# Patient Record
Sex: Female | Born: 1960 | Hispanic: Refuse to answer | Marital: Married
Health system: Midwestern US, Community
[De-identification: ages and names within clinical notes are randomized; demographics above are authoritative.]

## PROBLEM LIST (undated history)

## (undated) DIAGNOSIS — K219 Gastro-esophageal reflux disease without esophagitis: Secondary | ICD-10-CM

## (undated) DIAGNOSIS — M722 Plantar fascial fibromatosis: Secondary | ICD-10-CM

## (undated) DIAGNOSIS — Z720 Tobacco use: Secondary | ICD-10-CM

## (undated) DIAGNOSIS — G629 Polyneuropathy, unspecified: Secondary | ICD-10-CM

## (undated) DIAGNOSIS — J40 Bronchitis, not specified as acute or chronic: Secondary | ICD-10-CM

## (undated) DIAGNOSIS — K5792 Diverticulitis of intestine, part unspecified, without perforation or abscess without bleeding: Secondary | ICD-10-CM

## (undated) DIAGNOSIS — F419 Anxiety disorder, unspecified: Secondary | ICD-10-CM

## (undated) DIAGNOSIS — E119 Type 2 diabetes mellitus without complications: Secondary | ICD-10-CM

## (undated) DIAGNOSIS — F1910 Other psychoactive substance abuse, uncomplicated: Secondary | ICD-10-CM

## (undated) DIAGNOSIS — R51 Headache: Secondary | ICD-10-CM

## (undated) DIAGNOSIS — R519 Headache, unspecified: Secondary | ICD-10-CM

## (undated) DIAGNOSIS — M797 Fibromyalgia: Secondary | ICD-10-CM

## (undated) DIAGNOSIS — G5603 Carpal tunnel syndrome, bilateral upper limbs: Secondary | ICD-10-CM

## (undated) DIAGNOSIS — E785 Hyperlipidemia, unspecified: Secondary | ICD-10-CM

## (undated) HISTORY — DX: Diverticulitis of intestine, part unspecified, without perforation or abscess without bleeding: K57.92

## (undated) HISTORY — DX: Other psychoactive substance abuse, uncomplicated: F19.10

## (undated) HISTORY — DX: Carpal tunnel syndrome, bilateral upper limbs: G56.03

## (undated) HISTORY — DX: Fibromyalgia: M79.7

## (undated) HISTORY — PX: OTHER SURGICAL HISTORY: SHX169

## (undated) HISTORY — PX: ANKLE SURGERY: SHX546

## (undated) HISTORY — DX: Anxiety disorder, unspecified: F41.9

## (undated) HISTORY — DX: Polyneuropathy, unspecified: G62.9

## (undated) HISTORY — DX: Hyperlipidemia, unspecified: E78.5

## (undated) HISTORY — DX: Plantar fascial fibromatosis: M72.2

## (undated) HISTORY — PX: EYE SURGERY: SHX253

## (undated) HISTORY — PX: BACK SURGERY: SHX140

## (undated) HISTORY — PX: CARPAL TUNNEL RELEASE: SHX101

## (undated) HISTORY — PX: ECTOPIC PREGNANCY SURGERY: SHX613

---
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
## 2000-11-03 ENCOUNTER — Encounter: Payer: Self-pay | Admitting: Emergency Medicine

## 2000-11-03 ENCOUNTER — Inpatient Hospital Stay (HOSPITAL_COMMUNITY): Admission: EM | Admit: 2000-11-03 | Discharge: 2000-11-06 | Payer: Self-pay | Admitting: *Deleted

## 2000-11-06 ENCOUNTER — Encounter: Payer: Self-pay | Admitting: Emergency Medicine

## 2000-11-06 ENCOUNTER — Inpatient Hospital Stay (HOSPITAL_COMMUNITY): Admission: EM | Admit: 2000-11-06 | Discharge: 2000-11-16 | Payer: Self-pay | Admitting: Emergency Medicine

## 2000-11-07 ENCOUNTER — Encounter: Payer: Self-pay | Admitting: Family Medicine

## 2000-11-08 ENCOUNTER — Encounter: Payer: Self-pay | Admitting: Internal Medicine

## 2000-11-08 ENCOUNTER — Encounter: Payer: Self-pay | Admitting: Family Medicine

## 2000-11-14 ENCOUNTER — Encounter: Payer: Self-pay | Admitting: Family Medicine

## 2000-11-14 ENCOUNTER — Encounter (HOSPITAL_BASED_OUTPATIENT_CLINIC_OR_DEPARTMENT_OTHER): Payer: Self-pay | Admitting: General Surgery

## 2001-09-16 ENCOUNTER — Emergency Department (HOSPITAL_COMMUNITY): Admission: EM | Admit: 2001-09-16 | Discharge: 2001-09-16 | Payer: Self-pay | Admitting: Emergency Medicine

## 2002-01-08 ENCOUNTER — Ambulatory Visit (HOSPITAL_COMMUNITY): Admission: RE | Admit: 2002-01-08 | Discharge: 2002-01-08 | Payer: Self-pay | Admitting: Obstetrics and Gynecology

## 2002-01-08 ENCOUNTER — Encounter: Payer: Self-pay | Admitting: Obstetrics and Gynecology

## 2002-09-10 ENCOUNTER — Encounter: Payer: Self-pay | Admitting: Obstetrics and Gynecology

## 2002-09-10 ENCOUNTER — Ambulatory Visit (HOSPITAL_COMMUNITY): Admission: RE | Admit: 2002-09-10 | Discharge: 2002-09-10 | Payer: Self-pay | Admitting: Obstetrics and Gynecology

## 2004-02-23 ENCOUNTER — Encounter (INDEPENDENT_AMBULATORY_CARE_PROVIDER_SITE_OTHER): Payer: Self-pay | Admitting: Specialist

## 2004-02-23 ENCOUNTER — Observation Stay (HOSPITAL_COMMUNITY): Admission: RE | Admit: 2004-02-23 | Discharge: 2004-02-24 | Payer: Self-pay | Admitting: Specialist

## 2004-09-28 ENCOUNTER — Ambulatory Visit: Payer: Self-pay | Admitting: Internal Medicine

## 2005-08-17 ENCOUNTER — Ambulatory Visit: Payer: Self-pay | Admitting: Internal Medicine

## 2005-08-22 ENCOUNTER — Ambulatory Visit: Payer: Self-pay | Admitting: Internal Medicine

## 2005-09-05 ENCOUNTER — Encounter: Admission: RE | Admit: 2005-09-05 | Discharge: 2005-09-05 | Payer: Self-pay | Admitting: Obstetrics and Gynecology

## 2005-11-03 ENCOUNTER — Ambulatory Visit: Payer: Self-pay | Admitting: Family Medicine

## 2006-09-09 ENCOUNTER — Ambulatory Visit (HOSPITAL_COMMUNITY): Admission: RE | Admit: 2006-09-09 | Discharge: 2006-09-09 | Payer: Self-pay | Admitting: Internal Medicine

## 2006-09-25 ENCOUNTER — Emergency Department (HOSPITAL_COMMUNITY): Admission: EM | Admit: 2006-09-25 | Discharge: 2006-09-25 | Payer: Self-pay | Admitting: Emergency Medicine

## 2006-10-04 ENCOUNTER — Emergency Department (HOSPITAL_COMMUNITY): Admission: EM | Admit: 2006-10-04 | Discharge: 2006-10-04 | Payer: Self-pay | Admitting: Emergency Medicine

## 2006-10-11 ENCOUNTER — Ambulatory Visit: Payer: Self-pay | Admitting: Internal Medicine

## 2006-10-11 LAB — CONVERTED CEMR LAB
ALT: 21 units/L (ref 0–40)
AST: 23 units/L (ref 0–37)
Albumin: 3.5 g/dL (ref 3.5–5.2)
Alkaline Phosphatase: 86 units/L (ref 39–117)
Amylase: 72 units/L (ref 27–131)
BUN: 14 mg/dL (ref 6–23)
Basophils Absolute: 0.1 10*3/uL (ref 0.0–0.1)
Basophils Relative: 0.8 % (ref 0.0–1.0)
Bilirubin Urine: NEGATIVE
Bilirubin, Direct: 0.1 mg/dL (ref 0.0–0.3)
CO2: 29 meq/L (ref 19–32)
Calcium: 9 mg/dL (ref 8.4–10.5)
Chloride: 109 meq/L (ref 96–112)
Cholesterol: 195 mg/dL (ref 0–200)
Creatinine, Ser: 0.8 mg/dL (ref 0.4–1.2)
Direct LDL: 106.4 mg/dL
Eosinophils Absolute: 0.2 10*3/uL (ref 0.0–0.6)
Eosinophils Relative: 3.2 % (ref 0.0–5.0)
GFR calc Af Amer: 99 mL/min
GFR calc non Af Amer: 82 mL/min
Glucose, Bld: 95 mg/dL (ref 70–99)
H Pylori IgG: NEGATIVE
HCT: 37.7 % (ref 36.0–46.0)
HDL: 54.9 mg/dL (ref >39.0)
Hemoglobin, Urine: NEGATIVE
Hemoglobin: 12.6 g/dL (ref 12.0–15.0)
Ketones, ur: NEGATIVE mg/dL
Leukocytes, UA: NEGATIVE
Lipase: 21 units/L (ref 11.0–59.0)
Lymphocytes Relative: 35.1 % (ref 12.0–46.0)
MCHC: 33.3 g/dL (ref 30.0–36.0)
MCV: 82.5 fL (ref 78.0–100.0)
Monocytes Absolute: 0.6 10*3/uL (ref 0.2–0.7)
Monocytes Relative: 9.7 % (ref 3.0–11.0)
Neutro Abs: 3.3 10*3/uL (ref 1.4–7.7)
Neutrophils Relative %: 51.2 % (ref 43.0–77.0)
Nitrite: NEGATIVE
Platelets: 259 10*3/uL (ref 150–400)
Potassium: 3.7 meq/L (ref 3.5–5.1)
RBC: 4.57 M/uL (ref 3.87–5.11)
RDW: 14.2 % (ref 11.5–14.6)
Sodium: 143 meq/L (ref 135–145)
Specific Gravity, Urine: >=1.03 (ref 1.000–1.03)
TSH: 0.96 microintl units/mL (ref 0.35–5.50)
Total Bilirubin: 0.6 mg/dL (ref 0.3–1.2)
Total CHOL/HDL Ratio: 3.6
Total Protein: 6.6 g/dL (ref 6.0–8.3)
Triglycerides: 202 mg/dL (ref 0–149)
Urine Glucose: NEGATIVE mg/dL
Urobilinogen, UA: 0.2 (ref 0.0–1.0)
VLDL: 40 mg/dL (ref 0–40)
WBC: 6.5 10*3/uL (ref 4.5–10.5)
pH: 6 (ref 5.0–8.0)

## 2006-12-26 DIAGNOSIS — K219 Gastro-esophageal reflux disease without esophagitis: Secondary | ICD-10-CM | POA: Insufficient documentation

## 2006-12-26 DIAGNOSIS — F329 Major depressive disorder, single episode, unspecified: Secondary | ICD-10-CM | POA: Insufficient documentation

## 2006-12-26 DIAGNOSIS — F3289 Other specified depressive episodes: Secondary | ICD-10-CM | POA: Insufficient documentation

## 2008-02-01 ENCOUNTER — Emergency Department (HOSPITAL_COMMUNITY): Admission: EM | Admit: 2008-02-01 | Discharge: 2008-02-01 | Payer: Self-pay | Admitting: Emergency Medicine

## 2008-02-03 ENCOUNTER — Encounter: Payer: Self-pay | Admitting: Internal Medicine

## 2008-02-09 ENCOUNTER — Ambulatory Visit (HOSPITAL_COMMUNITY): Admission: RE | Admit: 2008-02-09 | Discharge: 2008-02-10 | Payer: Self-pay | Admitting: Orthopaedic Surgery

## 2008-03-05 ENCOUNTER — Ambulatory Visit (HOSPITAL_BASED_OUTPATIENT_CLINIC_OR_DEPARTMENT_OTHER): Admission: RE | Admit: 2008-03-05 | Discharge: 2008-03-05 | Payer: Self-pay | Admitting: Orthopaedic Surgery

## 2008-08-19 ENCOUNTER — Ambulatory Visit: Payer: Self-pay | Admitting: Family Medicine

## 2008-08-19 LAB — CONVERTED CEMR LAB
ALT: 18 units/L (ref 0–35)
AST: 13 units/L (ref 0–37)
Albumin: 4.2 g/dL (ref 3.5–5.2)
Alkaline Phosphatase: 103 units/L (ref 39–117)
BUN: 11 mg/dL (ref 6–23)
Band Neutrophils: 0 % (ref 0–10)
Basophils Absolute: 0 10*3/uL (ref 0.0–0.1)
Basophils Relative: 1 % (ref 0–1)
CO2: 24 meq/L (ref 19–32)
Calcium: 9.2 mg/dL (ref 8.4–10.5)
Chloride: 105 meq/L (ref 96–112)
Cholesterol: 209 mg/dL — ABNORMAL HIGH (ref 0–200)
Creatinine, Ser: 0.78 mg/dL (ref 0.40–1.20)
Eosinophils Absolute: 0.1 10*3/uL (ref 0.0–0.7)
Eosinophils Relative: 2 % (ref 0–5)
Free T4: 1.05 ng/dL (ref 0.89–1.80)
Glucose, Bld: 98 mg/dL (ref 70–99)
HCT: 41.6 % (ref 36.0–46.0)
HDL: 61 mg/dL (ref >39)
Hemoglobin: 12.8 g/dL (ref 12.0–15.0)
LDL Cholesterol: 114 mg/dL — ABNORMAL HIGH (ref 0–99)
Lymphocytes Relative: 40 % (ref 12–46)
Lymphs Abs: 1.8 10*3/uL (ref 0.7–4.0)
MCHC: 30.8 g/dL (ref 30.0–36.0)
MCV: 84.4 fL (ref 78.0–100.0)
Monocytes Absolute: 0.5 10*3/uL (ref 0.1–1.0)
Monocytes Relative: 10 % (ref 3–12)
Neutro Abs: 2.2 10*3/uL (ref 1.7–7.7)
Neutrophils Relative %: 47 % (ref 43–77)
Platelets: 243 10*3/uL (ref 150–400)
Potassium: 4 meq/L (ref 3.5–5.3)
RBC: 4.93 M/uL (ref 3.87–5.11)
RDW: 16.1 % — ABNORMAL HIGH (ref 11.5–15.5)
Sodium: 140 meq/L (ref 135–145)
T3, Total: 130.2 ng/dL (ref 80.0–204.0)
TSH: 0.96 microintl units/mL (ref 0.350–4.500)
Total Bilirubin: 0.3 mg/dL (ref 0.3–1.2)
Total CHOL/HDL Ratio: 3.4
Total Protein: 7.1 g/dL (ref 6.0–8.3)
Triglycerides: 168 mg/dL — ABNORMAL HIGH (ref <150)
VLDL: 34 mg/dL (ref 0–40)
WBC: 4.6 10*3/uL (ref 4.0–10.5)

## 2008-08-31 ENCOUNTER — Ambulatory Visit: Payer: Self-pay | Admitting: Family Medicine

## 2008-09-07 ENCOUNTER — Ambulatory Visit (HOSPITAL_COMMUNITY): Admission: RE | Admit: 2008-09-07 | Discharge: 2008-09-07 | Payer: Self-pay | Admitting: Family Medicine

## 2008-12-16 ENCOUNTER — Ambulatory Visit: Payer: Self-pay | Admitting: Family Medicine

## 2008-12-22 ENCOUNTER — Ambulatory Visit (HOSPITAL_COMMUNITY): Admission: RE | Admit: 2008-12-22 | Discharge: 2008-12-22 | Payer: Self-pay | Admitting: Family Medicine

## 2008-12-28 ENCOUNTER — Ambulatory Visit: Payer: Self-pay | Admitting: *Deleted

## 2009-01-21 ENCOUNTER — Ambulatory Visit: Payer: Self-pay | Admitting: Family Medicine

## 2009-03-15 ENCOUNTER — Ambulatory Visit: Payer: Self-pay | Admitting: Family Medicine

## 2009-03-15 ENCOUNTER — Telehealth (INDEPENDENT_AMBULATORY_CARE_PROVIDER_SITE_OTHER): Payer: Self-pay | Admitting: *Deleted

## 2009-03-23 ENCOUNTER — Encounter: Payer: Self-pay | Admitting: Obstetrics and Gynecology

## 2009-03-23 ENCOUNTER — Ambulatory Visit: Payer: Self-pay | Admitting: Obstetrics and Gynecology

## 2009-03-23 LAB — CONVERTED CEMR LAB
FSH: 49.9 milliintl units/mL
LH: 45.5 milliintl units/mL

## 2009-03-29 ENCOUNTER — Ambulatory Visit (HOSPITAL_COMMUNITY): Admission: RE | Admit: 2009-03-29 | Discharge: 2009-03-29 | Payer: Self-pay | Admitting: Obstetrics & Gynecology

## 2009-03-29 ENCOUNTER — Ambulatory Visit: Payer: Self-pay | Admitting: Family Medicine

## 2009-03-29 ENCOUNTER — Telehealth (INDEPENDENT_AMBULATORY_CARE_PROVIDER_SITE_OTHER): Payer: Self-pay | Admitting: *Deleted

## 2009-04-08 ENCOUNTER — Ambulatory Visit: Payer: Self-pay | Admitting: Obstetrics & Gynecology

## 2009-04-21 ENCOUNTER — Ambulatory Visit (HOSPITAL_COMMUNITY): Admission: RE | Admit: 2009-04-21 | Discharge: 2009-04-21 | Payer: Self-pay | Admitting: Family Medicine

## 2009-04-21 ENCOUNTER — Ambulatory Visit: Admission: RE | Admit: 2009-04-21 | Discharge: 2009-04-21 | Payer: Self-pay | Admitting: Gynecologic Oncology

## 2009-04-21 ENCOUNTER — Encounter: Payer: Self-pay | Admitting: Gynecologic Oncology

## 2009-05-03 ENCOUNTER — Telehealth (INDEPENDENT_AMBULATORY_CARE_PROVIDER_SITE_OTHER): Payer: Self-pay | Admitting: *Deleted

## 2009-05-04 ENCOUNTER — Ambulatory Visit: Payer: Self-pay | Admitting: Internal Medicine

## 2009-08-09 ENCOUNTER — Ambulatory Visit: Payer: Self-pay | Admitting: Family Medicine

## 2009-09-06 ENCOUNTER — Ambulatory Visit: Payer: Self-pay | Admitting: Family Medicine

## 2009-09-07 ENCOUNTER — Encounter (INDEPENDENT_AMBULATORY_CARE_PROVIDER_SITE_OTHER): Payer: Self-pay | Admitting: Adult Health

## 2009-09-27 ENCOUNTER — Ambulatory Visit (HOSPITAL_COMMUNITY): Admission: RE | Admit: 2009-09-27 | Discharge: 2009-09-27 | Payer: Self-pay | Admitting: Gynecologic Oncology

## 2009-09-29 ENCOUNTER — Ambulatory Visit: Admission: RE | Admit: 2009-09-29 | Discharge: 2009-09-29 | Payer: Self-pay | Admitting: Gynecologic Oncology

## 2009-11-11 ENCOUNTER — Ambulatory Visit: Payer: Self-pay | Admitting: Family Medicine

## 2009-11-11 LAB — CONVERTED CEMR LAB
ALT: 20 units/L (ref 0–35)
AST: 19 units/L (ref 0–37)
Albumin: 4.3 g/dL (ref 3.5–5.2)
Alkaline Phosphatase: 107 units/L (ref 39–117)
BUN: 15 mg/dL (ref 6–23)
Basophils Absolute: 0 10*3/uL (ref 0.0–0.1)
Basophils Relative: 1 % (ref 0–1)
CO2: 22 meq/L (ref 19–32)
Calcium: 9.3 mg/dL (ref 8.4–10.5)
Chloride: 102 meq/L (ref 96–112)
Cholesterol: 211 mg/dL — ABNORMAL HIGH (ref 0–200)
Creatinine, Ser: 0.7 mg/dL (ref 0.40–1.20)
Eosinophils Absolute: 0.1 10*3/uL (ref 0.0–0.7)
Eosinophils Relative: 2 % (ref 0–5)
Glucose, Bld: 126 mg/dL — ABNORMAL HIGH (ref 70–99)
HCT: 41.2 % (ref 36.0–46.0)
HDL: 64 mg/dL (ref >39)
Helicobacter Pylori Antibody-IgG: 0.8
Hemoglobin: 13.1 g/dL (ref 12.0–15.0)
LDL Cholesterol: 117 mg/dL — ABNORMAL HIGH (ref 0–99)
Lymphocytes Relative: 41 % (ref 12–46)
Lymphs Abs: 2.3 10*3/uL (ref 0.7–4.0)
MCHC: 31.8 g/dL (ref 30.0–36.0)
MCV: 81.1 fL (ref 78.0–100.0)
Monocytes Absolute: 0.5 10*3/uL (ref 0.1–1.0)
Monocytes Relative: 8 % (ref 3–12)
Neutro Abs: 2.7 10*3/uL (ref 1.7–7.7)
Neutrophils Relative %: 48 % (ref 43–77)
Platelets: 271 10*3/uL (ref 150–400)
Potassium: 4.2 meq/L (ref 3.5–5.3)
RBC: 5.08 M/uL (ref 3.87–5.11)
RDW: 15.6 % — ABNORMAL HIGH (ref 11.5–15.5)
Sodium: 135 meq/L (ref 135–145)
Total Bilirubin: 0.3 mg/dL (ref 0.3–1.2)
Total CHOL/HDL Ratio: 3.3
Total Protein: 7.3 g/dL (ref 6.0–8.3)
Triglycerides: 151 mg/dL — ABNORMAL HIGH (ref <150)
VLDL: 30 mg/dL (ref 0–40)
Vit D, 25-Hydroxy: 33 ng/mL (ref 30–89)
WBC: 5.6 10*3/uL (ref 4.0–10.5)

## 2009-12-01 ENCOUNTER — Ambulatory Visit: Payer: Self-pay | Admitting: Obstetrics & Gynecology

## 2009-12-01 ENCOUNTER — Ambulatory Visit (HOSPITAL_COMMUNITY): Admission: RE | Admit: 2009-12-01 | Discharge: 2009-12-01 | Payer: Self-pay | Admitting: Family Medicine

## 2009-12-01 LAB — CONVERTED CEMR LAB
Trich, Wet Prep: NONE SEEN
WBC, Wet Prep HPF POC: NONE SEEN
Yeast Wet Prep HPF POC: NONE SEEN

## 2009-12-02 ENCOUNTER — Encounter: Payer: Self-pay | Admitting: Obstetrics & Gynecology

## 2009-12-02 LAB — CONVERTED CEMR LAB
Chlamydia, DNA Probe: NEGATIVE
GC Probe Amp, Genital: NEGATIVE

## 2009-12-26 ENCOUNTER — Ambulatory Visit: Payer: Self-pay | Admitting: Obstetrics & Gynecology

## 2009-12-26 ENCOUNTER — Ambulatory Visit (HOSPITAL_COMMUNITY): Admission: RE | Admit: 2009-12-26 | Discharge: 2009-12-26 | Payer: Self-pay | Admitting: Obstetrics & Gynecology

## 2010-01-23 ENCOUNTER — Encounter (INDEPENDENT_AMBULATORY_CARE_PROVIDER_SITE_OTHER): Payer: Self-pay | Admitting: *Deleted

## 2010-01-23 ENCOUNTER — Ambulatory Visit: Payer: Self-pay | Admitting: Obstetrics & Gynecology

## 2010-03-09 ENCOUNTER — Encounter (INDEPENDENT_AMBULATORY_CARE_PROVIDER_SITE_OTHER): Payer: Self-pay | Admitting: *Deleted

## 2010-03-09 ENCOUNTER — Ambulatory Visit: Payer: Self-pay | Admitting: Internal Medicine

## 2010-03-09 DIAGNOSIS — R141 Gas pain: Secondary | ICD-10-CM | POA: Insufficient documentation

## 2010-03-09 DIAGNOSIS — R143 Flatulence: Secondary | ICD-10-CM | POA: Insufficient documentation

## 2010-03-09 DIAGNOSIS — R142 Eructation: Secondary | ICD-10-CM

## 2010-03-09 DIAGNOSIS — R1319 Other dysphagia: Secondary | ICD-10-CM | POA: Insufficient documentation

## 2010-03-09 DIAGNOSIS — D259 Leiomyoma of uterus, unspecified: Secondary | ICD-10-CM | POA: Insufficient documentation

## 2010-03-09 DIAGNOSIS — R12 Heartburn: Secondary | ICD-10-CM | POA: Insufficient documentation

## 2010-03-09 HISTORY — DX: Leiomyoma of uterus, unspecified: D25.9

## 2010-03-23 ENCOUNTER — Ambulatory Visit: Payer: Self-pay | Admitting: Internal Medicine

## 2010-03-28 ENCOUNTER — Telehealth: Payer: Self-pay | Admitting: Internal Medicine

## 2010-03-30 ENCOUNTER — Telehealth: Payer: Self-pay | Admitting: Internal Medicine

## 2010-07-02 ENCOUNTER — Encounter: Payer: Self-pay | Admitting: Orthopedic Surgery

## 2010-07-11 NOTE — Progress Notes (Signed)
Summary: Paperwork  Phone Note Call from Patient Call back at [facility] Phone [phone]   Caller: Patient Call For: Dr. Leone Payor Reason for Call: Talk to Nurse Summary of Call: pt. wants to know if you received a fax about [employer] Initial call taken by: Karna Christmas,  March 28, 2010 9:50 AM  Follow-up for Phone Call        Per Elease Hashimoto in MR, she does have forms, but she needs HIPPA form signed by pt.  She will mail this form.  Pt is notified that Elease Hashimoto will mail her form to sign and return to [place].  Pt is agreeable. Follow-up by: Francee Piccolo CMA Duncan Dull),  March 28, 2010 11:22 AM

## 2010-07-11 NOTE — Progress Notes (Signed)
Summary: form ?'s   Phone Note Call from Patient Call back at Home Phone [phone]   Caller: Patient Call For: Dr. Leone Payor Reason for Call: Talk to Nurse Summary of Call: has questions regarding accident claim form and $25 fee Initial call taken by: Vallarie Mare,  March 30, 2010 1:51 PM  Follow-up for Phone Call        I advised pt that [place] has a blanket fee for all forms to be filled out and there really is nothing I can do about it.  Pt is agreeable. Follow-up by: Francee Piccolo CMA Duncan Dull),  March 30, 2010 3:47 PM

## 2010-07-11 NOTE — Procedures (Signed)
Summary: Colonoscopy  Patient: Alyssa Montgomery Note: All result statuses are Final unless otherwise noted.  Tests: (1) Colonoscopy (COL)   COL Colonoscopy           DONE     [facility]     [address]     [place], [place]  [address]           COLONOSCOPY PROCEDURE REPORT           PATIENT:  [name], [name]  MR#:  [id number]     BIRTHDATE:  12/22/1960, 49 yrs. old  GENDER:  female     ENDOSCOPIST:  Iva Boop, MD, [facility]           PROCEDURE DATE:  03/23/2010     PROCEDURE:  Colonoscopy 21308     ASA CLASS:  Class II     INDICATIONS:  Routine Risk Screening     MEDICATIONS:   Fentanyl 25 mcg IV, Versed 3 mg IV, There was     residual sedation effect present from prior procedure.           DESCRIPTION OF PROCEDURE:   After the risks benefits and     alternatives of the procedure were thoroughly explained, informed     consent was obtained.  No rectal exam performed. The LB 180AL     [id number] endoscope was introduced through the anus and advanced to     the cecum, which was identified by both the appendix and ileocecal     valve, without limitations.  The quality of the prep was     excellent, using MoviPrep.  The instrument was then slowly     withdrawn as the colon was fully examined. insertion: 2:33 minutes     and Withdrawal: 8:00 minutes     <<PROCEDUREIMAGES>>           FINDINGS:  Mild diverticulosis was found in the sigmoid colon.     This was otherwise a normal examination of the colon.   Retroflexed     views in the rectum revealed no abnormalities.    The scope was     then withdrawn from the patient and the procedure completed.           COMPLICATIONS:  None     ENDOSCOPIC IMPRESSION:     1) Mild diverticulosis in the sigmoid colon     2) Otherwise normal examination, excellent prep           REPEAT EXAM:  In 10 year(s) for routine screening colonoscopy.           Iva Boop, MD, [facility]           CC:  Dow Adolph, MD     The Patient          n.     eSIGNED:   Iva Boop at 03/23/2010 04:19 PM           Butch Penny, [id number]  Note: An exclamation mark (!) indicates a result that was not dispersed into the flowsheet. Document Creation Date: 03/23/2010 4:19 PM _______________________________________________________________________  (1) Order result status: Final Collection or observation date-time: 03/23/2010 15:58 Requested date-time:  Receipt date-time:  Reported date-time:  Referring Physician:   Ordering Physician: Stan Head [id number]) Specimen Source:  Source: Launa Grill Order Number: [id number] Lab site:   Appended Document: Colonoscopy    Clinical Lists Changes  Observations: Added new observation of COLONNXTDUE: 03/2020 (03/23/2010 16:25)

## 2010-07-11 NOTE — Procedures (Signed)
Summary: Upper Endoscopy  Patient: Alyssa Montgomery Note: All result statuses are Final unless otherwise noted.  Tests: (1) Upper Endoscopy (EGD)   EGD Upper Endoscopy       DONE     [facility]     [address]     [place], [place]  [address]           ENDOSCOPY PROCEDURE REPORT           PATIENT:  [name], [name]  MR#:  [id number]     BIRTHDATE:  [birth year], 49 yrs. old  GENDER:  female           ENDOSCOPIST:  Iva Boop, MD, [facility]           PROCEDURE DATE:  03/23/2010     PROCEDURE:  EGD, diagnostic, Maloney Dilation of Esophagus     ASA CLASS:  Class II     INDICATIONS:  dysphagia           MEDICATIONS:   Fentanyl 75 mcg IV, Versed 7 mg IV     TOPICAL ANESTHETIC:  Exactacain Spray           DESCRIPTION OF PROCEDURE:   After the risks benefits and     alternatives of the procedure were thoroughly explained, informed     consent was obtained.  The LB GIF-H180 [id number] endoscope was     introduced through the mouth and advanced to the second portion of     the duodenum, without limitations.  The instrument was slowly     withdrawn as the mucosa was fully examined.     <<PROCEDUREIMAGES>>           An erosion was found at the gastroesophageal junction. One small     erosion.  Otherwise the examination was normal.    Retroflexed     views revealed no abnormalities.    The scope was then withdrawn     from the patient, a 73 [place] Maloney dilator was passed easily     and without heme on the dilator.The procedure was completed.           COMPLICATIONS:  None           ENDOSCOPIC IMPRESSION:     1) Erosion at the gastroesophageal junction     2) Otherwise normal examination - 54 French maloney dilation of     the esophagus     RECOMMENDATIONS:     Clear liquids until 5 PM then soft foods today.     Normal foods tomorrow.     Be sure to take Prilosec (omeprazole) every day.     Follow-up with Dr. Leone Payor as needed           REPEAT EXAM:  as needed        Iva Boop, MD, [facility]           CC:  The Patient     Dow Adolph, MD           n.     Rosalie Doctor:   Iva Boop at 03/23/2010 04:13 PM           Butch Penny, [id number]  Note: An exclamation mark (!) indicates a result that was not dispersed into the flowsheet. Document Creation Date: 03/23/2010 4:14 PM _______________________________________________________________________  (1) Order result status: Final Collection or observation date-time: 03/23/2010 15:38 Requested date-time:  Receipt date-time:  Reported date-time:  Referring Physician:   Ordering Physician: Stan Head (  [id number]) Specimen Source:  Source: Launa Grill Order Number: [id number] Lab site:   Appended Document: Upper Endoscopy

## 2010-07-11 NOTE — Letter (Signed)
Summary: New Patient letter  [facility]  [address] [place], [place] [address]   Phone: [phone]  Fax: [phone]       01/23/2010 MRN: [id number]  Alyssa Montgomery [address] [place], [place]  [address]  Dear Alyssa Montgomery,  Welcome to the Gastroenterology Division at [facility].    You are scheduled to see Dr.  Leone Payor on 03-09-10 at 9:00a.m. on the 3rd floor at [facility], [address]  We ask that you try to arrive at our office 15 minutes prior to your appointment time to allow for check-in.  We would like you to complete the enclosed self-administered evaluation form prior to your visit and bring it with you on the day of your appointment.  We will review it with you.  Also, please bring a complete list of all your medications or, if you prefer, bring the medication bottles and we will list them.  Please bring your insurance card so that we may make a copy of it.  If your insurance requires a referral to see a specialist, please bring your referral form from your primary care physician.  Co-payments are due at the time of your visit and may be paid by cash, check or credit card.     Your office visit will consist of a consult with your physician (includes a physical exam), any laboratory testing he/she may order, scheduling of any necessary diagnostic testing (e.g. x-ray, ultrasound, CT-scan), and scheduling of a procedure (e.g. Endoscopy, Colonoscopy) if required.  Please allow enough time on your schedule to allow for any/all of these possibilities.    If you cannot keep your appointment, please call [phone] to cancel or reschedule prior to your appointment date.  This allows [place] the opportunity to schedule an appointment for another patient in need of care.  If you do not cancel or reschedule by 5 p.m. the business day prior to your appointment date, you will be charged a $50.00 late cancellation/no-show fee.    Thank you for choosing  [facility] for your medical needs.  We appreciate the opportunity to care for you.  Please visit [place] at our website  to learn more about our practice.                     Sincerely,                                                             The Gastroenterology Division

## 2010-07-11 NOTE — Assessment & Plan Note (Signed)
Summary: GERD--ch.    History of Present Illness Visit Type: Initial Consult Primary GI MD: Stan Head MD [facility] Primary Provider: Dow Adolph, MD Requesting Provider: Jaynie Collins, MD Chief Complaint: Belching History of Present Illness:   Patient complains of belching that she can not control. She has sever gas and bloating with constipation. She has changed her diet to try to help with the bloating but nothing has changed. She does have some generalized abdominal pain that she can not decide if it is her bloating or her fibriod.  "I feel sluggish and sick, not nause but more bloated. Like time for period but its not." She had surgery for uterine fibroid but only able to remove a third of it.    For several months has had solid food dysphagia, most days. Heartburn and belching also, took nexium years ago but stopped due to cost. Stool consistency "not right" but canot really describe. defecates daily, ? incomplete sensation.   GI Review of Systems    Reports abdominal pain, acid reflux, belching, bloating, and  dysphagia with solids.     Location of  Abdominal pain: lower abdomen.    Denies chest pain, dysphagia with liquids, heartburn, loss of appetite, nausea, vomiting, vomiting blood, weight loss, and  weight gain.      Reports change in bowel habits and  constipation.     Denies anal fissure, black tarry stools, diarrhea, diverticulosis, fecal incontinence, heme positive stool, hemorrhoids, irritable bowel syndrome, jaundice, light color stool, liver problems, rectal bleeding, and  rectal pain. Preventive Screening-Counseling & Management  Alcohol-Tobacco     Smoking Status: current      Drug Use:  no.      Current Medications (verified): 1)  Tylenol 325 Mg Tabs (Acetaminophen) .... Take One By Mouth Once Daily 2)  Flexeril 10 Mg Tabs (Cyclobenzaprine Hcl) .... Take One By Mouth As Needed  Allergies: 1)  ! Pcn 2)  ! 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 year ectopic pregnancy back surgery ankle surgery right  Family History: Family History of Heart Disease:  maternal grandparents Lung Cancer: father (smoker)  Social History: Occupation: CNA Patient currently smokes. one pack every 3 days  Alcohol Use - no sober since 2003 Daily Caffeine Use Illicit Drug Use - no clean 2003 Drug Use:  no  Review of Systems       The patient complains of anxiety-new, arthritis/joint pain, back pain, fatigue, night sweats, sleeping problems, and urine leakage.         All other ROS negative except as per HPI.   Vital Signs:  Patient profile:   50 year old female Height:      65 inches Weight:      219.2 pounds BMI:     36.61 Pulse rate:   80 / minute Pulse rhythm:   regular BP sitting:   118 / 80  (left arm) Cuff size:   regular  Vitals Entered By: Harlow Mares CMA Duncan Dull) (March 09, 2010 9:24 AM)  Physical Exam  General:  Well developed, well nourished, no acute distress.obese.   Eyes:  right eye prosthesis otherwise anicteric Mouth:  dentition fair no lesions oro and posterior pharynx Abdomen:  midline scar, BS+ soft, mildly  tender RLQ . LLQ no masses, hernia no HSM obese Rectal:  deferred until time of colonoscopy.   Extremities:  No clubbing, cyanosis, edema or deformities noted. Cervical Nodes:  No significant cervical or supraclavicular adenopathy. Psych:  Alert and cooperative. Normal mood and affect.   Impression & Recommendations:  Problem # 1:  DYSPHAGIA (ICD-787.29) Assessment New start PPI, EGD and possible esophageal dilation Risks, benefits,and indications of  endoscopic procedure(s) were reviewed with the patient and all questions answered.  Orders: Colon/Endo (Colon/Endo)  Problem # 2:  HEARTBURN (ICD-787.1) Assessment: New start PPI will need lifestyle (obesity) addressed but await work-up  Problem # 3:  ABDOMINAL BLOATING (ICD-787.3) Assessment: New Chronic and likely due to prior surgeries, adhesions await other work-up (endoscopic) and consider probiotic ? due to fibroid - doubt  Problem # 4:  SCREENING COLORECTAL-CANCER (ICD-V76.51) Assessment: New  Risks, benefits,and indications of endoscopic procedure(s) were reviewed with the patient and all questions answered.  Orders: Colon/Endo (Colon/Endo)  Problem # 5:  FIBROIDS, UTERUS (ICD-218.9) Assessment: Comment Only  Patient Instructions: 1)  Please pick up your medications at your pharmacy.  2)  We will see you at your procedure on 03/23/10. 3)  Please begin Prilosec OTC as written below. 4)  [facility] Patient Information Guide given to patient.  5)  Colonoscopy and Flexible Sigmoidoscopy brochure given.  6)  Upper Endoscopy with Dilatation brochure given.  7)  Copy sent to : Jaynie Collins, MD; Dow Adolph, MD 8)  The medication list was reviewed and reconciled.  All changed / newly prescribed medications were explained.  A complete medication list was provided to the patient / caregiver. Prescriptions: REGLAN 10 MG  TABS (METOCLOPRAMIDE HCL) As per prep instructions.  #2 x 0   Entered by:   Francee Piccolo CMA (AAMA)   Authorized by:   Iva Boop MD, [facility]   Signed by:   Francee Piccolo CMA (AAMA) on 03/09/2010   Method used:   Faxed to ...       [facility] (retail)       [address] [place], [place]  [address]       Ph: [phone]       Fax: [phone]   RxID:   [id number] DULCOLAX 5 MG  TBEC (BISACODYL) Day before procedure take 2 at 3pm and 2 at 8pm.  #4 x 0   Entered by:    Francee Piccolo CMA (AAMA)   Authorized by:   Iva Boop MD, [facility]   Signed by:   Francee Piccolo CMA (AAMA) on 03/09/2010   Method used:   Faxed to ...       [facility] (retail)       [address] [place], [place]  [address]       Ph: [phone]       Fax: [phone]   RxID:   [id number] MIRALAX   POWD (POLYETHYLENE GLYCOL 3350) As per prep  instructions.  #255gm x 0   Entered by:   Francee Piccolo CMA (AAMA)   Authorized by:   Iva Boop MD, [facility]   Signed by:   Francee Piccolo CMA (AAMA) on 03/09/2010   Method used:   Faxed to ...       [facility] (retail)       [address]  [place], [place]  [address]       Ph: [phone]       Fax: [phone]   RxID:   [id number] PRILOSEC OTC 20 MG TBEC (OMEPRAZOLE MAGNESIUM) 1 by mouth once daily  #30 x 11   Entered and Authorized by:   Iva Boop MD, [facility]   Signed by:   Iva Boop MD, FACG on 03/09/2010   Method used:   Faxed to ...       [facility] (retail)       [address] [place], [place]  [address]       Ph: [phone]       Fax: [phone]   RxID:   [id number]

## 2010-07-11 NOTE — Op Note (Signed)
Summary: Small Bowel Obstruction                         [facility]  Patient:    Alyssa Montgomery, Alyssa Montgomery                   MRN: [id number] Proc. Date: 11/08/00 Adm. Date:  [id number] Attending:  Judie Petit CC:         Lorelle Formosa, M.D.   Operative Report  PREOPERATIVE DIAGNOSIS:   Small-bowel obstruction.  POSTOPERATIVE DIAGNOSIS:  Small-bowel obstruction secondary to adhesions.  OPERATION:  Exploratory laparotomy with adhesiolysis.  SURGEON:  Mardene Celeste. Lurene Shadow, M.D.  ASSISTANT:  Marnee Spring. Wiliam Ke, M.D.  ANESTHESIA:  General.  INDICATIONS:  This patient is a 50 year old woman admitted with a four day history of abdomen distention, nausea and pain with left lower quadrant primarily.  She had no passage of flatus or bowel movements for the past four days.  Following admission, she was evaluated by a KUB which was consistent with a small bowel obstruction versus an ileus.  However, she showed no improvement over time, and a CT scan done of the abdomen showed high grade small bowel obstruction.  She is brought to the operating room now for exploratory laparotomy.  DESCRIPTION OF PROCEDURE:  Following the induction of anesthesia with the patient positioned supinely, the abdomen was prepped and draped routinely. Exploratory laparotomy was carried out through a midline incision, primarily lower abdomen, deepened through the skin and subcutaneous tissues and through the linea alba.  Upon entering the abdomen, the omentum from her previous was stuck to her anterior abdominal wall from her previous surgery and this was carefully lysed.  Large amounts of dilated small bowel were delivered into the operative field, and this was followed down to an area of normal caliber small bowel which was noticed to be blocked by multiple adhesions in the proximal ileum.  The adhesions were lysed thus freeing up the small bowel, and the small bowel was followed down to the  ileocecal valve and back up to the ligament of Trietz.  It was aspirated so as to have a tension free closure. All of the areas of dissection were checked for hemostasis and noted to be dry.  Sponge, instrument and sharp counts were verified.  The wounds were then closed in layers as follows.  The midline was closed with a running 6-0 Novofil and skin clips with staples.  A sterile dressing was applied, anesthetic reversed and the patient removed from the operating room to the recovery room in stable condition. DD:  11/08/00 TD:  11/08/00 Job: [id number]

## 2010-07-11 NOTE — Letter (Signed)
Summary: [facility] Instructions  [facility]  [address] [place], [place] [address]   Phone: [phone]  Fax: [phone]       Alyssa Montgomery    07-23-1960    MRN: [id number]      Procedure Day Dorna Bloom: Lenor Coffin, 03/23/10     Arrival Time: 2:00 PM     Procedure Time: 3:00 PM    Location of Procedure:                    _X_   Endoscopy Center (4th Floor)  PREPARATION FOR COLONOSCOPY WITH MIRALAX  Starting 5 days prior to your procedure 03/18/10 do not eat nuts, seeds, popcorn, corn, beans, peas,  salads, or any raw vegetables.  Do not take any fiber supplements (e.g. Metamucil, Citrucel, and Benefiber). ____________________________________________________________________________________________________ THE DAY BEFORE YOUR PROCEDURE         WEDNESDAY, 03/22/10  1   Drink clear liquids the entire day-NO SOLID FOOD  2   Do not drink anything colored red or purple.  Avoid juices with pulp.  No orange juice.  3   Drink at least 64 oz. (8 glasses) of fluid/clear liquids during the day to prevent dehydration and help the prep work efficiently.  CLEAR LIQUIDS INCLUDE: Water Jello Ice Popsicles Tea (sugar ok, no milk/cream) Powdered fruit flavored drinks Coffee (sugar ok, no milk/cream) Gatorade Juice: apple, white grape, white cranberry  Lemonade Clear bullion, consomm, broth Carbonated beverages (any kind) Strained chicken noodle soup Hard Candy  1   Mix the entire bottle of Miralax with 64 oz. of Gatorade/Powerade before bed and put in the refrigerator to chill.  THE DAY OF YOUR PROCEDURE     THURSDAY, 03/23/10  1   At 5:00 AM take 2 Dulcolax/Bisacodyl tablets.  2   At 6:30 AM take one Reglan/Metoclopramide tablet.  3  Starting at 7:00 AM drink one 8 oz glass of the Miralax mixture every 15-20 minutes until you have finished drinking the entire 64 oz.  You should finish drinking prep around 9:30 or 10:00 AM.  4   If you are nauseated, you may take the  2nd Reglan/Metoclopramide tablet at 8:30 AM.        5    At 10:00 AM take 2 more DULCOLAX/Bisacodyl tablets.  You may drink clear liquids until 1:00 PM  (2 HOURS BEFORE PROCEDURE).  MEDICATION INSTRUCTIONS  Unless otherwise instructed, you should take regular prescription medications with a small sip of water as early as possible the morning of your procedure.       OTHER INSTRUCTIONS  You will need a responsible adult at least 51 years of age to accompany you and drive you home.   This person must remain in the waiting room during your procedure.  Wear loose fitting clothing that is easily removed.  Leave jewelry and other valuables at home.  However, you may wish to bring a book to read or an iPod/MP3 player to listen to music as you wait for your procedure to start.  Remove all body piercing jewelry and leave at home.  Total time from sign-in until discharge is approximately 2-3 hours.  You should go home directly after your procedure and rest.  You can resume normal activities the day after your procedure.  The day of your procedure you should not:   Drive   Make legal decisions   Operate machinery   Drink alcohol   Return to work  You will receive specific  instructions about eating, activities and medications before you leave.   The above instructions have been reviewed and explained to me by   _______________________    I fully understand and can verbalize these instructions _____________________________ Date _______

## 2010-08-26 LAB — URINALYSIS, ROUTINE W REFLEX MICROSCOPIC
Bilirubin Urine: NEGATIVE
Glucose, UA: NEGATIVE mg/dL
Ketones, ur: NEGATIVE mg/dL
Leukocytes, UA: NEGATIVE
Nitrite: NEGATIVE
Protein, ur: NEGATIVE mg/dL
Specific Gravity, Urine: >1.03 — ABNORMAL HIGH (ref 1.005–1.030)
Urobilinogen, UA: 0.2 mg/dL (ref 0.0–1.0)
pH: 5.5 (ref 5.0–8.0)

## 2010-08-26 LAB — URINE MICROSCOPIC-ADD ON

## 2010-08-26 LAB — CBC
HCT: 38.7 % (ref 36.0–46.0)
Hemoglobin: 13 g/dL (ref 12.0–15.0)
MCH: 27.4 pg (ref 26.0–34.0)
MCHC: 33.6 g/dL (ref 30.0–36.0)
MCV: 81.5 fL (ref 78.0–100.0)
Platelets: 241 10*3/uL (ref 150–400)
RBC: 4.75 MIL/uL (ref 3.87–5.11)
RDW: 15.6 % — ABNORMAL HIGH (ref 11.5–15.5)
WBC: 5.1 10*3/uL (ref 4.0–10.5)

## 2010-08-26 LAB — TSH: TSH: 1.005 u[IU]/mL (ref 0.350–4.500)

## 2010-08-26 LAB — PROLACTIN: Prolactin: 5.3 ng/mL

## 2010-10-24 NOTE — Op Note (Signed)
[name], [name]                ACCOUNT NO.:  [id number]   MEDICAL RECORD NO.:  [id number]          PATIENT TYPE:  OIB   LOCATION:  5007                         FACILITY:  [facility]   PHYSICIAN:  Mark C. Ophelia Charter, M.D.    DATE OF BIRTH:  10/01/1960   DATE OF PROCEDURE:  02/09/2008  DATE OF DISCHARGE:                               OPERATIVE REPORT   PREOPERATIVE DIAGNOSIS:  Right fibular fracture with ankle instability.   POSTOPERATIVE DIAGNOSIS:  Right fibular fracture with ankle instability.   PROCEDURE:  1. Open reduction and internal fixation, right fibula.  2. Medial deltoid ligament repair.   SURGEON:  Mark C. Ophelia Charter, MD   ANESTHESIA:  General plus Marcaine skin local.   PROCEDURE:  After induction of general anesthesia, orotracheal  intubation, proximal thigh tourniquet application, and standard prep  with DuraPrep, preoperative vancomycin was given.  Regular stockinette  and extremity sheets and drapes were applied.  Surgical time-out  checklist was completed for the intraoperative portion.  Lateral skin  marker was used and incision was made laterally initially.  Subperiosteal dissection of the fibular fracture was performed.  It was  reduced anatomically, held with self-retaining clamps, and then  anatomically fixed with screws.  Distal screws were unicortical.  Proximal screws were bicortical.  A 12-mm and a 7-hole one-third tubular  plate was selected.  After fixation, the fibula was inspected.  It was  anatomic by x-ray and there was still widening of the medial clear  space.  Syndesmosis appeared slightly widened, but it did not appear  unstable and pulling on it with a towel clip as well as pushing on  pressure between the malleoli did not seem to change the syndesmosis  gap.  Incision was made medially over the deltoid ligament region.  Saphenous vein was preserved and deltoid ligament was inspected and the  deltoid ligament was folded back and sticking in the medial  clear space  between the dome of the talus and the medial malleolus.  Using a Glorious Peach,  this was flipped out.  The posterior tibial tendon was behind the medial  malleolus and was in normal position.  Along with a neurovascular bundle  and was only the deltoid ligament that was rolled up.  Once this was  pushed over, numerous views were taken, which showed that the medial  clear space was reduced.  The talus was over and sitting in good  position.  A 2-0 Vicryl suture was placed in the deltoid ligament to  make sure it would not flip back in between the joint.  Wound was  irrigated both medial and lateral and lateral incision was closed with 2-  0 Vicryl followed by skin staple closure.  Medial incision was  reapproximated with single Vicryl then closed with skin staple closure.  Instrument count and needle count was correct.  The patient tolerated  the procedure well and was transferred to the recovery room in stable  condition.      Mark C. Ophelia Charter, M.D.  Electronically Signed    MCY/MEDQ  D:  02/09/2008  T:  02/10/2008  Job:  [id number]

## 2010-10-24 NOTE — Op Note (Signed)
NAMEVENIDA, Alyssa Montgomery                ACCOUNT NO.:  [id number]   MEDICAL RECORD NO.:  [id number]          PATIENT TYPE:  AMB   LOCATION:  DSC                          FACILITY:  [facility]   PHYSICIAN:  Mark C. Ophelia Charter, M.D.    DATE OF BIRTH:  07/01/1960   DATE OF PROCEDURE:  03/05/2008  DATE OF DISCHARGE:                               OPERATIVE REPORT   PREOPERATIVE DIAGNOSIS:  Right ankle syndesmotic ligament rupture.   POSTOPERATIVE DIAGNOSIS:  Right ankle syndesmotic ligament rupture.   PROCEDURE:  Reduction and placement of syndesmotic screws x2.   SURGEON:  Mark C. Ophelia Charter, MD   ANESTHESIA:  General.   TOURNIQUET TIME:  Less than 30 minutes x350.   This 50 year old female had ankle injury 2 weeks ago and underwent ORIF  of the fibula.  She had the piece of the deltoid ligament flipped into  the joint, it was repaired, and stress x-rays were taken  intraoperatively checking the syndesmosis, which were normal.  The  patient returned to the office at 1 week postop and had a significant  wear in her splint, which she had changed and had widening of the  syndesmosis.  She was brought at this time for placement of a  syndesmotic screw for reduction of the ankle.  There was widening of the  medial clear space on x-ray, and importance of position of the ankle,  and the anatomy of the syndesmosis ligament was discussed  preoperatively.  Appropriate questions were elicited and answered.   After induction of anesthesia, the patient's leg had been marked.  Surgical checklist time-out was completed after prepping and draping.  The patient splint had been taken off.  She had re-rolled and rewrapped  it herself several times and complained of itching where she had been  scratching the staples.   The patient's ankle was scrubbed with Betadine scrub, cleaned off with  sterile towel, then DuraPrep up to the knee.  Stockinette, extremity  sheets, and drapes were applied.  Distal one half, the  staples were  removed from the fibula.  C-arm drape was applied.  Leg had been  elevated, prepping and draping, and tourniquet was inflated.  Second  from the bottom screw was at the appropriate level for syndesmosis.  The  fibular plate was lateral to slightly posterior and the fibula lined up  well for syndesmotic screw, and the second of the bottom screw was  removed, checked under fluoroscopy for confirmation, and a gold drill  bit Synthes was drilled across through the tibia, and syndesmosis was  closed with pressure between the medial and lateral malleolus, and screw  was tightened down, which was 50-mm cancellous screw, partially  threaded.  This compressed syndesmosis, and there was reduction of the  medial clear space.  The patient's history taking her splint on and off,  second screw was then placed in the next hole up.  After the second  screw was tightened down, the first screw was able to be tightened  slightly more, and spot fluoro pictures were taken showing the position  of 2 screws, both AP  and lateral and mortise view with the medial clear  space reduced.  Under fluoroscopy, the ankle was taken through  flexion/extension and valgus applied to the heel and medial clear space  stayed reduced.  Wound was irrigated and closed with 3-0 nylon sutures.  The rest of the proximal staples in the fibular incision and also the  medial malleolar incision where the deltoid ligament repair  had been  performed was then removed.  Tincture of benzoin and Steri-Strips were  applied.  Marcaine was infiltrated in the skin, and 4 x 4's, Webril, and  a short-leg splint was applied with 5 x 30s medial and lateral, lateral  to medial, and  then posterior.  More Webril was applied followed by Ace wrap x2.  The  patient tolerated the procedure well and was transferred to the recovery  room.  Outpatient Surgery is appropriate for treatment of this  condition.  Office follow up will be 1 week.  She  will be  nonweightbearing.      Mark C. Ophelia Charter, M.D.  Electronically Signed     MCY/MEDQ  D:  03/05/2008  T:  03/05/2008  Job:  [id number]

## 2010-10-27 NOTE — Op Note (Signed)
[facility]  Patient:    GENIEVE, RAMASWAMY                   MRN: [id number] Proc. Date: 11/08/00 Adm. Date:  [id number] Attending:  Judie Petit CC:         Lorelle Formosa, M.D.   Operative Report  PREOPERATIVE DIAGNOSIS:   Small-bowel obstruction.  POSTOPERATIVE DIAGNOSIS:  Small-bowel obstruction secondary to adhesions.  OPERATION:  Exploratory laparotomy with adhesiolysis.  SURGEON:  Mardene Celeste. Lurene Shadow, M.D.  ASSISTANT:  Marnee Spring. Wiliam Ke, M.D.  ANESTHESIA:  General.  INDICATIONS:  This patient is a 50 year old woman admitted with a four day history of abdomen distention, nausea and pain with left lower quadrant primarily.  She had no passage of flatus or bowel movements for the past four days.  Following admission, she was evaluated by a KUB which was consistent with a small bowel obstruction versus an ileus.  However, she showed no improvement over time, and a CT scan done of the abdomen showed high grade small bowel obstruction.  She is brought to the operating room now for exploratory laparotomy.  DESCRIPTION OF PROCEDURE:  Following the induction of anesthesia with the patient positioned supinely, the abdomen was prepped and draped routinely. Exploratory laparotomy was carried out through a midline incision, primarily lower abdomen, deepened through the skin and subcutaneous tissues and through the linea alba.  Upon entering the abdomen, the omentum from her previous was stuck to her anterior abdominal wall from her previous surgery and this was carefully lysed.  Large amounts of dilated small bowel were delivered into the operative field, and this was followed down to an area of normal caliber small bowel which was noticed to be blocked by multiple adhesions in the proximal ileum.  The adhesions were lysed thus freeing up the small bowel, and the small bowel was followed down to the ileocecal valve and back up to  the ligament of Trietz.  It was aspirated so as to have a tension free closure. All of the areas of dissection were checked for hemostasis and noted to be dry.  Sponge, instrument and sharp counts were verified.  The wounds were then closed in layers as follows.  The midline was closed with a running 6-0 Novofil and skin clips with staples.  A sterile dressing was applied, anesthetic reversed and the patient removed from the operating room to the recovery room in stable condition. DD:  11/08/00 TD:  11/08/00 Job: [id number]
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
## 2010-10-27 NOTE — Op Note (Signed)
NAME:  Alyssa Montgomery, Alyssa Montgomery                      ACCOUNT NO.:  [id number]   MEDICAL RECORD NO.:  [id number]                   PATIENT TYPE:  AMB   LOCATION:  DAY                                  FACILITY:  [facility]   PHYSICIAN:  Jene Every, M.D.                 DATE OF BIRTH:  Dec 31, 1960   DATE OF PROCEDURE:  02/23/2004  DATE OF DISCHARGE:                                 OPERATIVE REPORT   PREOPERATIVE DIAGNOSES:  Herniated nucleus pulposus, spinal stenosis L3-4  left.   POSTOPERATIVE DIAGNOSES:  Herniated nucleus pulposus, spinal stenosis L3-4  left.   PROCEDURE:  Lateral recess decompression, foraminotomy L3 and L4,  microdiskectomy L3-4.   ANESTHESIA:  General.   SURGEON:  Jene Every, M.D.   ASSISTANT:  Roma Schanz, P.A.   BRIEF HISTORY:  This is a 50 year old female with left lower extremity  radicular pain and L3 nerve root distribution secondary to HNP, foraminally  at L3-4.  The patient has been refractory to conservative treatment  including rest, physical therapy, antiinflammatory medication, activity  modification, had a selective nerve root block partially efficacious.  Operative intervention was indicated for decompression of L3 nerve root. The  risks and benefits were discussed including bleeding, infection, injury to  neurovascular structures, CSF leakage, epidural fibrosis, adjacent disease,  etc.   TECHNIQUE:  The patient in the supine position and after an adequate level  of general anesthesia and 1 g of Kefzol, she was placed prone on the  Andrew's frame, all bony prominences well padded. The lumbar region was  prepped and draped in the usual sterile fashion. Two 18 gauge spinal needles  were utilized to localize the 3-4 interspace confirmed with x-rays.  An  incision was made from the spinous process of 3 to 4, subcutaneous tissue  was dissected, electrocautery was utilized to achieve hemostasis. The  dorsolumbar fascia was identified and divided  in line with the skin  incision.  The paraspinous muscle was elevated from the lamina of 3 and 4.  A McCullough retractor was placed.  I put a Penfield 4 in the intralaminar  space, operating microscope was draped and brought in the surgical field.  A  hemilaminotomy in the caudad edge of 3 is performed with a 2 and a 3 mm  Kerrison and was enlarged with a high speed bur.  Ligamentum flavum then  detached from the cephalad edge of 4 and the caudad edge of 3.  Ligamentum  flavum removed from the inner space. Foraminotomy of L4 was performed.  The  thecal sac was identified as was the nerve root and was gently mobilized  medially. An epidural venous plexus was noted and bipolar electrocautery was  utilized to achieve hemostasis. Noted is a focal disk herniation at the  point of the medial border of the pedicle extending down laterally, this  consistent with that seen on the MRI.  I performed an annulotomy and copious  portions of the disk material was removed from the disk herniated site.  I  then further mobilized that with a nerve hook from the subannular space.  Multiple passes were made into the degenerated disk and was noted as well to  perform a  full diskectomy of all herniated materials.  Purchased initially  from the left side of the table and then changed to the right for access  laterally.  I mobilized it with an Epstein and a hockey stick and prior to  that placing a probe down in the foramen of 3 found this to be compressed  against the 3 nerve root.  Following the decompression, the 3 and 4 roots  were freely mobile without residual compression.  Bipolar electrocautery was  utilized to achieve hemostasis.  Inspection revealed no evidence of CSF  leakage or active bleeding. The hockey stick probe was placed and the  foramen of 3 and 4 found to be widely patent. I copiously irrigated the disk  space with antibiotic irrigation. Thrombin soaked Gelfoam was placed in the  laminotomy  defect. The McCullough retractor was removed, paraspinous muscles  inspected, no evidence of active bleeding. The dorsolumbar fascia  reapproximated with #1 Vicryl interrupted figure-of-eight suture,  subcutaneous tissue reapproximated with 2-0 Vicryl simple suture. The skin  was reapproximated with 4-0 subcuticular Prolene.  The wound was reinforced  with Steri-Strips, sterile dressing applied. She was placed supine on the  hospital bed, extubated without difficulty and transported to the recovery  room in satisfactory condition.   The patient tolerated the procedure well with no complications.  Blood loss  was minimal.                                               Jene Every, M.D.    JB/MEDQ  D:  02/23/2004  T:  02/23/2004  Job:  [id number]

## 2010-10-27 NOTE — Discharge Summary (Signed)
[facility]  Patient:    Alyssa Montgomery, Alyssa Montgomery                   MRN: [id number] Adm. Date:  [id number] Disc. Date: [id number] Attending:  Sonda Primes CC:         Lorelle Formosa, M.D.   Discharge Summary  ADMITTING DIAGNOSES:  1. Urinary tract infection with pyelonephritis.  2. History of drug abuse.  3. Rule out bowel obstruction.  DISCHARGE DIAGNOSES:  1. Pyelonephritis, resolved.  2. Small bowel obstruction.  3. History of drug abuse.  4. Depression.  PROCEDURE:  Exploratory laparotomy and adhesiolysis with small bowel obstruction.  COMPLICATIONS:  None.  CONDITION ON DISCHARGE:  Improved.  HISTORY OF PRESENT ILLNESS:  The patient is a 50 year old woman with a history of multiple drug abuse primarily crack and cocaine who presents to the hospital with flank and abdominal pain. On admission, her urinalysis showed white blood cells in the urine too numerous to count. She was admitted and started on Tequin. She continued to have severe abdominal pain and further evaluation showed that she had abdominal distention and KUB picture consistent with small bowel obstruction. She was seen in consultation. CT scan of the abdomen confirmed a high grade small bowel obstruction and she was removed and taken to the operating room on Nov 08, 2000 where she underwent exploratory laparotomy and adhesiolysis. Postoperative course has been benign from the physical standpoint with slow but consistent resumption of diet and bowel activity. At the time of discharge, she is tolerating a regular diet. Repeat urinalyses showed no evidence of continued urinary tract infection. She is being discharged now to be followed in the office in two weeks.  DISCHARGE MEDICATIONS:  Percocet 1-2 every 4h p.r.n. for pain.  DISCHARGE ACTIVITY: As tolerated.  DISCHARGE DIET:  Unrestricted. DD:  11/16/00 TD:  11/18/00 Job: [id number]

## 2010-10-27 NOTE — Discharge Summary (Signed)
[facility]  Patient:    Alyssa Montgomery, Alyssa Montgomery                   MRN: [id number] Adm. Date:  [id number] Disc. Date: [id number] Attending:  Sonda Primes Dictator:   Candi Leash. Orsini, N.P.                           Discharge Summary  IDENTIFYING INFORMATION:  This is a 50 year old African-American female, separated, voluntarily admitted for depression and suicidal thoughts.  The patient was admitted through the emergency room after having some left flank pain with negative workup.  The patient has been followed by [facility] for depression.  The patient stopped her Effexor and Remeron several months ago as they were making her too drowsy.  The patient has been using cocaine, binging on a periodic basis.  The patient cites stressors in her life as her boyfriend and ex-husband.  The patient reports decreased sleep with frequent awakenings, decreased appetite with a 20 pound weight loss.  The patient reporting passive suicidal thoughts.  PAST MEDICAL HISTORY:  The patient is followed at the [facility].  Current medical problems: History of unprotected sex; the patient is concerned about her risk for HIV.  DRUG ALLERGIES:  PENICILLIN.  PHYSICAL EXAMINATION:  GENERAL:  Performed at [facility].  The patient was positive for left flank pain.  LABORATORY DATA:  Urine drug screen was positive for cocaine.  Urine showed elevated urobilinogen.  CT scan of both the pelvis and abdomen were negative.  MENTAL STATUS EXAMINATION:  Casually dressed black female with appropriate affect.  Polite and cooperative, frustrated and generally depressed.  Speech is normal in pace and tone; it is spontaneous.  Mood is depressed and tearful; the patient is mildly irritable.  Thought process: Positive suicidal ideation with no plan or intent; hopelessness evident but the patient is motivated to change; no evidence  of psychosis, delusions, or paranoia; no auditory or visual hallucinations.  The patient seems clear and cognizant about her substance abuse and its effects on her life and seems motivated to change. Cognitive: Oriented x 3 and intact.  Intelligence is average.  Impulse control is fair.  Insight is good.  Judgment is fair.  ADMITTING DIAGNOSES: Axis I:    1. Major depression, recurrent, severe.            2. Alcohol abuse, rule out alcohol dependence.            3. Cocaine abuse. Axis II:   Deferred. Axis III:  1. Prosthetic right eye.            2. Anemia, not otherwise specified.            3. Status post left flank pain. Axis IV:   Moderate psychosocial stressors related to primary support group,            social environment, substance abuse. Axis V:    Current is 25, this past year is 60.  HOSPITAL COURSE:  The patient was admitted to stabilize her mood.  Repeat urinalysis, obtain further labs.  The patient was started on Effexor and the patient was placed back on her Remeron and utilized the case managers to work at discharge planning and follow-up appointments.  The patient continued to have episodes of abdominal cramping and emesis that were unrelieved by medications.  The patient was discharged to the  emergency room at [facility] on Nov 06, 2000, after receiving an injection of Phenergan.  The patient was admitted there.  The patient had an elevated WBC count with no change in her lipase.  Urine culture was positive for E. coli.  DIAGNOSES AT THE TIME OF TRANSFER: Axis I:    1. Major depression, recurrent, severe.            2. Alcohol abuse, rule out alcohol dependence.            3. Cocaine abuse. Axis II:   Deferred. Axis III:  1. Prosthetic right eye.            2. Anemia, not otherwise specified.            3. Status post left flank pain. Axis IV:   Moderate psychosocial stressors. Axis V:    Current is 50, this past year is 60. DD:  12/10/00 TD:  12/10/00 Job:  [id number]

## 2010-11-01 ENCOUNTER — Other Ambulatory Visit (HOSPITAL_COMMUNITY): Payer: Self-pay | Admitting: Family Medicine

## 2010-11-01 DIAGNOSIS — Z1231 Encounter for screening mammogram for malignant neoplasm of breast: Secondary | ICD-10-CM

## 2010-12-05 ENCOUNTER — Ambulatory Visit (HOSPITAL_COMMUNITY): Payer: Self-pay

## 2010-12-08 ENCOUNTER — Other Ambulatory Visit: Payer: Self-pay | Admitting: Family Medicine

## 2010-12-08 ENCOUNTER — Ambulatory Visit (HOSPITAL_COMMUNITY): Payer: Self-pay

## 2010-12-14 ENCOUNTER — Ambulatory Visit (HOSPITAL_COMMUNITY): Payer: Self-pay | Attending: Family Medicine

## 2011-02-16 ENCOUNTER — Other Ambulatory Visit (HOSPITAL_COMMUNITY): Payer: Self-pay | Admitting: Family Medicine

## 2011-02-16 DIAGNOSIS — Z13820 Encounter for screening for osteoporosis: Secondary | ICD-10-CM

## 2011-03-02 ENCOUNTER — Ambulatory Visit (HOSPITAL_COMMUNITY)
Admission: RE | Admit: 2011-03-02 | Discharge: 2011-03-02 | Disposition: A | Discharge status: Home / Self Care | Payer: Self-pay | Source: Ambulatory Visit | Attending: Family Medicine | Admitting: Family Medicine

## 2011-03-02 ENCOUNTER — Other Ambulatory Visit (HOSPITAL_COMMUNITY): Payer: Self-pay | Admitting: Family Medicine

## 2011-03-02 DIAGNOSIS — M25519 Pain in unspecified shoulder: Secondary | ICD-10-CM | POA: Insufficient documentation

## 2011-03-02 DIAGNOSIS — M79609 Pain in unspecified limb: Secondary | ICD-10-CM | POA: Insufficient documentation

## 2011-03-02 DIAGNOSIS — Z1231 Encounter for screening mammogram for malignant neoplasm of breast: Secondary | ICD-10-CM

## 2011-03-02 DIAGNOSIS — Z13820 Encounter for screening for osteoporosis: Secondary | ICD-10-CM

## 2011-03-12 LAB — POCT HEMOGLOBIN-HEMACUE: Hemoglobin: 11.8 — ABNORMAL LOW

## 2011-04-05 ENCOUNTER — Ambulatory Visit (INDEPENDENT_AMBULATORY_CARE_PROVIDER_SITE_OTHER): Payer: Self-pay | Admitting: Obstetrics & Gynecology

## 2011-04-05 ENCOUNTER — Encounter: Payer: Self-pay | Admitting: Obstetrics & Gynecology

## 2011-04-05 VITALS — BP 144/91 | HR 68 | Temp 97.5°F | Ht 64.0 in | Wt 207.3 lb

## 2011-04-05 DIAGNOSIS — G8929 Other chronic pain: Secondary | ICD-10-CM | POA: Insufficient documentation

## 2011-04-05 DIAGNOSIS — R87615 Unsatisfactory cytologic smear of cervix: Secondary | ICD-10-CM

## 2011-04-05 DIAGNOSIS — N949 Unspecified condition associated with female genital organs and menstrual cycle: Secondary | ICD-10-CM

## 2011-04-05 NOTE — Progress Notes (Signed)
History:  50 y.o [id number] here today with pelvic pain.  Patient underwent a D&C, hysteroscopic myomectomy with Myosure in 12/2009 but the fibroid was not able to be fully resected.  Since then, patient denies having any bleeding.  She has been evaluated by her PCP for abdominal pain and GI symptoms; colonoscopy revealed diverticulitis.  Patient says that this pelvic pain is different from the GI pain; it feels like she is "about to get period but it does not come out".  She feels she needs another "D&C and to get the rest of the fibroid out".  Denies any other GYN concerns or genitourinary concerns.  Patient is up to date with her mammogram; pap smear on 12/08/10 was negative but lacked an endocervical component. No other symptoms.  Of note, patient had prior abdominal surgery; ELAP and adhesiolysis in 2002 after a small bowel obstruction caused by adhesions.   The following portions of the patient's history were reviewed and updated as appropriate: allergies, current medications, past family history, past medical history, past social history, past surgical history and problem list.  Objective:  Physical Exam Blood pressure 144/91, pulse 68, temperature 97.5 F (36.4 C), temperature source Oral, height 5\' 4"  (1.626 m), weight 207 lb 4.8 oz (94.031 kg). Gen: NAD Abd: Soft, suprapubic/lower abdominal tenderness, no masses palpated. Well healed vertical incision. Pelvic: Normal appearing external genitalia; vaginal mucosa and cervix with some atrophy and loss of ruggae.  Her uterus and adnexa are not palpated fully secondary to habitus , there is tenderness on palpation of uterus and on her left adnexal area.  Pap was repeated but there was cervical stenosis noted; I was able to pass endobrush halfway into cervical canal.  Labs and Imaging 09/27/09 TRANSABDOMINAL AND TRANSVAGINAL ULTRASOUND OF PELVIS  Uterus measures 8.8 x 4.8 x 5.4 cm. On the previous study there is a posterior intramural mass consistent  with probable fibroid. This was not definitely identified on current study. There is an uterine  mass which is next to the endometrial stripe. It is felt to be most likely intramural subendometrial uterine leiomyoma. This mass measures 2.5 x 2.0 x 1.6 cm. On the previous study it measured  1.6 x 1.6 x 1.5 cm. Endometrium measures 1.2 cm. No endometrial mass or fluid collection is seen.  Right Ovary : History given of previous right oophorectomy. No right ovary was identified.  Left Ovary measures 3.2 x 1.9 x 1.7 cm. A cyst is seen measuring 1.4 x 1.3 x 1.2 cm. On the previous study a complex cystic lesion was seen in the left adnexal region with the ovary not well visualized. This cystic lesion on the previous study measured 4.2 x 1.7 x 1.7 cm. It is no longer evident. If the current cystic area is the same cystic area it is smaller. Other Findings: No free fluid is seen in the pelvis. No tubal abnormality is evident.  IMPRESSION: The mass next to the endometrium probably reflects uterine leiomyoma but it has become larger than on the previous study. The more posterior mass previously demonstrated within the myometrium is not demonstrated on the current study. The complex cystic mass seen in the left adnexal region is no longer evident. There is now a small cyst in that area. If which is the same lesion  it is smaller.    Assessment & Plan:  50 y.o [id number] here today with pelvic pain that she attributes to her uterus and fibroid.  No bleeding concerns but patient noted to have  cervical stenosis on exam.  Repeat pap pending. Differential diagnoses include: hematometra causing pain, degenerating submucosal fibroid, ovarian cyst, pain from adhesions, diverticulitis etc.   Will obtain a pelvic ultrasound,  follow up results and manage accordingly.  Patient is very interested in having a repeat D&C and hysteroscopic myomectomy, does not want hysterectomy.   Patient declines the flu vaccine because she is "not  feeling the best today".  Flu vaccine info given.

## 2011-04-05 NOTE — Patient Instructions (Signed)
Pelvic Pain Pelvic pain is pain below the belly button and located between your hips. Acute pain may last a few hours or days. Chronic pelvic pain may last weeks and months. The cause may be different for different types of pain. The pain may be dull or sharp, mild or severe and can interfere with your daily activities. Write down and tell your caregiver:   Exactly where the pain is located.   If it comes and goes or is there all the time.   When it happens (with sex, urination, bowel movement, etc.)   If the pain is related to your menstrual period or stress.  Your caregiver will take a full history and do a complete physical exam and Pap test. CAUSES   Painful menstrual periods (dysmenorrhea).   Normal ovulation (Mittelschmertz) that occurs in the middle of the menstrual cycle every month.   The pelvic organs get engorged with blood just before the menstrual period (pelvic congestive syndrome).   Scar tissue from an infection or past surgery (pelvic adhesions).   Cancer of the female pelvic organs. When there is pain with cancer, it has been there for a long time.   The lining of the uterus (endometrium) abnormally grows in places like the pelvis and on the pelvic organs (endometriosis).   A form of endometriosis with the lining of the uterus present inside of the muscle tissue of the uterus (adenomyosis).   Fibroid tumor (noncancerous) in the uterus.   Bladder problems such as infection, bladder spasms of the muscle tissue of the bladder.   Intestinal problems (irritable bowel syndrome, colitis, an ulcer or gastrointestinal infection).   Polyps of the cervix or uterus.   Pregnancy in the tube (ectopic pregnancy).   The opening of the cervix is too small for the menstrual blood to flow through it (cervical stenosis).   Physical or sexual abuse (past or present).   Musculo-skeletal problems from poor posture, problems with the vertebrae of the lower back or the uterine  pelvic muscles falling (prolapse).   Psychological problems such as depression or stress.   IUD (intrauterine device) in the uterus.  DIAGNOSIS  Tests to make a diagnosis depends on the type, location, severity and what causes the pain to occur. Tests that may be needed include:  Blood tests.   Urine tests   Ultrasound.   X-rays.   CT Scan.   MRI.   Laparoscopy.   Major surgery.  TREATMENT  Treatment will depend on the cause of the pain, which includes:  Prescription or over-the-counter pain medication.   Antibiotics.   Birth control pills.   Hormone treatment.   Nerve blocking injections.   Physical therapy.   Antidepressants.   Counseling with a psychiatrist or psychologist.   Minor or major surgery.  HOME CARE INSTRUCTIONS   Only take over-the-counter or prescription medicines for pain, discomfort or fever as directed by your caregiver.   Follow your caregiver's advice to treat your pain.   Rest.   Avoid sexual intercourse if it causes the pain.   Apply warm or cold compresses (which ever works best) to the pain area.   Do relaxation exercises such as yoga or meditation.   Try acupuncture.   Avoid stressful situations.   Try group therapy.   If the pain is because of a stomach/intestinal upset, drink clear liquids, eat a bland light food diet until the symptoms go away.  SEEK MEDICAL CARE IF:   You need stronger prescription pain medication.     You develop pain with sexual intercourse.   You have pain with urination.   You develop a temperature of 102 F (38.9 C) with the pain.   You are still in pain after 4 hours of taking prescription medication for the pain.   You need depression medication.   Your IUD is causing pain and you want it removed.  SEEK IMMEDIATE MEDICAL CARE IF:  You develop very severe pain or tenderness.   You faint, have chills, severe weakness or dehydration.   You develop heavy vaginal bleeding or passing  solid tissue.   You develop a temperature of 102 F (38.9 C) with the pain.   You have blood in the urine.   You are being physically or sexually abused.   You have uncontrolled vomiting and diarrhea.   You are depressed and afraid of harming yourself or someone else.  Document Released: 07/05/2004 Document Revised: 02/07/2011 Document Reviewed: 04/01/2008 Exit[employer] Patient Information 2012 [employer]. 

## 2011-04-10 ENCOUNTER — Ambulatory Visit (HOSPITAL_COMMUNITY): Admission: RE | Admit: 2011-04-10 | Payer: Self-pay | Source: Ambulatory Visit

## 2011-04-10 ENCOUNTER — Ambulatory Visit (HOSPITAL_COMMUNITY): Payer: Self-pay

## 2011-04-17 ENCOUNTER — Ambulatory Visit (HOSPITAL_COMMUNITY)
Admission: RE | Admit: 2011-04-17 | Discharge: 2011-04-17 | Disposition: A | Discharge status: Home / Self Care | Payer: Self-pay | Source: Ambulatory Visit | Attending: Obstetrics & Gynecology | Admitting: Obstetrics & Gynecology

## 2011-04-17 DIAGNOSIS — G8929 Other chronic pain: Secondary | ICD-10-CM

## 2011-04-17 DIAGNOSIS — D259 Leiomyoma of uterus, unspecified: Secondary | ICD-10-CM | POA: Insufficient documentation

## 2011-04-17 DIAGNOSIS — N949 Unspecified condition associated with female genital organs and menstrual cycle: Secondary | ICD-10-CM | POA: Insufficient documentation

## 2011-04-26 ENCOUNTER — Ambulatory Visit: Payer: Self-pay | Admitting: Obstetrics and Gynecology

## 2011-05-05 ENCOUNTER — Emergency Department (HOSPITAL_COMMUNITY): Payer: Self-pay

## 2011-05-05 ENCOUNTER — Encounter (HOSPITAL_COMMUNITY): Payer: Self-pay | Admitting: Emergency Medicine

## 2011-05-05 ENCOUNTER — Emergency Department (HOSPITAL_COMMUNITY)
Admission: EM | Admit: 2011-05-05 | Discharge: 2011-05-05 | Disposition: A | Discharge status: Home / Self Care | Payer: Self-pay | Attending: Emergency Medicine | Admitting: Emergency Medicine

## 2011-05-05 DIAGNOSIS — R109 Unspecified abdominal pain: Secondary | ICD-10-CM | POA: Insufficient documentation

## 2011-05-05 DIAGNOSIS — R110 Nausea: Secondary | ICD-10-CM | POA: Insufficient documentation

## 2011-05-05 DIAGNOSIS — R0602 Shortness of breath: Secondary | ICD-10-CM | POA: Insufficient documentation

## 2011-05-05 DIAGNOSIS — R05 Cough: Secondary | ICD-10-CM | POA: Insufficient documentation

## 2011-05-05 DIAGNOSIS — H5440 Blindness, one eye, unspecified eye: Secondary | ICD-10-CM | POA: Insufficient documentation

## 2011-05-05 DIAGNOSIS — R059 Cough, unspecified: Secondary | ICD-10-CM | POA: Insufficient documentation

## 2011-05-05 DIAGNOSIS — R197 Diarrhea, unspecified: Secondary | ICD-10-CM | POA: Insufficient documentation

## 2011-05-05 DIAGNOSIS — R630 Anorexia: Secondary | ICD-10-CM | POA: Insufficient documentation

## 2011-05-05 DIAGNOSIS — Z7982 Long term (current) use of aspirin: Secondary | ICD-10-CM | POA: Insufficient documentation

## 2011-05-05 LAB — CBC
HCT: 40.4 % (ref 36.0–46.0)
Hemoglobin: 13.3 g/dL (ref 12.0–15.0)
MCH: 26.8 pg (ref 26.0–34.0)
MCHC: 32.9 g/dL (ref 30.0–36.0)
MCV: 81.5 fL (ref 78.0–100.0)
Platelets: 239 10*3/uL (ref 150–400)
RBC: 4.96 MIL/uL (ref 3.87–5.11)
RDW: 14.7 % (ref 11.5–15.5)
WBC: 6.7 10*3/uL (ref 4.0–10.5)

## 2011-05-05 LAB — COMPREHENSIVE METABOLIC PANEL
ALT: 16 U/L (ref 0–35)
AST: 15 U/L (ref 0–37)
Albumin: 3.5 g/dL (ref 3.5–5.2)
Alkaline Phosphatase: 120 U/L — ABNORMAL HIGH (ref 39–117)
BUN: 16 mg/dL (ref 6–23)
CO2: 26 mEq/L (ref 19–32)
Calcium: 9.3 mg/dL (ref 8.4–10.5)
Chloride: 104 mEq/L (ref 96–112)
Creatinine, Ser: 0.93 mg/dL (ref 0.50–1.10)
GFR calc Af Amer: 81 mL/min — ABNORMAL LOW (ref >90)
GFR calc non Af Amer: 70 mL/min — ABNORMAL LOW (ref >90)
Glucose, Bld: 95 mg/dL (ref 70–99)
Potassium: 3.4 mEq/L — ABNORMAL LOW (ref 3.5–5.1)
Sodium: 139 mEq/L (ref 135–145)
Total Bilirubin: 0.2 mg/dL — ABNORMAL LOW (ref 0.3–1.2)
Total Protein: 7.5 g/dL (ref 6.0–8.3)

## 2011-05-05 LAB — DIFFERENTIAL
Basophils Absolute: 0 10*3/uL (ref 0.0–0.1)
Basophils Relative: 1 % (ref 0–1)
Eosinophils Absolute: 0.2 10*3/uL (ref 0.0–0.7)
Eosinophils Relative: 3 % (ref 0–5)
Lymphocytes Relative: 37 % (ref 12–46)
Lymphs Abs: 2.5 10*3/uL (ref 0.7–4.0)
Monocytes Absolute: 0.7 10*3/uL (ref 0.1–1.0)
Monocytes Relative: 11 % (ref 3–12)
Neutro Abs: 3.3 10*3/uL (ref 1.7–7.7)
Neutrophils Relative %: 49 % (ref 43–77)

## 2011-05-05 LAB — URINALYSIS, ROUTINE W REFLEX MICROSCOPIC
Bilirubin Urine: NEGATIVE
Glucose, UA: NEGATIVE mg/dL
Hgb urine dipstick: NEGATIVE
Ketones, ur: NEGATIVE mg/dL
Leukocytes, UA: NEGATIVE
Nitrite: NEGATIVE
Protein, ur: NEGATIVE mg/dL
Specific Gravity, Urine: 1.027 (ref 1.005–1.030)
Urobilinogen, UA: 0.2 mg/dL (ref 0.0–1.0)
pH: 5.5 (ref 5.0–8.0)

## 2011-05-05 LAB — LIPASE, BLOOD: Lipase: 20 U/L (ref 11–59)

## 2011-05-05 MED ORDER — OXYCODONE-ACETAMINOPHEN 5-325 MG PO TABS
ORAL_TABLET | ORAL | Status: AC
  Filled 2011-05-05: qty 1

## 2011-05-05 MED ORDER — OXYCODONE-ACETAMINOPHEN 5-325 MG PO TABS
1.0000 | ORAL_TABLET | Freq: Once | ORAL | Status: AC
  Administered 2011-05-05: 1 via ORAL

## 2011-05-05 MED ORDER — IPRATROPIUM BROMIDE HFA 17 MCG/ACT IN AERS
2.0000 | INHALATION_SPRAY | Freq: Once | RESPIRATORY_TRACT | Status: DC
  Filled 2011-05-05: qty 12.9

## 2011-05-05 MED ORDER — OXYCODONE-ACETAMINOPHEN 5-325 MG PO TABS: 1.0000 | ORAL_TABLET | Freq: Four times a day (QID) | ORAL | Status: AC | PRN

## 2011-05-05 MED ORDER — SODIUM CHLORIDE 0.9 % IV SOLN
Freq: Once | INTRAVENOUS | Status: AC
  Administered 2011-05-05: 125 mL/h via INTRAVENOUS

## 2011-05-05 MED ORDER — ALBUTEROL SULFATE HFA 108 (90 BASE) MCG/ACT IN AERS
INHALATION_SPRAY | RESPIRATORY_TRACT | Status: AC
  Administered 2011-05-05: 07:00:00
  Filled 2011-05-05: qty 6.7

## 2011-05-05 MED ORDER — ONDANSETRON 4 MG PO TBDP
4.0000 mg | ORAL_TABLET | Freq: Once | ORAL | Status: AC
  Administered 2011-05-05: 4 mg via ORAL
  Filled 2011-05-05: qty 1

## 2011-05-05 MED ORDER — PROMETHAZINE HCL 25 MG PO TABS: 12.5000 mg | ORAL_TABLET | Freq: Four times a day (QID) | ORAL | Status: DC | PRN

## 2011-05-05 MED ORDER — ALBUTEROL SULFATE (5 MG/ML) 0.5% IN NEBU
5.0000 mg | INHALATION_SOLUTION | Freq: Once | RESPIRATORY_TRACT | Status: AC
  Administered 2011-05-05: 5 mg via RESPIRATORY_TRACT
  Filled 2011-05-05: qty 1

## 2011-05-05 MED ORDER — IOHEXOL 300 MG/ML  SOLN
100.0000 mL | Freq: Once | INTRAMUSCULAR | Status: AC | PRN
  Administered 2011-05-05: 100 mL via INTRAVENOUS

## 2011-05-05 NOTE — ED Provider Notes (Signed)
  Physical Exam  BP 106/70  Pulse 70  Temp(Src) 97.9 F (36.6 C) (Oral)  Resp 70  SpO2 98%  Physical Exam  ED Course  Procedures  MDM Patient pain improved from an 8-9 to a 7. Labs and imaging within normal limits. Advised follow-up with PCP if pain continues. Return to ED for Worsening. Patient agreeable with plan and ready for d/c     Thomasene Lot, [place] 05/05/11 1006

## 2011-05-05 NOTE — ED Notes (Signed)
PT. REPORTS RIGHT LATERAL ABDOMINAL PAIN X 5 DAYS ,  VOMITTING AND DIARRHEA , DENIES FEVER OR CHILLS , DENIES DYSURIA OR HEMATURIA , NO VAGINAL DISCHARGE.
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
## 2011-05-05 NOTE — ED Notes (Signed)
Pt no longer with wheezes post breathing tx.  Continues to experience R Q pain 8/10 but does not want pain meds.    BS WNLl in all 4 quadrants.  No change in bowel and bladder habits, though pt states hx of abd surgery for blockages.

## 2011-05-05 NOTE — ED Notes (Signed)
Pt. Finished contrast. 

## 2011-05-09 ENCOUNTER — Encounter: Payer: Self-pay | Admitting: Obstetrics and Gynecology

## 2011-05-09 ENCOUNTER — Ambulatory Visit (INDEPENDENT_AMBULATORY_CARE_PROVIDER_SITE_OTHER): Payer: Self-pay | Admitting: Obstetrics and Gynecology

## 2011-05-09 DIAGNOSIS — G8929 Other chronic pain: Secondary | ICD-10-CM

## 2011-05-09 DIAGNOSIS — D259 Leiomyoma of uterus, unspecified: Secondary | ICD-10-CM

## 2011-05-09 DIAGNOSIS — N949 Unspecified condition associated with female genital organs and menstrual cycle: Secondary | ICD-10-CM

## 2011-05-09 MED ORDER — AZITHROMYCIN 250 MG PO TABS: ORAL_TABLET | ORAL | Status: AC

## 2011-05-09 NOTE — Progress Notes (Signed)
50 yo presented today as a follow-up. Patient reports no episode of vaginal bleeding since her ablation in 2010. Patient was concerned about lack of menses and requested a D&C to make sure everything was okay. Effects of endometrial ablation explained. Patient verbalized understanding and understands that the lack of menses is normal. Patient with generalized aches and pains and cough. B/l wheezing heard on exam. Patient rx z-pack and instructed to present to ED if no improvement in symptoms. Patient advised to continue albuterol as prescribed and to quit smoking. Patient also reports twisting her knee a few days ago and has noticed some right sided abdominal and back pain since. Pain reproducible on exam c/w musculoskeletal strain. Patient advised to use ibuprofen and heat to the area.  Patient to return in October 2013 for annual exam.

## 2011-05-09 NOTE — Patient Instructions (Signed)
Smoking Cessation This document explains the best ways for you to quit smoking and new treatments to help. It lists new medicines that can double or triple your chances of quitting and quitting for good. It also considers ways to avoid relapses and concerns you may have about quitting, including weight gain. NICOTINE: A POWERFUL ADDICTION If you have tried to quit smoking, you know how hard it can be. It is hard because nicotine is a very addictive drug. For some people, it can be as addictive as heroin or cocaine. Usually, people make 2 or 3 tries, or more, before finally being able to quit. Each time you try to quit, you can learn about what helps and what hurts. Quitting takes hard work and a lot of effort, but you can quit smoking. QUITTING SMOKING IS ONE OF THE MOST IMPORTANT THINGS YOU WILL EVER DO.  You will live longer, feel better, and live better.   The impact on your body of quitting smoking is felt almost immediately:   Within 20 minutes, blood pressure decreases. Pulse returns to its normal level.   After 8 hours, carbon monoxide levels in the blood return to normal. Oxygen level increases.   After 24 hours, chance of heart attack starts to decrease. Breath, hair, and body stop smelling like smoke.   After 48 hours, damaged nerve endings begin to recover. Sense of taste and smell improve.   After 72 hours, the body is virtually free of nicotine. Bronchial tubes relax and breathing becomes easier.   After 2 to 12 weeks, lungs can hold more air. Exercise becomes easier and circulation improves.   Quitting will reduce your risk of having a heart attack, stroke, cancer, or lung disease:   After 1 year, the risk of coronary heart disease is cut in half.   After 5 years, the risk of stroke falls to the same as a nonsmoker.   After 10 years, the risk of lung cancer is cut in half and the risk of other cancers decreases significantly.   After 15 years, the risk of coronary heart  disease drops, usually to the level of a nonsmoker.   If you are pregnant, quitting smoking will improve your chances of having a healthy baby.   The people you live with, especially your children, will be healthier.   You will have extra money to spend on things other than cigarettes.  FIVE KEYS TO QUITTING Studies have shown that these 5 steps will help you quit smoking and quit for good. You have the best chances of quitting if you use them together: 1. Get ready.  2. Get support and encouragement.  3. Learn new skills and behaviors.  4. Get medicine to reduce your nicotine addiction and use it correctly.  5. Be prepared for relapse or difficult situations. Be determined to continue trying to quit, even if you do not succeed at first.  1. GET READY  Set a quit date.   Change your environment.   Get rid of ALL cigarettes, ashtrays, matches, and lighters in your home, car, and place of work.   Do not let people smoke in your home.   Review your past attempts to quit. Think about what worked and what did not.   Once you quit, do not smoke. NOT EVEN A PUFF!  2. GET SUPPORT AND ENCOURAGEMENT Studies have shown that you have a better chance of being successful if you have help. You can get support in many ways.  Tell   your family, friends, and coworkers that you are going to quit and need their support. Ask them not to smoke around you.   Talk to your caregivers (doctor, dentist, nurse, pharmacist, psychologist, and/or smoking counselor).   Get individual, group, or telephone counseling and support. The more counseling you have, the better your chances are of quitting. Programs are available at local hospitals and health centers. Call your local health department for information about programs in your area.   Spiritual beliefs and practices may help some smokers quit.   Quit meters are small computer programs online or downloadable that keep track of quit statistics, such as amount  of "quit-time," cigarettes not smoked, and money saved.   Many smokers find one or more of the many self-help books available useful in helping them quit and stay off tobacco.  3. LEARN NEW SKILLS AND BEHAVIORS  Try to distract yourself from urges to smoke. Talk to someone, go for a walk, or occupy your time with a task.   When you first try to quit, change your routine. Take a different route to work. Drink tea instead of coffee. Eat breakfast in a different place.   Do something to reduce your stress. Take a hot bath, exercise, or read a book.   Plan something enjoyable to do every day. Reward yourself for not smoking.   Explore interactive web-based programs that specialize in helping you quit.  4. GET MEDICINE AND USE IT CORRECTLY Medicines can help you stop smoking and decrease the urge to smoke. Combining medicine with the above behavioral methods and support can quadruple your chances of successfully quitting smoking. The [place] Food and Drug Administration (FDA) has approved 7 medicines to help you quit smoking. These medicines fall into 3 categories.  Nicotine replacement therapy (delivers nicotine to your body without the negative effects and risks of smoking):   Nicotine gum: Available over-the-counter.   Nicotine lozenges: Available over-the-counter.   Nicotine inhaler: Available by prescription.   Nicotine nasal spray: Available by prescription.   Nicotine skin patches (transdermal): Available by prescription and over-the-counter.   Antidepressant medicine (helps people abstain from smoking, but how this works is unknown):   Bupropion sustained-release (SR) tablets: Available by prescription.   Nicotinic receptor partial agonist (simulates the effect of nicotine in your brain):   Varenicline tartrate tablets: Available by prescription.   Ask your caregiver for advice about which medicines to use and how to use them. Carefully read the information on the package.    Everyone who is trying to quit may benefit from using a medicine. If you are pregnant or trying to become pregnant, nursing an infant, you are under age 18, or you smoke fewer than 10 cigarettes per day, talk to your caregiver before taking any nicotine replacement medicines.   You should stop using a nicotine replacement product and call your caregiver if you experience nausea, dizziness, weakness, vomiting, fast or irregular heartbeat, mouth problems with the lozenge or gum, or redness or swelling of the skin around the patch that does not go away.   Do not use any other product containing nicotine while using a nicotine replacement product.   Talk to your caregiver before using these products if you have diabetes, heart disease, asthma, stomach ulcers, you had a recent heart attack, you have high blood pressure that is not controlled with medicine, a history of irregular heartbeat, or you have been prescribed medicine to help you quit smoking.  5. BE PREPARED FOR RELAPSE OR   DIFFICULT SITUATIONS  Most relapses occur within the first 3 months after quitting. Do not be discouraged if you start smoking again. Remember, most people try several times before they finally quit.   You may have symptoms of withdrawal because your body is used to nicotine. You may crave cigarettes, be irritable, feel very hungry, cough often, get headaches, or have difficulty concentrating.   The withdrawal symptoms are only temporary. They are strongest when you first quit, but they will go away within 10 to 14 days.  Here are some difficult situations to watch for:  Alcohol. Avoid drinking alcohol. Drinking lowers your chances of successfully quitting.   Caffeine. Try to reduce the amount of caffeine you consume. It also lowers your chances of successfully quitting.   Other smokers. Being around smoking can make you want to smoke. Avoid smokers.   Weight gain. Many smokers will gain weight when they quit, usually  less than 10 pounds. Eat a healthy diet and stay active. Do not let weight gain distract you from your main goal, quitting smoking. Some medicines that help you quit smoking may also help delay weight gain. You can always lose the weight gained after you quit.   Bad mood or depression. There are a lot of ways to improve your mood other than smoking.  If you are having problems with any of these situations, talk to your caregiver. SPECIAL SITUATIONS AND CONDITIONS Studies suggest that everyone can quit smoking. Your situation or condition can give you a special reason to quit.  Pregnant women/new mothers: By quitting, you protect your baby's health and your own.   Hospitalized patients: By quitting, you reduce health problems and help healing.   Heart attack patients: By quitting, you reduce your risk of a second heart attack.   Lung, head, and neck cancer patients: By quitting, you reduce your chance of a second cancer.   Parents of children and adolescents: By quitting, you protect your children from illnesses caused by secondhand smoke.  QUESTIONS TO THINK ABOUT Think about the following questions before you try to stop smoking. You may want to talk about your answers with your caregiver.  Why do you want to quit?   If you tried to quit in the past, what helped and what did not?   What will be the most difficult situations for you after you quit? How will you plan to handle them?   Who can help you through the tough times? Your family? Friends? Caregiver?   What pleasures do you get from smoking? What ways can you still get pleasure if you quit?  Here are some questions to ask your caregiver:  How can you help me to be successful at quitting?   What medicine do you think would be best for me and how should I take it?   What should I do if I need more help?   What is smoking withdrawal like? How can I get information on withdrawal?  Quitting takes hard work and a lot of effort,  but you can quit smoking. FOR MORE INFORMATION  Smokefree.gov (http://www.smokefree.gov) provides free, accurate, evidence-based information and professional assistance to help support the immediate and long-term needs of people trying to quit smoking. Document Released: 05/22/2001 Document Revised: 02/07/2011 Document Reviewed: 03/14/2009 [employer] Patient Information 2012 [employer]. 

## 2011-11-28 ENCOUNTER — Encounter (HOSPITAL_COMMUNITY): Payer: Self-pay | Admitting: *Deleted

## 2011-11-28 ENCOUNTER — Emergency Department (INDEPENDENT_AMBULATORY_CARE_PROVIDER_SITE_OTHER)
Admission: EM | Admit: 2011-11-28 | Discharge: 2011-11-28 | Disposition: A | Discharge status: Home / Self Care | Payer: Medicare Other | Source: Home / Self Care | Attending: Family Medicine | Admitting: Family Medicine

## 2011-11-28 DIAGNOSIS — J410 Simple chronic bronchitis: Secondary | ICD-10-CM | POA: Diagnosis not present

## 2011-11-28 DIAGNOSIS — S335XXA Sprain of ligaments of lumbar spine, initial encounter: Secondary | ICD-10-CM | POA: Diagnosis not present

## 2011-11-28 DIAGNOSIS — Z720 Tobacco use: Secondary | ICD-10-CM

## 2011-11-28 DIAGNOSIS — B373 Candidiasis of vulva and vagina: Secondary | ICD-10-CM

## 2011-11-28 DIAGNOSIS — J40 Bronchitis, not specified as acute or chronic: Secondary | ICD-10-CM

## 2011-11-28 DIAGNOSIS — S39012A Strain of muscle, fascia and tendon of lower back, initial encounter: Secondary | ICD-10-CM

## 2011-11-28 HISTORY — DX: Bronchitis, not specified as acute or chronic: J40

## 2011-11-28 LAB — POCT URINALYSIS DIP (DEVICE)
Bilirubin Urine: NEGATIVE
Glucose, UA: NEGATIVE mg/dL
Ketones, ur: NEGATIVE mg/dL
Leukocytes, UA: NEGATIVE
Nitrite: NEGATIVE
Protein, ur: NEGATIVE mg/dL
Specific Gravity, Urine: >=1.03 (ref 1.005–1.030)
Urobilinogen, UA: 0.2 mg/dL (ref 0.0–1.0)
pH: 5.5 (ref 5.0–8.0)

## 2011-11-28 MED ORDER — LEVOFLOXACIN 500 MG PO TABS: 500.0000 mg | ORAL_TABLET | Freq: Every day | ORAL | Status: AC

## 2011-11-28 MED ORDER — ALBUTEROL SULFATE HFA 108 (90 BASE) MCG/ACT IN AERS: 1.0000 | INHALATION_SPRAY | Freq: Four times a day (QID) | RESPIRATORY_TRACT | Status: DC | PRN

## 2011-11-28 MED ORDER — FLUCONAZOLE 150 MG PO TABS: 150.0000 mg | ORAL_TABLET | Freq: Once | ORAL | Status: AC

## 2011-11-28 MED ORDER — TERCONAZOLE 80 MG VA SUPP: 80.0000 mg | Freq: Every day | VAGINAL | Status: AC

## 2011-11-28 MED ORDER — GUAIFENESIN-CODEINE 100-10 MG/5ML PO SYRP: 5.0000 mL | ORAL_SOLUTION | Freq: Three times a day (TID) | ORAL | Status: AC | PRN

## 2011-11-28 NOTE — ED Provider Notes (Signed)
History     CSN: [id number]  Arrival date & time 11/28/11  1629   First MD Initiated Contact with Patient 11/28/11 1715      Chief Complaint  Patient presents with  . Cough  . Dysuria  . Urinary Frequency  . Vaginal Itching  . Vaginal Discharge    (Consider location/radiation/quality/duration/timing/severity/associated sxs/prior treatment) Patient is a 51 y.o. female presenting with cough, dysuria, frequency, vaginal itching, and vaginal discharge. The history is provided by the patient.  Cough This is a recurrent problem. The current episode started more than 1 week ago. The problem has been gradually worsening. The cough is productive of sputum. Associated symptoms include wheezing. Pertinent negatives include no chest pain and no sweats. She is a smoker. Her past medical history is significant for bronchitis.  Dysuria  Associated symptoms include frequency. Pertinent negatives include no sweats.  Urinary Frequency Pertinent negatives include no chest pain.  Vaginal Itching Pertinent negatives include no chest pain.  Vaginal Discharge Pertinent negatives include no chest pain.    Past Medical History  Diagnosis Date  . Diverticulitis   . Fibromyalgia   . Substance abuse     10 years ago ( cocaine)  . Bronchitis     Past Surgical History  Procedure Date  . Bowel obstruction   . Back surgery   . Ankle surgery   . Eye surgery     removed right eye;   Marland Kitchen Ectopic pregnancy surgery     Family History  Problem Relation Age of Onset  . Hypertension Mother   . Arthritis Mother   . Cancer Father     lung    History  Substance Use Topics  . Smoking status: Current Everyday Smoker -- 1.0 packs/day for 18 years    Types: Cigarettes  . Smokeless tobacco: Never Used   Comment: 1-800 number given   . Alcohol Use: No    OB History    Grav Para Term Preterm Abortions TAB SAB Ect Mult Living   5    4 1 2 1  2       Review of Systems  Constitutional: Negative  for fever.  Respiratory: Positive for cough and wheezing.   Cardiovascular: Negative for chest pain.  Gastrointestinal: Negative.   Genitourinary: Positive for dysuria, frequency and vaginal discharge.  Musculoskeletal: Positive for back pain.    Allergies  Penicillins and Sulfonamide derivatives  Home Medications   Current Outpatient Rx  Name Route Sig Dispense Refill  . ALBUTEROL SULFATE HFA 108 (90 BASE) MCG/ACT IN AERS Inhalation Inhale 2 puffs into the lungs every 6 (six) hours as needed.    Marland Kitchen GAS-X PO Oral Take by mouth.    . ALBUTEROL SULFATE HFA 108 (90 BASE) MCG/ACT IN AERS Inhalation Inhale 1-2 puffs into the lungs every 6 (six) hours as needed for wheezing. 1 Inhaler 0  . ASPIRIN 325 MG PO TABS Oral Take 325 mg by mouth daily.      Marland Kitchen CIPROFLOXACIN HCL 500 MG PO TABS Oral Take 500 mg by mouth 2 (two) times daily.      Marland Kitchen FLUCONAZOLE 150 MG PO TABS Oral Take 1 tablet (150 mg total) by mouth once. 1 tablet 0  . GUAIFENESIN-CODEINE 100-10 MG/5ML PO SYRP Oral Take 5 mLs by mouth 3 (three) times daily as needed for cough. 120 mL 0  . LEVOFLOXACIN 500 MG PO TABS Oral Take 1 tablet (500 mg total) by mouth daily. 7 tablet 0  . SIMETHICONE 80 MG  PO CHEW Oral Chew 80 mg by mouth every 6 (six) hours as needed. For stomach     . TERCONAZOLE 80 MG VA SUPP Vaginal Place 1 suppository (80 mg total) vaginally at bedtime. 3 suppository 0    BP 105/75  Pulse 78  Temp 98.6 F (37 C) (Oral)  Resp 18  SpO2 100%  Physical Exam  Nursing note and vitals reviewed. Constitutional: She is oriented to person, place, and time. She appears well-developed and well-nourished.  Eyes: Conjunctivae are normal. Pupils are equal, round, and reactive to light.  Neck: Normal range of motion. Neck supple.  Pulmonary/Chest: She has wheezes. She has rhonchi.  Abdominal: Soft. Bowel sounds are normal.  Neurological: She is alert and oriented to person, place, and time.  Skin: Skin is warm and dry.    ED  Course  Procedures (including critical care time)  Labs Reviewed  POCT URINALYSIS DIP (DEVICE) - Abnormal; Notable for the following:    Hgb urine dipstick TRACE (*)     All other components within normal limits   No results found.   1. Bronchitis due to tobacco use   2. Strain of lumbar paraspinal muscle   3. Candida vaginitis       MDM          Linna Hoff, MD 11/28/11 1904

## 2011-11-28 NOTE — ED Notes (Signed)
Pt with c/o cough and congestion x 5 days - urinary frequency/odor and burning x one week - vaginal discharge and itching yesterday - c/o left flank pain x one month - per pt taking otc mucinex /tylenol

## 2011-11-30 DIAGNOSIS — H5710 Ocular pain, unspecified eye: Secondary | ICD-10-CM | POA: Diagnosis not present

## 2012-01-03 ENCOUNTER — Other Ambulatory Visit (HOSPITAL_COMMUNITY)
Admission: RE | Admit: 2012-01-03 | Discharge: 2012-01-03 | Disposition: A | Discharge status: Home / Self Care | Payer: Medicare Other | Source: Ambulatory Visit | Attending: Obstetrics & Gynecology | Admitting: Obstetrics & Gynecology

## 2012-01-03 ENCOUNTER — Ambulatory Visit (INDEPENDENT_AMBULATORY_CARE_PROVIDER_SITE_OTHER): Payer: Medicare Other | Admitting: Obstetrics & Gynecology

## 2012-01-03 ENCOUNTER — Encounter: Payer: Self-pay | Admitting: Obstetrics & Gynecology

## 2012-01-03 VITALS — BP 112/80 | HR 71 | Temp 98.8°F | Ht 64.0 in | Wt 220.8 lb

## 2012-01-03 DIAGNOSIS — R5383 Other fatigue: Secondary | ICD-10-CM | POA: Diagnosis not present

## 2012-01-03 DIAGNOSIS — N950 Postmenopausal bleeding: Secondary | ICD-10-CM | POA: Insufficient documentation

## 2012-01-03 DIAGNOSIS — R5381 Other malaise: Secondary | ICD-10-CM

## 2012-01-03 DIAGNOSIS — Z1151 Encounter for screening for human papillomavirus (HPV): Secondary | ICD-10-CM | POA: Insufficient documentation

## 2012-01-03 DIAGNOSIS — Z01419 Encounter for gynecological examination (general) (routine) without abnormal findings: Secondary | ICD-10-CM

## 2012-01-03 DIAGNOSIS — Z124 Encounter for screening for malignant neoplasm of cervix: Secondary | ICD-10-CM | POA: Insufficient documentation

## 2012-01-03 LAB — POCT URINALYSIS DIP (DEVICE)
Bilirubin Urine: NEGATIVE
Glucose, UA: NEGATIVE mg/dL
Ketones, ur: NEGATIVE mg/dL
Leukocytes, UA: NEGATIVE
Nitrite: NEGATIVE
Protein, ur: NEGATIVE mg/dL
Specific Gravity, Urine: 1.025 (ref 1.005–1.030)
Urobilinogen, UA: 0.2 mg/dL (ref 0.0–1.0)
pH: 5.5 (ref 5.0–8.0)

## 2012-01-03 NOTE — Addendum Note (Signed)
Addended by: Kathee Delton on: 01/03/2012 04:18 PM   Modules accepted: Orders, Level of Service

## 2012-01-03 NOTE — Addendum Note (Signed)
Addended by: Kathee Delton on: 01/03/2012 04:30 PM   Modules accepted: Orders

## 2012-01-03 NOTE — Patient Instructions (Addendum)
Postmenopausal Bleeding Menopause is commonly referred to as the "change in life." It is a time when the fertile years, the time of ovulating and having menstrual periods, has come to an end. It is also determined by not having menstrual periods for 12 months.  Postmenopausal bleeding is any bleeding a woman has after she has entered into menopause. Any type of postmenopausal bleeding, even if it appears to be a typical menstrual period, is concerning. This should be evaluated by your caregiver.  CAUSES   Hormone therapy.   Cancer of the cervix or cancer of the lining of the uterus (endometrial cancer).   Thinning of the uterine lining (uterine atrophy).   Thyroid diseases.   Certain medicines.   Infection of the uterus or cervix.   Inflammation or irritation of the uterine lining (endometritis).   Estrogen-secreting tumors.   Growths (polyps) on the cervix, uterine lining, or uterus.   Uterine tumors (fibroids).   Being very overweight (obese).  DIAGNOSIS  Your caregiver will take a medical history and ask questions. A physical exam will also be performed. Further tests may include:   A transvaginal ultrasound. An ultrasound wand or probe is inserted into your vagina to view the pelvic organs.   A biopsy of the lining of the uterus (endometrium). A sample of the endometrium is removed and examined.   A hysteroscopy. Your caregiver may use an instrument with a light and a camera attached to it (hysteroscope). The hysteroscope is used to look inside the uterus for problems.   A dilation and curettage (D&C). Tissue is removed from the uterine lining to be examined for problems.  TREATMENT  Treatment depends on the cause of the bleeding. Some treatments include:   Surgery.   Medicines.   Hormones.   A hysteroscopy or D&C to remove polyps or fibroids.   Changing or stopping a current medicine you are taking.  Talk to your caregiver about your specific treatment. HOME CARE  INSTRUCTIONS   Maintain a healthy weight.   Keep regular pelvic exams and Pap tests.  SEEK MEDICAL CARE IF:   You have bleeding, even if it is light in comparison to your previous periods.   Your bleeding lasts more than 1 week.   You have abdominal pain.   You develop bleeding with sexual intercourse.  SEEK IMMEDIATE MEDICAL CARE IF:   You have a fever, chills, headache, dizziness, muscle aches, and bleeding.   You have severe pain with bleeding.   You are passing blood clots.   You have bleeding and need more than 1 pad an hour.   You feel faint.  MAKE SURE YOU:  Understand these instructions.   Will watch your condition.   Will get help right away if you are not doing well or get worse.  Document Released: 09/05/2005 Document Revised: 05/17/2011 Document Reviewed: 02/01/2011 [employer] Patient Information 2012 [employer]. 

## 2012-01-03 NOTE — Progress Notes (Signed)
Pt with a h/o of PMPB x1 after no menses for 5 years. P also c/o fatigue.  Pt with a h/o uterine fibroids.  C/o neck pain anteriorly.  P.E.  Pt in NAD Neck swelling and tenderness.  Mild.  The indications for endometrial biopsy were reviewed.   Risks of the biopsy including cramping, bleeding, infection, uterine perforation, inadequate specimen and need for additional procedures  were discussed. The patient states she understands and agrees to undergo procedure today. Consent was signed. Time out was performed. Urine HCG was negative. A sterile speculum was placed in the patient's vagina and the cervix was prepped with Betadine. A single-toothed tenaculum was placed on the anterior lip of the cervix to stabilize it. The 3 mm pipelle was introduced into the endometrial cavity without difficulty to a depth of 9cm, and a moderate amount of tissue was obtained and sent to pathology. The instruments were removed from the patient's vagina. Minimal bleeding from the cervix was noted. The patient tolerated the procedure well. Routine post-procedure instructions were given to the patient. The patient will follow up to review the results and for further management.  A PAP was also done to r/o cervical causes of bleeding.  F/u results Referral to primary care for eval of fatigue and neck swelling   Alyssa Montgomery, M.D., [facility]

## 2012-01-04 LAB — TSH: TSH: 0.812 u[IU]/mL (ref 0.350–4.500)

## 2012-01-08 ENCOUNTER — Encounter (HOSPITAL_COMMUNITY): Payer: Self-pay | Admitting: Emergency Medicine

## 2012-01-08 ENCOUNTER — Telehealth: Payer: Self-pay | Admitting: *Deleted

## 2012-01-08 ENCOUNTER — Emergency Department (HOSPITAL_COMMUNITY)
Admission: EM | Admit: 2012-01-08 | Discharge: 2012-01-08 | Disposition: A | Discharge status: Home / Self Care | Payer: Medicare Other | Source: Home / Self Care

## 2012-01-08 DIAGNOSIS — J340 Abscess, furuncle and carbuncle of nose: Secondary | ICD-10-CM

## 2012-01-08 DIAGNOSIS — M47812 Spondylosis without myelopathy or radiculopathy, cervical region: Secondary | ICD-10-CM | POA: Diagnosis not present

## 2012-01-08 MED ORDER — MUPIROCIN 2 % EX OINT: TOPICAL_OINTMENT | Freq: Three times a day (TID) | CUTANEOUS | Status: AC

## 2012-01-08 MED ORDER — DOXYCYCLINE HYCLATE 100 MG PO TABS: 100.0000 mg | ORAL_TABLET | Freq: Two times a day (BID) | ORAL | Status: AC

## 2012-01-08 NOTE — ED Notes (Signed)
When d/c instructions brought to room, patient on the phone. Patient gestured to sign.  Instructed patient i must explain / review instructions.

## 2012-01-08 NOTE — Telephone Encounter (Signed)
Message copied by Jill Side on Tue Jan 08, 2012  3:41 PM ------      Message from: Willodean Rosenthal      Created: Tue Jan 08, 2012  3:22 PM       Please pt of normal Endobx.  Rec f/u as scheduled.            clh-S

## 2012-01-08 NOTE — ED Notes (Signed)
Reports Saturday noticing raw area to left nostril, left ear, right ear is stuffy, headache.

## 2012-01-08 NOTE — Discharge Instructions (Signed)
Please return to ED if you develop fever, chills, nausea/vomiting, or worsening nose pain/redness/swelling. Start taking antibiotic x 7 days and apply ointment to both nose and ear three times per day until symptoms improve.  Abscess An abscess (boil or furuncle) is an infected area under your skin. This area is filled with yellowish white fluid (pus). HOME CARE   Only take medicine as told by your doctor.   Keep the skin clean around your abscess. Keep clothes that may touch the abscess clean.   Change any bandages (dressings) as told by your doctor.   Avoid direct skin contact with other people. The infection can spread by skin contact with others.   Practice good hygiene and do not share personal care items.   Do not share athletic equipment, towels, or whirlpools. Shower after every practice or work out session.   If a draining area cannot be covered:   Do not play sports.   Children should not go to daycare until the wound has healed or until fluid (drainage) stops coming out of the wound.   See your doctor for a follow-up visit as told.  GET HELP RIGHT AWAY IF:   There is more pain, puffiness (swelling), and redness in the wound site.   There is fluid or bleeding from the wound site.   You have muscle aches, chills, fever, or feel sick.   You or your child has a temperature by mouth above 102 F (38.9 C), not controlled by medicine.   Your baby is older than 3 months with a rectal temperature of 102 F (38.9 C) or higher.  MAKE SURE YOU:   Understand these instructions.   Will watch your condition.   Will get help right away if you are not doing well or get worse.  Document Released: 11/14/2007 Document Revised: 05/17/2011 Document Reviewed: 11/14/2007 [facility] Patient Information 2012 [place], [place].

## 2012-01-08 NOTE — Telephone Encounter (Signed)
Called pt and left message that I am calling with results information.  Please call back to the nurse voice mail and indicate whether or not this information can be left on your voice mail.

## 2012-01-08 NOTE — ED Provider Notes (Signed)
History     CSN: [id number]  Arrival date & time 01/08/12  1200   First MD Initiated Contact with Patient 01/08/12 1229      Chief Complaint  Patient presents with  . URI    HPI  [facility] with 3 day history of LT ear soreness, nasal congestion, rhinorrhea, facial pain.  Patient says she woke up on "Sunday morning with a red sore spot on LT nostril.  She denies any trauma or injury to nose.  She has been applying Vaseline ointment and cleaning it with peroxide.  She denies any sore throat, cough, chest pain, or SOB.  She denies any decreased appetite.  Patient felt so sick on Sunday and started taking her son's Cipro Rx.  She took 2 tablets of Cipro on Sunday and Monday.  She has also been taking Bayer Aspirin for headache and facial pain.  Patient denies any fevers, nausea or vomiting; does endorse night sweats due to menopause.   Past Medical History  Diagnosis Date  . Diverticulitis   . Fibromyalgia   . Substance abuse     10"  years ago ( cocaine)  . Bronchitis   . Anxiety     Past Surgical History  Procedure Date  . Bowel obstruction   . Back surgery   . Ankle surgery   . Eye surgery     removed right eye;   Marland Kitchen Ectopic pregnancy surgery     Family History  Problem Relation Age of Onset  . Hypertension Mother   . Arthritis Mother   . Cancer Father     lung    History  Substance Use Topics  . Smoking status: Current Everyday Smoker -- 1.0 packs/day for 18 years    Types: Cigarettes  . Smokeless tobacco: Never Used   Comment: 1-800 number given   . Alcohol Use: No    OB History    Grav Para Term Preterm Abortions TAB SAB Ect Mult Living   6 2 2  4 1 2 1  2       Review of Systems  Per HPI  Allergies  Penicillins and Sulfonamide derivatives  Home Medications   Current Outpatient Rx  Name Route Sig Dispense Refill  . CEPHALEXIN 500 MG PO CAPS Oral Take 500 mg by mouth 4 (four) times daily. Took 2 Sunday and 2  yesterday-not patient's    . OVER THE COUNTER MEDICATION  [employer]- over the counter medicine    . ALBUTEROL SULFATE HFA 108 (90 BASE) MCG/ACT IN AERS Inhalation Inhale 2 puffs into the lungs every 6 (six) hours as needed.    . ALBUTEROL SULFATE HFA 108 (90 BASE) MCG/ACT IN AERS Inhalation Inhale 1-2 puffs into the lungs every 6 (six) hours as needed for wheezing. 1 Inhaler 0  . ASPIRIN 325 MG PO TABS Oral Take 325 mg by mouth daily.      Marland Kitchen CIPROFLOXACIN HCL 500 MG PO TABS Oral Take 500 mg by mouth 2 (two) times daily.      Marland Kitchen LIDOCAINE 5 % EX PTCH Transdermal Place 1 patch onto the skin daily. Remove & Discard patch within 12 hours or as directed by MD    . GAS-X PO Oral Take by mouth.    . SIMETHICONE 80 MG PO CHEW Oral Chew 80 mg by mouth every 6 (six) hours as needed. For stomach       BP 133/78  Pulse 80  Temp 98.4 F (36.9 C) (  Oral)  Resp 18  SpO2 96%  Physical Exam  Constitutional: No distress.  HENT:  Head: Normocephalic and atraumatic.  Right Ear: External ear normal.  Left Ear: External ear normal.  Mouth/Throat: Oropharynx is clear and moist. No oropharyngeal exudate.       Nose: LT nostril superficial abrasion and inflamed nasal polyp; clear mucous discharge present LT ear: red, swollen abrasion of external ear canal  Neck: Normal range of motion. Neck supple.  Cardiovascular: Normal rate and normal heart sounds.   No murmur heard. Pulmonary/Chest: Effort normal and breath sounds normal. No respiratory distress. She has no wheezes. She has no rales.  Lymphadenopathy:    She has no cervical adenopathy.  Skin: No rash noted. No erythema.    ED Course  Procedures (including critical care time)  Labs Reviewed - No data to display No results found.   1. Abscess of external nose   2.  Abscess of LT external ear canal    MDM   Abscess of external nose and LT ear canal: Will treat with Doxycycline x 7 days (allergy to PCN and sulfa).  Also recommended Bactroban  ointment TID.  Instructions given for abscess care at home.  Red flags reviewed/signs of systemic infection that require prompt return to ED.  Patient agreed/understood plan.      Barnabas Lister, MD 01/08/12 [phone]

## 2012-01-09 NOTE — Telephone Encounter (Signed)
Pt left message yesterday that her test results information can be left on her voice mail. I called pt this morning and informed her of Endo bx and TSH results are normal.  I advised pt to follow up with her PCP for fatigue and neck swelling as recommended by Dr. Erin Fulling.  Pt does not have a PCP but a referral was made to [facility]. Pt stated that she mailed in her paperwork to [facility] last Friday.  I gave pt their contact number and advised her to call them later this week to insure that her info had been received. Pt voiced understanding of all information and instructions.

## 2012-01-10 NOTE — ED Provider Notes (Signed)
Medical screening examination/treatment/procedure(s) were performed by a resident physician and as supervising physician I was immediately available for consultation/collaboration.  Additionally, I saw the patient independently, verified the history, examined the patient and discussed the treatment plan with the resident.  She appears to have a small furuncles of the external ear canal and the nose simultaneously.  Leslee Home, M.D.    Reuben Likes, MD 01/10/12 2015

## 2012-01-11 DIAGNOSIS — M47812 Spondylosis without myelopathy or radiculopathy, cervical region: Secondary | ICD-10-CM | POA: Diagnosis not present

## 2012-01-18 ENCOUNTER — Ambulatory Visit (INDEPENDENT_AMBULATORY_CARE_PROVIDER_SITE_OTHER): Payer: Medicare Other | Admitting: Family Medicine

## 2012-01-18 ENCOUNTER — Encounter: Payer: Self-pay | Admitting: Family Medicine

## 2012-01-18 VITALS — BP 118/101 | HR 80 | Temp 98.1°F | Ht 64.0 in | Wt 222.0 lb

## 2012-01-18 DIAGNOSIS — F329 Major depressive disorder, single episode, unspecified: Secondary | ICD-10-CM

## 2012-01-18 DIAGNOSIS — Z7689 Persons encountering health services in other specified circumstances: Secondary | ICD-10-CM

## 2012-01-18 DIAGNOSIS — F17200 Nicotine dependence, unspecified, uncomplicated: Secondary | ICD-10-CM

## 2012-01-18 DIAGNOSIS — Z720 Tobacco use: Secondary | ICD-10-CM

## 2012-01-18 DIAGNOSIS — Z7189 Other specified counseling: Secondary | ICD-10-CM

## 2012-01-18 DIAGNOSIS — E785 Hyperlipidemia, unspecified: Secondary | ICD-10-CM | POA: Diagnosis not present

## 2012-01-18 HISTORY — DX: Tobacco use: Z72.0

## 2012-01-18 MED ORDER — QUETIAPINE FUMARATE 100 MG PO TABS: ORAL_TABLET | ORAL | Status: DC

## 2012-01-18 MED ORDER — ALBUTEROL SULFATE HFA 108 (90 BASE) MCG/ACT IN AERS: 2.0000 | INHALATION_SPRAY | Freq: Four times a day (QID) | RESPIRATORY_TRACT | Status: DC | PRN
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
## 2012-01-18 NOTE — Assessment & Plan Note (Signed)
Chronic Bronchitis - inhaler given x1 will investigate further on follow up visit for possible COPD dx - Smoker: Smoking cessation reviewed and patient is willing to see Dr. Raymondo Band and attempt to quit - Depression: H/O of depression, at one point on Effexor to control. MDQ screening today positive for Bipolar disorder    - CC'd Dr. Pascal Lux, prescribed Seroquel. - Patient is a recovering cocaine and crack addict, clean for 10 years. - Enlarged Thyroid - TSH - Fatigue: CBC, CMP, TSH - Screening: Lipid profile - Labs: CBC, CMP, TSH, and lipid panel future orders she will get next week. - F/U: in 2-3 weeks to discuss medications and lab results

## 2012-01-18 NOTE — Assessment & Plan Note (Signed)
-   Depression: H/O of depression, at one point on Effexor to control. MDQ screening today positive for Bipolar disorder    - CC'd Dr. Pascal Lux, prescribed Seroquel. - Patient is a recovering cocaine and crack addict, clean for 10 years. - Enlarged Thyroid - TSH - Fatigue: CBC, CMP, TSH - Labs: CBC, CMP, TSH, and lipid panel future orders she will get next week. - F/U: in 2-3 weeks to discuss medications and lab results

## 2012-01-18 NOTE — Assessment & Plan Note (Addendum)
New patient: - Chronic Bronchitis - inhaler given x1 will investigate further on follow up visit for possible COPD dx - Smoker: Smoking cessation reviewed and patient is willing to see Dr. Raymondo Band and attempt to quit - Depression: H/O of depression, at one point on Effexor to control. MDQ screening today positive for Bipolar disorder    - CC'd Dr. Pascal Lux, prescribed Seroquel. - Patient is a recovering cocaine and crack addict, clean for 10 years. - Enlarged Thyroid - TSH - Fatigue: CBC, CMP, TSH - Screening: Lipid profile - Labs: CBC, CMP, TSH, and lipid panel future orders she will get next week. - F/U: in 2-3 weeks to discuss medications and lab results

## 2012-01-18 NOTE — Patient Instructions (Addendum)
Depression  Depression is a strong emotion of feeling unhappy that can last for weeks, months, or even longer. Depression causes problems with the ability to function in life. It upsets your:   Relationships.   Sleep.   Eating habits.   Work habits.  HOME CARE  Take all medicine as told by your doctor.   Talk with a therapist, counselor, or friend.   Eat a healthy diet.   Exercise regularly.   Do not drink alcohol or use drugs.  GET HELP RIGHT AWAY IF: You start to have thoughts about hurting yourself or others. MAKE SURE YOU:  Understand these instructions.   Will watch your condition.   Will get help right away if you are not doing well or get worse.  Document Released: 06/30/2010 Document Revised: 05/17/2011 Document Reviewed: 06/30/2010 [employer] Patient Information 2012 [employer]. 

## 2012-01-22 ENCOUNTER — Other Ambulatory Visit: Payer: Medicare Other

## 2012-01-22 DIAGNOSIS — F329 Major depressive disorder, single episode, unspecified: Secondary | ICD-10-CM

## 2012-01-22 DIAGNOSIS — F3289 Other specified depressive episodes: Secondary | ICD-10-CM | POA: Diagnosis not present

## 2012-01-22 DIAGNOSIS — Z7689 Persons encountering health services in other specified circumstances: Secondary | ICD-10-CM

## 2012-01-22 DIAGNOSIS — Z7189 Other specified counseling: Secondary | ICD-10-CM | POA: Diagnosis not present

## 2012-01-22 DIAGNOSIS — E785 Hyperlipidemia, unspecified: Secondary | ICD-10-CM | POA: Diagnosis not present

## 2012-01-22 DIAGNOSIS — M47812 Spondylosis without myelopathy or radiculopathy, cervical region: Secondary | ICD-10-CM | POA: Diagnosis not present

## 2012-01-22 DIAGNOSIS — M25569 Pain in unspecified knee: Secondary | ICD-10-CM | POA: Diagnosis not present

## 2012-01-22 LAB — CBC WITH DIFFERENTIAL/PLATELET
Basophils Absolute: 0 10*3/uL (ref 0.0–0.1)
Basophils Relative: 1 % (ref 0–1)
Eosinophils Absolute: 0.1 10*3/uL (ref 0.0–0.7)
Eosinophils Relative: 2 % (ref 0–5)
HCT: 38.6 % (ref 36.0–46.0)
Hemoglobin: 12.1 g/dL (ref 12.0–15.0)
Lymphocytes Relative: 40 % (ref 12–46)
Lymphs Abs: 1.8 10*3/uL (ref 0.7–4.0)
MCH: 25.8 pg — ABNORMAL LOW (ref 26.0–34.0)
MCHC: 31.3 g/dL (ref 30.0–36.0)
MCV: 82.3 fL (ref 78.0–100.0)
Monocytes Absolute: 0.4 10*3/uL (ref 0.1–1.0)
Monocytes Relative: 9 % (ref 3–12)
Neutro Abs: 2.2 10*3/uL (ref 1.7–7.7)
Neutrophils Relative %: 48 % (ref 43–77)
Platelets: 241 10*3/uL (ref 150–400)
RBC: 4.69 MIL/uL (ref 3.87–5.11)
RDW: 15.4 % (ref 11.5–15.5)
WBC: 4.5 10*3/uL (ref 4.0–10.5)

## 2012-01-22 LAB — LIPID PANEL
Cholesterol: 178 mg/dL (ref 0–200)
HDL: 54 mg/dL (ref >39)
LDL Cholesterol: 102 mg/dL — ABNORMAL HIGH (ref 0–99)
Total CHOL/HDL Ratio: 3.3 Ratio
Triglycerides: 111 mg/dL (ref <150)
VLDL: 22 mg/dL (ref 0–40)

## 2012-01-22 LAB — COMPREHENSIVE METABOLIC PANEL
ALT: 21 U/L (ref 0–35)
AST: 17 U/L (ref 0–37)
Albumin: 3.8 g/dL (ref 3.5–5.2)
Alkaline Phosphatase: 105 U/L (ref 39–117)
BUN: 17 mg/dL (ref 6–23)
CO2: 26 mEq/L (ref 19–32)
Calcium: 8.9 mg/dL (ref 8.4–10.5)
Chloride: 105 mEq/L (ref 96–112)
Creat: 0.77 mg/dL (ref 0.50–1.10)
Glucose, Bld: 93 mg/dL (ref 70–99)
Potassium: 3.9 mEq/L (ref 3.5–5.3)
Sodium: 140 mEq/L (ref 135–145)
Total Bilirubin: 0.2 mg/dL — ABNORMAL LOW (ref 0.3–1.2)
Total Protein: 6.4 g/dL (ref 6.0–8.3)

## 2012-01-22 LAB — TSH: TSH: 1.01 u[IU]/mL (ref 0.350–4.500)

## 2012-01-22 NOTE — Progress Notes (Signed)
CMP,CBC WITH DIFF,FLP AND TSH DONE TODAY Alyssa Montgomery 

## 2012-01-23 ENCOUNTER — Encounter: Payer: Self-pay | Admitting: Family Medicine

## 2012-01-25 DIAGNOSIS — M25569 Pain in unspecified knee: Secondary | ICD-10-CM | POA: Diagnosis not present

## 2012-02-01 DIAGNOSIS — M25569 Pain in unspecified knee: Secondary | ICD-10-CM | POA: Diagnosis not present

## 2012-02-05 DIAGNOSIS — M47812 Spondylosis without myelopathy or radiculopathy, cervical region: Secondary | ICD-10-CM | POA: Diagnosis not present

## 2012-02-07 DIAGNOSIS — M25569 Pain in unspecified knee: Secondary | ICD-10-CM | POA: Diagnosis not present

## 2012-02-08 ENCOUNTER — Telehealth: Payer: Self-pay | Admitting: Family Medicine

## 2012-02-08 ENCOUNTER — Encounter: Payer: Self-pay | Admitting: Family Medicine

## 2012-02-08 ENCOUNTER — Ambulatory Visit (INDEPENDENT_AMBULATORY_CARE_PROVIDER_SITE_OTHER): Payer: Medicare Other | Admitting: Family Medicine

## 2012-02-08 VITALS — BP 120/65 | HR 85 | Temp 98.1°F | Ht 64.0 in | Wt 219.6 lb

## 2012-02-08 DIAGNOSIS — F329 Major depressive disorder, single episode, unspecified: Secondary | ICD-10-CM | POA: Diagnosis not present

## 2012-02-08 DIAGNOSIS — F17200 Nicotine dependence, unspecified, uncomplicated: Secondary | ICD-10-CM | POA: Diagnosis not present

## 2012-02-08 DIAGNOSIS — Z720 Tobacco use: Secondary | ICD-10-CM

## 2012-02-08 MED ORDER — QUETIAPINE FUMARATE 100 MG PO TABS: 300.0000 mg | ORAL_TABLET | Freq: Every day | ORAL | Status: DC

## 2012-02-08 NOTE — Patient Instructions (Addendum)
Calorie Counting Diet A calorie counting diet requires you to eat the number of calories that are right for you in a day. Calories are the measurement of how much energy you get from the food you eat. Eating the right amount of calories is important for staying at a healthy weight. If you eat too many calories, your body will store them as fat and you may gain weight. If you eat too few calories, you may lose weight. Counting the number of calories you eat during a day will help you know if you are eating the right amount. A Registered Dietitian can determine how many calories you need in a day. The amount of calories needed varies from person to person. If your goal is to lose weight, you will need to eat fewer calories. Losing weight can benefit you if you are overweight or have health problems such as heart disease, high blood pressure, or diabetes. If your goal is to gain weight, you will need to eat more calories. Gaining weight may be necessary if you have a certain health problem that causes your body to need more energy. TIPS Whether you are increasing or decreasing the number of calories you eat during a day, it may be hard to get used to changes in what you eat and drink. The following are tips to help you keep track of the number of calories you eat.  Measure foods at home with measuring cups. This helps you know the amount of food and number of calories you are eating.   Restaurants often serve food in amounts that are larger than 1 serving. While eating out, estimate how many servings of a food you are given. For example, a serving of cooked rice is  cup or about the size of half of a fist. Knowing serving sizes will help you be aware of how much food you are eating at restaurants.   Ask for smaller portion sizes or child-size portions at restaurants.   Plan to eat half of a meal at a restaurant. Take the rest home or share the other half with a friend.   Read the Nutrition Facts panel on  food labels for calorie content and serving size. You can find out how many servings are in a package, the size of a serving, and the number of calories each serving has.   For example, a package might contain 3 cookies. The Nutrition Facts panel on that package says that 1 serving is 1 cookie. Below that, it will say there are 3 servings in the container. The calories section of the Nutrition Facts label says there are 90 calories. This means there are 90 calories in 1 cookie (1 serving). If you eat 1 cookie you have eaten 90 calories. If you eat all 3 cookies, you have eaten 270 calories (3 servings x 90 calories = 270 calories).  The list below tells you how big or small some common portion sizes are.  1 oz.........4 stacked dice.   3 oz.........Deck of cards.   1 tsp........Tip of little finger.   1 tbs........Thumb.   2 tbs........Golf ball.    cup.......Half of a fist.   1 cup........A fist.  KEEP A FOOD LOG Write down every food item you eat, the amount you eat, and the number of calories in each food you eat during the day. At the end of the day, you can add up the total number of calories you have eaten. It may help to keep a   list like the one below. Find out the calorie information by reading the Nutrition Facts panel on food labels. Breakfast  Bran cereal (1 cup, 110 calories).   Fat-free milk ( cup, 45 calories).  Snack  Apple (1 medium, 80 calories).  Lunch  Spinach (1 cup, 20 calories).   Tomato ( medium, 20 calories).   Chicken breast strips (3 oz, 165 calories).   Shredded cheddar cheese ( cup, 110 calories).   Light [place] dressing (2 tbs, 60 calories).   Whole-wheat bread (1 slice, 80 calories).   Tub margarine (1 tsp, 35 calories).   Vegetable soup (1 cup, 160 calories).  Dinner  Pork chop (3 oz, 190 calories).   Brown rice (1 cup, 215 calories).   Steamed broccoli ( cup, 20 calories).   Strawberries (1  cup, 65 calories).   Whipped  cream (1 tbs, 50 calories).  Daily Calorie Total: 1425 Document Released: 05/28/2005 Document Revised: 05/17/2011 Document Reviewed: 11/22/2006 [facility] Patient Information 2012 [place], [place].  Exercise to Lose Weight Exercise and a healthy diet may help you lose weight. Your doctor may suggest specific exercises. EXERCISE IDEAS AND TIPS  Choose low-cost things you enjoy doing, such as walking, bicycling, or exercising to workout videos.   Take stairs instead of the elevator.   Walk during your lunch break.   Park your car further away from work or school.   Go to a gym or an exercise class.   Start with 5 to 10 minutes of exercise each day. Build up to 30 minutes of exercise 4 to 6 days a week.   Wear shoes with good support and comfortable clothes.   Stretch before and after working out.   Work out until you breathe harder and your heart beats faster.   Drink extra water when you exercise.   Do not do so much that you hurt yourself, feel dizzy, or get very short of breath.  Exercises that burn about 150 calories:  Running 1  miles in 15 minutes.   Playing volleyball for 45 to 60 minutes.   Washing and waxing a car for 45 to 60 minutes.   Playing touch football for 45 minutes.   Walking 1  miles in 35 minutes.   Pushing a stroller 1  miles in 30 minutes.   Playing basketball for 30 minutes.   Raking leaves for 30 minutes.   Bicycling 5 miles in 30 minutes.   Walking 2 miles in 30 minutes.   Dancing for 30 minutes.   Shoveling snow for 15 minutes.   Swimming laps for 20 minutes.   Walking up stairs for 15 minutes.   Bicycling 4 miles in 15 minutes.   Gardening for 30 to 45 minutes.   Jumping rope for 15 minutes.   Washing windows or floors for 45 to 60 minutes.  Document Released: 06/30/2010 Document Revised: 05/17/2011 Document Reviewed: 06/30/2010 [facility] Patient Information 2012 [place], [place].

## 2012-02-08 NOTE — Telephone Encounter (Signed)
Mistake to open

## 2012-02-08 NOTE — Assessment & Plan Note (Signed)
-   Patient has had great improvement on Seroquel. She is happy with her medication and how she is feeling.  - She would like to talk with Dr. Pascal Lux.  - Dr. Carola Rhine card given to her today - F/U: 6 months

## 2012-02-08 NOTE — Progress Notes (Signed)
Subjective:     Patient ID: Alyssa Montgomery, female   DOB: 12-18-1960, 51 y.o.   MRN: [id number]  HPI Depression: "Feeling good." She reports she is doing much better since her medication has started. She has energy, she able to sleep better, she is more active and wanting to partake in activities. She does not feel down or depressed at all. She is having no more problems with concentration. She does feel some guilt now that she is self reflecting on her life and how she did not raise her children well with her addiction history. She feels she could have been a great mom and she missed out and now her kids are paying for it. She is willing and wanting to speak with Alyssa Montgomery.  Review of Systems See above HPI    Objective:   Physical Exam  Constitutional: She is oriented to person, place, and time. She appears well-developed and well-nourished. No distress.       [name] looks great today. She has on make-up, fixed her hair and is dressed very nicely. She is happy and in great spirits.   Neck: Normal range of motion. Neck supple. No tracheal deviation present. No thyromegaly present.  Cardiovascular: Normal rate and regular rhythm.   Murmur heard. Pulmonary/Chest: Effort normal. No stridor. She has no wheezes. She has rales.  Lymphadenopathy:    She has no cervical adenopathy.  Neurological: She is alert and oriented to person, place, and time.  Psychiatric: She has a normal mood and affect. Her behavior is normal. Judgment and thought content normal.   Mammogram slip given today

## 2012-02-08 NOTE — Assessment & Plan Note (Signed)
-   Just about ready, thinks she will make appointment with [facility]

## 2012-02-18 DIAGNOSIS — M25819 Other specified joint disorders, unspecified shoulder: Secondary | ICD-10-CM | POA: Diagnosis not present

## 2012-03-14 ENCOUNTER — Other Ambulatory Visit: Payer: Self-pay | Admitting: Family Medicine

## 2012-03-14 DIAGNOSIS — Z1231 Encounter for screening mammogram for malignant neoplasm of breast: Secondary | ICD-10-CM

## 2012-03-27 ENCOUNTER — Ambulatory Visit (HOSPITAL_COMMUNITY): Payer: Medicare Other

## 2012-04-01 DIAGNOSIS — M545 Low back pain, unspecified: Secondary | ICD-10-CM | POA: Diagnosis not present

## 2012-04-10 ENCOUNTER — Ambulatory Visit (HOSPITAL_COMMUNITY)
Admission: RE | Admit: 2012-04-10 | Discharge: 2012-04-10 | Disposition: A | Discharge status: Home / Self Care | Payer: Medicare Other | Source: Ambulatory Visit | Attending: Family Medicine | Admitting: Family Medicine

## 2012-04-10 DIAGNOSIS — M545 Low back pain, unspecified: Secondary | ICD-10-CM | POA: Diagnosis not present

## 2012-04-10 DIAGNOSIS — Z1231 Encounter for screening mammogram for malignant neoplasm of breast: Secondary | ICD-10-CM | POA: Insufficient documentation

## 2012-04-10 DIAGNOSIS — M25819 Other specified joint disorders, unspecified shoulder: Secondary | ICD-10-CM | POA: Diagnosis not present

## 2012-04-28 ENCOUNTER — Ambulatory Visit (INDEPENDENT_AMBULATORY_CARE_PROVIDER_SITE_OTHER): Payer: Medicare Other | Admitting: Family Medicine

## 2012-04-28 ENCOUNTER — Encounter: Payer: Self-pay | Admitting: Family Medicine

## 2012-04-28 VITALS — BP 108/76 | HR 74 | Temp 98.2°F | Ht 64.0 in | Wt 221.0 lb

## 2012-04-28 DIAGNOSIS — A088 Other specified intestinal infections: Secondary | ICD-10-CM | POA: Diagnosis not present

## 2012-04-28 DIAGNOSIS — A084 Viral intestinal infection, unspecified: Secondary | ICD-10-CM | POA: Insufficient documentation

## 2012-04-28 NOTE — Patient Instructions (Addendum)
It was nice to meet you today.  I think that you have a virus that is causing all of your symptoms.  I want you to try to AVOID juice and sodas because of the sugar.  You should also avoid fatty foods.  Try to eat anything that sounds appealing to you.  You can mix gatorade and water to help keep yourself hydrated.  If you think you can, you can try some yogurt to help get the good bacteria back in your stomach.  Please come back to clinic before the end of the week unless you are 110% better.  Please go to the ER if you start having vomiting, especially if it is green or yellow, stop having bowel movements but feel like your belly is getting more swollen, start having fevers, have worsening pain, or start having blood in your diarrhea.   Viral Gastroenteritis Viral gastroenteritis is also known as stomach flu. This condition affects the stomach and intestinal tract. It can cause sudden diarrhea and vomiting. The illness typically lasts 3 to 8 days. Most people develop an immune response that eventually gets rid of the virus. While this natural response develops, the virus can make you quite ill. CAUSES  Many different viruses can cause gastroenteritis, such as rotavirus or noroviruses. You can catch one of these viruses by consuming contaminated food or water. You may also catch a virus by sharing utensils or other personal items with an infected person or by touching a contaminated surface. SYMPTOMS  The most common symptoms are diarrhea and vomiting. These problems can cause a severe loss of body fluids (dehydration) and a body salt (electrolyte) imbalance. Other symptoms may include:  Fever.  Headache.  Fatigue.  Abdominal pain. DIAGNOSIS  Your caregiver can usually diagnose viral gastroenteritis based on your symptoms and a physical exam. A stool sample may also be taken to test for the presence of viruses or other infections. TREATMENT  This illness typically goes away on its own.  Treatments are aimed at rehydration. The most serious cases of viral gastroenteritis involve vomiting so severely that you are not able to keep fluids down. In these cases, fluids must be given through an intravenous line (IV). HOME CARE INSTRUCTIONS   Drink enough fluids to keep your urine clear or pale yellow. Drink small amounts of fluids frequently and increase the amounts as tolerated.  Ask your caregiver for specific rehydration instructions.  Avoid:  Foods high in sugar.  Alcohol.  Carbonated drinks.  Tobacco.  Juice.  Caffeine drinks.  Extremely hot or cold fluids.  Fatty, greasy foods.  Too much intake of anything at one time.  Dairy products until 24 to 48 hours after diarrhea stops.  You may consume probiotics. Probiotics are active cultures of beneficial bacteria. They may lessen the amount and number of diarrheal stools in adults. Probiotics can be found in yogurt with active cultures and in supplements.  Wash your hands well to avoid spreading the virus.  Only take over-the-counter or prescription medicines for pain, discomfort, or fever as directed by your caregiver. Do not give aspirin to children. Antidiarrheal medicines are not recommended.  Ask your caregiver if you should continue to take your regular prescribed and over-the-counter medicines.  Keep all follow-up appointments as directed by your caregiver. SEEK IMMEDIATE MEDICAL CARE IF:   You are unable to keep fluids down.  You do not urinate at least once every 6 to 8 hours.  You develop shortness of breath.  You notice  blood in your stool or vomit. This may look like coffee grounds.  You have abdominal pain that increases or is concentrated in one small area (localized).  You have persistent vomiting or diarrhea.  You have a fever.  The patient is a child younger than 3 months, and he or she has a fever.  The patient is a child older than 3 months, and he or she has a fever and  persistent symptoms.  The patient is a child older than 3 months, and he or she has a fever and symptoms suddenly get worse.  The patient is a baby, and he or she has no tears when crying. MAKE SURE YOU:   Understand these instructions.  Will watch your condition.  Will get help right away if you are not doing well or get worse. Document Released: 05/28/2005 Document Revised: 08/20/2011 Document Reviewed: 03/14/2011 [facility] Patient Information 2013 [place], [place].

## 2012-04-28 NOTE — Progress Notes (Signed)
S: Pt comes in today for SDA for diarrhea and heartburn.  Pt has PMHx singificant for GERD and diverticulitis; has had surgery for "bowels twisting up" x2 in 1980's.  Started having diarrhea 4 days ago.  Started having bad heartburn yesterday when drinking Pepto Bismol.  Nauseated yesterday with 1 episode of vomiting mucus. Diarrhea is really watery with minimal stool in it, no blood.  No recent travel or new exposures.  Is a CMA.  Dog may also be a little sick.  Husband has not had any GI problems, but he had a bad cold last week with diarrhea.  Also having some abdominal pain-- feels like she can feel her bowels moving.  Last episode of diarrhea was right before she got here.  Is also very gassy-- having lots of belching x2 days.  No fevers/chills.  +cough and phlegm. + rhinorrhea.   Took some Alka seltzer, which just settles her stomach but makes heartburn worse.  Tried apple cider vinegar last night which helped the heartburn a little.  Took a bunch of gas-x last week because she was having gas; has not taken any in the past few days.     ROS: Per HPI  History  Smoking status  . Current Every Day Smoker -- 1.0 packs/day for 18 years  . Types: Cigarettes  Smokeless tobacco  . Never Used    Comment: 1-800 number given     O:  Filed Vitals:   04/28/12 1348  BP: 108/76  Pulse: 74  Temp: 98.2 F (36.8 C)    Gen: NAD, obese HEETN: MMM CV: RRR, no murmur Pulm: CTA bilat, no wheezes or crackles Abd: soft, mild diffuse TTP, nondistended, normoactive BS; well healed midline scar   A/P: 51 y.o. female p/w presumed viral gastro -See problem list -f/u in 3 days

## 2012-04-28 NOTE — Assessment & Plan Note (Signed)
No red flags on history or exam today but some increased concern with patient's history of what sounds like 2 bowel obstructions.  Given dietary suggestions.  Red flags for ER discussed. F/u later this week for recheck to ensure no abd distension.

## 2012-06-19 ENCOUNTER — Other Ambulatory Visit: Payer: Self-pay | Admitting: Family Medicine

## 2012-06-19 ENCOUNTER — Ambulatory Visit (INDEPENDENT_AMBULATORY_CARE_PROVIDER_SITE_OTHER): Payer: Medicare Other | Admitting: Family Medicine

## 2012-06-19 ENCOUNTER — Encounter: Payer: Self-pay | Admitting: Family Medicine

## 2012-06-19 VITALS — BP 107/70 | HR 80 | Temp 98.0°F | Ht 64.0 in | Wt 219.0 lb

## 2012-06-19 DIAGNOSIS — Z23 Encounter for immunization: Secondary | ICD-10-CM

## 2012-06-19 DIAGNOSIS — N390 Urinary tract infection, site not specified: Secondary | ICD-10-CM

## 2012-06-19 DIAGNOSIS — R319 Hematuria, unspecified: Secondary | ICD-10-CM

## 2012-06-19 DIAGNOSIS — M549 Dorsalgia, unspecified: Secondary | ICD-10-CM | POA: Insufficient documentation

## 2012-06-19 HISTORY — DX: Urinary tract infection, site not specified: N39.0

## 2012-06-19 LAB — POCT URINALYSIS DIPSTICK
Bilirubin, UA: NEGATIVE
Glucose, UA: NEGATIVE
Nitrite, UA: NEGATIVE
Protein, UA: 100
Spec Grav, UA: >=1.03
Urobilinogen, UA: 0.2
pH, UA: 5.5

## 2012-06-19 LAB — POCT UA - MICROSCOPIC ONLY

## 2012-06-19 MED ORDER — CEPHALEXIN 500 MG PO CAPS: 500.0000 mg | ORAL_CAPSULE | Freq: Two times a day (BID) | ORAL | Status: DC

## 2012-06-19 MED ORDER — IBUPROFEN 600 MG PO TABS: 600.0000 mg | ORAL_TABLET | Freq: Three times a day (TID) | ORAL | Status: DC

## 2012-06-19 MED ORDER — DICLOFENAC SODIUM 1 % TD GEL: 2.0000 g | Freq: Two times a day (BID) | TRANSDERMAL | Status: DC | PRN

## 2012-06-19 NOTE — Assessment & Plan Note (Signed)
A: Likely acute muscle strain and/or possible exacerbation of existing degenerative disease, especially given history of previous back surgery. No definite objective findings of radiculopathy and no definite red flags in history. P: Advised scheduled ibuprofen 600 mg TID for 1-2 weeks, as well as hot and/or cold compresses. Reviewed red flags and when to return to clinic or report to ED. Pt to follow up in 1-2 weeks with PCP; may need to consider imaging and/or formal referral (?re-referral) to physical therapy or orthopedics; pt filled out release of information to request records from [facility].

## 2012-06-19 NOTE — Patient Instructions (Signed)
Thank you for coming in, today. It was nice to meet you. I think you have a UTI. I am going to treat you with Keflex (cephalexin) for 7 days. Take 1 500 mg tablet twice a day until you finish all the pills For your back pain, I want you to take ibuprofen 600 mg three times a day for 1 week. You can also use warm compresses and/or ice (15 minutes on, 15 minutes off). I would like you to come back in one to two weeks to see how your back pain is doing. If you continue to have back pain or if it gets worse, we might refer you to the orthopedic doctor and/or sports medicine. If you have any of the following, call the clinic or go to the emergency room:    Loss of bowel or bladder control.    Numbness in a "saddle" area (in your groin and the insides of your legs or buttocks)    Fever over 100.3 that doesn't go down or that comes back even with taking Tylenol.    Persistent nausea and vomiting.

## 2012-06-19 NOTE — Assessment & Plan Note (Signed)
Gross per pt description and microscopic on UA. Likely related to presumed UTI, though kidney stone also on differential, as well as intrinsic renal disease (very broad differential). Should be monitored/followed up with back pain and resolution of UTI.

## 2012-06-19 NOTE — Telephone Encounter (Signed)
Pt is asking for ibuprofen 600 also called to [facility] [address]

## 2012-06-19 NOTE — Assessment & Plan Note (Addendum)
A: Evidenced by findings on UA and pain on urination, with CVA tenderness. However, no fever or other systemic symptoms. Culture pending. DDx also includes more of a cystitis picture (?subacute, given more "internal" pain and no real burning on urination, with history of pelvic pain), or kidney stone given back pain. P: Will treat empirically with Keflex for 10 days. Pt to follow up closely for back pain; should evaluate for any further dysuria/hematuria at that time. If culture results resistant to Keflex, will switch to appropriate antibiotic. Pt (and family members) likely needs reinforcement of the ideas of finishing prescriptions and not taking medications prescribed for other people.

## 2012-06-19 NOTE — Telephone Encounter (Signed)
Rx sent in to pt's pharmacy. Thanks! --CMS

## 2012-06-19 NOTE — Progress Notes (Signed)
  Subjective:    Patient ID: Alyssa Montgomery, female    DOB: 28-Mar-1961, 52 y.o.   MRN: [id number]  HPI: Pt complains of back pain for 5 days. Also reporting bleeding in her urine, yesterday. No clots, but urine and on the paper when she wiped was a bright red/pink blood and a "large amount" in the toilet. She also states it felt like "someone was pulling my insides" when I urinated," but it was not a burning pain. She has never had pain like this, though she does state she has had UTI's before and believes she has one, currently; she states she took a Cipro that her son had left over from a previous prescription. No vaginal bleeding and no gross bleeding today when she urinated. No vaginal discharge, states she is in a monogamous relationship. No hx of kidney stones.  Back pain: Almost entirely on there right, in her low back, in buttock, and down her leg into her calf. Pt states she picked up a heavy load of laundry yesterday and felt a "twinge," and felt a pop in her back when bending over at work, today (works as a Psychologist, clinical at [employer]). Pt described as a constant sore ache. She states yesterday she took a hydrocodone that her husband was previously prescribed. She also describes some left leg numbness but is slightly vague in the description of her pain; she states she saw Dr. Shon Baton at [facility] a few months ago and was told her leg symptoms were "more in her back." She has used Voltaren gel in the past with good relief. She had an L4-L5 back surgery about 9 years ago by Dr. Ophelia Charter.  Review of Systems: As above. No fever/chills, no vomiting but some nausea and abdominal pain. Some congestion nasal and in her chest recently, which seems to be clearing on its own.     Objective:   Physical Exam BP 107/70  Pulse 80  Temp 98 F (36.7 C)  Ht 5\' 4"  (1.626 m)  Wt 219 lb (99.338 kg)  BMI 37.59 kg/m2 Gen: adult female in NAD, some pressured speech and uncomfortable with movement but ambulates  without assistance Pulm: CTAB, no wheezes Cardio: RRR, no murmur appreciated Abd: mild lower abdominal/suprapubic tenderness, otherwise soft, BS+ MSK: moderate paraspinal tenderness to palpation in the low back when sitting/standing, worse on right  Mild CVA tenderness on right, moderate on left  Some antalgic gait/movements, but able to sit/stand without assistance  Unable to lie flat more than 10-20 seconds due to pain, tending to lean up off her left side/onto her right hip  Equivocal straight leg lift test on left   some muscle tightness in her back when hip flexed with knee held at 90 degrees, then straightened   Pain in left leg down to calf, but present prior to leg lift and no definite new pain/tingling in leg after leg lift  Negative for pain or radicular pain on equivalent maneuver on right Neuro: Equal strength bilaterally in upper and lower limbs Ext: moves all extremities equally, warm, well-perfused     Assessment & Plan:

## 2012-06-21 LAB — URINE CULTURE
Colony Count: NO GROWTH
Organism ID, Bacteria: NO GROWTH

## 2012-11-10 ENCOUNTER — Other Ambulatory Visit (HOSPITAL_COMMUNITY)
Admission: RE | Admit: 2012-11-10 | Discharge: 2012-11-10 | Disposition: A | Discharge status: Home / Self Care | Payer: Medicare Other | Source: Ambulatory Visit | Attending: Family Medicine | Admitting: Family Medicine

## 2012-11-10 ENCOUNTER — Encounter: Payer: Self-pay | Admitting: Family Medicine

## 2012-11-10 ENCOUNTER — Ambulatory Visit (INDEPENDENT_AMBULATORY_CARE_PROVIDER_SITE_OTHER): Payer: Medicare Other | Admitting: Family Medicine

## 2012-11-10 VITALS — BP 143/82 | HR 75 | Temp 98.5°F | Ht 64.0 in | Wt 219.0 lb

## 2012-11-10 DIAGNOSIS — N949 Unspecified condition associated with female genital organs and menstrual cycle: Secondary | ICD-10-CM

## 2012-11-10 DIAGNOSIS — Z113 Encounter for screening for infections with a predominantly sexual mode of transmission: Secondary | ICD-10-CM | POA: Insufficient documentation

## 2012-11-10 DIAGNOSIS — G8929 Other chronic pain: Secondary | ICD-10-CM

## 2012-11-10 DIAGNOSIS — R3989 Other symptoms and signs involving the genitourinary system: Secondary | ICD-10-CM

## 2012-11-10 DIAGNOSIS — Z0000 Encounter for general adult medical examination without abnormal findings: Secondary | ICD-10-CM

## 2012-11-10 DIAGNOSIS — R399 Unspecified symptoms and signs involving the genitourinary system: Secondary | ICD-10-CM

## 2012-11-10 DIAGNOSIS — Z01419 Encounter for gynecological examination (general) (routine) without abnormal findings: Secondary | ICD-10-CM | POA: Insufficient documentation

## 2012-11-10 LAB — CBC WITH DIFFERENTIAL/PLATELET
Basophils Absolute: 0 10*3/uL (ref 0.0–0.1)
Basophils Relative: 1 % (ref 0–1)
Eosinophils Absolute: 0.1 10*3/uL (ref 0.0–0.7)
Eosinophils Relative: 2 % (ref 0–5)
HCT: 37.5 % (ref 36.0–46.0)
Hemoglobin: 12.1 g/dL (ref 12.0–15.0)
Lymphocytes Relative: 43 % (ref 12–46)
Lymphs Abs: 1.9 10*3/uL (ref 0.7–4.0)
MCH: 25.7 pg — ABNORMAL LOW (ref 26.0–34.0)
MCHC: 32.3 g/dL (ref 30.0–36.0)
MCV: 79.8 fL (ref 78.0–100.0)
Monocytes Absolute: 0.4 10*3/uL (ref 0.1–1.0)
Monocytes Relative: 8 % (ref 3–12)
Neutro Abs: 2.1 10*3/uL (ref 1.7–7.7)
Neutrophils Relative %: 46 % (ref 43–77)
Platelets: 248 10*3/uL (ref 150–400)
RBC: 4.7 MIL/uL (ref 3.87–5.11)
RDW: 15.1 % (ref 11.5–15.5)
WBC: 4.5 10*3/uL (ref 4.0–10.5)

## 2012-11-10 LAB — POCT WET PREP (WET MOUNT): Clue Cells Wet Prep Whiff POC: NEGATIVE

## 2012-11-10 LAB — POCT UA - MICROSCOPIC ONLY

## 2012-11-10 LAB — POCT URINALYSIS DIPSTICK
Bilirubin, UA: NEGATIVE
Glucose, UA: NEGATIVE
Ketones, UA: NEGATIVE
Leukocytes, UA: NEGATIVE
Nitrite, UA: NEGATIVE
Protein, UA: NEGATIVE
Spec Grav, UA: >=1.03
Urobilinogen, UA: 0.2
pH, UA: 5.5

## 2012-11-10 LAB — RPR

## 2012-11-10 LAB — HIV ANTIBODY (ROUTINE TESTING W REFLEX): HIV: NONREACTIVE

## 2012-11-10 LAB — TSH: TSH: 1.26 u[IU]/mL (ref 0.350–4.500)

## 2012-11-10 NOTE — Progress Notes (Signed)
Subjective:     Patient ID: Alyssa Montgomery, female   DOB: [birth year], 51 y.o.   MRN: [id number]  HPI Well women exam: Patient present for her well women exam. She has complaints of left lower abdominal pain. Her last LMP was 2010, however she experienced light pink fluid for 1 day last month that was on her toilet paper, but she feels was vaginal secretions. She has experienced dyspareunia over the last month that has been worse than prior and she attributes to her fibroid. She complains of increased fatigue, decrease in mental clarity and concern for STD. She reports she is married, but "men will be men." She has had a right oophorectomy and an extensive evaluation for chronic abdominal pain caused by cyst and fibroids in the past, last being 2012. Oophorectomy 2/2 ectopic pregnancy. She has questions about hysterectomy and bladder sling procedures and is considering have both completed.   Review of Systems Per above HPI    Objective:   Physical Exam BP 143/82  Pulse 75  Temp(Src) 98.5 F (36.9 C) (Oral)  Ht 5\' 4"  (1.626 m)  Wt 219 lb (99.338 kg)  BMI 37.57 kg/m2 Gen: well developed, NAD ABD: soft, tender LLQ. BS present.  GYN:  External genitalia within normal limits.  Vaginal mucosa pink, moist, normal rugae.  Non-friable stenotic cervix without lesions, no discharge or bleeding noted on speculum exam.  Bimanual exam revealed normal, nongravid uterus.  Positive cervical motion tenderness. No adnexal masses bilaterally. Cervical and vault scarring?, ? Mass, TTP. No suprapubic tenderness

## 2012-11-11 ENCOUNTER — Telehealth: Payer: Self-pay | Admitting: Family Medicine

## 2012-11-11 DIAGNOSIS — N949 Unspecified condition associated with female genital organs and menstrual cycle: Secondary | ICD-10-CM

## 2012-11-11 NOTE — Patient Instructions (Signed)
Pelvic Pain, Female °Female pelvic pain can be caused by many different things and start from a variety of places. Pelvic pain refers to pain that is located in the lower half of the abdomen and between your hips. The pain may occur over a short period of time (acute) or may be reoccurring (chronic). The cause of pelvic pain may be related to disorders affecting the female reproductive organs (gynecologic), but it may also be related to the bladder, kidney stones, an intestinal complication, or muscle or skeletal problems. Getting help right away for pelvic pain is important, especially if there has been severe, sharp, or a sudden onset of unusual pain. It is also important to get help right away because some types of pelvic pain can be life threatening.  °CAUSES  °Below are only some of the causes of pelvic pain. The causes of pelvic pain can be in one of several categories.  °· Gynecologic. °· Pelvic inflammatory disease. °· Sexually transmitted infection. °· Ovarian cyst or a twisted ovarian ligament (ovarian torsion). °· Uterine lining that grows outside the uterus (endometriosis). °· Fibroids, cysts, or tumors. °· Ovulation. °· Pregnancy. °· Pregnancy that occurs outside the uterus (ectopic pregnancy). °· Miscarriage. °· Labor. °· Abruption of the placenta or ruptured uterus. °· Infection. °· Uterine infection (endometritis). °· Bladder infection. °· Diverticulitis. °· Miscarriage related to a uterine infection (septic abortion). °· Bladder. °· Inflammation of the bladder (cystitis). °· Kidney stone(s). °· Gastrointenstinal. °· Constipation. °· Diverticulitis. °· Neurologic. °· Trauma. °· Feeling pelvic pain because of mental or emotional causes (psychosomatic). °· Cancers of the bowel or pelvis. °EVALUATION  °Your caregiver will want to take a careful history of your concerns. This includes recent changes in your health, a careful gynecologic history of your periods (menses), and a sexual history. Obtaining  your family history and medical history is also important. Your caregiver may suggest a pelvic exam. A pelvic exam will help identify the location and severity of the pain. It also helps in the evaluation of which organ system may be involved. In order to identify the cause of the pelvic pain and be properly treated, your caregiver may order tests. These tests may include:  °· A pregnancy test. °· Pelvic ultrasonography. °· An X-ray exam of the abdomen. °· A urinalysis or evaluation of vaginal discharge. °· Blood tests. °HOME CARE INSTRUCTIONS  °· Only take over-the-counter or prescription medicines for pain, discomfort, or fever as directed by your caregiver.   °· Rest as directed by your caregiver.   °· Eat a balanced diet.   °· Drink enough fluids to make your urine clear or pale yellow, or as directed.   °· Avoid sexual intercourse if it causes pain.   °· Apply warm or cold compresses to the lower abdomen depending on which one helps the pain.   °· Avoid stressful situations.   °· Keep a journal of your pelvic pain. Write down when it started, where the pain is located, and if there are things that seem to be associated with the pain, such as food or your menstrual cycle. °· Follow up with your caregiver as directed.   °SEEK MEDICAL CARE IF: °· Your medicine does not help your pain. °· You have abnormal vaginal discharge. °SEEK IMMEDIATE MEDICAL CARE IF:  °· You have heavy bleeding from the vagina.   °· Your pelvic pain increases.   °· You feel lightheaded or faint.   °· You have chills.   °· You have pain with urination or blood in your urine.   °· You have uncontrolled   diarrhea or vomiting.   °· You have a fever or persistent symptoms for more than 3 days. °· You have a fever and your symptoms suddenly get worse.   °· You are being physically or sexually abused.   °MAKE SURE YOU: °· Understand these instructions. °· Will watch your condition. °· Will get help if you are not doing well or get worse. °Document  Released: 04/24/2004 Document Revised: 11/27/2011 Document Reviewed: 09/17/2011 °[employer]® Patient Information ©2014 [employer]. ° °

## 2012-11-11 NOTE — Assessment & Plan Note (Addendum)
-   Patient with well women exam today. History of Right oophorectomy, stenotic cervix, fibroids, cyst and chronic pelvic pain - Unlikely a satisfactory PAP sample d/t stenosis of cervix. Will await results. - LLQ pain and vaginal vault tenderness, with abnormal bimanual exam. --> last [place] 2012, will obtain transvaginal US. Her exam was concerning for either scarring or mass.  - HIV, RPR are negative today; UA obtained - Awaiting G/C cultures and PAP results

## 2012-11-11 NOTE — Telephone Encounter (Signed)
Please call patient and inform her, her urine was negative for infection. Her HIV, RPR, TSH and CBC (anemia) are are all normal. I am still awaiting her PAP results and G/C culture. I would like her to have an [place] (transvaginal) completed to investigate her pain, considering her extensive history. I have placed the order for it to be completed at [facility], it will need set up. Thanks.

## 2012-11-11 NOTE — Telephone Encounter (Signed)
Left message on voicemail for a rtn call. Alyssa Montgomery, Alyssa Montgomery

## 2012-11-11 NOTE — Addendum Note (Signed)
Addended by: Damita Lack on: 11/11/2012 03:55 PM   Modules accepted: Orders

## 2012-11-12 ENCOUNTER — Encounter: Payer: Self-pay | Admitting: Family Medicine

## 2012-11-17 ENCOUNTER — Ambulatory Visit (HOSPITAL_COMMUNITY)
Admission: RE | Admit: 2012-11-17 | Discharge: 2012-11-17 | Disposition: A | Discharge status: Home / Self Care | Payer: Medicare Other | Source: Ambulatory Visit | Attending: Family Medicine | Admitting: Family Medicine

## 2012-11-17 DIAGNOSIS — IMO0002 Reserved for concepts with insufficient information to code with codable children: Secondary | ICD-10-CM | POA: Insufficient documentation

## 2012-11-17 DIAGNOSIS — R102 Pelvic and perineal pain: Secondary | ICD-10-CM

## 2012-11-17 DIAGNOSIS — Z780 Asymptomatic menopausal state: Secondary | ICD-10-CM | POA: Insufficient documentation

## 2012-11-17 DIAGNOSIS — D259 Leiomyoma of uterus, unspecified: Secondary | ICD-10-CM | POA: Insufficient documentation

## 2012-11-17 DIAGNOSIS — N949 Unspecified condition associated with female genital organs and menstrual cycle: Secondary | ICD-10-CM | POA: Insufficient documentation

## 2012-11-17 DIAGNOSIS — G8929 Other chronic pain: Secondary | ICD-10-CM

## 2012-11-18 ENCOUNTER — Telehealth: Payer: Self-pay | Admitting: Family Medicine

## 2012-11-18 DIAGNOSIS — R9389 Abnormal findings on diagnostic imaging of other specified body structures: Secondary | ICD-10-CM | POA: Insufficient documentation

## 2012-11-18 DIAGNOSIS — N8500 Endometrial hyperplasia, unspecified: Secondary | ICD-10-CM

## 2012-11-18 NOTE — Assessment & Plan Note (Signed)
-   Per  [place]orea report, endometrial thickness measures 16 mm. Endometrial thickness is considered abnormal for an asymptomatic (non-bleeding) post- menopausal female. Endometrial sampling should be considered to exclude carcinoma. - I am referring her to [facility] for this procedure. I do not believe with her stenotic cervix I would be able to obtain a good biopsy sample. - I will place referral today.

## 2012-11-18 NOTE — Telephone Encounter (Signed)
Please inform patient her [place] resulted and it showed endometrial thickness, abnormal for post-menopausal female. I am placing a referral to [facility] hospital for her to be evaluated for endometrial hyperplasia and possibly get an Endometrial biopsy procedure.  I do not believe with her stenotic cervix I would be able to obtain a good biopsy sample. They may wish to repeat her PAP as well, d/t to her cervical stenosis I was not able to get a great sample.  - I will place referral today and she should be hearing from them soon for an appointment.

## 2012-11-19 ENCOUNTER — Encounter: Payer: Self-pay | Admitting: Obstetrics & Gynecology

## 2012-11-19 NOTE — Telephone Encounter (Signed)
Called patient and left message to call our office back.  Clovis Mankins Ann, RN  

## 2012-11-24 ENCOUNTER — Ambulatory Visit: Payer: Medicare Other | Admitting: Family Medicine

## 2012-12-08 ENCOUNTER — Ambulatory Visit (INDEPENDENT_AMBULATORY_CARE_PROVIDER_SITE_OTHER): Payer: Medicare Other | Admitting: Family Medicine

## 2012-12-08 VITALS — BP 132/74 | HR 70 | Temp 98.8°F | Ht 64.0 in | Wt 219.0 lb

## 2012-12-08 DIAGNOSIS — Z720 Tobacco use: Secondary | ICD-10-CM

## 2012-12-08 DIAGNOSIS — R141 Gas pain: Secondary | ICD-10-CM

## 2012-12-08 DIAGNOSIS — R9389 Abnormal findings on diagnostic imaging of other specified body structures: Secondary | ICD-10-CM

## 2012-12-08 DIAGNOSIS — M549 Dorsalgia, unspecified: Secondary | ICD-10-CM

## 2012-12-08 DIAGNOSIS — R062 Wheezing: Secondary | ICD-10-CM | POA: Insufficient documentation

## 2012-12-08 DIAGNOSIS — F17200 Nicotine dependence, unspecified, uncomplicated: Secondary | ICD-10-CM

## 2012-12-08 DIAGNOSIS — J069 Acute upper respiratory infection, unspecified: Secondary | ICD-10-CM | POA: Insufficient documentation

## 2012-12-08 DIAGNOSIS — R143 Flatulence: Secondary | ICD-10-CM

## 2012-12-08 MED ORDER — ALBUTEROL SULFATE HFA 108 (90 BASE) MCG/ACT IN AERS: 2.0000 | INHALATION_SPRAY | Freq: Four times a day (QID) | RESPIRATORY_TRACT | Status: DC | PRN

## 2012-12-08 MED ORDER — AZITHROMYCIN 250 MG PO TABS: ORAL_TABLET | ORAL | Status: DC

## 2012-12-08 MED ORDER — OXYCODONE-ACETAMINOPHEN 5-325 MG PO TABS: 1.0000 | ORAL_TABLET | Freq: Two times a day (BID) | ORAL | Status: DC

## 2012-12-08 NOTE — Assessment & Plan Note (Signed)
-   refilled albuterol inhaler today.  - Z-pack for URI

## 2012-12-08 NOTE — Assessment & Plan Note (Addendum)
-   Treated URI with z-pack taper - Instructed patient to use albuterol inhaler 4x daily (2-4 puffs) until URI symptoms resolved or 1 week.  - albuterol refills today - AVS on URI - F/U as needed

## 2012-12-08 NOTE — Assessment & Plan Note (Addendum)
-   Patient complains of chronic conditions of neck, shoulder and back pain that has been followed up at [facility] ortho. - Prescribed 1 month supply of Percocet today, with conditions that if she continues to need medication for pain she will need to return for further evaluations and possible injections with ortho. Patient is in full understanding.  - Suggested bowel regimen, while on perc - F/u: PRN

## 2012-12-08 NOTE — Progress Notes (Signed)
Subjective:     Patient ID: Alyssa Montgomery, female   DOB: February 10, 1961, 52 y.o.   MRN: [id number]  HPI Congestion: Patient complains of nasal congestion, headache and chills since last week. Her appetite has declined. She denies nausea, vomit or diarrhea. She reports she feels short of breath and using her albuterol inhaler 4 puffs x4 daily. She can hear herself wheeze. She feels like she sometimes can not catch her breath. She denies sore throat, itchy eyes or  Ear discomfort, however she does admit to feeling like she is "under water" because her ears seem muffled. She is not aware of any sick contacts.   Abdominal Pain/bloating: She believes her pain is still consistent with her last exam. We discussed her [place] and lab results extensively and the importance of her referral to GYN on the 14th of July.  She is fully aware of the complication and her endometrial thickness and the variable things it could imply. She endorses she will attend her appointment at [facility] hospital.    Back/neck pain: She complains her lower back pain again has started bothering her. She also complains of neck pian. She reports this pain is like it has been prior that she was seen at [facility] orthopedics and was receiving injections. Patient has an abuse history with cocaine. She has had an extensive work up prior with orthopedics. She reports tramadol does not help her and Vicodin makes her nauseated. She reports she is not sleeping well at night and manly feels she needs something to help relieve the pain at night, so she may get rest.  Review of Systems See above HPI    Objective:   Physical Exam BP 132/74  Pulse 70  Temp(Src) 98.8 F (37.1 C) (Oral)  Ht 5\' 4"  (1.626 m)  Wt 219 lb (99.338 kg)  BMI 37.57 kg/m2  SpO2 96% Gen: Looks tired and congested.  CV: RRR Lungs: Bilateral bibasilar wheezing noted. No crackles or rhonchi.  Abd: Soft. Mildly tender LLQ. Mildly distended, Ext; No edema or erythema

## 2012-12-08 NOTE — Patient Instructions (Signed)
I will call you in an antibiotic for your URI. Please take this to completion.  I will also call you in a one time prescription for percocet. This is for acute pain only and will not be chronic medication without further workup by ortho.  Please make sure to follow up with [facility] on July 14th.   Upper Respiratory Infection, Adult An upper respiratory infection (URI) is also known as the common cold. It is often caused by a type of germ (virus). Colds are easily spread (contagious). You can pass it to others by kissing, coughing, sneezing, or drinking out of the same glass. Usually, you get better in 1 or 2 weeks.  HOME CARE   Only take medicine as told by your doctor.  Use a warm mist humidifier or breathe in steam from a hot shower.  Drink enough water and fluids to keep your pee (urine) clear or pale yellow.  Get plenty of rest.  Return to work when your temperature is back to normal or as told by your doctor. You may use a face mask and wash your hands to stop your cold from spreading. GET HELP RIGHT AWAY IF:   After the first few days, you feel you are getting worse.  You have questions about your medicine.  You have chills, shortness of breath, or brown or red spit (mucus).  You have yellow or brown snot (nasal discharge) or pain in the face, especially when you bend forward.  You have a fever, puffy (swollen) neck, pain when you swallow, or white spots in the back of your throat.  You have a bad headache, ear pain, sinus pain, or chest pain.  You have a high-pitched whistling sound when you breathe in and out (wheezing).  You have a lasting cough or cough up blood.  You have sore muscles or a stiff neck. MAKE SURE YOU:   Understand these instructions.  Will watch your condition.  Will get help right away if you are not doing well or get worse. Document Released: 11/14/2007 Document Revised: 08/20/2011 Document Reviewed: 10/02/2010 [facility] Patient  Information 2014 [place], [place].

## 2012-12-08 NOTE — Assessment & Plan Note (Addendum)
-   reviewed labs and images with patient. Discussed endometrial thickness and abnormality. Stressed importance of gyn appointment that is scheduled on the 14th of July at [facility]. Patient is aware and reports she will attend appointment.  - Asked patient to please f/u after [facility]

## 2012-12-22 ENCOUNTER — Other Ambulatory Visit (HOSPITAL_COMMUNITY)
Admission: RE | Admit: 2012-12-22 | Discharge: 2012-12-22 | Disposition: A | Discharge status: Home / Self Care | Payer: Medicare Other | Source: Ambulatory Visit | Attending: Obstetrics & Gynecology | Admitting: Obstetrics & Gynecology

## 2012-12-22 ENCOUNTER — Ambulatory Visit (INDEPENDENT_AMBULATORY_CARE_PROVIDER_SITE_OTHER): Payer: Medicare Other | Admitting: Obstetrics & Gynecology

## 2012-12-22 ENCOUNTER — Encounter: Payer: Self-pay | Admitting: Obstetrics & Gynecology

## 2012-12-22 VITALS — BP 119/76 | HR 75 | Temp 98.0°F | Ht 64.0 in | Wt 220.3 lb

## 2012-12-22 DIAGNOSIS — N949 Unspecified condition associated with female genital organs and menstrual cycle: Secondary | ICD-10-CM

## 2012-12-22 DIAGNOSIS — R9389 Abnormal findings on diagnostic imaging of other specified body structures: Secondary | ICD-10-CM | POA: Insufficient documentation

## 2012-12-22 DIAGNOSIS — R102 Pelvic and perineal pain: Secondary | ICD-10-CM

## 2012-12-22 DIAGNOSIS — N950 Postmenopausal bleeding: Secondary | ICD-10-CM | POA: Insufficient documentation

## 2012-12-22 DIAGNOSIS — N882 Stricture and stenosis of cervix uteri: Secondary | ICD-10-CM | POA: Insufficient documentation

## 2012-12-22 DIAGNOSIS — G8929 Other chronic pain: Secondary | ICD-10-CM

## 2012-12-22 LAB — POCT PREGNANCY, URINE: Preg Test, Ur: NEGATIVE

## 2012-12-22 NOTE — Progress Notes (Signed)
GYNECOLOGY CLINIC PROGRESS NOTE  History:  52 y.o. [id number] here today for endometrial biopsy in the setting of postmenopausal bleeding and thickened endometrium on ultrasound.  Patient is tired of this, desires a hysterectomy.  Last endometrial biopsy in 12/2011 was benign,   Pap in 11/2012 was negative.  Patient has cervical stenosis which makes it hard to do pap smears or endometrial biopsies.  She reports ongoing chronic pelvic pain. Bleeding consists of isolated episodes of spotting, no heavy bleeding.  The following portions of the patient's history were reviewed and updated as appropriate: allergies, current medications, past family history, past medical history, past social history, past surgical history and problem list.  Review of Systems:  Pertinent items are noted in HPI.  Objective:  Physical Exam BP 119/76  Pulse 75  Temp(Src) 98 F (36.7 C)  Ht 5\' 4"  (1.626 m)  Wt 220 lb 4.8 oz (99.927 kg)  BMI 37.8 kg/m2 Gen: NAD Abd: Soft, mild lower abdominal tenderness on exam   ENDOMETRIAL BIOPSY     The indications for endometrial biopsy were reviewed.   Risks of the biopsy including cramping, bleeding, infection, uterine perforation, inadequate specimen and need for additional procedures  were discussed. The patient states she understands and agrees to undergo procedure today. Consent was signed. Time out was performed. Urine HCG was negative. A sterile speculum was placed in the patient's vagina and the cervix was prepped with Betadine. A single-toothed tenaculum was placed on the anterior lip of the cervix to stabilize it. Cervical stenosis encountered, plastic dilators used to breach this stenosis.  The 3 mm pipelle was introduced into the endometrial cavity to a depth of 9 cm, and a small amount of tissue was obtained even after three attempts.  The tissue was sent to pathology. The instruments were removed from the patient's vagina. Minimal bleeding from the cervix was noted. The  patient tolerated the procedure well. Routine post-procedure instructions were given to the patient.   Assessment & Plan:   The patient will be called with the results and given recommendations for further management.   She was told that a hysterectomy will take care of the bleeding problem but may not solve her chronic pelvic pain; may be a good candidate for RATH Will follow up results and manage accordingly Bleeding and pain precautions reviewed

## 2012-12-22 NOTE — Patient Instructions (Signed)
Return to clinic for any scheduled appointments or for any gynecologic concerns as needed.   

## 2012-12-24 ENCOUNTER — Telehealth: Payer: Self-pay

## 2012-12-24 NOTE — Telephone Encounter (Signed)
Called pt and informed her that her endo bx is benign no signs of cancer and to please and schedule an appt with Dr. Erin Fulling to discuss surgery.  Pt stated that she would call and did not have any other questions.

## 2012-12-24 NOTE — Telephone Encounter (Signed)
Message copied by Faythe Casa on Wed Dec 24, 2012  4:44 PM ------      Message from: Jaynie Collins A      Created: Wed Dec 24, 2012  3:21 PM       Benign endometrial biopsy. Please call to inform patient of results and schedule follow up appointment with Dr. Erin Fulling to discuss robot assisted hysterectomy ------

## 2013-01-06 ENCOUNTER — Telehealth: Payer: Self-pay | Admitting: General Practice

## 2013-01-06 NOTE — Telephone Encounter (Signed)
Called patient, no answer- left message that we are trying to get in touch with her with some information and an appt and to call us back at the clinics

## 2013-01-06 NOTE — Telephone Encounter (Signed)
Message copied by Kathee Delton on Tue Jan 06, 2013  1:54 PM ------      Message from: Odelia Gage A      Created: Tue Jan 06, 2013  7:27 AM         09/04 @01 :15            ----- Message -----         From: Tereso Newcomer, MD         Sent: 12/24/2012   3:21 PM           To: [facility], [facility] Admin [facility]            Benign endometrial biopsy. Please call to inform patient of results and schedule follow up appointment with Dr. Erin Fulling to discuss robot assisted hysterectomy       ------

## 2013-01-08 NOTE — Telephone Encounter (Signed)
Called pt and informed her of Dr. Mont Dutton message : benign endometrial bx and need for appt w/Dr. Erin Fulling to discuss possible robot assisted hysterectomy.  Pt voiced understanding. I stated that someone from our office will call her with follow up appt. Pt agreed.

## 2013-01-09 ENCOUNTER — Encounter: Payer: Self-pay | Admitting: *Deleted

## 2013-02-12 ENCOUNTER — Encounter: Payer: Medicare Other | Admitting: Obstetrics & Gynecology

## 2013-02-27 ENCOUNTER — Encounter: Payer: Self-pay | Admitting: Family Medicine

## 2013-02-27 ENCOUNTER — Ambulatory Visit (INDEPENDENT_AMBULATORY_CARE_PROVIDER_SITE_OTHER): Payer: Medicare Other | Admitting: Family Medicine

## 2013-02-27 VITALS — BP 135/92 | HR 71 | Temp 97.7°F | Ht 64.0 in | Wt 220.0 lb

## 2013-02-27 DIAGNOSIS — N898 Other specified noninflammatory disorders of vagina: Secondary | ICD-10-CM

## 2013-02-27 DIAGNOSIS — J069 Acute upper respiratory infection, unspecified: Secondary | ICD-10-CM

## 2013-02-27 DIAGNOSIS — E049 Nontoxic goiter, unspecified: Secondary | ICD-10-CM

## 2013-02-27 DIAGNOSIS — R109 Unspecified abdominal pain: Secondary | ICD-10-CM

## 2013-02-27 DIAGNOSIS — N949 Unspecified condition associated with female genital organs and menstrual cycle: Secondary | ICD-10-CM

## 2013-02-27 DIAGNOSIS — M545 Low back pain, unspecified: Secondary | ICD-10-CM

## 2013-02-27 LAB — COMPLETE METABOLIC PANEL WITH GFR
ALT: 20 U/L (ref 0–35)
AST: 17 U/L (ref 0–37)
Albumin: 4.1 g/dL (ref 3.5–5.2)
Alkaline Phosphatase: 101 U/L (ref 39–117)
BUN: 14 mg/dL (ref 6–23)
CO2: 27 mEq/L (ref 19–32)
Calcium: 9.5 mg/dL (ref 8.4–10.5)
Chloride: 104 mEq/L (ref 96–112)
Creat: 0.76 mg/dL (ref 0.50–1.10)
GFR, Est African American: >89 mL/min
GFR, Est Non African American: >89 mL/min
Glucose, Bld: 95 mg/dL (ref 70–99)
Potassium: 4.3 mEq/L (ref 3.5–5.3)
Sodium: 138 mEq/L (ref 135–145)
Total Bilirubin: 0.3 mg/dL (ref 0.3–1.2)
Total Protein: 7.1 g/dL (ref 6.0–8.3)

## 2013-02-27 LAB — POCT URINALYSIS DIPSTICK
Bilirubin, UA: NEGATIVE
Glucose, UA: NEGATIVE
Ketones, UA: NEGATIVE
Leukocytes, UA: NEGATIVE
Nitrite, UA: NEGATIVE
Protein, UA: NEGATIVE
Spec Grav, UA: 1.025
Urobilinogen, UA: 0.2
pH, UA: 5.5

## 2013-02-27 LAB — POCT WET PREP (WET MOUNT): Clue Cells Wet Prep Whiff POC: NEGATIVE

## 2013-02-27 LAB — POCT UA - MICROSCOPIC ONLY

## 2013-02-27 LAB — CBC
HCT: 40.1 % (ref 36.0–46.0)
Hemoglobin: 13.1 g/dL (ref 12.0–15.0)
MCH: 26 pg (ref 26.0–34.0)
MCHC: 32.7 g/dL (ref 30.0–36.0)
MCV: 79.7 fL (ref 78.0–100.0)
Platelets: 275 10*3/uL (ref 150–400)
RBC: 5.03 MIL/uL (ref 3.87–5.11)
RDW: 15.6 % — ABNORMAL HIGH (ref 11.5–15.5)
WBC: 5.2 10*3/uL (ref 4.0–10.5)

## 2013-02-27 LAB — LIPASE: Lipase: 12 U/L (ref 0–75)

## 2013-02-27 MED ORDER — CEPHALEXIN 500 MG PO CAPS: 500.0000 mg | ORAL_CAPSULE | Freq: Three times a day (TID) | ORAL | Status: DC

## 2013-02-27 NOTE — Patient Instructions (Addendum)
It was nice seeing you today.  In regards to your abdominal pain, I have prescribed an antibiotic for ? UTI.  Please take as prescribed. Be sure to follow up with Ob-Gyn regarding your pelvic pain.  I am also getting some labs today.   In regards to your sore throat, I feel that you have a viral illness that will get better on its own.  However, I did feel an enlarged thyroid on exam.  I am sending you for Ultrasound (Imaging center on [address]).  In regards to your vaginal odor, there was nothing on your Wet prep (swab).  Please follow up with ob-gyn  Please follow up with Dr. Claiborne Billings in 1 week.

## 2013-02-28 DIAGNOSIS — J069 Acute upper respiratory infection, unspecified: Secondary | ICD-10-CM

## 2013-02-28 DIAGNOSIS — E049 Nontoxic goiter, unspecified: Secondary | ICD-10-CM

## 2013-02-28 DIAGNOSIS — R109 Unspecified abdominal pain: Secondary | ICD-10-CM | POA: Insufficient documentation

## 2013-02-28 DIAGNOSIS — N898 Other specified noninflammatory disorders of vagina: Secondary | ICD-10-CM | POA: Insufficient documentation

## 2013-02-28 HISTORY — DX: Acute upper respiratory infection, unspecified: J06.9

## 2013-02-28 HISTORY — DX: Nontoxic goiter, unspecified: E04.9

## 2013-02-28 NOTE — Assessment & Plan Note (Signed)
Unclear etiology.  Given suprapubic discomfort and urinary frequency will treat empirically for UTI with Keflex. Additionally, I will obtain CBC, CMP, and Lipase to assess for underlying pathology.

## 2013-02-28 NOTE — Assessment & Plan Note (Signed)
Patient reporting symptoms consistent with URI. Physical exam in this regard was unremarkable.   Patient reassured.  Advised supportive care.

## 2013-02-28 NOTE — Progress Notes (Signed)
Subjective:     Patient ID: Alyssa Montgomery, female   DOB: 1961/01/12, 52 y.o.   MRN: [id number]  HPI 52 year old female with history of postmenopausal bleeding and chronic pelvic pain presents to the clinic today with several complaints.  1) Abdominal pain - Patient reports a several day history of lower abdominal pain with associated nausea, diarrhea, and back discomfort. - Pain moderate in severity. - She also reports urinary frequency.  She denies fevers, chills.   - No relieving factors noted. - She reports that this is different that her chronic abdominal/pelvic pain.  2) Cough, sensation of mucous in the throat. - This has been going on for 2 days. - She reports that she feels a "knot" in her throat - No associated fever - No exacerbating or relieving factors.  3) Vaginal odor - Patient reports vaginal odor for the past few days. - She is unable to describe the odor but states that she feels that she may have a Yeast infection - Odor has persisted despite frequent bathing.  History   Social History  . Marital Status: Married    Spouse Name: N/A    Number of Children: N/A  . Years of Education: N/A   Social History Main Topics  . Smoking status: Current Every Day Smoker -- 1.00 packs/day for 18 years    Types: Cigarettes  . Smokeless tobacco: Never Used     Comment: 1-800 number given   . Alcohol Use: No  . 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 Sexual Activity: Yes    Partners: Male    Birth Control/ Protection: Post-menopausal   Other Topics Concern  . None   Social History Narrative  . None    Review of Systems Per HPI    Objective:   Physical Exam Exam: General: well appearing, NAD. HEENT: NCAT. No pharyngeal erythema or tonsillar exudate noted.  Neck: Thyroid enlarged.  Symmetrical, no nodules palpated.  Cardiovascular: RRR. No murmurs, rubs, or gallops. Respiratory: CTAB. No rales, rhonchi, or wheeze. Abdomen: soft, tender to  palpation in the suprapubic region.  No rebound or guarding.   Pelvic Exam:        External: normal female genitalia without lesions or masses        Vagina: normal without lesions or masses, minimal discharge noted        Cervix: normal appearing, no masses or lesions noted        Samples for Wet prep obtained    Assessment:     See Problem List    Plan:

## 2013-02-28 NOTE — Assessment & Plan Note (Signed)
Normal TSH in June.  Given enlarged thyroid, will send patient for [place].

## 2013-02-28 NOTE — Assessment & Plan Note (Addendum)
Wet prep unremarkable today.   Unclear etiology.

## 2013-03-02 ENCOUNTER — Encounter: Payer: Self-pay | Admitting: Family Medicine

## 2013-03-03 ENCOUNTER — Ambulatory Visit (HOSPITAL_COMMUNITY)
Admission: RE | Admit: 2013-03-03 | Discharge: 2013-03-03 | Disposition: A | Discharge status: Home / Self Care | Payer: Medicare Other | Source: Ambulatory Visit | Attending: Family Medicine | Admitting: Family Medicine

## 2013-03-03 ENCOUNTER — Encounter: Payer: Self-pay | Admitting: Family Medicine

## 2013-03-03 DIAGNOSIS — E041 Nontoxic single thyroid nodule: Secondary | ICD-10-CM | POA: Insufficient documentation

## 2013-03-03 DIAGNOSIS — E049 Nontoxic goiter, unspecified: Secondary | ICD-10-CM

## 2013-04-17 ENCOUNTER — Ambulatory Visit (INDEPENDENT_AMBULATORY_CARE_PROVIDER_SITE_OTHER): Payer: Medicare Other | Admitting: *Deleted

## 2013-04-17 DIAGNOSIS — Z111 Encounter for screening for respiratory tuberculosis: Secondary | ICD-10-CM

## 2013-04-17 DIAGNOSIS — Z23 Encounter for immunization: Secondary | ICD-10-CM

## 2013-04-17 NOTE — Progress Notes (Signed)
Patient here today to receive PPD and flu vaccine.  Needs PPD for work.  PPD given and patient will return on Monday within 48-72 hours to have site read.  Gaylene Brooks, RN

## 2013-04-20 ENCOUNTER — Ambulatory Visit (INDEPENDENT_AMBULATORY_CARE_PROVIDER_SITE_OTHER): Payer: Medicare Other | Admitting: *Deleted

## 2013-04-20 ENCOUNTER — Encounter: Payer: Self-pay | Admitting: *Deleted

## 2013-04-20 ENCOUNTER — Encounter: Payer: Self-pay | Admitting: Family Medicine

## 2013-04-20 DIAGNOSIS — Z111 Encounter for screening for respiratory tuberculosis: Secondary | ICD-10-CM

## 2013-04-20 LAB — TB SKIN TEST
Induration: 0 mm
TB Skin Test: NEGATIVE

## 2013-04-30 ENCOUNTER — Ambulatory Visit: Payer: Medicare Other | Admitting: Family Medicine

## 2013-05-01 ENCOUNTER — Ambulatory Visit (INDEPENDENT_AMBULATORY_CARE_PROVIDER_SITE_OTHER): Payer: Medicare Other | Admitting: Family Medicine

## 2013-05-01 ENCOUNTER — Encounter: Payer: Self-pay | Admitting: Family Medicine

## 2013-05-01 VITALS — BP 120/81 | HR 81 | Temp 97.9°F | Ht 64.0 in | Wt 222.1 lb

## 2013-05-01 DIAGNOSIS — M542 Cervicalgia: Secondary | ICD-10-CM

## 2013-05-01 DIAGNOSIS — R319 Hematuria, unspecified: Secondary | ICD-10-CM

## 2013-05-01 LAB — POCT URINALYSIS DIPSTICK
Bilirubin, UA: NEGATIVE
Glucose, UA: NEGATIVE
Ketones, UA: NEGATIVE
Leukocytes, UA: NEGATIVE
Nitrite, UA: NEGATIVE
Protein, UA: NEGATIVE
Spec Grav, UA: >=1.03
Urobilinogen, UA: 0.2
pH, UA: 5.5

## 2013-05-01 LAB — POCT UA - MICROSCOPIC ONLY

## 2013-05-01 MED ORDER — DICLOFENAC SODIUM 1 % TD GEL: 2.0000 g | Freq: Two times a day (BID) | TRANSDERMAL | Status: DC | PRN

## 2013-05-01 MED ORDER — CYCLOBENZAPRINE HCL 5 MG PO TABS: 5.0000 mg | ORAL_TABLET | Freq: Three times a day (TID) | ORAL | Status: DC | PRN

## 2013-05-01 NOTE — Assessment & Plan Note (Signed)
Pain clinically corresponding with trigger point. Symptom relief after deep massage in office today.  Recommended deep massage at home, muscle relaxants and topical anti-inflammatory. Discussed the nature of condition and expectations regarding options for treatment. Discussed also signs of worsening condition that should prompt re-evaluation. Xray of neck ordered since pt has limited ROM due to pain to r/o other causes.

## 2013-05-01 NOTE — Patient Instructions (Signed)
I have placed a referral for you to get an x-ray of your neck the ultrasound of your kidneys.  For your x-ray I will call you if it come back abnormal otherwise you will receive a letter You need to make a followup appointment with your primary doctor to review ultrasound results and complete workup of your hematuria.

## 2013-05-01 NOTE — Assessment & Plan Note (Addendum)
Still present since 11/2012 and after abx treatment for UTI. No UTI symptoms. Kidney stone is in differential and U/S has been ordered. Urology evaluation is needed to r/u bladder causes.

## 2013-05-01 NOTE — Progress Notes (Signed)
Family Medicine Office Visit Note   Subjective:   Patient ID: Alyssa Montgomery, female  DOB: Nov 19, 1960, 52 y.o.. MRN: [id number]   Pt that comes today for same day appointment complaining of neck and shoulder pain. She reports having this is a chronic issue but worsened in the past couple weeks. Pain is located in the base of her neck and does not radiate. There is no numbness tingling sensation or weakness of her extremities.   Her other issue today is her hematuria: She is concerned about having bladder cancer, she denies UTI symptoms of dysuria, frequency or flank pain. She has chronic back pain but it is not different that she has been for years.   Review of Systems:  Per HPI  Objective:   Physical Exam: Gen:  NAD HEENT: Moist mucous membranes  CV: Regular rate and rhythm, no murmurs rubs or gallops PULM: Clear to auscultation bilaterally. No wheezes/rales/rhonchi ABD: Soft, non tender, non distended, normal bowel sounds. CVA no tenderness.  MSK;  Back - Normal skin, Spine with normal alignment and no deformity.  No tenderness to vertebral process palpation.  Paraspinous muscles are not tender and without spasm.  Range of motion is limited at neck due to pain. There is tenderness on the base of her neck on her left side consistent with trigger point. No signs of nerve impingement. Good pulses. Neuro: Alert and oriented x3. No focalization. Reflexes are intact as well as sensation. Strength is 5/5 in all 4 extremities.  Assessment & Plan:

## 2013-05-22 ENCOUNTER — Ambulatory Visit (HOSPITAL_COMMUNITY)
Admission: RE | Admit: 2013-05-22 | Discharge: 2013-05-22 | Disposition: A | Discharge status: Home / Self Care | Payer: Medicare Other | Source: Ambulatory Visit | Attending: Family Medicine | Admitting: Family Medicine

## 2013-05-22 DIAGNOSIS — R319 Hematuria, unspecified: Secondary | ICD-10-CM | POA: Insufficient documentation

## 2013-05-25 ENCOUNTER — Encounter: Payer: Self-pay | Admitting: Family Medicine

## 2013-05-25 ENCOUNTER — Other Ambulatory Visit: Payer: Self-pay | Admitting: Family Medicine

## 2013-05-25 DIAGNOSIS — R319 Hematuria, unspecified: Secondary | ICD-10-CM

## 2013-06-01 ENCOUNTER — Telehealth: Payer: Self-pay | Admitting: Family Medicine

## 2013-06-01 NOTE — Telephone Encounter (Signed)
Please advise. Alyssa Montgomery S  

## 2013-06-01 NOTE — Telephone Encounter (Signed)
Pt would like someone to call her with her ultra sound results. jw

## 2013-06-05 NOTE — Telephone Encounter (Signed)
Please call Alyssa Montgomery and inform her the kidney US was Normal. I did not order or evaluate her for this office visit and have no further information for her. Any further questions should be routed to Dr. Aviva Signs on this matter, as she was the ordering physician. Thanks.

## 2013-06-08 NOTE — Telephone Encounter (Signed)
Spoke with patient and informed her of results. She stated that she did receive a letter about this. She is having questions about her still bleeding during urination.

## 2013-06-15 ENCOUNTER — Other Ambulatory Visit: Payer: Self-pay | Admitting: Family Medicine

## 2013-06-15 DIAGNOSIS — M542 Cervicalgia: Secondary | ICD-10-CM

## 2013-06-15 MED ORDER — DICLOFENAC SODIUM 1 % TD GEL: 2.0000 g | Freq: Two times a day (BID) | TRANSDERMAL | Status: DC | PRN

## 2013-06-15 NOTE — Telephone Encounter (Signed)
Called pt and discussed results if ultrasound and the fact that letter sent was stating that she will need an UA to re-check for hematuria and if positive will refer to urology.  Pt also wanted to quit smoking and report that her shoulder still a problem since she never pick up Voltaren gel prescribed.  Prescription re-sent to pharmacy and instructed pt to schedule appt in order to address all this issues. Pt agreeable and appreciative of our call.

## 2013-06-19 ENCOUNTER — Ambulatory Visit (INDEPENDENT_AMBULATORY_CARE_PROVIDER_SITE_OTHER): Payer: Medicare HMO | Admitting: Family Medicine

## 2013-06-19 ENCOUNTER — Encounter: Payer: Self-pay | Admitting: Family Medicine

## 2013-06-19 VITALS — BP 117/71 | HR 72 | Temp 97.9°F | Wt 221.8 lb

## 2013-06-19 DIAGNOSIS — R319 Hematuria, unspecified: Secondary | ICD-10-CM

## 2013-06-19 LAB — POCT URINALYSIS DIPSTICK
Bilirubin, UA: NEGATIVE
Glucose, UA: NEGATIVE
Ketones, UA: NEGATIVE
Leukocytes, UA: NEGATIVE
Nitrite, UA: NEGATIVE
Protein, UA: NEGATIVE
Spec Grav, UA: 1.025
Urobilinogen, UA: 0.2
pH, UA: 5.5

## 2013-06-19 LAB — POCT UA - MICROSCOPIC ONLY

## 2013-06-19 LAB — POCT HEMOGLOBIN: Hemoglobin: 13.5 g/dL (ref 12.2–16.2)

## 2013-06-19 NOTE — Progress Notes (Signed)
Family Medicine Office Visit Note   Subjective:   Patient ID: Alyssa Montgomery, female  DOB: [birth year], 53 y.o.. MRN: [id number]   Pt that comes today to f/u hematuria. Her renal  U/S was reported as normal. She reports chronic mild discomfort in her lower abdomen/pelvis midline and has had GYN work up for this with no apparent cause of her symptoms. She denies burning with urination or frequency at this time but reports seen her urine sometimes pink. Pt denies fever, nausea vomiting or flank pain. She is a smoker and continues to smoke at this time. Denies  vaginal discharge.  Review of Systems:  Pt denies SOB, chest pain, palpitations, headaches, dizziness, numbness or weakness. No changes BM habits. No unintentional weigh loss/gain.  Objective:   Physical Exam: Gen:  NAD HEENT: Moist mucous membranes  CV: Regular rate and rhythm, no murmurs rubs or gallops PULM: Clear to auscultation bilaterally. No wheezes/rales/rhonchi. ABD: Soft, mildly tender in lower anterior abdomen, no guarding or rebound tenderness, non distended, normal bowel sounds. No CVA tenderness. GYN exam  Differed.  EXT: No edema Neuro: Alert and oriented x3. No focalization  Assessment & Plan:

## 2013-06-20 NOTE — Assessment & Plan Note (Signed)
UA still positive for microhematuria in a smoker pt with normal renal ultrasound. Urology referral placed. F/u with primary doctor.

## 2013-06-20 NOTE — Patient Instructions (Signed)
I have placed you referral for Urology. You will be contacted with date and time of appointmnet it is important that you don't miss it.  We strongly recommend to quit smoking. When you are ready we can help you. Follow up with you primary doctor as schedule or sooner if you develop concerning symptoms.

## 2013-07-16 ENCOUNTER — Encounter: Payer: Self-pay | Admitting: Family Medicine

## 2013-07-16 ENCOUNTER — Ambulatory Visit (INDEPENDENT_AMBULATORY_CARE_PROVIDER_SITE_OTHER): Payer: Medicare HMO | Admitting: Family Medicine

## 2013-07-16 VITALS — BP 104/68 | HR 95 | Temp 97.8°F | Wt 220.5 lb

## 2013-07-16 DIAGNOSIS — K529 Noninfective gastroenteritis and colitis, unspecified: Secondary | ICD-10-CM

## 2013-07-16 DIAGNOSIS — K5289 Other specified noninfective gastroenteritis and colitis: Secondary | ICD-10-CM

## 2013-07-16 MED ORDER — PROMETHAZINE HCL 25 MG PO TABS: 25.0000 mg | ORAL_TABLET | Freq: Three times a day (TID) | ORAL | Status: DC | PRN

## 2013-07-16 NOTE — Progress Notes (Signed)
Alyssa Montgomery is a 53 y.o. female who presents today for vomiting and diarrhea.  Pt c/o one day history of non bloody, non bilious vomiting that started this AM along with clear diarrhea ( non blood/non mucous).  She denies fever, chills, sweats, and states she has had flatulence.  Denies any recent raw meat, raw eggs, or eating at restaurant.  She has had two episodes of spontaneous SBO in the past secondary to previous abdominal surgeries.  Past Medical History  Diagnosis Date  . Diverticulitis   . Fibromyalgia   . Substance abuse     10 years ago ( cocaine)  . Bronchitis   . Anxiety     History  Smoking status  . Current Every Day Smoker -- 1.00 packs/day for 18 years  . Types: Cigarettes  Smokeless tobacco  . Never Used    Comment: 1-800 number given     Family History  Problem Relation Age of Onset  . Hypertension Mother   . Arthritis Mother   . Cancer Father     lung    Current Outpatient Prescriptions on File Prior to Visit  Medication Sig Dispense Refill  . albuterol (PROVENTIL HFA;VENTOLIN HFA) 108 (90 BASE) MCG/ACT inhaler Inhale 2 puffs into the lungs every 6 (six) hours as needed for wheezing.  1 Inhaler  0  . aspirin 325 MG tablet Take 325 mg by mouth daily.        Marland Kitchen azithromycin (ZITHROMAX Z-PAK) 250 MG tablet Take taper as directed  6 each  0  . cephALEXin (KEFLEX) 500 MG capsule Take 1 capsule (500 mg total) by mouth 2 (two) times daily.  14 capsule  0  . cephALEXin (KEFLEX) 500 MG capsule Take 1 capsule (500 mg total) by mouth 3 (three) times daily.  21 capsule  0  . cyclobenzaprine (FLEXERIL) 5 MG tablet Take 1 tablet (5 mg total) by mouth 3 (three) times daily as needed for muscle spasms.  30 tablet  1  . diclofenac sodium (VOLTAREN) 1 % GEL Apply 2 g topically 2 (two) times daily as needed.  100 g  1  . ibuprofen (ADVIL,MOTRIN) 600 MG tablet Take 1 tablet (600 mg total) by mouth every 8 (eight) hours. Three times a day scheduled for 1-2 weeks. Then take as  needed.  90 tablet  1  . lidocaine (LIDODERM) 5 % Place 1 patch onto the skin daily. Remove & Discard patch within 12 hours or as directed by MD      . [place] [employer] [facility]- over the counter medicine      . oxyCODONE-acetaminophen (ROXICET) 5-325 MG per tablet Take 1 tablet by mouth 2 (two) times daily.  30 tablet  0  . QUEtiapine (SEROQUEL) 100 MG tablet Take 3 tablets (300 mg total) by mouth at bedtime. Start: 50mg  PO qhs x1, then 100 mg PO qhsx1, then 200mg  PO qhs, then 300 mg PO qhs for remainder of prescription  90 tablet  1  . Simethicone (GAS-X PO) Take by mouth.       No current facility-administered medications on file prior to visit.    ROS: Per HPI.  All other systems reviewed and are negative.   Physical Exam Filed Vitals:   07/16/13 1639  BP: 104/68  Pulse: 95  Temp: 97.8 F (36.6 C)    Physical Examination: General appearance - alert, minimal distress  Mouth - mucous membranes moist, pharynx normal without lesions Heart - normal rate and regular  rhythm Abdomen - Soft, TTP suprapubic region but no TTP in RLQ/LLQ, NABS, negative rovsing/Mcburney/psoas/obturator sign.  No HSM

## 2013-07-16 NOTE — Patient Instructions (Signed)
Viral Gastroenteritis Viral gastroenteritis is also known as stomach flu. This condition affects the stomach and intestinal tract. It can cause sudden diarrhea and vomiting. The illness typically lasts 3 to 8 days. Most people develop an immune response that eventually gets rid of the virus. While this natural response develops, the virus can make you quite ill.  SEEK IMMEDIATE MEDICAL CARE IF:   You are unable to keep fluids down.  You do not urinate at least once every 6 to 8 hours.  You develop shortness of breath.  You notice blood in your stool or vomit. This may look like coffee grounds.  You have abdominal pain that increases or is concentrated in one small area (localized).  You have persistent vomiting or diarrhea.  You have a fever.  The patient is a child younger than 3 months, and he or she has a fever.  The patient is a child older than 3 months, and he or she has a fever and persistent symptoms.  The patient is a child older than 3 months, and he or she has a fever and symptoms suddenly get worse.  The patient is a baby, and he or she has no tears when crying. MAKE SURE YOU:   Understand these instructions.  Will watch your condition.  Will get help right away if you are not doing well or get worse. Document Released: 05/28/2005 Document Revised: 08/20/2011 Document Reviewed: 03/14/2011 [facility] Patient Information 2014 [place].

## 2013-07-16 NOTE — Assessment & Plan Note (Signed)
Pt c/o one day history of non bloody, non bilious vomiting that started this AM along with clear diarrhea ( non blood/non mucous).  She denies fever, chills, sweats, and states she has had flatulence.  Denies any recent raw meat, raw eggs, or eating at restaurant.  She has had two episodes of spontaneous SBO in the past secondary to previous abdominal surgeries.  On exam, she has generalized suprapubic pain, no RLQ no Mcburney's, negative rovsing, negative Psoas/negative obturator sign and denies any anorexia.  At this point w/o blood in vomitus or stool and no evidence of pain out of proportion on exam concerning for mesenteric ischemia or SBO since pt is passing flatus and stool, most likely viral gastroenteritis.  Will Rx Phenergan as pt does not want zofran and recommend imodium if pt diarrhea is intractable.  F/U PRN and red flags discussed including fever w/ anorexia, blood in stool or vomitus, or lethargy to go to the ED.  Pt voices understanding.

## 2013-08-12 ENCOUNTER — Other Ambulatory Visit: Payer: Self-pay | Admitting: Family Medicine

## 2013-08-12 DIAGNOSIS — Z1231 Encounter for screening mammogram for malignant neoplasm of breast: Secondary | ICD-10-CM

## 2013-08-18 ENCOUNTER — Ambulatory Visit (HOSPITAL_COMMUNITY): Payer: Medicare HMO

## 2013-08-19 ENCOUNTER — Encounter: Payer: Self-pay | Admitting: Family Medicine

## 2013-09-08 ENCOUNTER — Ambulatory Visit (HOSPITAL_COMMUNITY)
Admission: RE | Admit: 2013-09-08 | Discharge: 2013-09-08 | Disposition: A | Discharge status: Home / Self Care | Payer: Medicare HMO | Source: Ambulatory Visit | Attending: Family Medicine | Admitting: Family Medicine

## 2013-09-08 DIAGNOSIS — Z1231 Encounter for screening mammogram for malignant neoplasm of breast: Secondary | ICD-10-CM | POA: Insufficient documentation

## 2013-10-15 ENCOUNTER — Encounter: Payer: Self-pay | Admitting: Family Medicine

## 2013-10-15 ENCOUNTER — Ambulatory Visit (INDEPENDENT_AMBULATORY_CARE_PROVIDER_SITE_OTHER): Payer: Commercial Managed Care - HMO | Admitting: Family Medicine

## 2013-10-15 VITALS — BP 137/89 | HR 79 | Temp 98.2°F | Ht 64.0 in | Wt 222.2 lb

## 2013-10-15 DIAGNOSIS — M542 Cervicalgia: Secondary | ICD-10-CM

## 2013-10-15 DIAGNOSIS — M79605 Pain in left leg: Secondary | ICD-10-CM

## 2013-10-15 DIAGNOSIS — M79609 Pain in unspecified limb: Secondary | ICD-10-CM

## 2013-10-15 DIAGNOSIS — M5430 Sciatica, unspecified side: Secondary | ICD-10-CM

## 2013-10-15 MED ORDER — TRAMADOL HCL 50 MG PO TABS: 50.0000 mg | ORAL_TABLET | Freq: Three times a day (TID) | ORAL | Status: DC | PRN

## 2013-10-15 MED ORDER — CYCLOBENZAPRINE HCL 5 MG PO TABS: 5.0000 mg | ORAL_TABLET | Freq: Three times a day (TID) | ORAL | Status: DC | PRN

## 2013-10-15 MED ORDER — PROMETHAZINE HCL 25 MG PO TABS: 12.5000 mg | ORAL_TABLET | Freq: Four times a day (QID) | ORAL | Status: DC | PRN

## 2013-10-15 MED ORDER — NAPROXEN 500 MG PO TABS: 500.0000 mg | ORAL_TABLET | Freq: Two times a day (BID) | ORAL | Status: DC

## 2013-10-15 NOTE — Progress Notes (Signed)
Patient says that she filled out a form a long time ago to have her PCP changed and that she still has not heard anything concerning this.  Please call her.  Thanks.

## 2013-10-15 NOTE — Progress Notes (Signed)
Patient completed PCP Change request form on 05/01/13.  Attempted to call patient back and left message to call our office back.  Did not hear back from patient.  Will call patient today.  Patient's form stated her reason for requesting change--"No real complaint.  Just feel Dr. Lucita Lora method of holistic med. treatment is not for me."  "Thinks Dr. Raoul Pitch is a great doctor."  States she was seen for pain during previous visit and was told "do not ask me for refills because I will not write another prescription for narcotics.  You will be referred back to orthopedics."  Patient states that she felt "if I really got in a bind with some pain and she referred me to orthopedics, I would have to wait to be seen and she would not help me."  Was initially requesting Dr. Thomes Dinning, but changed her request to Dr. Lacinda Axon since [place] will be graduating soon.  Will route note to Drs. Gwenlyn Saran and [facility] for review and call patient back.

## 2013-10-15 NOTE — Assessment & Plan Note (Signed)
Pain regimen with NSAIDs, Tylenol and Tramadol for break through. Xrays of lumbar/sacral spine. No neurologic deficits that would warrant more aggressive imaging at this time but if that were the case MRI will be indicated. Close f/u.

## 2013-10-15 NOTE — Progress Notes (Signed)
Family Medicine Office Visit Note   Subjective:   Patient ID: Alyssa Montgomery, female  DOB: 1960/07/15, 53 y.o.. MRN: [id number]   Pt that comes today complaining of left leg pain for about 8-9 months. It was originally located posterior to her thigh but now has moved down to knee and all the way to her heel. Pain is constant, nagging and wakes her up at night. She has been using Advil and Tylenol for pain with no improvement of her symptoms. She also resports moderated back pain, denies history of trauma or sudden event that could correlate with initiation of symptoms. No fever, chills, vomiting, headaches or other systemic symptoms. Denies leg giving away, numbness or tingling.   Review of Systems:  Per HPI  Objective:   Physical Exam: Gen:  Obese, NAD HEENT: Moist mucous membranes  CV: Regular rate and rhythm, no murmurs rubs or gallops PULM: Clear to auscultation bilaterally. No wheezes/rales/rhonchi ABD: Soft, non tender, non distended, normal bowel sounds EXT: No edema, erythema or deformity. Back - Normal skin, Spine with normal alignment and no deformity.  No tenderness to vertebral process palpation.  Paraspinous muscles are not tender and without spasm.  Range of motion is full at lumbar-sacral regions. Straight leg raise is positive on the left. Tenderness to palpation on sacroiliac joint. No leg length discrepancy. Pulses are intact as well as sensation and strength.  Neuro: Alert and oriented x3. No focalization  Assessment & Plan:

## 2013-10-15 NOTE — Patient Instructions (Addendum)
This is you pain control regimen. Naproxen every 12 h Tylenol every 8 h Tramadol for break through pain.  we ordered a Xray of your back and we will discuss results with you.  F/u in 2-3 weeks or sooner if needed.

## 2013-10-19 NOTE — Progress Notes (Signed)
Discussed with Dr. Raoul Pitch on 10/16/2013.  She agreed to review the chart to help determine whether this might be something they can work through (e.g. A misunderstanding or miscommunication) or whether it is best just to switch providers.  Dr. Raoul Pitch will follow up with [facility] and me.

## 2014-01-19 ENCOUNTER — Encounter: Payer: Self-pay | Admitting: *Deleted

## 2014-01-19 NOTE — Progress Notes (Signed)
Patient completed PCP change request form--states "No real complaint.  Just feel her method of holistic medicine treatment is not for me.  I want a doctor who wont put median (?) ane me to a point of question because of what you believe."  Patient requesting a female PCP.  DIscussed PCP change process with patient.  Will change PCP to Dr. Junie Panning.  Appt scheduled for 01/21/14 to establish care with new PCP.  Burna Forts, BSN, RN-BC

## 2014-01-21 ENCOUNTER — Ambulatory Visit (INDEPENDENT_AMBULATORY_CARE_PROVIDER_SITE_OTHER): Payer: Commercial Managed Care - HMO | Admitting: Family Medicine

## 2014-01-21 ENCOUNTER — Encounter: Payer: Self-pay | Admitting: Family Medicine

## 2014-01-21 VITALS — BP 115/72 | HR 80 | Temp 98.0°F | Wt 226.0 lb

## 2014-01-21 DIAGNOSIS — M542 Cervicalgia: Secondary | ICD-10-CM

## 2014-01-21 DIAGNOSIS — Z0000 Encounter for general adult medical examination without abnormal findings: Secondary | ICD-10-CM

## 2014-01-21 DIAGNOSIS — Z791 Long term (current) use of non-steroidal anti-inflammatories (NSAID): Secondary | ICD-10-CM

## 2014-01-21 DIAGNOSIS — M5430 Sciatica, unspecified side: Secondary | ICD-10-CM

## 2014-01-21 MED ORDER — NAPROXEN 500 MG PO TABS: 500.0000 mg | ORAL_TABLET | Freq: Two times a day (BID) | ORAL | Status: DC

## 2014-01-21 MED ORDER — CYCLOBENZAPRINE HCL 5 MG PO TABS: 5.0000 mg | ORAL_TABLET | Freq: Three times a day (TID) | ORAL | Status: DC | PRN

## 2014-01-21 MED ORDER — PROMETHAZINE HCL 25 MG PO TABS: 12.5000 mg | ORAL_TABLET | Freq: Four times a day (QID) | ORAL | Status: DC | PRN

## 2014-01-21 MED ORDER — DICLOFENAC SODIUM 1 % TD GEL: 2.0000 g | Freq: Two times a day (BID) | TRANSDERMAL | Status: DC | PRN

## 2014-01-21 NOTE — Assessment & Plan Note (Signed)
-  Refilled prescription of Flexeril, Voltaren, and Naproxen for neck/shoulder pain.  If no relief with medication, consider referral to Orthopedics. -Consider OMM or trigger point inject in future. -Stressed to her that I thought the pain deserved my fullest attention and that I did not have time to fully evaluate it today during her preventative visit.  Suggested scheduling another appointment to further evaluate her pain so it can get the attention it deserves.

## 2014-01-21 NOTE — Addendum Note (Signed)
Addended by: Lorna Few on: 01/21/2014 06:17 PM   Modules accepted: Orders

## 2014-01-21 NOTE — Progress Notes (Signed)
Subjective:     Patient ID: Alyssa Montgomery, female   DOB: Jul 05, 1960, 53 y.o.   MRN: [id number]  HPI Alyssa Montgomery is a 53yo female presenting for a wellness visit.  She has had a mammogram in 2015, colonoscopy in 2011 showing diverticulosis and recommending follow-up in 10years, and a pap smear in 2014.  She is out of all of her medications, including Flexeril 5mg , Voltaren Gel, Naproxen 500mg , and Phenergan25mg . She complains of chronic bilateral shoulder pain worse in left shoulder and pain extending from hip down to back of knee.  She has been to [facility] in the past and has had multiple MRIs done.  She has chronic nausea and takes the Phenergen every few days for it.  She has tried other anti-nausea medications in the past but they made her sick.  She would like to be screened for diabetes today.  She stated that she tries to eat healthy, but continues to put on weight.  She eats two large meals a day and does not snack.  She has bought a Building services engineer card, but has not used it very much.  She states that once she gets to the gym she doesn't mind working out, but she has a hard time finding the motivation to get there.  She would like to meet with a nutritionist so she can learn how to manage her meals better and lose weight.  She smokes 1 pack of cigarettes over two days.  She expresses interest in quitting smoking and would like to try the Nicotine Patch.  She has history of Depression, but states that she is no longer taking her Seroquel.  She does not feel that she needs medications, however she admits that exercise and quitting smoking would help her feel better. No further complaints today.  Review of Systems  All other systems reviewed and are negative.     Objective:   Physical Exam  Constitutional: She is oriented to person, place, and time. She appears well-developed and well-nourished. No distress.  HENT:  Head: Normocephalic and atraumatic.  Cardiovascular: Normal rate and  regular rhythm.  Exam reveals no gallop and no friction rub.   No murmur heard. Pulmonary/Chest: Effort normal and breath sounds normal. No respiratory distress. She has no wheezes. She has no rales.  Abdominal: Soft. Bowel sounds are normal. She exhibits no distension and no mass. There is no tenderness.  Musculoskeletal:  Several focal points of tenderness along shoulders bilaterally; no decrease in ROM appreciated in shoulders; tenderness over left SI joint and at left piriformis  Neurological: She is alert and oriented to person, place, and time.   Filed Vitals:   01/21/14 1422  BP: 115/72  Pulse: 80  Temp: 98 F (36.7 C)      Assessment:     Please see Problem List for Assessment.      Plan:     Please see Problem List for Plan.

## 2014-01-21 NOTE — Assessment & Plan Note (Addendum)
-  CBC, BMET, and Lipid Panel not indicated at this time. -Instructed to return for Fasting Glucose this week or next week after fasting for at least 8 hours. -Up to date on Mammogram (2015), Colonoscopy (2011), and Pap Smear (2014). -Given contact information for [facility] for Nutrition Consult  -Discussed eating three small meals and two snacks to spread calories throughout day.  -Stressed importance of exercise.  Pointed out that the biggest barrier for her seems to be initiating exercise and that once she has gotten to the gym and began her workout she has no problem exercising for an adequate time period.  Motivated her to try initiating her workout and if after 5-55minutes she doesn't feel like working out any more she could stop.  Also discussed using things she enjoys as exercise, such as dancing.  Stressed that exercise would help her pain, mood, and weight. -Counseled on tobacco cessation.  Expressed interest in quitting.  Plans on trying over the counter Nicotine patches. -Refilled Flexeril, Voltaren, Naproxen, and Phenergan.  Discussed dangers of using Phenergan every day.  Plan to further evaluate nausea and wean off of Phenergan at next visit. -Consider PHQ-9 at future visit to assess status of Depression.

## 2014-01-21 NOTE — Assessment & Plan Note (Addendum)
-  Refilled prescription of Flexeril, Voltaren, and Naproxen for sciatic pain.  If no relief with medication, consider referral to Orthopedics. -Consider OMM or trigger point inject in future. -Stressed to her that I thought the pain deserved my fullest attention and that I did not have time to fully evaluate it today during her preventative visit.  Suggested scheduling another appointment to further evaluate her pain so it can get the attention it deserves.

## 2014-01-21 NOTE — Patient Instructions (Signed)
Thank you so much for coming to visit me today! I will refill your medications as needed for today.  I am hoping that once you start back on your Flexeril, Voltaren Gel, and Naproxen, your shoulder and leg pain will improve.  If not, please come back for a visit so I can further evaluate the pain!    I have a arranged for you to come in for a Fasting Glucose test to check for diabetes.  Please return within the next week or two fasting for at least 8hours so we can check your blood sugar.  You are up to date on your mammogram (2015), colonoscopy (2011), and pap smear (2014).    I am so glad you are considering cutting back on the cigarettes!  Nicotine patches are over the counter and you may use these to help with your cravings for Nicotine.  If you are interested in Nutrition counseling, you may call and schedule an appointment with Dr. [facility] at [phone]. I would encourage you to begin eating three meals throughout the day along with two snacks instead of eating two big meals.  I would also encourage you to try exercising more... I know it is very hard to get started, but I know you can do it!  It is also important to remember that it is okay to start small and try to do things you enjoy as exercise.Marland KitchenMarland KitchenI know you mentioned dancing and I think that would be great exercise for you!  Thanks again for coming to see me, Dr. Gerlean Ren

## 2014-04-12 ENCOUNTER — Encounter: Payer: Self-pay | Admitting: Family Medicine

## 2014-05-17 ENCOUNTER — Telehealth: Payer: Self-pay | Admitting: Family Medicine

## 2014-05-17 DIAGNOSIS — S0502XD Injury of conjunctiva and corneal abrasion without foreign body, left eye, subsequent encounter: Secondary | ICD-10-CM

## 2014-05-17 DIAGNOSIS — S0502XA Injury of conjunctiva and corneal abrasion without foreign body, left eye, initial encounter: Secondary | ICD-10-CM | POA: Insufficient documentation

## 2014-05-17 NOTE — Telephone Encounter (Signed)
Pt was seen over the weekend for corneal abrasion of the left eye. Pt was seen for a f/u today, pt has [facility] and they need a referral. ICD 10 code is s05.02xd Call with questions.

## 2014-05-17 NOTE — Telephone Encounter (Signed)
Diagnosis placed in problem list and ambulatory referral made to Ophthalmology.

## 2014-06-17 ENCOUNTER — Ambulatory Visit (INDEPENDENT_AMBULATORY_CARE_PROVIDER_SITE_OTHER): Payer: PPO | Admitting: Family Medicine

## 2014-06-17 ENCOUNTER — Encounter: Payer: Self-pay | Admitting: Family Medicine

## 2014-06-17 VITALS — BP 112/78 | HR 83 | Temp 98.2°F | Ht 64.0 in | Wt 229.2 lb

## 2014-06-17 DIAGNOSIS — M797 Fibromyalgia: Secondary | ICD-10-CM

## 2014-06-17 DIAGNOSIS — M542 Cervicalgia: Secondary | ICD-10-CM

## 2014-06-17 DIAGNOSIS — M25519 Pain in unspecified shoulder: Secondary | ICD-10-CM

## 2014-06-17 MED ORDER — CYCLOBENZAPRINE HCL 10 MG PO TABS: 5.0000 mg | ORAL_TABLET | Freq: Three times a day (TID) | ORAL | Status: DC | PRN

## 2014-06-17 MED ORDER — DICLOFENAC SODIUM 1 % TD GEL: 2.0000 g | Freq: Two times a day (BID) | TRANSDERMAL | Status: DC | PRN

## 2014-06-17 MED ORDER — KETOROLAC TROMETHAMINE 30 MG/ML IJ SOLN
30.0000 mg | Freq: Once | INTRAMUSCULAR | Status: AC
  Administered 2014-06-17: 30 mg via INTRAMUSCULAR

## 2014-06-17 NOTE — Patient Instructions (Addendum)
Thank you so much for coming to visit me today! We will increase your dose of Flexeril to 5mg  TID. I am also refilling your Voltaren gel. You were given a Toradol shot for pain prior to leaving the office.  Please continue to exercise as I believe this will also help your pain.   Please follow up in one month.  Thanks again! Dr. Gerlean Ren    Fibromyalgia Fibromyalgia is a disorder that is often misunderstood. It is associated with muscular pains and tenderness that comes and goes. It is often associated with fatigue and sleep disturbances. Though it tends to be long-lasting, fibromyalgia is not life-threatening. CAUSES  The exact cause of fibromyalgia is unknown. People with certain gene types are predisposed to developing fibromyalgia and other conditions. Certain factors can play a role as triggers, such as:  Spine disorders.  Arthritis.  Severe injury (trauma) and other physical stressors.  Emotional stressors. SYMPTOMS   The main symptom is pain and stiffness in the muscles and joints, which can vary over time.  Sleep and fatigue problems. Other related symptoms may include:  Bowel and bladder problems.  Headaches.  Visual problems.  Problems with odors and noises.  Depression or mood changes.  Painful periods (dysmenorrhea).  Dryness of the skin or eyes. DIAGNOSIS  There are no specific tests for diagnosing fibromyalgia. Patients can be diagnosed accurately from the specific symptoms they have. The diagnosis is made by determining that nothing else is causing the problems. TREATMENT  There is no cure. Management includes medicines and an active, healthy lifestyle. The goal is to enhance physical fitness, decrease pain, and improve sleep. HOME CARE INSTRUCTIONS   Only take over-the-counter or prescription medicines as directed by your caregiver. Sleeping pills, tranquilizers, and pain medicines may make your problems worse.  Low-impact aerobic exercise is very  important and advised for treatment. At first, it may seem to make pain worse. Gradually increasing your tolerance will overcome this feeling.  Learning relaxation techniques and how to control stress will help you. Biofeedback, visual imagery, hypnosis, muscle relaxation, yoga, and meditation are all options.  Anti-inflammatory medicines and physical therapy may provide short-term help.  Acupuncture or massage treatments may help.  Take muscle relaxant medicines as suggested by your caregiver.  Avoid stressful situations.  Plan a healthy lifestyle. This includes your diet, sleep, rest, exercise, and friends.  Find and practice a hobby you enjoy.  Join a fibromyalgia support group for interaction, ideas, and sharing advice. This may be helpful. SEEK MEDICAL CARE IF:  You are not having good results or improvement from your treatment. FOR MORE INFORMATION  National Fibromyalgia Association: www.fmaware.[place]: www.arthritis.org Document Released: 05/28/2005 Document Revised: 08/20/2011 Document Reviewed: 09/07/2009 [facility] Patient Information 2015 [place], [place]. This information is not intended to replace advice given to you by your health care provider. Make sure you discuss any questions you have with your health care provider.

## 2014-06-18 DIAGNOSIS — M797 Fibromyalgia: Secondary | ICD-10-CM | POA: Insufficient documentation

## 2014-06-18 HISTORY — DX: Fibromyalgia: M79.7

## 2014-06-18 MED ORDER — CYCLOBENZAPRINE HCL 10 MG PO TABS: 5.0000 mg | ORAL_TABLET | Freq: Three times a day (TID) | ORAL | Status: DC | PRN

## 2014-06-18 NOTE — Progress Notes (Signed)
Subjective:     Patient ID: Alyssa Montgomery, female   DOB: 24-Sep-1960, 54 y.o.   MRN: [id number]  HPI Alyssa Montgomery is a 54yo female presenting for worsening neck, shoulder, and back pain. - States pain is constant 24/7 with occasional peaks in pain - Pain 10/10 - Worse on R>L - States pain affects her sleep. Sleeps with multiple pillows to aid in pain - Has seen an orthopod in past for pain. Has received epidurals for pain which helped. Stopped seeing orthopod because she could no longer afford appointments. - Exercises regularly - Requests prescription for oxycodone. States she tried a pill from one of her friends and it really relieved the pain - Reports history of fibromyalgia diagnosis 4years ago at this clinic. States she stopped taking the medications they originally prescribed her because she didn't believe the diagnosis - Needs letter stating she can take medication at work - Needs refill of cyclobenzaprine and voltaren gel, which she states really helps the pain - Chart Review shows Fibromyalgia mentioned in past medical history, however it does not describe workup.  Review of Systems  Musculoskeletal: Positive for myalgias, back pain, arthralgias and neck pain.       Objective:   Physical Exam  Constitutional: She is oriented to person, place, and time. No distress.  Cardiovascular: Normal rate and regular rhythm.  Exam reveals no gallop and no friction rub.   No murmur heard. Pulmonary/Chest: Breath sounds normal. No respiratory distress. She has no wheezes. She has no rales.  Musculoskeletal: She exhibits no edema.  Multiple tenderpoints noted along shoulders and neck bilaterally. Trapezius tight bilaterally. Tenderpoints in lower back and along piriformis L>R  Neurological: She is alert and oriented to person, place, and time.  Skin: She is not diaphoretic.       Assessment:     Please refer to Problem List for Assessment.    Plan:     Please refer to Problem List for  Plan.

## 2014-06-18 NOTE — Assessment & Plan Note (Addendum)
-   Diagnosed in past, but seems to have disappeared from problem list. Chart review shows no work up or treatment in past. - Past medical history includes depression, currently treated with Seroquel - Symptoms appear consistent with fibromyalgia, with multiple tenderpoints along neck and shoulders as well as lower back and buttocks. - OMM attempted to relieve tenderpoints. Pain would transfer to another point before returning to previously treated point a few minutes later. - Toradol injection given in clinic to help with acute flare of pain - Recommend one week of Flexeril 5mg  TID. Needs work note to take Flexeril at work. Will leave up front for pick up on Monday 1/11  - Will then contact patient concerning effectiveness. Anticipate transition to Flexeril 10mg  at night. If not tolerated will decrease dose to 5mg  at night. - Refill of Voltaren gel - Encouraged exercise and stretching of tight musculature - Encouraged sleep. Suspect acute pain flare preventing effective sleep. Hope that treatment of acute flare will aid in sleep and therefore help manage more effectively - Consider addition of Amitriptyline or Nortriptyline at next visit if indicated. - Opioids contraindicated with fibromyalgia - Return in 1 month

## 2014-06-21 ENCOUNTER — Encounter: Payer: Self-pay | Admitting: Family Medicine

## 2014-06-22 ENCOUNTER — Ambulatory Visit (INDEPENDENT_AMBULATORY_CARE_PROVIDER_SITE_OTHER): Payer: PPO | Admitting: *Deleted

## 2014-06-22 DIAGNOSIS — Z111 Encounter for screening for respiratory tuberculosis: Secondary | ICD-10-CM

## 2014-06-22 NOTE — Progress Notes (Signed)
   PPD placed Left Forearm.  Pt to return 06/24/2014 for reading.  Pt tolerated intradermal injection. Derl Barrow, RN

## 2014-06-24 ENCOUNTER — Encounter: Payer: Self-pay | Admitting: *Deleted

## 2014-06-24 ENCOUNTER — Telehealth: Payer: Self-pay | Admitting: Family Medicine

## 2014-06-24 ENCOUNTER — Ambulatory Visit (INDEPENDENT_AMBULATORY_CARE_PROVIDER_SITE_OTHER): Payer: PPO | Admitting: *Deleted

## 2014-06-24 DIAGNOSIS — Z7689 Persons encountering health services in other specified circumstances: Secondary | ICD-10-CM

## 2014-06-24 DIAGNOSIS — Z111 Encounter for screening for respiratory tuberculosis: Secondary | ICD-10-CM

## 2014-06-24 LAB — TB SKIN TEST
Induration: 0 mm
TB Skin Test: NEGATIVE

## 2014-06-24 NOTE — Telephone Encounter (Signed)
Called Alyssa Montgomery to follow up on pain.  States shot she received in clinic helped pain for 1-2 days. She has been unable to fill her prescription yet because she has a new insurance, but she is hoping to fill it within the next day or two. Discussed plan to take Flexeril TID before transitioning to one pill before she goes to bed. Agreed with plan. States she is sleeping much better than at last office visit. Picked up letter left at front desk for work.

## 2014-06-24 NOTE — Progress Notes (Signed)
   PPD Reading Note PPD read and results entered in [facility]. Result: 0 mm induration. Interpretation: Negative Allergic reaction: no Derl Barrow, RN

## 2014-08-05 ENCOUNTER — Ambulatory Visit (INDEPENDENT_AMBULATORY_CARE_PROVIDER_SITE_OTHER): Payer: PPO | Admitting: Family Medicine

## 2014-08-05 VITALS — BP 118/74 | HR 86 | Temp 97.9°F | Ht 64.0 in | Wt 233.0 lb

## 2014-08-05 DIAGNOSIS — R1030 Lower abdominal pain, unspecified: Secondary | ICD-10-CM | POA: Insufficient documentation

## 2014-08-05 DIAGNOSIS — R300 Dysuria: Secondary | ICD-10-CM

## 2014-08-05 LAB — POCT URINALYSIS DIPSTICK
Bilirubin, UA: NEGATIVE
Blood, UA: NEGATIVE
Glucose, UA: NEGATIVE
Ketones, UA: NEGATIVE
Leukocytes, UA: NEGATIVE
Nitrite, UA: NEGATIVE
Protein, UA: NEGATIVE
Spec Grav, UA: 1.02
Urobilinogen, UA: 1
pH, UA: 7

## 2014-08-05 NOTE — Progress Notes (Signed)
   Subjective:    Patient ID: Renette Butters, female    DOB: Aug 25, 1960, 54 y.o.   MRN: [id number]  HPI 54 year old female presents for same day appointment with concerns of UTI.  1) Concern of UTI  Patient reports a three-week history of cloudy urine.  She reports that she initially had dysuria, urinary urgency, and urinary frequency.  This subsequently resolved.  She is currently experiencing burning lower abdominal pain. Upon review the chart, patient has chronic abdominal pain as well as chronic pelvic pain of unknown etiology.  No exacerbating or relieving factors.  No interventions tried.  She is concerned that she's has urinary tract infection.  No current fever, chills, nausea, vomiting, dysuria/urinary urgency/frequency.   Review of Systems Per HPI    Objective:   Physical Exam Filed Vitals:   08/05/14 1423  BP: 118/74  Pulse: 86  Temp: 97.9 F (36.6 C)   Exam: General: well appearing, NAD. Abdominal: Soft, tender to palpation in the suprapubic region. Nondistended. No palpable organomegaly.     Assessment & Plan:  See Problem List

## 2014-08-05 NOTE — Assessment & Plan Note (Signed)
Patient with chronic pelvic/abdominal pain. Urinalysis negative today. Advised patient to follow closely with PCP. No evidence of UTI at this time.

## 2014-08-23 ENCOUNTER — Ambulatory Visit (INDEPENDENT_AMBULATORY_CARE_PROVIDER_SITE_OTHER): Payer: PPO | Admitting: Family Medicine

## 2014-08-23 VITALS — BP 125/81 | HR 90 | Temp 98.3°F | Ht 64.0 in | Wt 233.0 lb

## 2014-08-23 DIAGNOSIS — R194 Change in bowel habit: Secondary | ICD-10-CM

## 2014-08-23 DIAGNOSIS — R220 Localized swelling, mass and lump, head: Secondary | ICD-10-CM

## 2014-08-23 DIAGNOSIS — M542 Cervicalgia: Secondary | ICD-10-CM

## 2014-08-23 DIAGNOSIS — R69 Illness, unspecified: Secondary | ICD-10-CM

## 2014-08-23 DIAGNOSIS — M797 Fibromyalgia: Secondary | ICD-10-CM

## 2014-08-23 DIAGNOSIS — R221 Localized swelling, mass and lump, neck: Secondary | ICD-10-CM

## 2014-08-23 MED ORDER — CYCLOBENZAPRINE HCL 5 MG PO TABS: 5.0000 mg | ORAL_TABLET | Freq: Every day | ORAL | Status: DC

## 2014-08-23 MED ORDER — DICLOFENAC SODIUM 1 % TD GEL: 2.0000 g | Freq: Two times a day (BID) | TRANSDERMAL | Status: DC | PRN

## 2014-08-23 MED ORDER — KETOROLAC TROMETHAMINE 30 MG/ML IJ SOLN
30.0000 mg | Freq: Once | INTRAMUSCULAR | Status: AC
  Administered 2014-08-23: 30 mg via INTRAVENOUS

## 2014-08-23 MED ORDER — NICOTINE 7 MG/24HR TD PT24: 7.0000 mg | MEDICATED_PATCH | Freq: Every day | TRANSDERMAL | Status: DC

## 2014-08-23 NOTE — Patient Instructions (Signed)
Thank you so much for coming to visit me today! We will give you a shot of pain medication today! We will also refill your Voltarin and Flexeril. Please let us know if you need a refill! We will get some lab work today and we will let you know if anything is abnormal!  Thanks again! Dr. Gerlean Ren

## 2014-08-23 NOTE — Progress Notes (Signed)
Subjective:     Patient ID: Alyssa Montgomery, female   DOB: 1961/04/11, 54 y.o.   MRN: [id number]  HPI Alyssa Montgomery is a 54yo female presenting for general exam. - Scheduled appointment for pap smear. Last pap smear in 2014 normal - Would like lab work done today - Last CMP in 2014 normal - Complains of anterior neck swelling and pain. States skin has been breaking out more, but notes no other changes. Last TSH normal in 2014 - Complain of pain all over body and previously diagnosed with Fibromyalgia. Pain today located in left lower leg along fibula, posterior left knee, left hip, shoulders bilaterally along trapezius Right>Left, neck, left jaw, and left SI joint. Has never picked up her prescription for Flexeril from last visit. Would like shot of toradol today since that helped her significantly at last visit. - Complains of change in bowel movents. States she had constipation all of her life, but in the last few months her stool has been soft. Would like to be referred to colonoscopy today. - Last mammogram in March 2015. Would like to go for mammogram - Smokes 1pack every 3 days. States she is interested in quitting, but could not afford nicotine patches. Would like prescription today to help with cost. Has been smoking since she was 54yo.  Review of Systems  Respiratory: Negative for shortness of breath.   Cardiovascular: Negative for chest pain and leg swelling.  Musculoskeletal: Positive for myalgias, back pain, arthralgias and neck pain.       Objective:   Physical Exam  Constitutional: She is oriented to person, place, and time. She appears well-developed and well-nourished. No distress.  HENT:  Head: Normocephalic and atraumatic.  Neck: Normal range of motion. Neck supple. No thyromegaly present.  Cardiovascular: Normal rate and regular rhythm.  Exam reveals no gallop and no friction rub.   No murmur heard. Pulmonary/Chest: Effort normal. No respiratory distress. She has no wheezes.  She has no rales.  Abdominal: Soft. She exhibits no distension. There is no tenderness.  Musculoskeletal: Normal range of motion. She exhibits no edema.  Tender points noted along lateral aspect of lower leg along fibula, posterior lateral knee, numerous points along trapezius bilaterally, right neck, and left jaw. Crepitus noted in knees bilaterally, left>right.  Neurological: She is alert and oriented to person, place, and time.  Skin: Skin is warm. No rash noted.  Psychiatric: She has a normal mood and affect. Her behavior is normal.       Assessment:     Please refer to Problem List for Assessment.    Plan:     Please refer to Problem List for Plan.  Handout given for Mammogram and Colonoscopy. Not due for Pap Smear.

## 2014-08-24 LAB — COMPLETE METABOLIC PANEL WITH GFR
ALT: 18 U/L (ref 0–35)
AST: 15 U/L (ref 0–37)
Albumin: 4.1 g/dL (ref 3.5–5.2)
Alkaline Phosphatase: 96 U/L (ref 39–117)
BUN: 18 mg/dL (ref 6–23)
CO2: 25 mEq/L (ref 19–32)
Calcium: 8.9 mg/dL (ref 8.4–10.5)
Chloride: 106 mEq/L (ref 96–112)
Creat: 0.85 mg/dL (ref 0.50–1.10)
GFR, Est African American: >89 mL/min
GFR, Est Non African American: 78 mL/min
Glucose, Bld: 107 mg/dL — ABNORMAL HIGH (ref 70–99)
Potassium: 4.1 mEq/L (ref 3.5–5.3)
Sodium: 141 mEq/L (ref 135–145)
Total Bilirubin: 0.2 mg/dL (ref 0.2–1.2)
Total Protein: 6.6 g/dL (ref 6.0–8.3)

## 2014-08-24 LAB — TSH: TSH: 1.236 u[IU]/mL (ref 0.350–4.500)

## 2014-08-24 NOTE — Assessment & Plan Note (Signed)
-   Toradol shot today - Refilled prescription of Flexeril - Discussed influence of sleep in fibromyalgia pain. Discussed improved sleeping habits. - OMM: myofascial release of posterior left knee, right neck, and shoulders bilaterally with resolution of pain. Continues to note pain in left SI joint

## 2014-08-27 ENCOUNTER — Other Ambulatory Visit: Payer: Self-pay | Admitting: Family Medicine

## 2014-08-27 DIAGNOSIS — Z1231 Encounter for screening mammogram for malignant neoplasm of breast: Secondary | ICD-10-CM

## 2014-09-06 ENCOUNTER — Telehealth: Payer: Self-pay | Admitting: Family Medicine

## 2014-09-06 NOTE — Telephone Encounter (Signed)
Pt called and would like to know her lab results. jw

## 2014-09-07 NOTE — Telephone Encounter (Signed)
-----   Message from [facility], [place] sent at 09/06/2014  3:18 PM EDT ----- Please let Mrs. Bahri know that her lab results came back normal.  [facility]

## 2014-09-07 NOTE — Telephone Encounter (Signed)
LVM for pt to call back to inform her of below. Katharina Caper, April D

## 2014-09-08 ENCOUNTER — Encounter: Payer: Self-pay | Admitting: *Deleted

## 2014-09-14 ENCOUNTER — Ambulatory Visit (HOSPITAL_COMMUNITY)
Admission: RE | Admit: 2014-09-14 | Discharge: 2014-09-14 | Disposition: A | Discharge status: Home / Self Care | Payer: PPO | Source: Ambulatory Visit | Attending: Internal Medicine | Admitting: Internal Medicine

## 2014-09-14 DIAGNOSIS — Z1231 Encounter for screening mammogram for malignant neoplasm of breast: Secondary | ICD-10-CM | POA: Insufficient documentation

## 2014-09-15 ENCOUNTER — Encounter: Payer: Self-pay | Admitting: Family Medicine

## 2014-09-20 ENCOUNTER — Telehealth: Payer: Self-pay | Admitting: Family Medicine

## 2014-09-20 DIAGNOSIS — M542 Cervicalgia: Secondary | ICD-10-CM

## 2014-09-20 MED ORDER — DICLOFENAC SODIUM 1 % TD GEL: 2.0000 g | Freq: Two times a day (BID) | TRANSDERMAL | Status: DC | PRN

## 2014-09-20 NOTE — Telephone Encounter (Signed)
Pt called and needs a refill on her Voltaren called in. jw

## 2014-09-20 NOTE — Telephone Encounter (Signed)
Refill sent in to pharmacy 

## 2014-09-27 NOTE — Addendum Note (Signed)
Addended by: Lorna Few on: 09/27/2014 12:07 PM   Modules accepted: Miquel Dunn

## 2014-09-28 ENCOUNTER — Telehealth: Payer: Self-pay | Admitting: Family Medicine

## 2014-09-28 DIAGNOSIS — M542 Cervicalgia: Secondary | ICD-10-CM

## 2014-09-28 DIAGNOSIS — Z720 Tobacco use: Secondary | ICD-10-CM

## 2014-09-28 NOTE — Telephone Encounter (Signed)
Pt asking to speak with Dr. Gerlean Ren, says she needs a letter stating what meds pt is using in order to function, this is for her to continue her disability.

## 2014-09-28 NOTE — Telephone Encounter (Signed)
Will forward to MD. Hayzlee Mcsorley,CMA  

## 2014-10-05 NOTE — Telephone Encounter (Signed)
Pt is calling back regarding letter needed for work/disability.  Pt stated the letter need to stated what medication she is taking and what is used for.  She is requesting Dr. Gerlean Ren to give her a call if she need more clarification.  Pt also requesting refill on Voltaren gel. Derl Barrow, RN

## 2014-10-06 MED ORDER — NAPROXEN 500 MG PO TABS: 500.0000 mg | ORAL_TABLET | Freq: Two times a day (BID) | ORAL | Status: DC

## 2014-10-06 MED ORDER — DICLOFENAC SODIUM 1 % TD GEL: 2.0000 g | Freq: Two times a day (BID) | TRANSDERMAL | Status: DC | PRN

## 2014-10-06 MED ORDER — ALBUTEROL SULFATE HFA 108 (90 BASE) MCG/ACT IN AERS: 2.0000 | INHALATION_SPRAY | Freq: Four times a day (QID) | RESPIRATORY_TRACT | Status: DC | PRN

## 2014-10-06 NOTE — Telephone Encounter (Signed)
Contacted concerning letter, which Alyssa Montgomery requests for employer. States she needs letter stating which medications she is prescribed. States she has been taking a lot of Ibuprofen, Naproxen, and Voltaren for acute shoulder pain. Neck pain noted at last visit has resolved. Counseled on dangers of taking that many NSAIDs and the complications it could have. Requests refill of Naproxen, Voltaren, and Albuterol inhaler. Will refill Voltaren and Naproxen for one month and recommend no other NSAIDs be used outside of that prescribed. Recommend close follow up in clinic due to acute shoulder pain. Agrees to call on 4/28 and schedule appointment to be seen in clinic.  Dr. Gerlean Ren

## 2014-10-08 ENCOUNTER — Telehealth: Payer: Self-pay | Admitting: Family Medicine

## 2014-10-08 NOTE — Telephone Encounter (Signed)
Pt called about the letter that Dr. Gerlean Ren was writing for her about her medications. She needs this to say that she is on these medications and that she also needs these medication to be able to function at work and in daily life. jw

## 2014-10-12 ENCOUNTER — Encounter: Payer: Self-pay | Admitting: Internal Medicine

## 2014-10-14 ENCOUNTER — Ambulatory Visit: Payer: PPO | Admitting: Family Medicine

## 2014-10-14 NOTE — Telephone Encounter (Signed)
Have not written letter yet due to scheduled appointment today 5/5 and possible changes in medications for acute shoulder pain. Will write pending visit and leave out front for Mrs. Kiner to pick up next week.

## 2014-10-19 ENCOUNTER — Ambulatory Visit: Payer: PPO | Admitting: Family Medicine

## 2014-10-21 NOTE — Telephone Encounter (Signed)
Note that Alyssa Montgomery keeps changing date of visit. Will not give note until after office visit due to anticipated medication changes. Now scheduled for 5/24.

## 2014-10-25 ENCOUNTER — Emergency Department (HOSPITAL_COMMUNITY)
Admission: EM | Admit: 2014-10-25 | Discharge: 2014-10-26 | Disposition: A | Discharge status: Home / Self Care | Payer: PPO | Attending: Emergency Medicine | Admitting: Emergency Medicine

## 2014-10-25 ENCOUNTER — Emergency Department (HOSPITAL_COMMUNITY): Payer: PPO

## 2014-10-25 ENCOUNTER — Encounter (HOSPITAL_COMMUNITY): Payer: Self-pay

## 2014-10-25 DIAGNOSIS — W1839XA Other fall on same level, initial encounter: Secondary | ICD-10-CM | POA: Diagnosis not present

## 2014-10-25 DIAGNOSIS — K5900 Constipation, unspecified: Secondary | ICD-10-CM | POA: Insufficient documentation

## 2014-10-25 DIAGNOSIS — Z8719 Personal history of other diseases of the digestive system: Secondary | ICD-10-CM | POA: Diagnosis not present

## 2014-10-25 DIAGNOSIS — S4991XA Unspecified injury of right shoulder and upper arm, initial encounter: Secondary | ICD-10-CM | POA: Diagnosis present

## 2014-10-25 DIAGNOSIS — M545 Low back pain, unspecified: Secondary | ICD-10-CM

## 2014-10-25 DIAGNOSIS — Y998 Other external cause status: Secondary | ICD-10-CM | POA: Insufficient documentation

## 2014-10-25 DIAGNOSIS — M25511 Pain in right shoulder: Secondary | ICD-10-CM

## 2014-10-25 DIAGNOSIS — F419 Anxiety disorder, unspecified: Secondary | ICD-10-CM | POA: Insufficient documentation

## 2014-10-25 DIAGNOSIS — Z880 Allergy status to penicillin: Secondary | ICD-10-CM | POA: Diagnosis not present

## 2014-10-25 DIAGNOSIS — Y9289 Other specified places as the place of occurrence of the external cause: Secondary | ICD-10-CM | POA: Diagnosis not present

## 2014-10-25 DIAGNOSIS — Z720 Tobacco use: Secondary | ICD-10-CM | POA: Diagnosis not present

## 2014-10-25 DIAGNOSIS — Z79899 Other long term (current) drug therapy: Secondary | ICD-10-CM | POA: Insufficient documentation

## 2014-10-25 DIAGNOSIS — Z8709 Personal history of other diseases of the respiratory system: Secondary | ICD-10-CM | POA: Diagnosis not present

## 2014-10-25 DIAGNOSIS — Z791 Long term (current) use of non-steroidal anti-inflammatories (NSAID): Secondary | ICD-10-CM | POA: Insufficient documentation

## 2014-10-25 DIAGNOSIS — Y9389 Activity, other specified: Secondary | ICD-10-CM | POA: Insufficient documentation

## 2014-10-25 MED ORDER — OXYCODONE-ACETAMINOPHEN 5-325 MG PO TABS
1.0000 | ORAL_TABLET | Freq: Once | ORAL | Status: AC
  Administered 2014-10-25: 1 via ORAL
  Filled 2014-10-25: qty 1

## 2014-10-25 NOTE — ED Provider Notes (Signed)
CSN: [id number]     Arrival date & time 10/25/14  2200 History  This chart was scribed for non-physician practitioner, Charlann Lange, PA-C working with Noemi Chapel, MD  by Erling Conte, ED Scribe. This patient was seen in room [place] and the patient's care was started at 11:01 PM.    Chief Complaint  Patient presents with  . Shoulder Pain  . Back Pain    The history is provided by the patient. No language interpreter was used.    HPI Comments: Alyssa Montgomery is a 54 y.o. female who presents to the Emergency Department complaining of constant, moderate, gradually worsening, bilateral shoulder pain that occurred 1 month ago. She states she fell on her porch and has been having pain ever since. She also notes some associated lower back and flank pain that radiates to her abdomen. She reports this began 8 days ago. She denies any h/o kidney problems. She states she has not had a bowel movement lately. She claims laxatives do not help with her constipation. SHe denies any urinary symptoms or fevers  Past Medical History  Diagnosis Date  . Diverticulitis   . Fibromyalgia   . Substance abuse     10 years ago ( cocaine)  . Bronchitis   . Anxiety    Past Surgical History  Procedure Laterality Date  . Bowel obstruction    . Back surgery    . Ankle surgery    . Eye surgery      removed right eye;   Marland Kitchen Ectopic pregnancy surgery     Family History  Problem Relation Age of Onset  . Hypertension Mother   . Arthritis Mother   . Cancer Father     lung   History  Substance Use Topics  . Smoking status: Current Every Day Smoker -- 0.50 packs/day for 18 years    Types: Cigarettes  . Smokeless tobacco: Never Used     Comment: 1-800 number given   . Alcohol Use: No   OB History    Gravida Para Term Preterm AB TAB SAB Ectopic Multiple Living   6 2 2  4 1 2 1  2      Review of Systems  Constitutional: Negative for fever.  Gastrointestinal: Positive for abdominal pain and  constipation.  Genitourinary: Positive for flank pain. Negative for dysuria and difficulty urinating.  Musculoskeletal: Positive for back pain.      Allergies  Penicillins and Sulfonamide derivatives  Home Medications   Prior to Admission medications   Medication Sig Start Date End Date Taking? Authorizing Provider  albuterol (PROVENTIL HFA;VENTOLIN HFA) 108 (90 BASE) MCG/ACT inhaler Inhale 2 puffs into the lungs every 6 (six) hours as needed for wheezing. 10/06/14 10/06/15  Machias N Rumley, DO  cyclobenzaprine (FLEXERIL) 5 MG tablet Take 1 tablet (5 mg total) by mouth at bedtime. 08/23/14   [place] N Rumley, DO  diclofenac sodium (VOLTAREN) 1 % GEL Apply 2 g topically 2 (two) times daily as needed. 10/06/14   [place] N Rumley, DO  naproxen (NAPROSYN) 500 MG tablet Take 1 tablet (500 mg total) by mouth 2 (two) times daily with a meal. 10/06/14   [place] N Rumley, DO  nicotine (NICODERM CQ - DOSED IN MG/24 HR) 7 mg/24hr patch Place 1 patch (7 mg total) onto the skin daily. 08/23/14   [place] N Rumley, DO  OVER THE COUNTER MEDICATION [employer]- over the counter medicine    Historical Provider, MD  promethazine (PHENERGAN) 25 MG tablet  Take 0.5 tablets (12.5 mg total) by mouth every 6 (six) hours as needed for nausea. 01/21/14 01/28/14  [place] N Rumley, DO  QUEtiapine (SEROQUEL) 100 MG tablet Take 3 tablets (300 mg total) by mouth at bedtime. Start: 50mg  PO qhs x1, then 100 mg PO qhsx1, then 200mg  PO qhs, then 300 mg PO qhs for remainder of prescription 02/08/12   Renee A Kuneff, DO  Simethicone (GAS-X PO) Take by mouth.    Historical Provider, MD  traMADol (ULTRAM) 50 MG tablet Take 1 tablet (50 mg total) by mouth every 8 (eight) hours as needed. 10/15/13   Dayarmys [place] de Gwendalyn Ege, MD   Triage Vitals: BP 143/94 mmHg  Pulse 90  Temp(Src) 98.1 F (36.7 C) (Oral)  Resp 18  Ht 5\' 5"  (1.651 m)  SpO2 100%  Physical Exam  Constitutional: She is oriented to person, place, and time. She appears  well-developed and well-nourished. No distress.  HENT:  Head: Normocephalic and atraumatic.  Eyes: Conjunctivae and EOM are normal.  Neck: Neck supple. No tracheal deviation present.  Cardiovascular: Normal rate.   Pulmonary/Chest: Effort normal. No respiratory distress. She exhibits no tenderness.  Musculoskeletal: Normal range of motion.  Bilateral lower back pain w/o swelling. Tenderness extends to bilateral abdominal walls. Ambulatory w/o imbalance or difficulty Right shoulder: limited ROM secondary to pain, greatest on abduction. No point tenderness to shoulder. Distal pulses intact. No bony deformity.  Neurological: She is alert and oriented to person, place, and time.  Skin: Skin is warm and dry.  Psychiatric: She has a normal mood and affect. Her behavior is normal.  Nursing note and vitals reviewed.   ED Course  Procedures (including critical care time)  DIAGNOSTIC STUDIES: Oxygen Saturation is 100% on RA, normal by my interpretation.    COORDINATION OF CARE: 11:38 PM- Will order Percocet, x-ray of right shoulder, UA, and I-stat chem 8. Pt advised of plan for treatment and pt agrees.  Dg Shoulder Right  10/26/2014   CLINICAL DATA:  Persistent bilateral shoulder pain and back pain, after fall. Initial encounter.  EXAM: RIGHT SHOULDER - 2+ VIEW  COMPARISON:  None.  FINDINGS: There is no evidence of fracture or dislocation. The right humeral head is seated within the glenoid fossa. Anterior osteophyte formation is noted at the humeral head. Mild degenerative change is noted at the right acromioclavicular joint. No significant soft tissue abnormalities are seen. The visualized portions of the right lung are clear.  IMPRESSION: No evidence of fracture or dislocation.   Electronically Signed   By: Garald Balding M.D.   On: 10/26/2014 00:48     Labs Review Labs Reviewed - No data to display  Imaging Review No results found.   EKG Interpretation None      MDM   Final  diagnoses:  None    1. Bilateral low back pain 2. Right shoulder strain  Pain of complaint felt to be muscular in nature - no evidence to support neurologic or bony abnormalities. Will treat with pain medications and encourage PCP follow up if pain persists.   I personally performed the services described in this documentation, which was scribed in my presence. The recorded information has been reviewed and is accurate.      Charlann Lange, PA-C 10/29/14 [id number]  Noemi Chapel, MD 10/29/14 [phone]

## 2014-10-25 NOTE — ED Notes (Addendum)
Pt presents with c/o bilateral shoulder pain and back pain. Pt reports she fell approx one month ago and her shoulders have been hurting since then. Pt also reports back pain that started approx 8 days ago, no injury. 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

## 2014-10-26 LAB — I-STAT CHEM 8, ED
BUN: 20 mg/dL (ref 6–20)
Calcium, Ion: 1.08 mmol/L — ABNORMAL LOW (ref 1.12–1.23)
Chloride: 104 mmol/L (ref 101–111)
Creatinine, Ser: 0.9 mg/dL (ref 0.44–1.00)
Glucose, Bld: 140 mg/dL — ABNORMAL HIGH (ref 65–99)
HCT: 43 % (ref 36.0–46.0)
Hemoglobin: 14.6 g/dL (ref 12.0–15.0)
Potassium: 3.9 mmol/L (ref 3.5–5.1)
Sodium: 139 mmol/L (ref 135–145)
TCO2: 22 mmol/L (ref 0–100)

## 2014-10-26 LAB — URINALYSIS, ROUTINE W REFLEX MICROSCOPIC
Bilirubin Urine: NEGATIVE
Glucose, UA: NEGATIVE mg/dL
Hgb urine dipstick: NEGATIVE
Ketones, ur: NEGATIVE mg/dL
Leukocytes, UA: NEGATIVE
Nitrite: NEGATIVE
Protein, ur: NEGATIVE mg/dL
Specific Gravity, Urine: 1.034 — ABNORMAL HIGH (ref 1.005–1.030)
Urobilinogen, UA: 0.2 mg/dL (ref 0.0–1.0)
pH: 6 (ref 5.0–8.0)

## 2014-10-26 MED ORDER — LIDOCAINE 5 % EX PTCH: 1.0000 | MEDICATED_PATCH | CUTANEOUS | Status: DC

## 2014-10-26 MED ORDER — OXYCODONE-ACETAMINOPHEN 5-325 MG PO TABS
1.0000 | ORAL_TABLET | Freq: Once | ORAL | Status: AC
  Administered 2014-10-26: 1 via ORAL
  Filled 2014-10-26: qty 1

## 2014-10-26 MED ORDER — OXYCODONE-ACETAMINOPHEN 5-325 MG PO TABS: 1.0000 | ORAL_TABLET | ORAL | Status: DC | PRN

## 2014-10-26 MED ORDER — CYCLOBENZAPRINE HCL 10 MG PO TABS: 10.0000 mg | ORAL_TABLET | Freq: Two times a day (BID) | ORAL | Status: DC | PRN

## 2014-10-26 MED ORDER — LIDOCAINE 5 % EX PTCH
1.0000 | MEDICATED_PATCH | CUTANEOUS | Status: DC
  Administered 2014-10-26: 1 via TRANSDERMAL
  Filled 2014-10-26: qty 1

## 2014-10-26 NOTE — ED Notes (Signed)
Pt states her back was not xrayed.  Nehemiah Settle EDPA made aware and states that it didn't need to be xrayed.  Pt notified.

## 2014-10-26 NOTE — Discharge Instructions (Signed)
Back Pain, Adult °Low back pain is very common. About 1 in 5 people have back pain. The cause of low back pain is rarely dangerous. The pain often gets better over time. About half of people with a sudden onset of back pain feel better in just 2 weeks. About 8 in 10 people feel better by 6 weeks.  °CAUSES °Some common causes of back pain include: °· Strain of the muscles or ligaments supporting the spine. °· Wear and tear (degeneration) of the spinal discs. °· Arthritis. °· Direct injury to the back. °DIAGNOSIS °Most of the time, the direct cause of low back pain is not known. However, back pain can be treated effectively even when the exact cause of the pain is unknown. Answering your caregiver's questions about your overall health and symptoms is one of the most accurate ways to make sure the cause of your pain is not dangerous. If your caregiver needs more information, he or she may order lab work or imaging tests (X-rays or MRIs). However, even if imaging tests show changes in your back, this usually does not require surgery. °HOME CARE INSTRUCTIONS °For many people, back pain returns. Since low back pain is rarely dangerous, it is often a condition that people can learn to manage on their own.  °· Remain active. It is stressful on the back to sit or stand in one place. Do not sit, drive, or stand in one place for more than 30 minutes at a time. Take short walks on level surfaces as soon as pain allows. Try to increase the length of time you walk each day. °· Do not stay in bed. Resting more than 1 or 2 days can delay your recovery. °· Do not avoid exercise or work. Your body is made to move. It is not dangerous to be active, even though your back may hurt. Your back will likely heal faster if you return to being active before your pain is gone. °· Pay attention to your body when you  bend and lift. Many people have less discomfort when lifting if they bend their knees, keep the load close to their bodies, and  avoid twisting. Often, the most comfortable positions are those that put less stress on your recovering back. °· Find a comfortable position to sleep. Use a firm mattress and lie on your side with your knees slightly bent. If you lie on your back, put a pillow under your knees. °· Only take over-the-counter or prescription medicines as directed by your caregiver. Over-the-counter medicines to reduce pain and inflammation are often the most helpful. Your caregiver may prescribe muscle relaxant drugs. These medicines help dull your pain so you can more quickly return to your normal activities and healthy exercise. °· Put ice on the injured area. °¨ Put ice in a plastic bag. °¨ Place a towel between your skin and the bag. °¨ Leave the ice on for 15-20 minutes, 03-04 times a day for the first 2 to 3 days. After that, ice and heat may be alternated to reduce pain and spasms. °· Ask your caregiver about trying back exercises and gentle massage. This may be of some benefit. °· Avoid feeling anxious or stressed. Stress increases muscle tension and can worsen back pain. It is important to recognize when you are anxious or stressed and learn ways to manage it. Exercise is a great option. °SEEK MEDICAL CARE IF: °· You have pain that is not relieved with rest or medicine. °· You have pain that does not improve in 1 week. °· You have new symptoms. °· You are generally not feeling well. °SEEK   IMMEDIATE MEDICAL CARE IF:   You have pain that radiates from your back into your legs.  You develop new bowel or bladder control problems.  You have unusual weakness or numbness in your arms or legs.  You develop nausea or vomiting.  You develop abdominal pain.  You feel faint. Document Released: 05/28/2005 Document Revised: 11/27/2011 Document Reviewed: 09/29/2013 [facility] Patient Information 2015 [place], [place]. This information is not intended to replace advice given to you by your health care provider. Make sure you  discuss any questions you have with your health care provider. Cryotherapy Cryotherapy means treatment with cold. Ice or gel packs can be used to reduce both pain and swelling. Ice is the most helpful within the first 24 to 48 hours after an injury or flare-up from overusing a muscle or joint. Sprains, strains, spasms, burning pain, shooting pain, and aches can all be eased with ice. Ice can also be used when recovering from surgery. Ice is effective, has very few side effects, and is safe for most people to use. PRECAUTIONS  Ice is not a safe treatment option for people with:  Raynaud phenomenon. This is a condition affecting small blood vessels in the extremities. Exposure to cold may cause your problems to return.  Cold hypersensitivity. There are many forms of cold hypersensitivity, including:  Cold urticaria. Red, itchy hives appear on the skin when the tissues begin to warm after being iced.  Cold erythema. This is a red, itchy rash caused by exposure to cold.  Cold hemoglobinuria. Red blood cells break down when the tissues begin to warm after being iced. The hemoglobin that carry oxygen are passed into the urine because they cannot combine with blood proteins fast enough.  Numbness or altered sensitivity in the area being iced. If you have any of the following conditions, do not use ice until you have discussed cryotherapy with your caregiver:  Heart conditions, such as arrhythmia, angina, or chronic heart disease.  High blood pressure.  Healing wounds or open skin in the area being iced.  Current infections.  Rheumatoid arthritis.  Poor circulation.  Diabetes. Ice slows the blood flow in the region it is applied. This is beneficial when trying to stop inflamed tissues from spreading irritating chemicals to surrounding tissues. However, if you expose your skin to cold temperatures for too long or without the proper protection, you can damage your skin or nerves. Watch for  signs of skin damage due to cold. HOME CARE INSTRUCTIONS Follow these tips to use ice and cold packs safely.  Place a dry or damp towel between the ice and skin. A damp towel will cool the skin more quickly, so you may need to shorten the time that the ice is used.  For a more rapid response, add gentle compression to the ice.  Ice for no more than 10 to 20 minutes at a time. The bonier the area you are icing, the less time it will take to get the benefits of ice.  Check your skin after 5 minutes to make sure there are no signs of a poor response to cold or skin damage.  Rest 20 minutes or more between uses.  Once your skin is numb, you can end your treatment. You can test numbness by very lightly touching your skin. The touch should be so light that you do not see the skin dimple from the pressure of your fingertip. When using ice, most people will feel these normal sensations in this order: cold,  burning, aching, and numbness.  Do not use ice on someone who cannot communicate their responses to pain, such as small children or people with dementia. HOW TO MAKE AN ICE PACK Ice packs are the most common way to use ice therapy. Other methods include ice massage, ice baths, and cryosprays. Muscle creams that cause a cold, tingly feeling do not offer the same benefits that ice offers and should not be used as a substitute unless recommended by your caregiver. To make an ice pack, do one of the following:  Place crushed ice or a bag of frozen vegetables in a sealable plastic bag. Squeeze out the excess air. Place this bag inside another plastic bag. Slide the bag into a pillowcase or place a damp towel between your skin and the bag.  Mix 3 parts water with 1 part rubbing alcohol. Freeze the mixture in a sealable plastic bag. When you remove the mixture from the freezer, it will be slushy. Squeeze out the excess air. Place this bag inside another plastic bag. Slide the bag into a pillowcase or place  a damp towel between your skin and the bag. SEEK MEDICAL CARE IF:  You develop white spots on your skin. This may give the skin a blotchy (mottled) appearance.  Your skin turns blue or pale.  Your skin becomes waxy or hard.  Your swelling gets worse. MAKE SURE YOU:   Understand these instructions.  Will watch your condition.  Will get help right away if you are not doing well or get worse. Document Released: 01/22/2011 Document Revised: 10/12/2013 Document Reviewed: 01/22/2011 [facility] Patient Information 2015 [place], [place]. This information is not intended to replace advice given to you by your health care provider. Make sure you discuss any questions you have with your health care provider. Shoulder Pain The shoulder is the joint that connects your arms to your body. The bones that form the shoulder joint include the upper arm bone (humerus), the shoulder blade (scapula), and the collarbone (clavicle). The top of the humerus is shaped like a ball and fits into a rather flat socket on the scapula (glenoid cavity). A combination of muscles and strong, fibrous tissues that connect muscles to bones (tendons) support your shoulder joint and hold the ball in the socket. Small, fluid-filled sacs (bursae) are located in different areas of the joint. They act as cushions between the bones and the overlying soft tissues and help reduce friction between the gliding tendons and the bone as you move your arm. Your shoulder joint allows a wide range of motion in your arm. This range of motion allows you to do things like scratch your back or throw a ball. However, this range of motion also makes your shoulder more prone to pain from overuse and injury. Causes of shoulder pain can originate from both injury and overuse and usually can be grouped in the following four categories:  Redness, swelling, and pain (inflammation) of the tendon (tendinitis) or the bursae (bursitis).  Instability, such as a  dislocation of the joint.  Inflammation of the joint (arthritis).  Broken bone (fracture). HOME CARE INSTRUCTIONS   Apply ice to the sore area.  Put ice in a plastic bag.  Place a towel between your skin and the bag.  Leave the ice on for 15-20 minutes, 3-4 times per day for the first 2 days, or as directed by your health care provider.  Stop using cold packs if they do not help with the pain.  If you  have a shoulder sling or immobilizer, wear it as long as your caregiver instructs. Only remove it to shower or bathe. Move your arm as little as possible, but keep your hand moving to prevent swelling.  Squeeze a soft ball or foam pad as much as possible to help prevent swelling.  Only take over-the-counter or prescription medicines for pain, discomfort, or fever as directed by your caregiver. SEEK MEDICAL CARE IF:   Your shoulder pain increases, or new pain develops in your arm, hand, or fingers.  Your hand or fingers become cold and numb.  Your pain is not relieved with medicines. SEEK IMMEDIATE MEDICAL CARE IF:   Your arm, hand, or fingers are numb or tingling.  Your arm, hand, or fingers are significantly swollen or turn white or blue. MAKE SURE YOU:   Understand these instructions.  Will watch your condition.  Will get help right away if you are not doing well or get worse. Document Released: 03/07/2005 Document Revised: 10/12/2013 Document Reviewed: 05/12/2011 [facility] Patient Information 2015 [place], [place]. This information is not intended to replace advice given to you by your health care provider. Make sure you discuss any questions you have with your health care provider.

## 2014-11-02 ENCOUNTER — Ambulatory Visit (INDEPENDENT_AMBULATORY_CARE_PROVIDER_SITE_OTHER): Payer: PPO | Admitting: Family Medicine

## 2014-11-02 ENCOUNTER — Encounter: Payer: Self-pay | Admitting: Family Medicine

## 2014-11-02 VITALS — BP 123/83 | HR 89 | Temp 98.3°F | Wt 236.0 lb

## 2014-11-02 DIAGNOSIS — M25511 Pain in right shoulder: Secondary | ICD-10-CM

## 2014-11-02 MED ORDER — LIDOCAINE 5 % EX PTCH: 1.0000 | MEDICATED_PATCH | CUTANEOUS | Status: DC

## 2014-11-02 NOTE — Patient Instructions (Addendum)
Thank you so much for coming to visit me! I have refilled your Lidocaine patches. Please limit the amount of NSAIDs you are using as these can result in GI bleeds--Volaren also includes NSAIDs! Also please remember that Flexeril is not a long-term medication, however you may use it due to your acute back and shoulder pain. I have placed a referral to the [facility] so they can further evaluate your shoulder pain! I will work on a letter and leave it out front for you to pick up over the next several days!  Hope you feel better soon! Dr. Gerlean Ren  Shoulder Pain The shoulder is the joint that connects your arms to your body. The bones that form the shoulder joint include the upper arm bone (humerus), the shoulder blade (scapula), and the collarbone (clavicle). The top of the humerus is shaped like a ball and fits into a rather flat socket on the scapula (glenoid cavity). A combination of muscles and strong, fibrous tissues that connect muscles to bones (tendons) support your shoulder joint and hold the ball in the socket. Small, fluid-filled sacs (bursae) are located in different areas of the joint. They act as cushions between the bones and the overlying soft tissues and help reduce friction between the gliding tendons and the bone as you move your arm. Your shoulder joint allows a wide range of motion in your arm. This range of motion allows you to do things like scratch your back or throw a ball. However, this range of motion also makes your shoulder more prone to pain from overuse and injury. Causes of shoulder pain can originate from both injury and overuse and usually can be grouped in the following four categories:  Redness, swelling, and pain (inflammation) of the tendon (tendinitis) or the bursae (bursitis).  Instability, such as a dislocation of the joint.  Inflammation of the joint (arthritis).  Broken bone (fracture). HOME CARE INSTRUCTIONS   Apply ice to the sore area.  Put  ice in a plastic bag.  Place a towel between your skin and the bag.  Leave the ice on for 15-20 minutes, 3-4 times per day for the first 2 days, or as directed by your health care provider.  Stop using cold packs if they do not help with the pain.  If you have a shoulder sling or immobilizer, wear it as long as your caregiver instructs. Only remove it to shower or bathe. Move your arm as little as possible, but keep your hand moving to prevent swelling.  Squeeze a soft ball or foam pad as much as possible to help prevent swelling.  Only take over-the-counter or prescription medicines for pain, discomfort, or fever as directed by your caregiver. SEEK MEDICAL CARE IF:   Your shoulder pain increases, or new pain develops in your arm, hand, or fingers.  Your hand or fingers become cold and numb.  Your pain is not relieved with medicines. SEEK IMMEDIATE MEDICAL CARE IF:   Your arm, hand, or fingers are numb or tingling.  Your arm, hand, or fingers are significantly swollen or turn white or blue. MAKE SURE YOU:   Understand these instructions.  Will watch your condition.  Will get help right away if you are not doing well or get worse. Document Released: 03/07/2005 Document Revised: 10/12/2013 Document Reviewed: 05/12/2011 [facility] Patient Information 2015 [place], [place]. This information is not intended to replace advice given to you by your health care provider. Make sure you discuss any questions you have  with your health care provider.  

## 2014-11-02 NOTE — Progress Notes (Signed)
Subjective:     Patient ID: Renette Butters, female   DOB: 1961-01-09, 54 y.o.   MRN: [id number]  HPI Mrs. Mckinstry is a 54yo female presenting today for Right Arm Pain and Lower Back Pain. - Pain located in right shoulder and upper arm and in bilateral lower back - Reports history of falling on it 3 months ago. Has noted the moderate pain x1-2 months. 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 - Has trouble brushing her hair. Has to use left arm to left right arm up. - She has tried Ibuprofen 200mg  and a pain reliever that consists of a Tylenol and NSAID combination. Knows a lot of NSAIDs are not good for her. Denies GI bleed. Martin Majestic to ED on 5/16. Chart review shows she complained of bilateral shoulder pain since falling on porch. Xray with no evidence of fracture or dislocation. Diagnosis of right shoulder strain with bilateral low back strain made. States she was given narcotics but she hasn't been taking these. - Reports Lidoderm patch helps but doesn't resolve pain. Only had one patch left, but this helped her sleep through the night. Was given prescription for this but pharmacy told her it had to come from PCP. Would like refill. - Not relieved by Voltaren Gel  Review of Systems  Musculoskeletal: Positive for myalgias, back pain and arthralgias.      Objective:   Physical Exam  Constitutional: She appears well-developed and well-nourished. No distress.  Neck:  Negative Spurlings  Cardiovascular: Normal rate and regular rhythm.  Exam reveals no gallop and no friction rub.   No murmur heard. Pulmonary/Chest: Effort normal. No respiratory distress. She has no wheezes. She has no rales.  Musculoskeletal:  Limited active ROM of right shoulder in flexion, abduction, and internal rotation. Full passive flexion without tenderness. Passive abduction limited due to pain. Strength 5/5 in upper extremity without  decreased sensation. Pain to palpation of origin of biceps. Hawkins limited by pain. Empty Can limited by pain. Pain over PSIS bilaterally. No midline tenderness along spinous processes. No saddle anesthesia noted.      Assessment:     Please refer to Problem List for Assessment.     Plan:     Please refer to Problem List for Plan.

## 2014-11-03 DIAGNOSIS — M75121 Complete rotator cuff tear or rupture of right shoulder, not specified as traumatic: Secondary | ICD-10-CM | POA: Insufficient documentation

## 2014-11-03 DIAGNOSIS — M25511 Pain in right shoulder: Secondary | ICD-10-CM | POA: Insufficient documentation

## 2014-11-03 NOTE — Assessment & Plan Note (Signed)
-   Multiple etiologies possible given exam. Tenderness over biceps origin with possible biceps tendonitis. Significantly limited passive and active ROM with possible adhesive capsulitis component. Acute trauma with limited ROM with possible rotator cuff injury.  - Discussed limiting use of NSAIDs to use as prescribed. Discussed adverse effects of NSAIDs.  - Discussed that Flexeril is not a long-term medication, but agree with current use given acute musculoskeletal pain. Do not plan on refilling this. - Refill of Lidoderm patches since these seemed to help pain - Referral to Sports Medicine given acute injury after two injuries--fall 3 months ago and twisting of arm by patient 1 week ago

## 2014-11-16 ENCOUNTER — Ambulatory Visit (INDEPENDENT_AMBULATORY_CARE_PROVIDER_SITE_OTHER): Payer: PPO | Admitting: Sports Medicine

## 2014-11-16 ENCOUNTER — Encounter: Payer: Self-pay | Admitting: Sports Medicine

## 2014-11-16 VITALS — BP 139/68 | Ht 65.0 in | Wt 202.0 lb

## 2014-11-16 DIAGNOSIS — M542 Cervicalgia: Secondary | ICD-10-CM

## 2014-11-16 DIAGNOSIS — M7581 Other shoulder lesions, right shoulder: Secondary | ICD-10-CM | POA: Diagnosis not present

## 2014-11-16 HISTORY — DX: Cervicalgia: M54.2

## 2014-11-16 MED ORDER — METHYLPREDNISOLONE ACETATE 40 MG/ML IJ SUSP: 40.0000 mg | Freq: Once | INTRAMUSCULAR | Status: DC

## 2014-11-16 MED ORDER — METHYLPREDNISOLONE ACETATE 80 MG/ML IJ SUSP
80.0000 mg | Freq: Once | INTRAMUSCULAR | Status: AC
  Administered 2014-11-16: 80 mg via INTRA_ARTICULAR

## 2014-11-16 NOTE — Assessment & Plan Note (Signed)
Related to injury at work 3 weeks ago, not part of [place] claim currently -Rx PT -Continue current medications -Plan follow-up 4 weeks or sooner prn

## 2014-11-16 NOTE — Assessment & Plan Note (Addendum)
Injury at work 3 weeks ago, not currently WC claim -CST injection subacromially today -Referred to PT for shoulder and neck exercises -Patient considering WC filing. -Patient wishes to continue regular duty -Plan follow-up in 4 weeks. If persists, consider [place] +/- MRI

## 2014-11-16 NOTE — Progress Notes (Signed)
Subjective:    Patient ID: Alyssa Montgomery, female    DOB: 08-11-1960, 54 y.o.   MRN: [id number]  HPI Alyssa Montgomery is a 54 year old right-hand-dominant female who presents for evaluation of right shoulder pain. She had an initial injury 3 months ago when she fell on the right shoulder. 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 This is not currently part of a Workmen's Compensation claim, but she is thinking of filing it. She noticed immediate pain after the twisting incident located in the superior lateral right shoulder. Symptoms are aggravated with reaching behind her or above her head. Location of pain is presently a the superior lateral shoulder she feels some radiation of pain into the right neck and in the right upper arm. She denies any pain past the elbow. Symptoms are a sharp intermittent stabbing quality. She has tried anti-inflammatory's and topical medication with little relief. She has no prior history of shoulder injury before this. She denies any associated numbness or tingling. Her activity is somewhat limited her job, but she has been continuing to work.  Past medical history, social history, medications, and allergies were reviewed and are up to date in the chart.  Review of Systems 7 point review of systems was performed and was otherwise negative unless noted in the history of present illness.     Objective:   Physical Exam BP 139/68 mmHg  Ht 5\' 5"  (1.651 m)  Wt 202 lb (91.627 kg)  BMI 33.61 kg/m2 GEN: The patient is well-developed well-nourished female and in no acute distress.  She is awake alert and oriented x3. SKIN: warm and well-perfused, no rash  Neuro: Strength 5/5 globally. Sensation intact throughout. DTRs 2/4 bilaterally. No focal deficits. Vasc: +2 bilateral distal pulses. No edema.  MSK: Atrophy: none   Cervical ROM Full, though mild right sided neck pain and spasm is palpable with rotation.  Negative Spurling's.  Shoulder ROM: Right <---> Left   Forward flexion 100<--->180 ER at side 60<--->60 Abd ER 90 with pain at the endpoint<--->90 Abd IR 60<--->60 IR up back L1<--->T6  TTP:  AC joint: Mild  Supraspinatus insertion: +  Subsca/biceps:  -  Periscapular:  -  Trap:  -  Cuff: Impingement/cuff: Hawkins +   Jobes: +   FF strength:5/5   ER strength:5/5   Abdominal compression test:- Lag signs:None  Laxity/instabilty:  -  Yergason:  -           Speeds:  -    Crank:  -                     Active Compression: -  Neurovascular: Normal sensation to light touch in median ulnar and radial nerve distribution with good strength in hand intrinsics grip and EPL, 2+ radial pulse.  X-rays Right Shoulder 10/25/14: Views were obtained at the emergency department of the right shoulder. No fracture or dislocation seen. Mild inferior glenoid fossa degenerative change. Mild acromioclavicular joint arthritis.  Right Subacromial Injection Procedure: The procedure was explained to the patient in detail, all questions were answered.  After obtaining informed verbal consent, the right shoulder was sterilely prepped with alcohol. Using strict sterile technique, a 25 gauge, 1  inch needle was inserted inferior to the anterolateral edge of the right acromion.  The needle is directed medially and slightly anteriorly.  A mixture of 3 mL 1% Xylocaine, 3 mL 1/2% Marcaine, and 80mg  Depo  Medrol was injected without resistance. The needle was removed, and a sterile bandage was applied. The patient tolerated the procedure well and was observed for 5-10 minutes.  Post injection instructions, including signs and symptoms of complications were discussed.     Assessment & Plan:  Please see problem based assessment and plan in the problem list.

## 2014-12-10 ENCOUNTER — Telehealth: Payer: Self-pay | Admitting: Family Medicine

## 2014-12-10 ENCOUNTER — Telehealth: Payer: Self-pay | Admitting: *Deleted

## 2014-12-10 MED ORDER — TRAMADOL HCL 50 MG PO TABS: 50.0000 mg | ORAL_TABLET | Freq: Two times a day (BID) | ORAL | Status: DC | PRN

## 2014-12-10 MED ORDER — DICLOFENAC SODIUM 1 % TD GEL: 2.0000 g | Freq: Two times a day (BID) | TRANSDERMAL | Status: DC | PRN

## 2014-12-10 NOTE — Telephone Encounter (Signed)
Pt need a printout of all meds she's taking and a copy of the last office visit notes.  Want to pick up next Tuesday.

## 2014-12-10 NOTE — Telephone Encounter (Signed)
Pt called about shoulder pain, will refill tramadol and voltaren gel. Has appt next Wed with Dr. Micheline Chapman

## 2014-12-14 NOTE — Telephone Encounter (Signed)
Contacted pt and informed her that the below information would be up front for her to pick up at her convenience. Alyssa Montgomery, Alyssa Montgomery, [place]

## 2014-12-15 ENCOUNTER — Ambulatory Visit (INDEPENDENT_AMBULATORY_CARE_PROVIDER_SITE_OTHER): Payer: PPO | Admitting: Sports Medicine

## 2014-12-15 ENCOUNTER — Encounter: Payer: Self-pay | Admitting: Sports Medicine

## 2014-12-15 VITALS — BP 127/86 | HR 75 | Ht 65.0 in | Wt 202.0 lb

## 2014-12-15 DIAGNOSIS — M25511 Pain in right shoulder: Secondary | ICD-10-CM | POA: Diagnosis not present

## 2014-12-15 MED ORDER — TRAMADOL HCL 50 MG PO TABS: ORAL_TABLET | ORAL | Status: DC

## 2014-12-15 MED ORDER — DICLOFENAC SODIUM 1 % TD GEL: 2.0000 g | Freq: Two times a day (BID) | TRANSDERMAL | Status: DC | PRN

## 2014-12-16 NOTE — Progress Notes (Signed)
   Subjective:    Patient ID: Alyssa Montgomery, female    DOB: 12-29-1960, 54 y.o.   MRN: [id number]  HPI  Patient comes in today complaining of persistent right shoulder pain. She last saw Dr.Pick-Jacobs on June 7. Shoulder pain began after a fall several weeks ago. X-rays done in May showed no obvious fracture or dislocation. Dr. Linnell Fulling administered a subacromial cortisone injection back in June which did provide about 2 weeks worth of symptom relief but her pain has now returned. She localizes the pain to the anterior lateral aspect of the shoulder. Worse with shoulder related activities such as reaching away from her body or reaching overhead. 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 No numbness or tingling. She's beginning to experience some mild pain in the left shoulder as well which is likely compensatory. She does get some pain relief with 100 mg of tramadol and Voltaren gel. She needs a refill on both of these. She is right-hand dominant.    Review of Systems     Objective:   Physical Exam Well-developed, well-nourished. No acute distress  Right shoulder: Patient has limited forward flexion and abduction actively to 90. Passively I'm able to carry her to near full overhead motion. Positive drop arm. No tenderness to palpation along the clavicle or ac joint. Patient has 4/5 strength with resisted supraspinatus and external rotation. Strength with resisted internal rotation. Pain with O'Briens. Neurovascular intact distally.  Left shoulder: Full active range of motion with a slightly positive painful arc. Good rotator cuff strength. No tenderness to palpation. Neurovascularly intact distally.       Assessment & Plan:  Persistent right shoulder pain worrisome for rotator cuff tear  Patient's injury is now 49 months old. She continues to have pain despite recent subacromial cortisone injection, anti-inflammatory, and tramadol. I'm going to proceed with an MRI  specifically to rule out a full-thickness rotator cuff tear which may need operative intervention. Patient is requesting a cortisone injection but I've explained to her that if she has a full-thickness tear a repeat injection will not benefit her. I have given her a sling to wear for comfort but I've encouraged her to use it sparingly. I do not want her to develop a frozen shoulder. I have also refilled her Voltaren gel and tramadol and I will call her with the results of the MRI once available at which point we will delineate further treatment.

## 2014-12-29 ENCOUNTER — Ambulatory Visit
Admission: RE | Admit: 2014-12-29 | Discharge: 2014-12-29 | Disposition: A | Discharge status: Home / Self Care | Payer: PPO | Source: Ambulatory Visit | Attending: Sports Medicine | Admitting: Sports Medicine

## 2014-12-29 DIAGNOSIS — M25511 Pain in right shoulder: Secondary | ICD-10-CM

## 2014-12-30 ENCOUNTER — Telehealth: Payer: Self-pay | Admitting: Sports Medicine

## 2014-12-30 NOTE — Telephone Encounter (Signed)
Dr Domingo Sep [facility] Monday July 25th at 245pm [address] [place]. Alyssa Montgomery

## 2014-12-30 NOTE — Telephone Encounter (Signed)
I spoke with the patient on the phone today after reviewing the MRI of her right shoulder. Patient does have a 1 cm full-thickness partial width tear of the anterior and far lateral supraspinatus tendon. There is retraction of about 1 cm. Although this is a small tear it is definitely full-thickness. Therefore, I would like to refer the patient to Dr. Mardelle Matte for his input. I would defer further treatment of this shoulder injury to Dr. Luanna Cole discretion. Patient will follow-up with me as needed.

## 2015-03-02 ENCOUNTER — Telehealth: Payer: Self-pay | Admitting: Family Medicine

## 2015-03-02 NOTE — Telephone Encounter (Signed)
Pt called and would like a refill on her Flexeril and also to speak to the doctor. Her brother in law passed and is having a difficult time. jw

## 2015-03-09 NOTE — Telephone Encounter (Signed)
Contacted Alyssa Montgomery. Requested refill of Flexeril for muscle spasm. Discussed that this is not normally a medication to be prescribed or refilled on the phone without further evaluation. Last evaluation in clinic 10/2014. Discussed that she should be seen and evaluated by a physician before medication will be considered. Discussed that Flexeril is not a long-term medication and was only used for acute muscle spasm in May. States she is in [place] and unable to come to clinic. Recommended evaluation at Urgent Care. No further concerns at this time.

## 2015-03-30 ENCOUNTER — Telehealth: Payer: Self-pay | Admitting: Internal Medicine

## 2015-03-30 NOTE — Telephone Encounter (Signed)
Pt's Mother, Ms. Hill, established care with you today and Mrs. Holcomb is wanting to see if you will take her on as a new patient.  Per your request, I am sending you a message to ask you to review her chart.

## 2015-03-31 NOTE — Telephone Encounter (Signed)
Ok with me, but please let pt know that I do not treat chronic pain with narcotics such as vicodin or percocet, thanks

## 2015-04-01 NOTE — Telephone Encounter (Signed)
Got scheduled  °

## 2015-04-13 ENCOUNTER — Ambulatory Visit: Payer: PPO | Admitting: Internal Medicine

## 2015-04-15 ENCOUNTER — Ambulatory Visit (INDEPENDENT_AMBULATORY_CARE_PROVIDER_SITE_OTHER): Payer: Self-pay | Admitting: Family Medicine

## 2015-04-15 VITALS — BP 150/102 | HR 77 | Temp 97.9°F | Wt 239.1 lb

## 2015-04-15 DIAGNOSIS — Z0000 Encounter for general adult medical examination without abnormal findings: Secondary | ICD-10-CM

## 2015-04-15 DIAGNOSIS — R739 Hyperglycemia, unspecified: Secondary | ICD-10-CM

## 2015-04-15 DIAGNOSIS — E119 Type 2 diabetes mellitus without complications: Secondary | ICD-10-CM

## 2015-04-15 DIAGNOSIS — L0291 Cutaneous abscess, unspecified: Secondary | ICD-10-CM

## 2015-04-15 DIAGNOSIS — F17200 Nicotine dependence, unspecified, uncomplicated: Secondary | ICD-10-CM

## 2015-04-15 DIAGNOSIS — Z23 Encounter for immunization: Secondary | ICD-10-CM

## 2015-04-15 DIAGNOSIS — Z720 Tobacco use: Secondary | ICD-10-CM

## 2015-04-15 DIAGNOSIS — I10 Essential (primary) hypertension: Secondary | ICD-10-CM

## 2015-04-15 DIAGNOSIS — R400 Somnolence: Secondary | ICD-10-CM

## 2015-04-15 DIAGNOSIS — Z1159 Encounter for screening for other viral diseases: Secondary | ICD-10-CM

## 2015-04-15 LAB — POCT GLYCOSYLATED HEMOGLOBIN (HGB A1C): Hemoglobin A1C: 6.8

## 2015-04-15 MED ORDER — DOXYCYCLINE HYCLATE 100 MG PO TABS
100.0000 mg | ORAL_TABLET | Freq: Two times a day (BID) | ORAL | Status: DC
Start: 2015-04-15 — End: 2015-12-01

## 2015-04-15 MED ORDER — CYCLOBENZAPRINE HCL 10 MG PO TABS: 10.0000 mg | ORAL_TABLET | Freq: Two times a day (BID) | ORAL | Status: DC | PRN

## 2015-04-15 MED ORDER — ALBUTEROL SULFATE HFA 108 (90 BASE) MCG/ACT IN AERS: 2.0000 | INHALATION_SPRAY | Freq: Four times a day (QID) | RESPIRATORY_TRACT | Status: DC | PRN

## 2015-04-15 NOTE — Patient Instructions (Signed)
Thank you so much for coming to visit today! We will check you for hepatitis C and diabetes today to see if they can explain your symptoms. I will also refer you for a sleep study. Please buy drawing salve over the counter to see if that will help with your abscess. I will also give you an antibiotic to help. Please continue to work on smoking cessation.  Please follow up with Orthopedics for your joint pain and surgery.  Thanks again! Dr. Gerlean Ren  Smoking Cessation, Tips for Success If you are ready to quit smoking, congratulations! You have chosen to help yourself be healthier. Cigarettes bring nicotine, tar, carbon monoxide, and other irritants into your body. Your lungs, heart, and blood vessels will be able to work better without these poisons. There are many different ways to quit smoking. Nicotine gum, nicotine patches, a nicotine inhaler, or nicotine nasal spray can help with physical craving. Hypnosis, support groups, and medicines help break the habit of smoking. WHAT THINGS CAN I DO TO MAKE QUITTING EASIER?  Here are some tips to help you quit for good:  Pick a date when you will quit smoking completely. Tell all of your friends and family about your plan to quit on that date.  Do not try to slowly cut down on the number of cigarettes you are smoking. Pick a quit date and quit smoking completely starting on that day.  Throw away all cigarettes.   Clean and remove all ashtrays from your home, work, and car.  On a card, write down your reasons for quitting. Carry the card with you and read it when you get the urge to smoke.  Cleanse your body of nicotine. Drink enough water and fluids to keep your urine clear or pale yellow. Do this after quitting to flush the nicotine from your body.  Learn to predict your moods. Do not let a bad situation be your excuse to have a cigarette. Some situations in your life might tempt you into wanting a cigarette.  Never have "just one" cigarette. It  leads to wanting another and another. Remind yourself of your decision to quit.  Change habits associated with smoking. If you smoked while driving or when feeling stressed, try other activities to replace smoking. Stand up when drinking your coffee. Brush your teeth after eating. Sit in a different chair when you read the paper. Avoid alcohol while trying to quit, and try to drink fewer caffeinated beverages. Alcohol and caffeine may urge you to smoke.  Avoid foods and drinks that can trigger a desire to smoke, such as sugary or spicy foods and alcohol.  Ask people who smoke not to smoke around you.  Have something planned to do right after eating or having a cup of coffee. For example, plan to take a walk or exercise.  Try a relaxation exercise to calm you down and decrease your stress. Remember, you may be tense and nervous for the first 2 weeks after you quit, but this will pass.  Find new activities to keep your hands busy. Play with a pen, coin, or rubber band. Doodle or draw things on paper.  Brush your teeth right after eating. This will help cut down on the craving for the taste of tobacco after meals. You can also try mouthwash.   Use oral substitutes in place of cigarettes. Try using lemon drops, carrots, cinnamon sticks, or chewing gum. Keep them handy so they are available when you have the urge to smoke.  When you have the urge to smoke, try deep breathing.  Designate your home as a nonsmoking area.  If you are a heavy smoker, ask your health care provider about a prescription for nicotine chewing gum. It can ease your withdrawal from nicotine.  Reward yourself. Set aside the cigarette money you save and buy yourself something nice.  Look for support from others. Join a support group or smoking cessation program. Ask someone at home or at work to help you with your plan to quit smoking.  Always ask yourself, "Do I need this cigarette or is this just a reflex?" Tell  yourself, "Today, I choose not to smoke," or "I do not want to smoke." You are reminding yourself of your decision to quit.  Do not replace cigarette smoking with electronic cigarettes (commonly called e-cigarettes). The safety of e-cigarettes is unknown, and some may contain harmful chemicals.  If you relapse, do not give up! Plan ahead and think about what you will do the next time you get the urge to smoke. HOW WILL I FEEL WHEN I QUIT SMOKING? You may have symptoms of withdrawal because your body is used to nicotine (the addictive substance in cigarettes). You may crave cigarettes, be irritable, feel very hungry, cough often, get headaches, or have difficulty concentrating. The withdrawal symptoms are only temporary. They are strongest when you first quit but will go away within 10-14 days. When withdrawal symptoms occur, stay in control. Think about your reasons for quitting. Remind yourself that these are signs that your body is healing and getting used to being without cigarettes. Remember that withdrawal symptoms are easier to treat than the major diseases that smoking can cause.  Even after the withdrawal is over, expect periodic urges to smoke. However, these cravings are generally short lived and will go away whether you smoke or not. Do not smoke! WHAT RESOURCES ARE AVAILABLE TO HELP ME QUIT SMOKING? Your health care provider can direct you to community resources or hospitals for support, which may include:  Group support.  Education.  Hypnosis.  Therapy.   This information is not intended to replace advice given to you by your health care provider. Make sure you discuss any questions you have with your health care provider.   Document Released: 02/24/2004 Document Revised: 06/18/2014 Document Reviewed: 11/13/2012 Elsevier Interactive Patient Education [employer].

## 2015-04-16 LAB — HEPATITIS C ANTIBODY: HCV Ab: NEGATIVE

## 2015-04-17 DIAGNOSIS — R7303 Prediabetes: Secondary | ICD-10-CM | POA: Insufficient documentation

## 2015-04-17 DIAGNOSIS — E119 Type 2 diabetes mellitus without complications: Secondary | ICD-10-CM | POA: Insufficient documentation

## 2015-04-17 DIAGNOSIS — F17200 Nicotine dependence, unspecified, uncomplicated: Secondary | ICD-10-CM | POA: Insufficient documentation

## 2015-04-17 DIAGNOSIS — Z0000 Encounter for general adult medical examination without abnormal findings: Secondary | ICD-10-CM

## 2015-04-17 DIAGNOSIS — L0291 Cutaneous abscess, unspecified: Secondary | ICD-10-CM | POA: Insufficient documentation

## 2015-04-17 DIAGNOSIS — Z87891 Personal history of nicotine dependence: Secondary | ICD-10-CM | POA: Insufficient documentation

## 2015-04-17 HISTORY — DX: Encounter for general adult medical examination without abnormal findings: Z00.00

## 2015-04-17 HISTORY — DX: Nicotine dependence, unspecified, uncomplicated: F17.200

## 2015-04-17 NOTE — Assessment & Plan Note (Signed)
-   A1C today of 6.8 - Will contact Mrs. Darden and give option of initiating treatment with Metformin or continuing to work on diet exercise - Follow up in 3 months for recheck A1C

## 2015-04-17 NOTE — Assessment & Plan Note (Signed)
-   Will check Hepatitis C today - Referral for sleep study given symptoms and FTP grade 3 - Up to date on Colonoscopy and Pap Smear. Discussed need for Mammogram - Discussed smoking cessation

## 2015-04-17 NOTE — Progress Notes (Signed)
Subjective:     Patient ID: Alyssa Montgomery, female   DOB: 09/05/1960, 54 y.o.   MRN: [id number]  HPI Mrs. Barth is a 54yo female presenting today to discuss cancer and hepatitis C screening and for boil on inner thigh.  # Screening: - Last colonoscopy in 2011 with next due in 2021 - Last pap smear in 2014 - Last mammogram in 2015 - Last BMP with glucose of 150 in 10/2014 - Currently smokes one pack every two days - Wishes to be tested for Hepatitis C - Notes increased sleepiness and fatigue during the day. Family states they hear her gasping for breath every once in a while at night and she sometimes wakes herself up.  # Abscess: - Very small and located in right medial thigh - Has been present x4 days - Denies fevers, warmth, erythema  Review of Systems Per HPI    Objective:   Physical Exam  Constitutional: She appears well-developed and well-nourished. No distress.  HENT:  FTP Grade 3  Cardiovascular: Normal rate and regular rhythm.  Exam reveals no gallop and no friction rub.   No murmur heard. Pulmonary/Chest: Effort normal. No respiratory distress. She has no wheezes. She has no rales.  Skin:  3cm abscess slightly palpable abscess in right medial thigh without warmth or erythema       Assessment and Plan:     Diabetes ([place]) - A1C today of 6.8 - Will contact Mrs. Moffet and give option of initiating treatment with Metformin or continuing to work on diet exercise - Follow up in 3 months for recheck A1C  Healthcare maintenance - Will check Hepatitis C today - Referral for sleep study given symptoms and FTP grade 3 - Up to date on Colonoscopy and Pap Smear. Discussed need for Mammogram - Discussed smoking cessation  Abscess - Located on right medial thigh - Very small, making I&D difficult - Recommend drawing salve and course of Bactrim  - Continue to monitor. If fails to resolve or increases in size despite treatment, attempt I&D

## 2015-04-17 NOTE — Assessment & Plan Note (Signed)
-   Located on right medial thigh - Very small, making I&D difficult - Recommend drawing salve and course of Bactrim  - Continue to monitor. If fails to resolve or increases in size despite treatment, attempt I&D

## 2015-04-26 ENCOUNTER — Telehealth: Payer: Self-pay | Admitting: Family Medicine

## 2015-04-26 NOTE — Telephone Encounter (Signed)
Would like test results. °

## 2015-04-27 NOTE — Telephone Encounter (Signed)
Test results were normal.

## 2015-04-27 NOTE — Telephone Encounter (Signed)
Pt informed of below. Zimmerman Rumple, Alyssa Montgomery D, CMA  

## 2015-07-18 ENCOUNTER — Telehealth: Payer: Self-pay | Admitting: Family Medicine

## 2015-07-18 DIAGNOSIS — M25511 Pain in right shoulder: Secondary | ICD-10-CM

## 2015-07-18 NOTE — Telephone Encounter (Signed)
Pt called and would like to speak to Dr. Gerlean Ren about filling out some papers for her. Please call jw

## 2015-07-20 NOTE — Telephone Encounter (Signed)
Will forward to MD. Jazmin Hartsell,CMA  

## 2015-07-21 MED ORDER — DICLOFENAC SODIUM 1 % TD GEL: 2.0000 g | Freq: Two times a day (BID) | TRANSDERMAL | Status: DC | PRN

## 2015-07-21 NOTE — Telephone Encounter (Signed)
Requests refill of Voltaren. Refill sent to pharmacy. States she is going to [place] on 2/28 and they will be sending me the results. Wants me to look out for them and contact her to discuss. Will await results and discuss with patients when received.

## 2015-08-10 ENCOUNTER — Emergency Department (INDEPENDENT_AMBULATORY_CARE_PROVIDER_SITE_OTHER)
Admission: EM | Admit: 2015-08-10 | Discharge: 2015-08-10 | Disposition: A | Discharge status: Home / Self Care | Payer: Self-pay | Source: Home / Self Care | Attending: Family Medicine | Admitting: Family Medicine

## 2015-08-10 ENCOUNTER — Encounter (HOSPITAL_COMMUNITY): Payer: Self-pay | Admitting: *Deleted

## 2015-08-10 DIAGNOSIS — N3001 Acute cystitis with hematuria: Secondary | ICD-10-CM

## 2015-08-10 LAB — POCT URINALYSIS DIP (DEVICE)
Bilirubin Urine: NEGATIVE
Glucose, UA: NEGATIVE mg/dL
Ketones, ur: NEGATIVE mg/dL
Nitrite: POSITIVE — AB
Protein, ur: >=300 mg/dL — AB
Specific Gravity, Urine: 1.025 (ref 1.005–1.030)
Urobilinogen, UA: 0.2 mg/dL (ref 0.0–1.0)
pH: 5.5 (ref 5.0–8.0)

## 2015-08-10 MED ORDER — CIPROFLOXACIN HCL 500 MG PO TABS: 500.0000 mg | ORAL_TABLET | Freq: Two times a day (BID) | ORAL | Status: DC

## 2015-08-10 NOTE — ED Notes (Signed)
PT  REPORTS  BACK  PAIN  AS   WELL  AS  CLOUDY  URINE  AND       WHITISH  CLEAR    DISCHARGE            NOTICED  SOME  BLOOD  IN  URINE  TODAY            PT  AMBULATED   WITH A  STEADY  FLUID    GAIT

## 2015-08-10 NOTE — Discharge Instructions (Signed)
Take all of medicine as directed, drink lots of fluids, see your doctor if further problems. °

## 2015-08-10 NOTE — ED Provider Notes (Signed)
CSN: [id number]     Arrival date & time 08/10/15  1611 History   First MD Initiated Contact with Patient 08/10/15 1712     Chief Complaint  Patient presents with  . Urinary Urgency   (Consider location/radiation/quality/duration/timing/severity/associated sxs/prior Treatment) Patient is a 55 y.o. female presenting with abdominal pain. The history is provided by the patient.  Abdominal Pain Pain location:  Suprapubic Pain quality: burning   Pain radiates to:  Does not radiate Pain severity:  Moderate Progression:  Unchanged Chronicity:  New Context comment:  Cloudy urine for 2d and blood seen today. Relieved by:  None tried Worsened by:  Nothing tried Ineffective treatments:  None tried Associated symptoms: dysuria and hematuria   Associated symptoms: no diarrhea, no nausea, no vaginal discharge and no vomiting     Past Medical History  Diagnosis Date  . Diverticulitis   . Fibromyalgia   . Substance abuse     10 years ago ( cocaine)  . Bronchitis   . Anxiety    Past Surgical History  Procedure Laterality Date  . Bowel obstruction    . Back surgery    . Ankle surgery    . Eye surgery      removed right eye;   Marland Kitchen Ectopic pregnancy surgery     Family History  Problem Relation Age of Onset  . Hypertension Mother   . Arthritis Mother   . Cancer Father     lung   Social History  Substance Use Topics  . Smoking status: Current Every Day Smoker -- 0.50 packs/day for 18 years    Types: Cigarettes  . Smokeless tobacco: Never Used     Comment: 1-800 number given   . Alcohol Use: No   OB History    Gravida Para Term Preterm AB TAB SAB Ectopic Multiple Living   6 2 2  4 1 2 1  2      Review of Systems  Constitutional: Negative.   Gastrointestinal: Positive for abdominal pain. Negative for nausea, vomiting and diarrhea.  Genitourinary: Positive for dysuria and hematuria. Negative for vaginal discharge, menstrual problem and pelvic pain.  Musculoskeletal: Negative.     All other systems reviewed and are negative.   Allergies  Penicillins and Sulfonamide derivatives  Home Medications   Prior to Admission medications   Medication Sig Start Date End Date Taking? Authorizing Provider  albuterol (PROVENTIL HFA;VENTOLIN HFA) 108 (90 BASE) MCG/ACT inhaler Inhale 2 puffs into the lungs every 6 (six) hours as needed for wheezing. 04/15/15 04/14/16  [place] N Rumley, DO  ciprofloxacin (CIPRO) 500 MG tablet Take 1 tablet (500 mg total) by mouth 2 (two) times daily. 08/10/15   Billy Fischer, MD  cyclobenzaprine (FLEXERIL) 10 MG tablet Take 1 tablet (10 mg total) by mouth 2 (two) times daily as needed for muscle spasms. 04/15/15   [place] N Rumley, DO  diclofenac sodium (VOLTAREN) 1 % GEL Apply 2 g topically 2 (two) times daily as needed. 07/21/15   [place] N Rumley, DO  doxycycline (VIBRA-TABS) 100 MG tablet Take 1 tablet (100 mg total) by mouth 2 (two) times daily. 04/15/15   [place] N Rum[place], DO  lidocaine (LIDODERM) 5 % Place 1 patch onto the skin daily. Remove & Discard patch within 12 hours or as directed by MD 11/02/14   Lorna Few, DO  naproxen (NAPROSYN) 500 MG tablet Take 1 tablet (500 mg total) by mouth 2 (two) times daily with a meal. 10/06/14   Lorna Few,  DO  nicotine (NICODERM CQ - DOSED IN MG/24 HR) 7 mg/24hr patch Place 1 patch (7 mg total) onto the skin daily. 08/23/14   [place] N Rumley, DO  OVER THE COUNTER MEDICATION [employer]- over the counter medicine    Historical Provider, MD  oxyCODONE-acetaminophen (PERCOCET/ROXICET) 5-325 MG per tablet Take 1-2 tablets by mouth every 4 (four) hours as needed for severe pain. 10/26/14   Charlann Lange, PA-C  promethazine (PHENERGAN) 25 MG tablet Take 0.5 tablets (12.5 mg total) by mouth every 6 (six) hours as needed for nausea. 01/21/14 01/28/14  [place] N Rumley, DO  QUEtiapine (SEROQUEL) 100 MG tablet Take 3 tablets (300 mg total) by mouth at bedtime. Start: 50mg  PO qhs x1, then 100 mg PO qhsx1, then 200mg  PO  qhs, then 300 mg PO qhs for remainder of prescription 02/08/12   Renee A Kuneff, DO  Simethicone (GAS-X PO) Take by mouth.    Historical Provider, MD  traMADol (ULTRAM) 50 MG tablet Take one to two tablets every 8 hours as needed for pain 12/15/14   Thurman Coyer, DO   Meds Ordered and Administered this Visit  Medications - No data to display  BP 152/91 mmHg  Pulse 93  Temp(Src) 98.3 F (36.8 C) (Oral)  Resp 18  SpO2 98% No data found.   Physical Exam  Constitutional: She is oriented to person, place, and time. She appears well-developed and well-nourished. No distress.  Abdominal: Soft. Bowel sounds are normal. She exhibits no distension and no mass. There is tenderness in the suprapubic area. There is no rigidity, no rebound, no guarding and no CVA tenderness.    Neurological: She is alert and oriented to person, place, and time.  Skin: Skin is warm and dry.  Nursing note and vitals reviewed.   ED Course  Procedures (including critical care time)  Labs Review Labs Reviewed  POCT URINALYSIS DIP (DEVICE) - Abnormal; Notable for the following:    Hgb urine dipstick LARGE (*)    Protein, ur >=300 (*)    Nitrite POSITIVE (*)    Leukocytes, UA LARGE (*)    All other components within normal limits   U/a as noted  Imaging Review No results found.   Visual Acuity Review  Right Eye Distance:   Left Eye Distance:   Bilateral Distance:    Right Eye Near:   Left Eye Near:    Bilateral Near:         MDM   1. Acute cystitis with hematuria     Meds ordered this encounter  Medications  . ciprofloxacin (CIPRO) 500 MG tablet    Sig: Take 1 tablet (500 mg total) by mouth 2 (two) times daily.    Dispense:  14 tablet    Refill:  0      Billy Fischer, MD 08/10/15 1758

## 2015-08-24 ENCOUNTER — Ambulatory Visit: Payer: Medicare Other | Admitting: Family Medicine

## 2015-09-07 ENCOUNTER — Ambulatory Visit (INDEPENDENT_AMBULATORY_CARE_PROVIDER_SITE_OTHER): Payer: Self-pay | Admitting: Family Medicine

## 2015-09-07 ENCOUNTER — Encounter: Payer: Self-pay | Admitting: Family Medicine

## 2015-09-07 ENCOUNTER — Other Ambulatory Visit (HOSPITAL_COMMUNITY)
Admission: RE | Admit: 2015-09-07 | Discharge: 2015-09-07 | Disposition: A | Discharge status: Home / Self Care | Payer: Medicare Other | Source: Ambulatory Visit | Attending: Family Medicine | Admitting: Family Medicine

## 2015-09-07 VITALS — BP 126/78 | HR 78 | Temp 97.6°F | Ht 65.0 in | Wt 224.0 lb

## 2015-09-07 DIAGNOSIS — R300 Dysuria: Secondary | ICD-10-CM

## 2015-09-07 DIAGNOSIS — Z113 Encounter for screening for infections with a predominantly sexual mode of transmission: Secondary | ICD-10-CM | POA: Diagnosis present

## 2015-09-07 DIAGNOSIS — R1030 Lower abdominal pain, unspecified: Secondary | ICD-10-CM

## 2015-09-07 DIAGNOSIS — N898 Other specified noninflammatory disorders of vagina: Secondary | ICD-10-CM

## 2015-09-07 LAB — POCT URINALYSIS DIPSTICK
Bilirubin, UA: NEGATIVE
Glucose, UA: NEGATIVE
Leukocytes, UA: NEGATIVE
Nitrite, UA: NEGATIVE
Protein, UA: NEGATIVE
Spec Grav, UA: 1.02
Urobilinogen, UA: 0.2
pH, UA: 5.5

## 2015-09-07 LAB — POCT UA - MICROSCOPIC ONLY

## 2015-09-07 LAB — POCT WET PREP (WET MOUNT): Clue Cells Wet Prep Whiff POC: NEGATIVE

## 2015-09-07 NOTE — Patient Instructions (Signed)
Thank you for coming to see me today. It was a pleasure. Today we talked about:   Abdominal pain: I am getting some tests. I will also send you for an ultrasound. Please follow-up with your primary care physicain  If you have any questions or concerns, please do not hesitate to call the office at [phone].  Sincerely,  Cordelia Poche, MD

## 2015-09-07 NOTE — Progress Notes (Signed)
    Subjective    Alyssa Montgomery is a 55 y.o. female that presents for a follow-up visit for:   1. Lower abdominal pain: Symptoms started 5 or so years ago. She has been seen for this in the past by OB/Gyn and states she was told she had fibroids which improved. She reports that she has had two intraabdominal surgeries for possible bowel obstruction. Pain is intermittent and sometimes increases in intensity. She reports normal, soft bowel movements, no hematochezia. She reports a recent history of melena about one month ago but has been eating a lot of spinach. No fevers. She states she has chronic nausea.   Social History  Substance Use Topics  . Smoking status: Current Every Day Smoker -- 0.50 packs/day for 18 years    Types: Cigarettes  . Smokeless tobacco: Never Used     Comment: 1-800 number given   . Alcohol Use: No    Allergies  Allergen Reactions  . Penicillins Other (See Comments)    unknown  . Sulfonamide Derivatives Itching    No orders of the defined types were placed in this encounter.    ROS  Per HPI   Objective   BP 126/78 mmHg  Pulse 78  Temp(Src) 97.6 F (36.4 C) (Oral)  Ht 5\' 5"  (1.651 m)  Wt 224 lb (101.606 kg)  BMI 37.28 kg/m2  Vital signs reviewed  General: Well appearing, no distress Gastrointestinal: Soft, tenderness in lower abdominal from left to right. No rebound or guarding. Normal bowel sounds.  Genitourinary: Vagina appears normal. Cervical stenosis noted on exam. White discharge present with no bleeding. Could not perform pap smear. Wet prep and GC/Chlamydia swabs obtained. No CMT  Assessment and Plan    Vaginal discharge - POCT Wet Prep [facility]) - Cervicovaginal ancillary only  3. Lower abdominal pain This is a chronic issue. Nothing overt on exam. Possibly related to GU system. Patient without fevers or other associated symptoms. Will obtain an ultrasound in addition to STD check - US Pelvis Complete; Future - US Transvaginal  Non-OB; Future

## 2015-09-09 ENCOUNTER — Encounter: Payer: Self-pay | Admitting: Family Medicine

## 2015-09-09 LAB — CERVICOVAGINAL ANCILLARY ONLY
Chlamydia: NEGATIVE
Neisseria Gonorrhea: NEGATIVE

## 2015-09-21 ENCOUNTER — Ambulatory Visit (HOSPITAL_COMMUNITY)
Admission: RE | Admit: 2015-09-21 | Discharge: 2015-09-21 | Disposition: A | Discharge status: Home / Self Care | Payer: Medicare Other | Source: Ambulatory Visit | Attending: Family Medicine | Admitting: Family Medicine

## 2015-09-21 DIAGNOSIS — D259 Leiomyoma of uterus, unspecified: Secondary | ICD-10-CM | POA: Insufficient documentation

## 2015-09-21 DIAGNOSIS — S0502XA Injury of conjunctiva and corneal abrasion without foreign body, left eye, initial encounter: Secondary | ICD-10-CM | POA: Diagnosis not present

## 2015-09-21 DIAGNOSIS — R1030 Lower abdominal pain, unspecified: Secondary | ICD-10-CM

## 2015-09-26 ENCOUNTER — Telehealth: Payer: Self-pay | Admitting: Family Medicine

## 2015-09-26 DIAGNOSIS — R935 Abnormal findings on diagnostic imaging of other abdominal regions, including retroperitoneum: Secondary | ICD-10-CM

## 2015-09-26 NOTE — Telephone Encounter (Signed)
Physician Telephone Note  Reason for call: Discuss ultrasound results  Discussion with patient: Left voice message for patient to call back. Results from ultrasound show thickened endometrium for patient's post-menopausal state. Also noticed on the exam was a polyp vs fibroid. Sonohysterogram was recommended. Will place order. Patient will likely need follow-up with Gyn clinic for endometrial biopsy after her sonohysterogram.   Cordelia Poche, MD PGY-3, [facility] 09/26/2015, 3:50 PM

## 2015-09-27 ENCOUNTER — Telehealth: Payer: Self-pay | Admitting: Family Medicine

## 2015-09-27 NOTE — Telephone Encounter (Signed)
Patient is in [place] for sona histogram.  They don't perform that procedure there.

## 2015-10-03 NOTE — Telephone Encounter (Signed)
LVM for pt to call. Sameeha Rockefeller T Azul Brumett, CMA  

## 2015-10-04 NOTE — Telephone Encounter (Signed)
Patient given message from provider, states she can only do appointments on wednesdays in the afternoons.

## 2015-10-07 ENCOUNTER — Telehealth: Payer: Self-pay | Admitting: Family Medicine

## 2015-10-07 NOTE — Telephone Encounter (Signed)
Pt was told she needed additional testing to determine if what was seen on ultrasound was a polyp or fibroid.  She is not sure who is suppose to schedule this followup appt.   Due to pt work schedule, she can only have an appt on wed Please advise.  Pt cannot answer the phone at work so please leave a message

## 2015-10-07 NOTE — Telephone Encounter (Signed)
LVM telling pt that [facility] not do Sonohyster[facility]'s on Wed. We made her an appt for Thurs 10/20/2015 at 10 arriving at 945 am.. If she would like to change the appointment she can call [phone]. Ottis Stain, CMA

## 2015-10-11 NOTE — Telephone Encounter (Signed)
Closing this due to being handled in another note. Alyssa Montgomery, Alyssa Montgomery, [place]

## 2015-10-20 ENCOUNTER — Ambulatory Visit (HOSPITAL_COMMUNITY)
Admission: RE | Admit: 2015-10-20 | Discharge: 2015-10-20 | Disposition: A | Discharge status: Home / Self Care | Payer: Medicare Other | Source: Ambulatory Visit | Attending: Family Medicine | Admitting: Family Medicine

## 2015-10-20 DIAGNOSIS — D250 Submucous leiomyoma of uterus: Secondary | ICD-10-CM | POA: Insufficient documentation

## 2015-10-20 DIAGNOSIS — R935 Abnormal findings on diagnostic imaging of other abdominal regions, including retroperitoneum: Secondary | ICD-10-CM

## 2015-10-20 DIAGNOSIS — N840 Polyp of corpus uteri: Secondary | ICD-10-CM | POA: Insufficient documentation

## 2015-10-21 ENCOUNTER — Telehealth: Payer: Self-pay | Admitting: Family Medicine

## 2015-10-21 DIAGNOSIS — N840 Polyp of corpus uteri: Secondary | ICD-10-CM | POA: Insufficient documentation

## 2015-10-21 NOTE — Telephone Encounter (Signed)
Called to discuss patient's ultrasound results. Appears there was a polyp found. Recommendation is biopsy to identify the exact pathology of the polyp. I have placed a referral to Gynecology for management of this issue. Patient can follow-up there.

## 2015-10-26 NOTE — Telephone Encounter (Signed)
Spoke to pt. Informed her of below.  Pt voiced concern about how quick she will get in to the GYN office. Would like apt on a Wed. I told her we don't have control over that but I would pass along her preference. Ottis Stain, CMA

## 2015-11-14 ENCOUNTER — Telehealth: Payer: Self-pay | Admitting: *Deleted

## 2015-11-23 ENCOUNTER — Ambulatory Visit (INDEPENDENT_AMBULATORY_CARE_PROVIDER_SITE_OTHER): Payer: Medicaid Other | Admitting: Gynecology

## 2015-11-23 ENCOUNTER — Encounter: Payer: Self-pay | Admitting: Gynecology

## 2015-11-23 VITALS — BP 132/84 | Ht 63.75 in | Wt 223.0 lb

## 2015-11-23 DIAGNOSIS — N840 Polyp of corpus uteri: Secondary | ICD-10-CM

## 2015-11-23 DIAGNOSIS — R350 Frequency of micturition: Secondary | ICD-10-CM | POA: Diagnosis not present

## 2015-11-23 DIAGNOSIS — Z124 Encounter for screening for malignant neoplasm of cervix: Secondary | ICD-10-CM | POA: Diagnosis not present

## 2015-11-23 DIAGNOSIS — N859 Noninflammatory disorder of uterus, unspecified: Secondary | ICD-10-CM | POA: Diagnosis not present

## 2015-11-23 LAB — URINALYSIS W MICROSCOPIC + REFLEX CULTURE
Bilirubin Urine: NEGATIVE
Crystals: NONE SEEN [HPF]
Glucose, UA: NEGATIVE
Ketones, ur: NEGATIVE
Leukocytes, UA: NEGATIVE
Nitrite: NEGATIVE
Specific Gravity, Urine: 1.025 (ref 1.001–1.035)
pH: 5.5 (ref 5.0–8.0)

## 2015-11-23 NOTE — Patient Instructions (Signed)
Hysteroscopy °Hysteroscopy is a procedure used for looking inside the womb (uterus). It may be done for various reasons, including: °· To evaluate abnormal bleeding, fibroid (benign, noncancerous) tumors, polyps, scar tissue (adhesions), and possibly cancer of the uterus. °· To look for lumps (tumors) and other uterine growths. °· To look for causes of why a woman cannot get pregnant (infertility), causes of recurrent loss of pregnancy (miscarriages), or a lost intrauterine device (IUD). °· To perform a sterilization by blocking the fallopian tubes from inside the uterus. °In this procedure, a thin, flexible tube with a tiny light and camera on the end of it (hysteroscope) is used to look inside the uterus. A hysteroscopy should be done right after a menstrual period to be sure you are not pregnant. °LET YOUR HEALTH CARE PROVIDER KNOW ABOUT:  °· Any allergies you have. °· All medicines you are taking, including vitamins, herbs, eye drops, creams, and over-the-counter medicines. °· Previous problems you or members of your family have had with the use of anesthetics. °· Any blood disorders you have. °· Previous surgeries you have had. °· Medical conditions you have. °RISKS AND COMPLICATIONS  °Generally, this is a safe procedure. However, as with any procedure, complications can occur. Possible complications include: °· Putting a hole in the uterus. °· Excessive bleeding. °· Infection. °· Damage to the cervix. °· Injury to other organs. °· Allergic reaction to medicines. °· Too much fluid used in the uterus for the procedure. °BEFORE THE PROCEDURE  °· Ask your health care provider about changing or stopping any regular medicines. °· Do not take aspirin or blood thinners for 1 week before the procedure, or as directed by your health care provider. These can cause bleeding. °· If you smoke, do not smoke for 2 weeks before the procedure. °· In some cases, a medicine is placed in the cervix the day before the procedure.  This medicine makes the cervix have a larger opening (dilate). This makes it easier for the instrument to be inserted into the uterus during the procedure. °· Do not eat or drink anything for at least 8 hours before the surgery. °· Arrange for someone to take you home after the procedure. °PROCEDURE  °· You may be given a medicine to relax you (sedative). You may also be given one of the following: °¨ A medicine that numbs the area around the cervix (local anesthetic). °¨ A medicine that makes you sleep through the procedure (general anesthetic). °· The hysteroscope is inserted through the vagina into the uterus. The camera on the hysteroscope sends a picture to a TV screen. This gives the surgeon a good view inside the uterus. °· During the procedure, air or a liquid is put into the uterus, which allows the surgeon to see better. °· Sometimes, tissue is gently scraped from inside the uterus. These tissue samples are sent to a lab for testing. °AFTER THE PROCEDURE  °· If you had a general anesthetic, you may be groggy for a couple hours after the procedure. °· If you had a local anesthetic, you will be able to go home as soon as you are stable and feel ready. °· You may have some cramping. This normally lasts for a couple days. °· You may have bleeding, which varies from light spotting for a few days to menstrual-like bleeding for 3-7 days. This is normal. °· If your test results are not back during the visit, make an appointment with your health care provider to find out the   results.   This information is not intended to replace advice given to you by your health care provider. Make sure you discuss any questions you have with your health care provider.   Document Released: 09/03/2000 Document Revised: 03/18/2013 Document Reviewed: 12/25/2012 Elsevier Interactive Patient Education [employer].

## 2015-11-24 ENCOUNTER — Telehealth: Payer: Self-pay

## 2015-11-24 ENCOUNTER — Ambulatory Visit: Payer: Medicare Other | Admitting: Family Medicine

## 2015-11-24 LAB — PAP IG W/ RFLX HPV ASCU

## 2015-11-24 NOTE — Progress Notes (Addendum)
Alyssa Montgomery is an 55 y.o. female as a referral from her primary care physician secondary to complain some mild pelvic pressure for the past 2 years she had previously been followed several years ago at the GYN clinic at [facility] hospital and had had an endometrial biopsy and a D&C and biopsy was benign. Her provider had ordered a sonohysterogram and the following was documented by the radiologist:  Transvaginal and abdominal ultrasound April 12 demonstrated the following: ADDENDUM: After review of the examination with the with the sonographer the structure measuring 1.8 x 1.5 x 1.5 cm more closely reflects and endometrial abnormality such as a polyp. The previously noted posterior fibroid is no longer visualized. Therefore, a focal endometrial lesion is suspected. Consider sonohysterogram for further evaluation, prior to hysteroscopy or endometrial biopsy.   COMPARISON: 09/21/2015/sono hysterogram:  FINDINGS: 2.1 x 1.6 x 1.2 cm focal polypoid endometrial lesion along the uterine body.  Additional 9 x 5 x 7 mm focal polypoid endometrial lesion in the uterine fundus.  No significant vascularity.  Both lesions are mildly echogenic and favor endometrial polyps over submucosal fibroids.  IMPRESSION: Two focal polypoid endometrial lesions measuring up to 2.1 cm, as described above, favored to reflect endometrial polyps over submucosal fibroids.  Patient reports no vaginal bleeding she stated that she recently menopause at the age of 56 has not been any hormonal replacement therapy. Patient states that she had a colonoscopy back in 2011. Interestingly she has had 2 abdominal explorations for what sounds to me like small bowel obstruction when she was in her 81s. She is to have a substance abuse problems for cocaine and has not been using any drugs for several years according to her. She does not recall ever having any abnormal Pap smear although she has had history of chlamydia  and gonorrhea in the past.  Patient is a gravida 6 para 2 (2 normal spontaneous vaginal deliveries, one elective abortion, 2 spontaneous AB and one abortion at 6 months elective?)   Pertinent Gynecological History: Menses: post-menopausal Bleeding: None Contraception: post menopausal status DES exposure: unknown Blood transfusions: ? Sexually transmitted diseases: Chlamydia gonorrhea Previous GYN Procedures:  see above  Last mammogram: normal Date: 2016 Last pap: normal Date: 2014 OB History: G 6, P 2 A 4  Menstrual History: Menarche age: 61 No LMP recorded. Patient is postmenopausal.    Past Medical History  Diagnosis Date  . Diverticulitis   . Fibromyalgia   . Substance abuse     10 years ago ( cocaine)  . Bronchitis   . Anxiety     Past Surgical History  Procedure Laterality Date  . Bowel obstruction    . Back surgery    . Ankle surgery    . Eye surgery      removed right eye;   Marland Kitchen Ectopic pregnancy surgery      Family History  Problem Relation Age of Onset  . Hypertension Mother   . Arthritis Mother   . Cancer Father     lung    Social History:  reports that she has been smoking Cigarettes.  She has a 9 pack-year smoking history. She has never used smokeless tobacco. She reports that she does not drink alcohol or use illicit drugs.  Allergies:  Allergies  Allergen Reactions  . Penicillins Other (See Comments)    unknown  . Sulfonamide Derivatives Itching     (Not in a hospital admission)  REVIEW OF SYSTEMS: A ROS was performed and pertinent  positives and negatives are included in the history.  GENERAL: No fevers or chills. HEENT: No change in vision, no earache, sore throat or sinus congestion. NECK: No pain or stiffness. CARDIOVASCULAR: No chest pain or pressure. No palpitations. PULMONARY: No shortness of breath, cough or wheeze. GASTROINTESTINAL: No abdominal pain, nausea, vomiting or diarrhea, melena or bright red blood per rectum. GENITOURINARY:  No urinary frequency, urgency, hesitancy or dysuria. MUSCULOSKELETAL: No joint or muscle pain, no back pain, no recent trauma. DERMATOLOGIC: No rash, no itching, no lesions. ENDOCRINE: No polyuria, polydipsia, no heat or cold intolerance. No recent change in weight. HEMATOLOGICAL: No anemia or easy bruising or bleeding. NEUROLOGIC: No headache, seizures, numbness, tingling or weakness. PSYCHIATRIC: No depression, no loss of interest in normal activity or change in sleep pattern.     Blood pressure 132/84, height 5' 3.75" (1.619 m), weight 223 lb (101.152 kg).  Physical Exam:  HEENT:unremarkable Neck:Supple, midline, no thyroid megaly, no carotid bruits Lungs:  Clear to auscultation no rhonchi's or wheezes Heart:Regular rate and rhythm, no murmurs or gallops Breast Exam: Not examined Abdomen: Midline scar Pelvic:BUS within normal limits Vagina: No lesions or discharge Cervix: No lesions or discharge Uterus: Anteverted? Adnexa: No palpable masses or tenderness Extremities: No cords, no edema Rectal: Unremarkable  Because of the finding on sonohysterogram recently patient was counseled for an endometrial biopsy. First a Pap smear was obtained. The cervix with this cleansed with Betadine solution a single-tooth tenaculum was placed on the anterior cervical lip and a sterile Pipelle was introduced into the uterine cavity and moderate amount of tissue obtained submitted for histological evaluation. The CO2 tenaculum was removed. Patient tolerated procedure well.  Urinalysis today few bacteria 0-5 WBC, 3-10 RBC culture pending   Assessment/Plan: 55 year old postmenopausal patient with endometrial polyps will be scheduled to undergo resectoscopic polypectomy as an outpatient setting. Endometrial biopsy was done today along with Pap smear result pending at time of this dictation. Risk benefits and pros and cons of the operation discussed with the patient as follows:                         Patient was counseled as to the risk of surgery to include the following:  1. Infection (prohylactic antibiotics will be administered)  2. DVT/Pulmonary Embolism (prophylactic pneumo compression stockings will be used)  3.Trauma to internal organs requiring additional surgical procedure to repair any injury to     Internal organs requiring perhaps additional hospitalization days.  4.Hemmorhage requiring transfusion and blood products which carry risks such as   anaphylactic reaction, hepatitis and AIDS  Patient had received literature information on the procedure scheduled and all her questions were answered and fully accepts all risk.   Hoke Baer HMD9:01 AMTD     Uvaldo Rising H 11/24/2015, 8:52 AM

## 2015-11-24 NOTE — Telephone Encounter (Signed)
I contacted patient to schedule surgery. I scheduled her for 12/27/15.  Dr. Moshe Salisbury indicated no pre-op appt will be needed in office.  Patient asked me about completing FMLA form for her. I explained this procedure usually only requires a day or two out of work. 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  I clarified with her that she just have Medicare ins (I thought she was on disability).   I advised patient $25 fee for initial form and she will need to sign a medical records release form.  Dr. Merrily Brittle to give her week out of work for surgery on her FMLA form ?

## 2015-11-25 LAB — URINE CULTURE: Colony Count: 70000

## 2015-11-25 NOTE — Telephone Encounter (Signed)
Yes

## 2015-11-29 LAB — HUMAN PAPILLOMAVIRUS, HIGH RISK: HPV DNA High Risk: DETECTED — AB

## 2015-12-01 ENCOUNTER — Encounter: Payer: Self-pay | Admitting: Gynecology

## 2015-12-01 ENCOUNTER — Ambulatory Visit (INDEPENDENT_AMBULATORY_CARE_PROVIDER_SITE_OTHER): Payer: Medicaid Other | Admitting: Gynecology

## 2015-12-01 VITALS — BP 128/80 | Ht 63.0 in | Wt 223.0 lb

## 2015-12-01 DIAGNOSIS — Z23 Encounter for immunization: Secondary | ICD-10-CM

## 2015-12-01 DIAGNOSIS — N870 Mild cervical dysplasia: Secondary | ICD-10-CM | POA: Diagnosis not present

## 2015-12-01 DIAGNOSIS — R896 Abnormal cytological findings in specimens from other organs, systems and tissues: Secondary | ICD-10-CM

## 2015-12-01 DIAGNOSIS — IMO0002 Reserved for concepts with insufficient information to code with codable children: Secondary | ICD-10-CM | POA: Insufficient documentation

## 2015-12-01 NOTE — Patient Instructions (Addendum)
Colposcopy Care After Colposcopy is a procedure in which a special tool is used to magnify the surface of the cervix. A tissue sample (biopsy) may also be taken. This sample will be looked at for cervical cancer or other problems. After the test:  You may have some cramping.  Lie down for a few minutes if you feel lightheaded.   You may have some bleeding which should stop in a few days. HOME CARE  Do not have sex or use tampons for 2 to 3 days or as told.  Only take medicine as told by your doctor.  Continue to take your birth control pills as usual. Finding out the results of your test Ask when your test results will be ready. Make sure you get your test results. GET HELP RIGHT AWAY IF:  You are bleeding a lot or are passing blood clots.  You develop a fever of 102 F (38.9 C) or higher.  You have abnormal vaginal discharge.  You have cramps that do not go away with medicine.  You feel lightheaded, dizzy, or pass out (faint). MAKE SURE YOU:   Understand these instructions.  Will watch your condition.  Will get help right away if you are not doing well or get worse.   This information is not intended to replace advice given to you by your health care provider. Make sure you discuss any questions you have with your health care provider.   Document Released: 11/14/2007 Document Revised: 08/20/2011 Document Reviewed: 12/25/2012 Elsevier Interactive Patient Education 2016 [employer]. Tdap Vaccine (Tetanus, Diphtheria and Pertussis): What You Need to Know 1. Why get vaccinated? Tetanus, diphtheria and pertussis are very serious diseases. Tdap vaccine can protect [place] from these diseases. And, Tdap vaccine given to pregnant women can protect newborn babies against pertussis. TETANUS (Lockjaw) is rare in the [place] today. It causes painful muscle tightening and stiffness, usually all over the body.  It can lead to tightening of muscles in the head and neck so you  can't open your mouth, swallow, or sometimes even breathe. Tetanus kills about 1 out of 10 people who are infected even after receiving the best medical care. DIPHTHERIA is also rare in the [place] today. It can cause a thick coating to form in the back of the throat.  It can lead to breathing problems, heart failure, paralysis, and death. PERTUSSIS (Whooping Cough) causes severe coughing spells, which can cause difficulty breathing, vomiting and disturbed sleep.  It can also lead to weight loss, incontinence, and rib fractures. Up to 2 in 100 adolescents and 5 in 100 adults with pertussis are hospitalized or have complications, which could include pneumonia or death. These diseases are caused by bacteria. Diphtheria and pertussis are spread from person to person through secretions from coughing or sneezing. Tetanus enters the body through cuts, scratches, or wounds. Before vaccines, as many as 200,000 cases of diphtheria, 200,000 cases of pertussis, and hundreds of cases of tetanus, were reported in the [place] each year. Since vaccination began, reports of cases for tetanus and diphtheria have dropped by about 99% and for pertussis by about 80%. 2. Tdap vaccine Tdap vaccine can protect adolescents and adults from tetanus, diphtheria, and pertussis. One dose of Tdap is routinely given at age 25 or 25. People who did not get Tdap at that age should get it as soon as possible. Tdap is especially important for healthcare professionals and anyone having close contact with a baby younger than 12 months.  Pregnant women should get a dose of Tdap during every pregnancy, to protect the newborn from pertussis. Infants are most at risk for severe, life-threatening complications from pertussis. Another vaccine, called Td, protects against tetanus and diphtheria, but not pertussis. A Td booster should be given every 10 years. Tdap may be given as one of these boosters if you have never gotten Tdap  before. Tdap may also be given after a severe cut or burn to prevent tetanus infection. Your doctor or the person giving you the vaccine can give you more information. Tdap may safely be given at the same time as other vaccines. 3. Some people should not get this vaccine  A person who has ever had a life-threatening allergic reaction after a previous dose of any diphtheria, tetanus or pertussis containing vaccine, OR has a severe allergy to any part of this vaccine, should not get Tdap vaccine. Tell the person giving the vaccine about any severe allergies.  Anyone who had coma or long repeated seizures within 7 days after a childhood dose of DTP or DTaP, or a previous dose of Tdap, should not get Tdap, unless a cause other than the vaccine was found. They can still get Td.  Talk to your doctor if you:  have seizures or another nervous system problem,  had severe pain or swelling after any vaccine containing diphtheria, tetanus or pertussis,  ever had a condition called Guillain-Barr Syndrome (GBS),  aren't feeling well on the day the shot is scheduled. 4. Risks With any medicine, including vaccines, there is a chance of side effects. These are usually mild and go away on their own. Serious reactions are also possible but are rare. Most people who get Tdap vaccine do not have any problems with it. Mild problems following Tdap (Did not interfere with activities)  Pain where the shot was given (about 3 in 4 adolescents or 2 in 3 adults)  Redness or swelling where the shot was given (about 1 person in 5)  Mild fever of at least 100.65F (up to about 1 in 25 adolescents or 1 in 100 adults)  Headache (about 3 or 4 people in 10)  Tiredness (about 1 person in 3 or 4)  Nausea, vomiting, diarrhea, stomach ache (up to 1 in 4 adolescents or 1 in 10 adults)  Chills, sore joints (about 1 person in 10)  Body aches (about 1 person in 3 or 4)  Rash, swollen glands (uncommon) Moderate problems  following Tdap (Interfered with activities, but did not require medical attention)  Pain where the shot was given (up to 1 in 5 or 6)  Redness or swelling where the shot was given (up to about 1 in 16 adolescents or 1 in 12 adults)  Fever over 102F (about 1 in 100 adolescents or 1 in 250 adults)  Headache (about 1 in 7 adolescents or 1 in 10 adults)  Nausea, vomiting, diarrhea, stomach ache (up to 1 or 3 people in 100)  Swelling of the entire arm where the shot was given (up to about 1 in 500). Severe problems following Tdap (Unable to perform usual activities; required medical attention)  Swelling, severe pain, bleeding and redness in the arm where the shot was given (rare). Problems that could happen after any vaccine:  People sometimes faint after a medical procedure, including vaccination. Sitting or lying down for about 15 minutes can help prevent fainting, and injuries caused by a fall. Tell your doctor if you feel dizzy, or have vision changes  or ringing in the ears.  Some people get severe pain in the shoulder and have difficulty moving the arm where a shot was given. This happens very rarely.  Any medication can cause a severe allergic reaction. Such reactions from a vaccine are very rare, estimated at fewer than 1 in a million doses, and would happen within a few minutes to a few hours after the vaccination. As with any medicine, there is a very remote chance of a vaccine causing a serious injury or death. The safety of vaccines is always being monitored. For more information, visit: http://www.aguilar.org/ 5. What if there is a serious problem? What should I look for?  Look for anything that concerns you, such as signs of a severe allergic reaction, very high fever, or unusual behavior.  Signs of a severe allergic reaction can include hives, swelling of the face and throat, difficulty breathing, a fast heartbeat, dizziness, and weakness. These would usually start a few  minutes to a few hours after the vaccination. What should I do?  If you think it is a severe allergic reaction or other emergency that can't wait, call 9-1-1 or get the person to the nearest hospital. Otherwise, call your doctor.  Afterward, the reaction should be reported to the Vaccine Adverse Event Reporting System (VAERS). Your doctor might file this report, or you can do it yourself through the VAERS web site at www.vaers.SamedayNews.es, or by calling 709-880-0441. VAERS does not give medical advice.  6. The National Vaccine Injury Compensation Program The [employer] Vaccine Injury Compensation Program (VICP) is a federal program that was created to compensate people who may have been injured by certain vaccines. Persons who believe they may have been injured by a vaccine can learn about the program and about filing a claim by calling 5614760438 or visiting the [employer] website at GoldCloset.com.ee. There is a time limit to file a claim for compensation. 7. How can I learn more?  Ask your doctor. He or she can give you the vaccine package insert or suggest other sources of information.  Call your local or state health department.  Contact the Centers for Disease Control and Prevention (CDC):  Call (620) 809-1201 (1-800-CDC-INFO) or  Visit CDC's website at http://hunter.com/ CDC Tdap Vaccine VIS (08/04/13)   This information is not intended to replace advice given to you by your health care provider. Make sure you discuss any questions you have with your health care provider.   Document Released: 11/27/2011 Document Revised: 06/18/2014 Document Reviewed: 09/09/2013 Elsevier Interactive Patient Education [employer].

## 2015-12-01 NOTE — Progress Notes (Signed)
   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  Patient underwent a detail colposcopic evaluation external genitalia, perineum, perirectal region with no lesions seen. The speculum was introduced into the vagina followed by applying acetic acid. The entire vagina, fornix, and cervix were inspected and no lesions were seen. The transformation sewn was visualized no lesions were seen and a vigorous ECC was obtained was submitted for histological evaluation.   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 Negative colposcopic exam. ECC obtained. If ECC is negative will follow-up with Pap smear and HPV screen in 12 months.

## 2015-12-02 DIAGNOSIS — Z23 Encounter for immunization: Secondary | ICD-10-CM | POA: Diagnosis not present

## 2015-12-02 NOTE — Addendum Note (Signed)
Addended by: Burnett Kanaris on: 12/02/2015 08:16 AM   Modules accepted: Orders

## 2015-12-08 ENCOUNTER — Encounter (HOSPITAL_COMMUNITY): Payer: Self-pay | Admitting: *Deleted

## 2015-12-23 ENCOUNTER — Ambulatory Visit: Payer: Medicare Other | Admitting: Gynecology

## 2015-12-26 ENCOUNTER — Encounter: Payer: Self-pay | Admitting: Gynecology

## 2015-12-26 ENCOUNTER — Encounter (HOSPITAL_COMMUNITY): Payer: Self-pay | Admitting: Anesthesiology

## 2015-12-26 ENCOUNTER — Ambulatory Visit (INDEPENDENT_AMBULATORY_CARE_PROVIDER_SITE_OTHER): Payer: Medicaid Other | Admitting: Gynecology

## 2015-12-26 VITALS — BP 130/82 | Ht 64.0 in | Wt 221.0 lb

## 2015-12-26 DIAGNOSIS — N840 Polyp of corpus uteri: Secondary | ICD-10-CM

## 2015-12-26 DIAGNOSIS — R87610 Atypical squamous cells of undetermined significance on cytologic smear of cervix (ASC-US): Secondary | ICD-10-CM

## 2015-12-26 DIAGNOSIS — Z01818 Encounter for other preprocedural examination: Secondary | ICD-10-CM

## 2015-12-26 DIAGNOSIS — R87810 Cervical high risk human papillomavirus (HPV) DNA test positive: Secondary | ICD-10-CM

## 2015-12-26 DIAGNOSIS — D251 Intramural leiomyoma of uterus: Secondary | ICD-10-CM

## 2015-12-26 MED ORDER — TRAMADOL HCL 50 MG PO TABS: 50.0000 mg | ORAL_TABLET | Freq: Three times a day (TID) | ORAL | Status: DC | PRN

## 2015-12-26 MED ORDER — DICLOFENAC SODIUM 1 % TD GEL: 2.0000 g | Freq: Every day | TRANSDERMAL | Status: DC | PRN

## 2015-12-26 MED ORDER — GENTAMICIN SULFATE 40 MG/ML IJ SOLN
INTRAVENOUS | Status: AC
  Administered 2015-12-27: 115 mL via INTRAVENOUS
  Filled 2015-12-26: qty 9

## 2015-12-26 NOTE — Patient Instructions (Signed)
Conization of the Cervix  Cervical conization is the cutting (excision) of a cone-shaped portion of the cervix. The procedure is performed through the vagina in either your health care provider's office or an operating room. This procedure is usually done when there is abnormal bleeding from the cervix. It can also be done to evaluate an abnormal Pap test or if an abnormality is seen on the cervix during an exam. The tissue is then examined to see if there are precancerous cells or cancer present.   Conization of the cervix is not done during a menstrual period or pregnancy.   LET YOUR HEALTH CARE PROVIDER KNOW ABOUT:  · Any allergies you have.    · All medicines you are taking, including vitamins, herbs, eye drops, creams, and over-the-counter medicines.    · Previous problems you or members of your family have had with the use of anesthetics.    · Any blood disorders you have.    · Previous surgeries you have had.    · Medical conditions you have.    · Your smoking habits.    · The possibility of being pregnant.    RISKS AND COMPLICATIONS   Generally, conization of the cervix is a safe procedure. However, as with any procedure, complications can occur. Possible complications include:  · Heavy bleeding several days or weeks after the procedure. Light bleeding or spotting after the procedure is normal.  · Infection (rare).  · Damage to the cervix or surrounding organs (uncommon).    · Problems with the anesthesia.    · Increased risk of preterm labor in future pregnancies.  BEFORE THE PROCEDURE  · Do not eat or drink anything for 6-8 hours before the procedure.    · Do not take aspirin or blood thinners for at least a week before the procedure or as directed by your health care provider.    · Arrange for someone to take you home after the procedure.    PROCEDURE  There are three different methods to perform conization of the cervix. These include:   · The cold knife method. In this method a small cone-shaped sample  of tissue is cut out with a knife (scalpel) from the cervical canal and the transformation zone (where the normal cells end and the abnormal cells begin).    · The LEEP method. In this method a small cone-shaped sample of tissue is cut out with a thin wire that can burn (cauterize) the cervical tissue with an electrical current.    · Laser treatment. In this method a small cone-shaped sample of tissue is cut out and then cauterized with a laser beam to prevent bleeding.    The procedure will be performed as follows:   · Depending on the method, you will either be given a medicine to make you sleep (general anesthetic) or a numbing medicine (local anesthetic). A medicine that numbs the cervix (cervical block) may be given.    · A lubricated device called a speculum will be inserted into the vagina to spread open the walls of the vagina. This will help your health care provider see the inside of the vagina and cervix better.    · The tissue from the cervix will be removed and examined.    · The results of the procedure will help your health care provider decide if further treatment is necessary. They will also help your health care provider decide on the best treatment if your results are abnormal.  AFTER THE PROCEDURE  · If you had a general anesthetic, you may be groggy for 2-3 hours after   to 3 weeks.   You may have some cramping for about 1 week.   You may have bloody discharge or light bleeding for 1-2 weeks.   You may have black discharge coming from the vagina. This is from the paste used on the cervix to prevent bleeding. This is normal discharge.    This information is not intended to replace advice given to you by your health care provider. Make sure you discuss any questions you have with your health care provider.   Document  Released: 03/07/2005 Document Revised: 06/02/2013 Document Reviewed: 11/21/2012 Elsevier Interactive Patient Education [employer]. Hysteroscopy Hysteroscopy is a procedure used for looking inside the womb (uterus). It may be done for various reasons, including:  To evaluate abnormal bleeding, fibroid (benign, noncancerous) tumors, polyps, scar tissue (adhesions), and possibly cancer of the uterus.  To look for lumps (tumors) and other uterine growths.  To look for causes of why a woman cannot get pregnant (infertility), causes of recurrent loss of pregnancy (miscarriages), or a lost intrauterine device (IUD).  To perform a sterilization by blocking the fallopian tubes from inside the uterus. In this procedure, a thin, flexible tube with a tiny light and camera on the end of it (hysteroscope) is used to look inside the uterus. A hysteroscopy should be done right after a menstrual period to be sure you are not pregnant. LET [facility] CARE PROVIDER KNOW ABOUT:   Any allergies you have.  All medicines you are taking, including vitamins, herbs, eye drops, creams, and over-the-counter medicines.  Previous problems you or members of your family have had with the use of anesthetics.  Any blood disorders you have.  Previous surgeries you have had.  Medical conditions you have. RISKS AND COMPLICATIONS  Generally, this is a safe procedure. However, as with any procedure, complications can occur. Possible complications include:  Putting a hole in the uterus.  Excessive bleeding.  Infection.  Damage to the cervix.  Injury to other organs.  Allergic reaction to medicines.  Too much fluid used in the uterus for the procedure. BEFORE THE PROCEDURE   Ask your health care provider about changing or stopping any regular medicines.  Do not take aspirin or blood thinners for 1 week before the procedure, or as directed by your health care provider. These can cause bleeding.  If  you smoke, do not smoke for 2 weeks before the procedure.  In some cases, a medicine is placed in the cervix the day before the procedure. This medicine makes the cervix have a larger opening (dilate). This makes it easier for the instrument to be inserted into the uterus during the procedure.  Do not eat or drink anything for at least 8 hours before the surgery.  Arrange for someone to take you home after the procedure. PROCEDURE   You may be given a medicine to relax you (sedative). You may also be given one of the following:  A medicine that numbs the area around the cervix (local anesthetic).  A medicine that makes you sleep through the procedure (general anesthetic).  The hysteroscope is inserted through the vagina into the uterus. The camera on the hysteroscope sends a picture to a TV screen. This gives the surgeon a good view inside the uterus.  During the procedure, air or a liquid is put into the uterus, which allows the surgeon to see better.  Sometimes, tissue is gently scraped from inside the uterus. These tissue samples are sent to a lab for  testing. AFTER THE PROCEDURE   If you had a general anesthetic, you may be groggy for a couple hours after the procedure.  If you had a local anesthetic, you will be able to go home as soon as you are stable and feel ready.  You may have some cramping. This normally lasts for a couple days.  You may have bleeding, which varies from light spotting for a few days to menstrual-like bleeding for 3-7 days. This is normal.  If your test results are not back during the visit, make an appointment with your health care provider to find out the results.   This information is not intended to replace advice given to you by your health care provider. Make sure you discuss any questions you have with your health care provider.   Document Released: 09/03/2000 Document Revised: 03/18/2013 Document Reviewed: 12/25/2012 Elsevier Interactive Patient  Education [employer].

## 2015-12-26 NOTE — Progress Notes (Signed)
Patient presented to the office for her preoperative examination and consultation. She was seen back in June and we had discussed her endometrial polyps in her planned resectoscopic polypectomy for next week but at time of that exam we had done a Pap smear that had been overdue. 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 -NO INVASIVE NEOPLASM IDENTIFIED. -SEE COMMENT. Microscopic Comment The diagnosis is supported with p16 immunohistochemistry. (MEG:gt, 12/07/15)  For this reason in addition to the resectoscopic polypectomy she is going to have a cold knife cervical conization.  Otherwise her history is detailed from the office visit back in June 14 is as follows: Alyssa Montgomery is an 55 y.o. female as a referral from her primary care physician secondary to complain some mild pelvic pressure for the past 2 years she had previously been followed several years ago at the GYN clinic at [facility] and had had an endometrial biopsy and a D&C and biopsy was benign. Her provider had ordered a sonohysterogram and the following was documented by the radiologist:  Transvaginal and abdominal ultrasound April 12 demonstrated the following: ADDENDUM: After review of the examination with the with the sonographer the structure measuring 1.8 x 1.5 x 1.5 cm more closely reflects and endometrial abnormality such as a polyp. The previously noted posterior fibroid is no longer visualized. Therefore, a focal endometrial lesion is suspected. Consider sonohysterogram for further evaluation, prior to hysteroscopy or endometrial biopsy.   COMPARISON: 09/21/2015/sono hysterogram:  FINDINGS: 2.1 x 1.6 x  1.2 cm focal polypoid endometrial lesion along the uterine body.  Additional 9 x 5 x 7 mm focal polypoid endometrial lesion in the uterine fundus.  No significant vascularity.  Both lesions are mildly echogenic and favor endometrial polyps over submucosal fibroids.  IMPRESSION: Two focal polypoid endometrial lesions measuring up to 2.1 cm, as described above, favored to reflect endometrial polyps over submucosal fibroids.  Patient reports no vaginal bleeding she stated that she recently menopause at the age of 89 has not been any hormonal replacement therapy. Patient states that she had a colonoscopy back in 2011. Interestingly she has had 2 abdominal explorations for what sounds to me like small bowel obstruction when she was in her 26s. She is to have a substance abuse problems for cocaine and has not been using any drugs for several years according to her. She does not recall ever having any abnormal Pap smear although she has had history of chlamydia and gonorrhea in the past.  Patient is a gravida 6 para 2 (2 normal spontaneous vaginal deliveries, one elective abortion, 2 spontaneous AB and one abortion at 6 months elective?)   Pertinent Gynecological History: Menses: post-menopausal Bleeding: None Contraception: post menopausal status DES exposure: unknown Blood transfusions: ? Sexually transmitted diseases: Chlamydia gonorrhea Previous GYN Procedures:  see above  Last mammogram: normal Date: 2016 Last pap: normal Date: 2014 OB History: G 6, P 2A 4  Menstrual History: Menarche age: 41 No LMP recorded. Patient is postmenopausal.    Past Medical History  Diagnosis Date  . Diverticulitis   . Fibromyalgia   . Substance abuse     10 years ago ( cocaine)  . Bronchitis   . Anxiety     Past Surgical History  Procedure Laterality Date  . Bowel obstruction    . Back surgery    . Ankle surgery    . Eye surgery       removed right eye;   Marland Kitchen Ectopic pregnancy surgery      Family History  Problem Relation Age of Onset  . Hypertension Mother   . Arthritis Mother   . Cancer Father     lung    Social History:  reports that she has been smoking Cigarettes. She has a 9 pack-year smoking history. She has never used smokeless tobacco. She reports that she does not drink alcohol or use illicit drugs.  Allergies:  Allergies  Allergen Reactions  . Penicillins Other (See Comments)    unknown  . Sulfonamide Derivatives Itching     (Not in a hospital admission)  REVIEW OF SYSTEMS: A ROS was performed and pertinent positives and negatives are included in the history. GENERAL: No fevers or chills. HEENT: No change in vision, no earache, sore throat or sinus congestion. NECK: No pain or stiffness. CARDIOVASCULAR: No chest pain or pressure. No palpitations. PULMONARY: No shortness of breath, cough or wheeze. GASTROINTESTINAL: No abdominal pain, nausea, vomiting or diarrhea, melena or bright red blood per rectum. GENITOURINARY: No urinary frequency, urgency, hesitancy or dysuria. MUSCULOSKELETAL: No joint or muscle pain, no back pain, no recent trauma. DERMATOLOGIC: No rash, no itching, no lesions. ENDOCRINE: No polyuria, polydipsia, no heat or cold intolerance. No recent change in weight. HEMATOLOGICAL: No anemia or easy bruising or bleeding. NEUROLOGIC: No headache, seizures, numbness, tingling or weakness. PSYCHIATRIC: No depression, no loss of interest in normal activity or change in sleep pattern.     Blood pressure 132/84, height 5' 3.75" (1.619 m), weight 223 lb (101.152 kg).  Physical Exam:  HEENT:unremarkable Neck:Supple, midline, no thyroid megaly, no carotid bruits Lungs: Clear to auscultation no rhonchi's or wheezes Heart:Regular rate and rhythm, no murmurs or gallops Breast Exam: Not examined Abdomen: Midline scar Pelvic:BUS within normal limits Vagina:  No lesions or discharge Cervix: No lesions or discharge Uterus: Anteverted? Adnexa: No palpable masses or tenderness Extremities: No cords, no edema Rectal: Unremarkable  Because of the finding on sonohysterogram recently patient was counseled for an endometrial biopsy. First a Pap smear was obtained. The cervix with this cleansed with Betadine solution a single-tooth tenaculum was placed on the anterior cervical lip and a sterile Pipelle was introduced into the uterine cavity and moderate amount of tissue obtained submitted for histological evaluation. The CO2 tenaculum was removed. Patient tolerated procedure well.  Urinalysis today few bacteria 0-5 WBC, 3-10 RBC culture pending   Assessment/Plan: 55 year old postmenopausal patient with endometrial polyps as well as positive ECC on recent colposcopic exam of her cervix and will be scheduled to undergo resectoscopic polypectomy as well as a cold knife cervical conization in an outpatient setting. Endometrial biopsy was done today along with Pap smear result pending at time of this dictation. Risk benefits and pros and cons of the operation discussed with the patient as follows:   Patient was counseled as to the risk of surgery to include the following:  1. Infection (prohylactic antibiotics will be administered)  2. DVT/Pulmonary Embolism (prophylactic pneumo compression stockings will be used)  3.Trauma to internal organs requiring additional surgical procedure to repair any injury to   Internal organs requiring perhaps additional hospitalization days.  4.Hemmorhage requiring transfusion and blood products which carry risks such as anaphylactic reaction, hepatitis and AIDS  Patient had received literature information on the  procedure scheduled and all her questions were answered and fully accepts all risk.

## 2015-12-26 NOTE — Anesthesia Preprocedure Evaluation (Addendum)
Anesthesia Evaluation  Patient identified by MRN, date of birth, ID band Patient awake    Reviewed: Allergy & Precautions, H&P , NPO status , Patient's Chart, lab work & pertinent test results  History of Anesthesia Complications Negative for: history of anesthetic complications  Airway Mallampati: II  TM Distance: >3 FB Neck ROM: full    Dental no notable dental hx.    Pulmonary Current Smoker,    Pulmonary exam normal breath sounds clear to auscultation       Cardiovascular negative cardio ROS Normal cardiovascular exam Rhythm:regular Rate:Normal     Neuro/Psych  Headaches, Anxiety Depression    GI/Hepatic Neg liver ROS, GERD  ,  Endo/Other  diabetes  Renal/GU negative Renal ROS     Musculoskeletal  (+) Arthritis , Fibromyalgia -  Abdominal   Peds  Hematology negative hematology ROS (+)   Anesthesia Other Findings   Reproductive/Obstetrics negative OB ROS                            Anesthesia Physical Anesthesia Plan  ASA: II  Anesthesia Plan: General   Post-op Pain Management:    Induction: Intravenous  Airway Management Planned: LMA  Additional Equipment:   Intra-op Plan:   Post-operative Plan: Extubation in OR  Informed Consent: I have reviewed the patients History and Physical, chart, labs and discussed the procedure including the risks, benefits and alternatives for the proposed anesthesia with the patient or authorized representative who has indicated his/her understanding and acceptance.   Dental Advisory Given  Plan Discussed with: Anesthesiologist, CRNA and Surgeon  Anesthesia Plan Comments:         Anesthesia Quick Evaluation

## 2015-12-27 ENCOUNTER — Ambulatory Visit (HOSPITAL_COMMUNITY): Payer: Self-pay

## 2015-12-27 ENCOUNTER — Encounter (HOSPITAL_COMMUNITY)
Admission: RE | Disposition: A | Discharge status: Home / Self Care | Payer: Self-pay | Source: Ambulatory Visit | Attending: Gynecology

## 2015-12-27 ENCOUNTER — Ambulatory Visit (HOSPITAL_COMMUNITY): Payer: Self-pay | Admitting: Anesthesiology

## 2015-12-27 ENCOUNTER — Ambulatory Visit (HOSPITAL_COMMUNITY)
Admission: RE | Admit: 2015-12-27 | Discharge: 2015-12-28 | Disposition: A | Discharge status: Home / Self Care | Payer: Self-pay | Source: Ambulatory Visit | Attending: Gynecology | Admitting: Gynecology

## 2015-12-27 ENCOUNTER — Encounter (HOSPITAL_COMMUNITY): Payer: Self-pay

## 2015-12-27 DIAGNOSIS — R0600 Dyspnea, unspecified: Secondary | ICD-10-CM

## 2015-12-27 DIAGNOSIS — N840 Polyp of corpus uteri: Secondary | ICD-10-CM | POA: Insufficient documentation

## 2015-12-27 DIAGNOSIS — M797 Fibromyalgia: Secondary | ICD-10-CM | POA: Insufficient documentation

## 2015-12-27 DIAGNOSIS — N870 Mild cervical dysplasia: Secondary | ICD-10-CM | POA: Diagnosis not present

## 2015-12-27 DIAGNOSIS — D251 Intramural leiomyoma of uterus: Secondary | ICD-10-CM | POA: Diagnosis not present

## 2015-12-27 DIAGNOSIS — F17210 Nicotine dependence, cigarettes, uncomplicated: Secondary | ICD-10-CM | POA: Insufficient documentation

## 2015-12-27 DIAGNOSIS — R0602 Shortness of breath: Secondary | ICD-10-CM | POA: Insufficient documentation

## 2015-12-27 DIAGNOSIS — Z9889 Other specified postprocedural states: Secondary | ICD-10-CM

## 2015-12-27 DIAGNOSIS — D250 Submucous leiomyoma of uterus: Secondary | ICD-10-CM | POA: Insufficient documentation

## 2015-12-27 DIAGNOSIS — R140 Abdominal distension (gaseous): Secondary | ICD-10-CM

## 2015-12-27 DIAGNOSIS — Z880 Allergy status to penicillin: Secondary | ICD-10-CM | POA: Insufficient documentation

## 2015-12-27 DIAGNOSIS — N938 Other specified abnormal uterine and vaginal bleeding: Secondary | ICD-10-CM | POA: Insufficient documentation

## 2015-12-27 DIAGNOSIS — K219 Gastro-esophageal reflux disease without esophagitis: Secondary | ICD-10-CM | POA: Insufficient documentation

## 2015-12-27 HISTORY — PX: CERVICAL CONIZATION W/BX: SHX1330

## 2015-12-27 HISTORY — DX: Headache, unspecified: R51.9

## 2015-12-27 HISTORY — DX: Gastro-esophageal reflux disease without esophagitis: K21.9

## 2015-12-27 HISTORY — DX: Headache: R51

## 2015-12-27 HISTORY — PX: DILATATION & CURETTAGE/HYSTEROSCOPY WITH MYOSURE: SHX6511

## 2015-12-27 LAB — BASIC METABOLIC PANEL
Anion gap: 6 (ref 5–15)
BUN: 15 mg/dL (ref 6–20)
CO2: 26 mmol/L (ref 22–32)
Calcium: 8.2 mg/dL — ABNORMAL LOW (ref 8.9–10.3)
Chloride: 106 mmol/L (ref 101–111)
Creatinine, Ser: 0.74 mg/dL (ref 0.44–1.00)
GFR calc Af Amer: >60 mL/min (ref >60)
GFR calc non Af Amer: >60 mL/min (ref >60)
Glucose, Bld: 133 mg/dL — ABNORMAL HIGH (ref 65–99)
Potassium: 4.1 mmol/L (ref 3.5–5.1)
Sodium: 138 mmol/L (ref 135–145)

## 2015-12-27 LAB — CBC
HCT: 38.8 % (ref 36.0–46.0)
HCT: 37.6 % (ref 36.0–46.0)
Hemoglobin: 12.7 g/dL (ref 12.0–15.0)
Hemoglobin: 12 g/dL (ref 12.0–15.0)
MCH: 26.4 pg (ref 26.0–34.0)
MCH: 25.9 pg — ABNORMAL LOW (ref 26.0–34.0)
MCHC: 32.7 g/dL (ref 30.0–36.0)
MCHC: 31.9 g/dL (ref 30.0–36.0)
MCV: 80.7 fL (ref 78.0–100.0)
MCV: 81.2 fL (ref 78.0–100.0)
Platelets: 240 10*3/uL (ref 150–400)
Platelets: 200 10*3/uL (ref 150–400)
RBC: 4.81 MIL/uL (ref 3.87–5.11)
RBC: 4.63 MIL/uL (ref 3.87–5.11)
RDW: 15.6 % — ABNORMAL HIGH (ref 11.5–15.5)
RDW: 15.6 % — ABNORMAL HIGH (ref 11.5–15.5)
WBC: 6.6 10*3/uL (ref 4.0–10.5)
WBC: 5.9 10*3/uL (ref 4.0–10.5)

## 2015-12-27 SURGERY — DILATATION & CURETTAGE/HYSTEROSCOPY WITH MYOSURE
Anesthesia: General

## 2015-12-27 MED ORDER — FENTANYL CITRATE (PF) 250 MCG/5ML IJ SOLN
INTRAMUSCULAR | Status: DC | PRN
  Administered 2015-12-27: 50 ug via INTRAVENOUS
  Administered 2015-12-27 (×2): 25 ug via INTRAVENOUS
  Administered 2015-12-27: 50 ug via INTRAVENOUS

## 2015-12-27 MED ORDER — SIMETHICONE 80 MG PO CHEW: 80.0000 mg | CHEWABLE_TABLET | Freq: Four times a day (QID) | ORAL | Status: DC | PRN

## 2015-12-27 MED ORDER — GLYCOPYRROLATE 0.2 MG/ML IJ SOLN
INTRAMUSCULAR | Status: DC | PRN
  Administered 2015-12-27: 0.1 mg via INTRAVENOUS

## 2015-12-27 MED ORDER — ONDANSETRON HCL 4 MG/2ML IJ SOLN
INTRAMUSCULAR | Status: AC
  Filled 2015-12-27: qty 2

## 2015-12-27 MED ORDER — SODIUM CHLORIDE 0.9 % IJ SOLN
INTRAMUSCULAR | Status: DC | PRN
  Administered 2015-12-27: 30 mL via INTRAVENOUS

## 2015-12-27 MED ORDER — DEXAMETHASONE SODIUM PHOSPHATE 10 MG/ML IJ SOLN
INTRAMUSCULAR | Status: AC
  Filled 2015-12-27: qty 1

## 2015-12-27 MED ORDER — LACTATED RINGERS IV SOLN
INTRAVENOUS | Status: DC
  Administered 2015-12-27: 125 mL/h via INTRAVENOUS
  Administered 2015-12-27: 12:00:00 via INTRAVENOUS

## 2015-12-27 MED ORDER — LIDOCAINE HCL 2 % EX GEL
CUTANEOUS | Status: AC
  Filled 2015-12-27: qty 5

## 2015-12-27 MED ORDER — PROMETHAZINE HCL 25 MG PO TABS
25.0000 mg | ORAL_TABLET | Freq: Four times a day (QID) | ORAL | Status: DC | PRN
  Administered 2015-12-27 (×2): 25 mg via ORAL
  Filled 2015-12-27 (×2): qty 1

## 2015-12-27 MED ORDER — ALBUTEROL SULFATE (2.5 MG/3ML) 0.083% IN NEBU: 2.5000 mg | INHALATION_SOLUTION | Freq: Four times a day (QID) | RESPIRATORY_TRACT | Status: DC | PRN

## 2015-12-27 MED ORDER — LIDOCAINE-EPINEPHRINE (PF) 1 %-1:200000 IJ SOLN
INTRAMUSCULAR | Status: AC
  Filled 2015-12-27: qty 30

## 2015-12-27 MED ORDER — LIDOCAINE HCL 1 % IJ SOLN
INTRAMUSCULAR | Status: AC
  Filled 2015-12-27: qty 20

## 2015-12-27 MED ORDER — IODINE STRONG (LUGOLS) 5 % PO SOLN
ORAL | Status: AC
  Filled 2015-12-27: qty 1

## 2015-12-27 MED ORDER — LIDOCAINE HCL (CARDIAC) 20 MG/ML IV SOLN
INTRAVENOUS | Status: DC | PRN
  Administered 2015-12-27: 100 mg via INTRAVENOUS

## 2015-12-27 MED ORDER — LIDOCAINE-EPINEPHRINE (PF) 1 %-1:200000 IJ SOLN
INTRAMUSCULAR | Status: DC | PRN
  Administered 2015-12-27: 15 mL

## 2015-12-27 MED ORDER — SOOTHE HYDRATION 1.25 % OP SOLN: Freq: Two times a day (BID) | OPHTHALMIC | Status: DC

## 2015-12-27 MED ORDER — TRAMADOL HCL 50 MG PO TABS
50.0000 mg | ORAL_TABLET | Freq: Three times a day (TID) | ORAL | Status: DC | PRN
  Administered 2015-12-27 – 2015-12-28 (×2): 50 mg via ORAL
  Filled 2015-12-27 (×2): qty 1

## 2015-12-27 MED ORDER — KETOROLAC TROMETHAMINE 30 MG/ML IJ SOLN
INTRAMUSCULAR | Status: DC | PRN
  Administered 2015-12-27: 30 mg via INTRAVENOUS

## 2015-12-27 MED ORDER — MIDAZOLAM HCL 2 MG/2ML IJ SOLN
INTRAMUSCULAR | Status: DC | PRN
  Administered 2015-12-27: 1 mg via INTRAVENOUS

## 2015-12-27 MED ORDER — LACTATED RINGERS IV SOLN
INTRAVENOUS | Status: DC
  Administered 2015-12-27: 16:00:00 via INTRAVENOUS

## 2015-12-27 MED ORDER — MIDAZOLAM HCL 2 MG/2ML IJ SOLN
INTRAMUSCULAR | Status: AC
  Filled 2015-12-27: qty 2

## 2015-12-27 MED ORDER — POLYVINYL ALCOHOL 1.4 % OP SOLN
1.0000 [drp] | Freq: Two times a day (BID) | OPHTHALMIC | Status: DC
  Administered 2015-12-27 – 2015-12-28 (×3): 1 [drp] via OPHTHALMIC
  Filled 2015-12-27: qty 15

## 2015-12-27 MED ORDER — FENTANYL CITRATE (PF) 250 MCG/5ML IJ SOLN
INTRAMUSCULAR | Status: AC
  Filled 2015-12-27: qty 5

## 2015-12-27 MED ORDER — GLYCOPYRROLATE 0.2 MG/ML IJ SOLN
INTRAMUSCULAR | Status: AC
  Filled 2015-12-27: qty 1

## 2015-12-27 MED ORDER — PROPOFOL 10 MG/ML IV BOLUS
INTRAVENOUS | Status: DC | PRN
  Administered 2015-12-27: 150 mg via INTRAVENOUS
  Administered 2015-12-27: 30 mg via INTRAVENOUS

## 2015-12-27 MED ORDER — SODIUM CHLORIDE 0.9 % IR SOLN
Status: DC | PRN
  Administered 2015-12-27: 6000 mL
  Administered 2015-12-27: 3000 mL

## 2015-12-27 MED ORDER — EPHEDRINE SULFATE 50 MG/ML IJ SOLN
INTRAMUSCULAR | Status: DC | PRN
  Administered 2015-12-27: 10 mg via INTRAVENOUS

## 2015-12-27 MED ORDER — SODIUM CHLORIDE 0.9 % IJ SOLN
INTRAMUSCULAR | Status: AC
  Filled 2015-12-27: qty 50

## 2015-12-27 MED ORDER — ALBUTEROL SULFATE HFA 108 (90 BASE) MCG/ACT IN AERS: 2.0000 | INHALATION_SPRAY | Freq: Four times a day (QID) | RESPIRATORY_TRACT | Status: DC | PRN

## 2015-12-27 MED ORDER — PROMETHAZINE HCL 25 MG/ML IJ SOLN
6.2500 mg | INTRAMUSCULAR | Status: DC | PRN
  Administered 2015-12-27: 6.25 mg via INTRAVENOUS

## 2015-12-27 MED ORDER — VASOPRESSIN 20 UNIT/ML IV SOLN
INTRAVENOUS | Status: DC | PRN
  Administered 2015-12-27: 5 [IU]

## 2015-12-27 MED ORDER — ACETAMINOPHEN 325 MG PO TABS
650.0000 mg | ORAL_TABLET | Freq: Two times a day (BID) | ORAL | Status: DC | PRN
  Administered 2015-12-28: 975 mg via ORAL
  Filled 2015-12-27: qty 3

## 2015-12-27 MED ORDER — ONDANSETRON HCL 4 MG/2ML IJ SOLN
INTRAMUSCULAR | Status: DC | PRN
  Administered 2015-12-27: 4 mg via INTRAVENOUS

## 2015-12-27 MED ORDER — PROMETHAZINE HCL 25 MG/ML IJ SOLN
INTRAMUSCULAR | Status: AC
  Administered 2015-12-27: 6.25 mg via INTRAVENOUS
  Filled 2015-12-27: qty 1

## 2015-12-27 MED ORDER — SCOPOLAMINE 1 MG/3DAYS TD PT72
1.0000 | MEDICATED_PATCH | Freq: Once | TRANSDERMAL | Status: DC
  Administered 2015-12-27: 1.5 mg via TRANSDERMAL

## 2015-12-27 MED ORDER — SCOPOLAMINE 1 MG/3DAYS TD PT72
MEDICATED_PATCH | TRANSDERMAL | Status: AC
  Administered 2015-12-27: 1.5 mg via TRANSDERMAL
  Filled 2015-12-27: qty 1

## 2015-12-27 MED ORDER — DEXAMETHASONE SODIUM PHOSPHATE 4 MG/ML IJ SOLN
INTRAMUSCULAR | Status: DC | PRN
  Administered 2015-12-27: 10 mg via INTRAVENOUS

## 2015-12-27 MED ORDER — VASOPRESSIN 20 UNIT/ML IV SOLN
INTRAVENOUS | Status: AC
  Filled 2015-12-27: qty 1

## 2015-12-27 MED ORDER — FERRIC SUBSULFATE 259 MG/GM EX SOLN
CUTANEOUS | Status: AC
  Filled 2015-12-27: qty 8

## 2015-12-27 MED ORDER — LIDOCAINE HCL (CARDIAC) 20 MG/ML IV SOLN
INTRAVENOUS | Status: AC
  Filled 2015-12-27: qty 5

## 2015-12-27 MED ORDER — FENTANYL CITRATE (PF) 100 MCG/2ML IJ SOLN
INTRAMUSCULAR | Status: AC
  Filled 2015-12-27: qty 2

## 2015-12-27 MED ORDER — PHENYLEPHRINE HCL 10 MG/ML IJ SOLN
INTRAMUSCULAR | Status: DC | PRN
  Administered 2015-12-27 (×2): 40 ug via INTRAVENOUS

## 2015-12-27 MED ORDER — FENTANYL CITRATE (PF) 100 MCG/2ML IJ SOLN
25.0000 ug | INTRAMUSCULAR | Status: DC | PRN
  Administered 2015-12-27: 50 ug via INTRAVENOUS

## 2015-12-27 MED ORDER — PROPOFOL 10 MG/ML IV BOLUS
INTRAVENOUS | Status: AC
  Filled 2015-12-27: qty 20

## 2015-12-27 SURGICAL SUPPLY — 49 items
APPLICATOR COTTON TIP 6IN STRL (MISCELLANEOUS) ×2 IMPLANT
BLADE SURG 11 STRL SS (BLADE) ×2 IMPLANT
CANISTERS HI-FLOW 3000CC (CANNISTER) ×2 IMPLANT
CATH FOLEY 2WAY SLVR 30CC 16FR (CATHETERS) ×2 IMPLANT
CATH ROBINSON RED A/P 16FR (CATHETERS) ×2 IMPLANT
CATH ROBINSON RED A/P 8FR (CATHETERS) ×2 IMPLANT
CLOTH BEACON ORANGE TIMEOUT ST (SAFETY) ×2 IMPLANT
CONTAINER PREFILL 10% NBF 60ML (FORM) ×4 IMPLANT
COUNTER NEEDLE 1200 MAGNETIC (NEEDLE) ×2 IMPLANT
DEVICE MYOSURE LITE (MISCELLANEOUS) IMPLANT
DEVICE MYOSURE REACH (MISCELLANEOUS) ×2 IMPLANT
ELECT COAG BIPOL BALL 21FR (ELECTRODE) ×2 IMPLANT
ELECT LOOP LEEP RND 10X10 YLW (CUTTING LOOP) ×2
ELECT LOOP LEEP RND 15X12 GRN (CUTTING LOOP)
ELECT LOOP LEEP RND 20X12 WHT (CUTTING LOOP) ×2
ELECT REM PT RETURN 9FT ADLT (ELECTROSURGICAL) ×2
ELECTRODE LOOP LP RND 10X10YLW (CUTTING LOOP) ×1 IMPLANT
ELECTRODE LOOP LP RND 15X12GRN (CUTTING LOOP) IMPLANT
ELECTRODE LOOP LP RND 20X12WHT (CUTTING LOOP) ×1 IMPLANT
ELECTRODE REM PT RTRN 9FT ADLT (ELECTROSURGICAL) ×1 IMPLANT
FILTER ARTHROSCOPY CONVERTOR (FILTER) ×2 IMPLANT
GLOVE BIOGEL PI IND STRL 7.0 (GLOVE) ×1 IMPLANT
GLOVE BIOGEL PI IND STRL 8 (GLOVE) ×1 IMPLANT
GLOVE BIOGEL PI INDICATOR 7.0 (GLOVE) ×1
GLOVE BIOGEL PI INDICATOR 8 (GLOVE) ×1
GLOVE ECLIPSE 7.5 STRL STRAW (GLOVE) ×4 IMPLANT
GOWN STRL REUS W/TWL LRG LVL3 (GOWN DISPOSABLE) ×4 IMPLANT
HOSE NS SMOKE EVAC 7/8 X6 (MISCELLANEOUS) ×2 IMPLANT
NS IRRIG 1000ML POUR BTL (IV SOLUTION) ×2 IMPLANT
PACK VAGINAL MINOR WOMEN LF (CUSTOM PROCEDURE TRAY) ×2 IMPLANT
PAD OB MATERNITY 4.3X12.25 (PERSONAL CARE ITEMS) ×2 IMPLANT
PAD PREP 24X48 CUFFED NSTRL (MISCELLANEOUS) ×2 IMPLANT
PENCIL BUTTON HOLSTER BLD 10FT (ELECTRODE) ×2 IMPLANT
PLUG CATH AND CAP STER (CATHETERS) ×2 IMPLANT
SCOPETTES 8  STERILE (MISCELLANEOUS) ×1
SCOPETTES 8 STERILE (MISCELLANEOUS) ×1 IMPLANT
SEAL ROD LENS SCOPE MYOSURE (ABLATOR) ×2 IMPLANT
SET BERKELEY SUCTION TUBING (SUCTIONS) ×2 IMPLANT
SPONGE SURGIFOAM ABS GEL 12-7 (HEMOSTASIS) IMPLANT
SUT VIC AB 0 CT1 27 (SUTURE) ×2
SUT VIC AB 0 CT1 27XBRD ANBCTR (SUTURE) ×2 IMPLANT
SYR 30ML LL (SYRINGE) ×2 IMPLANT
SYR TB 1ML 25GX5/8 (SYRINGE) IMPLANT
TOWEL OR 17X24 6PK STRL BLUE (TOWEL DISPOSABLE) ×4 IMPLANT
TUBING AQUILEX INFLOW (TUBING) ×2 IMPLANT
TUBING AQUILEX OUTFLOW (TUBING) ×2 IMPLANT
TUBING SMOKE EVAC HOSE ADAPTER (MISCELLANEOUS) ×2 IMPLANT
WATER STERILE IRR 1000ML POUR (IV SOLUTION) ×2 IMPLANT
YANKAUER SUCT BULB TIP NO VENT (SUCTIONS) ×2 IMPLANT

## 2015-12-27 NOTE — H&P (View-Only) (Signed)
Patient presented to the office for her preoperative examination and consultation. She was seen back in June and we had discussed her endometrial polyps in her planned resectoscopic polypectomy for next week but at time of that exam we had done a Pap smear that had been overdue. 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 -NO INVASIVE NEOPLASM IDENTIFIED. -SEE COMMENT. Microscopic Comment The diagnosis is supported with p16 immunohistochemistry. (MEG:gt, 12/07/15)  For this reason in addition to the resectoscopic polypectomy she is going to have a cold knife cervical conization.  Otherwise her history is detailed from the office visit back in June 14 is as follows: Alyssa Montgomery is an 55 y.o. female as a referral from her primary care physician secondary to complain some mild pelvic pressure for the past 2 years she had previously been followed several years ago at the GYN clinic at [facility] and had had an endometrial biopsy and a D&C and biopsy was benign. Her provider had ordered a sonohysterogram and the following was documented by the radiologist:  Transvaginal and abdominal ultrasound April 12 demonstrated the following: ADDENDUM: After review of the examination with the with the sonographer the structure measuring 1.8 x 1.5 x 1.5 cm more closely reflects and endometrial abnormality such as a polyp. The previously noted posterior fibroid is no longer visualized. Therefore, a focal endometrial lesion is suspected. Consider sonohysterogram for further evaluation, prior to hysteroscopy or endometrial biopsy.   COMPARISON: 09/21/2015/sono hysterogram:  FINDINGS: 2.1 x 1.6 x  1.2 cm focal polypoid endometrial lesion along the uterine body.  Additional 9 x 5 x 7 mm focal polypoid endometrial lesion in the uterine fundus.  No significant vascularity.  Both lesions are mildly echogenic and favor endometrial polyps over submucosal fibroids.  IMPRESSION: Two focal polypoid endometrial lesions measuring up to 2.1 cm, as described above, favored to reflect endometrial polyps over submucosal fibroids.  Patient reports no vaginal bleeding she stated that she recently menopause at the age of 31 has not been any hormonal replacement therapy. Patient states that she had a colonoscopy back in 2011. Interestingly she has had 2 abdominal explorations for what sounds to me like small bowel obstruction when she was in her 45s. She is to have a substance abuse problems for cocaine and has not been using any drugs for several years according to her. She does not recall ever having any abnormal Pap smear although she has had history of chlamydia and gonorrhea in the past.  Patient is a gravida 6 para 2 (2 normal spontaneous vaginal deliveries, one elective abortion, 2 spontaneous AB and one abortion at 6 months elective?)   Pertinent Gynecological History: Menses: post-menopausal Bleeding: None Contraception: post menopausal status DES exposure: unknown Blood transfusions: ? Sexually transmitted diseases: Chlamydia gonorrhea Previous GYN Procedures:  see above  Last mammogram: normal Date: 2016 Last pap: normal Date: 2014 OB History: G 6, P 2A 4  Menstrual History: Menarche age: 67 No LMP recorded. Patient is postmenopausal.    Past Medical History  Diagnosis Date  . Diverticulitis   . Fibromyalgia   . Substance abuse     10 years ago ( cocaine)  . Bronchitis   . Anxiety     Past Surgical History  Procedure Laterality Date  . Bowel obstruction    . Back surgery    . Ankle surgery    . Eye surgery       removed right eye;   Marland Kitchen Ectopic pregnancy surgery      Family History  Problem Relation Age of Onset  . Hypertension Mother   . Arthritis Mother   . Cancer Father     lung    Social History:  reports that she has been smoking Cigarettes. She has a 9 pack-year smoking history. She has never used smokeless tobacco. She reports that she does not drink alcohol or use illicit drugs.  Allergies:  Allergies  Allergen Reactions  . Penicillins Other (See Comments)    unknown  . Sulfonamide Derivatives Itching     (Not in a hospital admission)  REVIEW OF SYSTEMS: A ROS was performed and pertinent positives and negatives are included in the history. GENERAL: No fevers or chills. HEENT: No change in vision, no earache, sore throat or sinus congestion. NECK: No pain or stiffness. CARDIOVASCULAR: No chest pain or pressure. No palpitations. PULMONARY: No shortness of breath, cough or wheeze. GASTROINTESTINAL: No abdominal pain, nausea, vomiting or diarrhea, melena or bright red blood per rectum. GENITOURINARY: No urinary frequency, urgency, hesitancy or dysuria. MUSCULOSKELETAL: No joint or muscle pain, no back pain, no recent trauma. DERMATOLOGIC: No rash, no itching, no lesions. ENDOCRINE: No polyuria, polydipsia, no heat or cold intolerance. No recent change in weight. HEMATOLOGICAL: No anemia or easy bruising or bleeding. NEUROLOGIC: No headache, seizures, numbness, tingling or weakness. PSYCHIATRIC: No depression, no loss of interest in normal activity or change in sleep pattern.     Blood pressure 132/84, height 5' 3.75" (1.619 m), weight 223 lb (101.152 kg).  Physical Exam:  HEENT:unremarkable Neck:Supple, midline, no thyroid megaly, no carotid bruits Lungs: Clear to auscultation no rhonchi's or wheezes Heart:Regular rate and rhythm, no murmurs or gallops Breast Exam: Not examined Abdomen: Midline scar Pelvic:BUS within normal limits Vagina:  No lesions or discharge Cervix: No lesions or discharge Uterus: Anteverted? Adnexa: No palpable masses or tenderness Extremities: No cords, no edema Rectal: Unremarkable  Because of the finding on sonohysterogram recently patient was counseled for an endometrial biopsy. First a Pap smear was obtained. The cervix with this cleansed with Betadine solution a single-tooth tenaculum was placed on the anterior cervical lip and a sterile Pipelle was introduced into the uterine cavity and moderate amount of tissue obtained submitted for histological evaluation. The CO2 tenaculum was removed. Patient tolerated procedure well.  Urinalysis today few bacteria 0-5 WBC, 3-10 RBC culture pending   Assessment/Plan: 55 year old postmenopausal patient with endometrial polyps as well as positive ECC on recent colposcopic exam of her cervix and will be scheduled to undergo resectoscopic polypectomy as well as a cold knife cervical conization in an outpatient setting. Endometrial biopsy was done today along with Pap smear result pending at time of this dictation. Risk benefits and pros and cons of the operation discussed with the patient as follows:   Patient was counseled as to the risk of surgery to include the following:  1. Infection (prohylactic antibiotics will be administered)  2. DVT/Pulmonary Embolism (prophylactic pneumo compression stockings will be used)  3.Trauma to internal organs requiring additional surgical procedure to repair any injury to   Internal organs requiring perhaps additional hospitalization days.  4.Hemmorhage requiring transfusion and blood products which carry risks such as anaphylactic reaction, hepatitis and AIDS  Patient had received literature information on the  procedure scheduled and all her questions were answered and fully accepts all risk.

## 2015-12-27 NOTE — Progress Notes (Signed)
Subjective: Patient reports + flatus and no problems voiding.    Objective: I have reviewed patient's vital signs, intake and output and medications.  General: alert and cooperative Resp: clear to auscultation bilaterally Cardio: regular rate and rhythm, S1, S2 normal, no murmur, click, rub or gallop GI: soft, non-tender; bowel sounds normal; no masses,  no organomegaly Extremities: extremities normal, atraumatic, no cyanosis or edema Vaginal pad light peak discharge   Assessment/Plan: Patient ambulating and tolerating liquid diet. Has passed flatus and ambulating and voiding without difficulty. Patient hungry for food. Abdomen soft with positive BS all four quadrants. Will HepLock IV and start soft diet. CBC and CMET in am.      Terrance Mass 12/27/2015, 6:27 PM

## 2015-12-27 NOTE — Anesthesia Procedure Notes (Addendum)
Procedure Name: LMA Insertion Date/Time: 12/27/2015 7:31 AM Performed by: Flossie Dibble Pre-anesthesia Checklist: Timeout performed, Patient being monitored, Suction available, Emergency Drugs available and Patient identified Patient Re-evaluated:Patient Re-evaluated prior to inductionOxygen Delivery Method: Circle system utilized Preoxygenation: Pre-oxygenation with 100% oxygen Intubation Type: IV induction LMA: LMA inserted LMA Size: 4.0 Number of attempts: 1 Placement Confirmation: positive ETCO2 and breath sounds checked- equal and bilateral Tube secured with: Tape Dental Injury: Teeth and Oropharynx as per pre-operative assessment    Performed by: Flossie Dibble

## 2015-12-27 NOTE — Transfer of Care (Signed)
Immediate Anesthesia Transfer of Care Note  Patient: Alyssa Montgomery  Procedure(s) Performed: Procedure(s): DILATATION & CURETTAGE/HYSTEROSCOPY WITH MYOSURE (N/A) CONIZATION CERVIX WITH BIOPSY (N/A)  Patient Location: PACU  Anesthesia Type:General  Level of Consciousness: awake, alert  and oriented  Airway & Oxygen Therapy: Patient Spontanous Breathing and Patient connected to nasal cannula oxygen  Post-op Assessment: Report given to RN and Post -op Vital signs reviewed and stable  Post vital signs: Reviewed and stable  Last Vitals:  Filed Vitals:   12/27/15 0649  BP: 132/88  Pulse: 71  Temp: 36.4 C  Resp: 18    Last Pain: There were no vitals filed for this visit.    Patients Stated Pain Goal: 6 ([id number])  Complications: No apparent anesthesia complications

## 2015-12-27 NOTE — Addendum Note (Signed)
Addendum  created 12/27/15 1926 by Jonna Munro, CRNA   Modules edited: Clinical Notes   Clinical Notes:  File: [id number]

## 2015-12-27 NOTE — Interval H&P Note (Signed)
History and Physical Interval Note:  12/27/2015 7:05 AM  Alyssa Montgomery  has presented today for surgery, with the diagnosis of endometrial polyps,CERVICAL INTRAEPITHELIAL  NEOPLASIA I AND 2  The various methods of treatment have been discussed with the patient and family. After consideration of risks, benefits and other options for treatment, the patient has consented to  Procedure(s): DILATATION & CURETTAGE/HYSTEROSCOPY WITH MYOSURE (N/A) CONIZATION CERVIX WITH BIOPSY (N/A) as a surgical intervention .  The patient's history has been reviewed, patient examined, no change in status, stable for surgery.  I have reviewed the patient's chart and labs.  Questions were answered to the patient's satisfaction.     Terrance Mass

## 2015-12-27 NOTE — Anesthesia Postprocedure Evaluation (Addendum)
Anesthesia Post Note  Patient: Alyssa Montgomery  Procedure(s) Performed: Procedure(s) (LRB): DILATATION & CURETTAGE/HYSTEROSCOPY WITH MYOSURE (N/A) CONIZATION CERVIX WITH BIOPSY (N/A)  Patient location during evaluation: PACU Anesthesia Type: General Level of consciousness: awake and alert Pain management: pain level controlled Vital Signs Assessment: post-procedure vital signs reviewed and stable Respiratory status: spontaneous breathing, nonlabored ventilation, respiratory function stable and patient connected to nasal cannula oxygen Cardiovascular status: blood pressure returned to baseline and stable Postop Assessment: no signs of nausea or vomiting Anesthetic complications: no Comments: Patient with prolonged complaints of nausea and abdominal fullness/discomfort situated centrally in mid upper abdomen, her CXR is unremarkable, Na is within normal limits and her lungs are clear with completely normal vital signs, KUB is unremarkable as well, Hgb is baseline at 12.. Dr. Toney Rakes aware and has evaluated [name].. She is now feeling much better after successive belches.. She will be admitted and monitored overnight for conservative approach     Last Vitals:  Filed Vitals:   12/27/15 0915 12/27/15 0930  BP: 107/71 114/73  Pulse: 65 70  Temp:    Resp: 16 15    Last Pain:  Filed Vitals:   12/27/15 0937  PainSc: 0-No pain   Pain Goal: Patients Stated Pain Goal: 6 (12/27/15 0649)               Zenaida Deed

## 2015-12-27 NOTE — Interval H&P Note (Signed)
History and Physical Interval Note:  12/27/2015 7:09 AM  Alyssa Montgomery  has presented today for surgery, with the diagnosis of endometrial polyps,CERVICAL INTRAEPITHELIAL  NEOPLASIA I AND 2  The various methods of treatment have been discussed with the patient and family. After consideration of risks, benefits and other options for treatment, the patient has consented to  Procedure(s): DILATATION & CURETTAGE/HYSTEROSCOPY WITH MYOSURE (N/A) CONIZATION CERVIX WITH BIOPSY (N/A) as a surgical intervention .  The patient's history has been reviewed, patient examined, no change in status, stable for surgery.  I have reviewed the patient's chart and labs.  Questions were answered to the patient's satisfaction.     Terrance Mass

## 2015-12-27 NOTE — Anesthesia Postprocedure Evaluation (Signed)
Anesthesia Post Note  Patient: Alyssa Montgomery  Procedure(s) Performed: Procedure(s) (LRB): DILATATION & CURETTAGE/HYSTEROSCOPY WITH MYOSURE (N/A) CONIZATION CERVIX WITH BIOPSY (N/A)  Patient location during evaluation: [facility] Anesthesia Type: General Level of consciousness: awake and alert and oriented Pain management: pain level controlled Vital Signs Assessment: post-procedure vital signs reviewed and stable Respiratory status: spontaneous breathing, nonlabored ventilation and respiratory function stable Cardiovascular status: stable Postop Assessment: no signs of nausea or vomiting and adequate PO intake Anesthetic complications: no     Last Vitals:  Filed Vitals:   12/27/15 1515 12/27/15 1721  BP: 118/79 141/85  Pulse: 77 82  Temp: 36.7 C 36.6 C  Resp: 20 20    Last Pain:  Filed Vitals:   12/27/15 1857  PainSc: 3    Pain Goal: Patients Stated Pain Goal: 4 (12/27/15 1515)               Willa Rough

## 2015-12-27 NOTE — H&P (View-Only) (Signed)
   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  Patient states that in her 55s she had some form of mild dysplasia was treated with cryotherapy and subsequent to that her patches been normal.  Patient underwent a detail colposcopic evaluation external genitalia, perineum, perirectal region with no lesions seen. The speculum was introduced into the vagina followed by applying acetic acid. The entire vagina, fornix, and cervix were inspected and no lesions were seen. The transformation sewn was visualized no lesions were seen and a vigorous ECC was obtained was submitted for histological evaluation.   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 Negative colposcopic exam. ECC obtained. If ECC is negative will follow-up with Pap smear and HPV screen in 12 months.

## 2015-12-27 NOTE — Op Note (Signed)
Operative Note  12/27/2015  1:23 PM  PATIENT:  Alyssa Montgomery  55 y.o. 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 Patient had PAS stockings for DVT prophylaxis. She received ampicillin and gentamicin for prophylaxis because of her allergy to penicillin.. Time out was undertaken to properly identify the patient and the proper operation schedule. The vagina and perineum were prepped and draped in usual sterile fashion. A red rubber Quentin Cornwall was inserted to empty the bladder is content for approximately 50 cc. Bimanual examination demonstrated an anteverted uterus. Patient's legs were in the high lithotomy position. A weighted speculum was placed in the posterior vaginal vault. A single-tooth tenaculum was placed on the anterior cervical lip. The uterus sounded to 8/2 cm. Pratt dilator were used to dilate the cervical canal to a 19 mm size. The Hologic Myosure resectoscopic morcellator with a scope of 6.25 mm and an operating blade of 3.0 mm was introduced into the intrauterine cavity. 0.9% normal saline was the distending media. Inspection of the endometrial cavity demonstrated multiple small uterine polyps located in the lower uterine segment and mid uterine cavity right side a submucous myoma was noted running anteriorly or to the contralateral side of the uterus. With the resectoscopic morcellator they were removed and submitted for histological evaluation.  Fluid deficit was 1650 cc. It was noted that one of the areas where the myoma was resected began to bleed so immediately  the working element was changed for the roller bar.  The roller bar was introduced into the uterine cavity. The [employer] electrical surgical generator settings were 120 on the cutting mode and 90 on the cautery mode. The small vessels were cauterized. Inspection of the cavity did not demonstrate any further bleeding.   Attention was then placed a second portion of the procedure which was a LEEP cervical conization as follows   Approximately 15 cc's of 1 presented lidocaine with 1 100,000 epinephrine was infiltrated deep near the outer margin of the transformation zone circumferentially at 12, 3, 6, and 9 o'clock positions.  The [facility] Electrosurgical Generator was then turned on after the patient was grounded with pad electrode on her thigh and jewelry removed.  The settings on the generator were Blend 1 current 120 watts cut and 120 watts on the coagulation mode.  A size 150 loop electrode was utilized to exercise the atypical transformation zone.  The tip of the electrode was placed 3 mm from the edge of the lesion at 3, 6, 9 and 12 o'clock position.  The electrode was moved slowly over the lesion was within the loop limits.  A vaginal wall retractor was used.  The loop was then repositioned and the finger switch on the hand held piece was activated ( or footpedal depressed).  A slight pressure on the shaft was applied and the loop was extended into the tissue up to its crossbar to a depth of 6 mm, then with steady, slow motion across and underneath the endocervical button was excised.  The loop electrode was replaced with ball electrode the  base of the crater was fulgurated circumferentially.  Specimen was passed off the operative field and submitted for histological evaluation   Addendum:  An the recovery room patient was experiencing some lower subdiaphragmatic  discomfort is some slight shortness of breath. A chest x-ray was done in the recovery room which was normal. She felt slightly distended a flatplate x-ray of the abdomen was reported to be normal as well. Her preoperative hemoglobin was 12.7 postop hemoglobin was 12.0 her vital signs remained stable. Pulmonary auscultation lungs were clear. Abdomen was soft four-quadrant positive bowel sounds. Patient had gotten up twice to urinate and was starting to feel hungry and much better.  I'm going to admit her overnight for observation and repeat her CBC and electrolytes in the morning. We'll keep her on clear liquid diet and maybe advance later tonight or tomorrow morning. I have explainedto her and her husband the precautions were going to take thank you for overnight for observation.    [facility] HMD1:31 PMTD@

## 2015-12-27 NOTE — Progress Notes (Signed)
I received a referral from RN due to pt's high level of agitation at being in the hospital overnight.  Pt was feeling queasy after eating.  She was not up for talking much at this time, but appreciated the pastoral presence while she found a comfortable position.  Chaplain Janne Napoleon, Bcc Pager, [phone] 4:07 PM    12/27/15 1500  Clinical Encounter Type  Visited With Patient and family together  Visit Type Spiritual support  Referral From Nurse  Spiritual Encounters  Spiritual Needs Emotional

## 2015-12-28 ENCOUNTER — Telehealth: Payer: Self-pay | Admitting: *Deleted

## 2015-12-28 LAB — CBC
HCT: 39.6 % (ref 36.0–46.0)
Hemoglobin: 13.2 g/dL (ref 12.0–15.0)
MCH: 26.9 pg (ref 26.0–34.0)
MCHC: 33.3 g/dL (ref 30.0–36.0)
MCV: 80.8 fL (ref 78.0–100.0)
Platelets: 244 10*3/uL (ref 150–400)
RBC: 4.9 MIL/uL (ref 3.87–5.11)
RDW: 15.6 % — ABNORMAL HIGH (ref 11.5–15.5)
WBC: 12.1 10*3/uL — ABNORMAL HIGH (ref 4.0–10.5)

## 2015-12-28 LAB — COMPREHENSIVE METABOLIC PANEL
ALT: 22 U/L (ref 14–54)
AST: 20 U/L (ref 15–41)
Albumin: 3.7 g/dL (ref 3.5–5.0)
Alkaline Phosphatase: 104 U/L (ref 38–126)
Anion gap: 6 (ref 5–15)
BUN: 18 mg/dL (ref 6–20)
CO2: 26 mmol/L (ref 22–32)
Calcium: 8.9 mg/dL (ref 8.9–10.3)
Chloride: 107 mmol/L (ref 101–111)
Creatinine, Ser: 0.75 mg/dL (ref 0.44–1.00)
GFR calc Af Amer: >60 mL/min (ref >60)
GFR calc non Af Amer: >60 mL/min (ref >60)
Glucose, Bld: 149 mg/dL — ABNORMAL HIGH (ref 65–99)
Potassium: 4 mmol/L (ref 3.5–5.1)
Sodium: 139 mmol/L (ref 135–145)
Total Bilirubin: 0.4 mg/dL (ref 0.3–1.2)
Total Protein: 7.2 g/dL (ref 6.5–8.1)

## 2015-12-28 MED ORDER — TRAMADOL HCL 50 MG PO TABS: 50.0000 mg | ORAL_TABLET | Freq: Four times a day (QID) | ORAL | Status: DC | PRN

## 2015-12-28 NOTE — Telephone Encounter (Signed)
Dr.Fernandez Rx for ultram 50 mg tablets never went to pharmacy, Rx was printed. I called Rx in today from pt surgery yesterday.

## 2015-12-28 NOTE — Progress Notes (Signed)
Alyssa Montgomery was feeling much better today than when I saw her yesterday.  She was preparing to discharge home and was eager to get there.  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  She also spoke about her stress over her daughter who is having difficulties right now and leaving the responsibility of caring for her pets to her mother.  We talked about a plan of care for the animals so that she could not have that on her during her recovery.  We also spoke about her faith and how important it is to her.  I offered prayer at her request and provided reflective listening.  Chaplain Janne Napoleon, Bcc Pager, [phone] 4:35 PM    12/28/15 1600  Clinical Encounter Type  Visited With Patient  Visit Type Spiritual support  Referral From Nurse  Spiritual Encounters  Spiritual Needs Emotional;Prayer  Stress Factors  Patient Stress Factors Family relationships

## 2015-12-28 NOTE — Discharge Summary (Signed)
Physician Discharge Summary  Patient ID: Alyssa Montgomery MRN: [id number] DOB/AGE: 56/01/1961 56 y.o.  Admit date: 12/27/2015 Discharge date: 12/28/2015  Admission Diagnoses: Observation status post resectoscopic polypectomy and LEEP cervical conization. Dysfunctional uterine bleeding cervical dysplasia  Discharge Diagnoses: Same Active Problems:   Postoperative state   Discharged Condition: good  Hospital Course: Patient was omitted to the hospital on the morning of July 19 where she underwent a resectoscopic polypectomy/myomectomy and LEEP cervical conization. Patient did well intraoperatively. Normal saline had been uses the distending media and fluid deficit was 1650 cc. In the recovery room patient experienced some slight shortness of breath and mid lower abdominal discomfort and had a chest x-ray and KUB which were normal. Her CBC preop and postop were normal as well as a comprehensive metabolic panel was normal. Her vital signs otherwise stable. She was kept in the hospital overnight for observation. She did well she was normotensive afebrile was ambulating showered had passed gas and was on liquid diet had been advanced to a soft diet. This morning her lungs were clear to auscultation her abdomen was soft positive bowel sounds all 4 quadrants she had passed gas twice. Her CBC and conference metabolic panel were normal as well and she was going to start regular diet in the home by lunchtime. No vaginal bleeding reported. Patient smokes one pack cigarette per day. She was using the incentive spirometer. She was counseled on the detrimental effects of smoking when she comes to the office for postop exam we will discuss starting her on Chantix.  Consults: None  Significant Diagnostic Studies: labs: Preoperative hemoglobin 12.0 postoperative hemoglobin 13.2 WBC 12.1  glucose 149. Pathology report pending  Treatments: surgery: Resectoscopic polypectomy myomectomy and LEEP cervical  conization  Discharge Exam: Blood pressure 125/85, pulse 72, temperature 98.3 F (36.8 C), temperature source Oral, resp. rate 18, height 5\' 4"  (1.626 m), weight 212 lb (96.163 kg), SpO2 100 %. General appearance: alert and cooperative Resp: clear to auscultation bilaterally GI: soft, non-tender; bowel sounds normal; no masses,  no organomegaly Extremities: extremities normal, atraumatic, no cyanosis or edema  No vaginal bleeding  Disposition: 01-Home or Self Care  Discharge Instructions    Call MD for:  difficulty breathing, headache or visual disturbances    Complete by:  As directed      Call MD for:  difficulty breathing, headache or visual disturbances    Complete by:  As directed      Call MD for:  hives    Complete by:  As directed      Call MD for:  hives    Complete by:  As directed      Call MD for:  persistant dizziness or light-headedness    Complete by:  As directed      Call MD for:  persistant dizziness or light-headedness    Complete by:  As directed      Call MD for:  persistant nausea and vomiting    Complete by:  As directed      Call MD for:  persistant nausea and vomiting    Complete by:  As directed      Call MD for:  redness, tenderness, or signs of infection (pain, swelling, redness, odor or green/yellow discharge around incision site)    Complete by:  As directed      Call MD for:  redness, tenderness, or signs of infection (pain, swelling, redness, odor or green/yellow discharge around incision site)    Complete by:  As directed      Call MD for:  severe uncontrolled pain    Complete by:  As directed      Call MD for:  severe uncontrolled pain    Complete by:  As directed      Call MD for:  temperature >100.4    Complete by:  As directed      Call MD for:  temperature >100.4    Complete by:  As directed      Diet general    Complete by:  As directed      Diet general    Complete by:  As directed      Discharge instructions    Complete by:  As  directed   Post op appointment Dr. Toney Rakes August 9 at 9:30am General Postsurgical Instructions, Adult  You may expect to feel dizzy, weak, and drowsy for as long as 24 hours after receiving the medicine that made you sleep (anesthetic). The following information pertains to your recovery period for the first 24 hours following surgery. Do not drive a car, ride a bicycle, participate in physical activities, or take public transportation until you are done taking narcotic pain medicines or as directed by your caregiver.  Do not drink alcohol or take tranquilizers.  Do not take medicine that has not been prescribed by your caregiver.  Do not sign important papers or make important decisions while on narcotic pain medicines.  Have a responsible person with you.  CARE OF INCISION Change bandages (dressings) as directed.  Take showers instead of baths until your caregiver gives you permission to take baths. Check with your caregiver if you have tubes coming from the wound site.  Avoid heavy lifting (more than 10 pounds/4.5 kilograms), pushing, or pulling.  Avoid activities that may risk injury to your surgical site.  Only take over-the-counter or prescription medicines for pain, discomfort, or fever as directed by your caregiver. Do not take aspirin. It can make you bleed. Take medicines (antibiotics) that kill germs as directed.  SEEK MEDICAL CARE IF: You feel sick to your stomach (nauseous).  You start to throw up (vomit).  You have trouble eating or drinking.  You have an oral temperature above 100.  You have constipation that is not helped by adjusting diet or increasing fluid intake. Pain medicines are a common cause of constipation.  SEEK IMMEDIATE MEDICAL CARE IF: You have persistent dizziness.  You have difficulty breathing or a congested sounding (croupy) cough.  You have an oral temperature above 100, not controlled by medicine.  There is increasing pain or tenderness near or in the  surgical site.  Document Released: 09/09/2000 Document Re-Released: 08/22/2009 [facility] Patient Information 2011 [place].   [facility] HMD9:05 AMTD@     Discharge instructions    Complete by:  As directed   Post op appointment with Dr. Toney Rakes August 9 at 9:30 am General Postsurgical Instructions, Adult  You may expect to feel dizzy, weak, and drowsy for as long as 24 hours after receiving the medicine that made you sleep (anesthetic). The following information pertains to your recovery period for the first 24 hours following surgery. Do not drive a car, ride a bicycle, participate in physical activities, or take public transportation until you are done taking narcotic pain medicines or as directed by your caregiver.  Do not drink alcohol or take tranquilizers.  Do not take medicine that has not been prescribed by your caregiver.  Do not sign important papers or make  important decisions while on narcotic pain medicines.  Have a responsible person with you.  CARE OF INCISION Change bandages (dressings) as directed.  Take showers instead of baths until your caregiver gives you permission to take baths. Check with your caregiver if you have tubes coming from the wound site.  Avoid heavy lifting (more than 10 pounds/4.5 kilograms), pushing, or pulling.  Avoid activities that may risk injury to your surgical site.  Only take over-the-counter or prescription medicines for pain, discomfort, or fever as directed by your caregiver. Do not take aspirin. It can make you bleed. Take medicines (antibiotics) that kill germs as directed.  SEEK MEDICAL CARE IF: You feel sick to your stomach (nauseous).  You start to throw up (vomit).  You have trouble eating or drinking.  You have an oral temperature above 100.  You have constipation that is not helped by adjusting diet or increasing fluid intake. Pain medicines are a common cause of constipation.  SEEK IMMEDIATE MEDICAL CARE IF: You have  persistent dizziness.  You have difficulty breathing or a congested sounding (croupy) cough.  You have an oral temperature above 100, not controlled by medicine.  There is increasing pain or tenderness near or in the surgical site.  Document Released: 09/09/2000 Document Re-Released: 08/22/2009 [facility] Patient Information 2011 [place].   [facility] HMD7:20 AMTD@     Driving Restrictions    Complete by:  As directed   1 week     Driving Restrictions    Complete by:  As directed   1 week     Increase activity slowly    Complete by:  As directed      Increase activity slowly    Complete by:  As directed      Lifting restrictions    Complete by:  As directed   6 weeks     Lifting restrictions    Complete by:  As directed   6 weeks     Sexual Activity Restrictions    Complete by:  As directed   6 weeks     Sexual Activity Restrictions    Complete by:  As directed   6 weeks            Medication List    STOP taking these medications        naproxen sodium 220 MG tablet  Commonly known as:  ANAPROX      TAKE these medications        acetaminophen 325 MG tablet  Commonly known as:  TYLENOL  Take 650-975 mg by mouth 2 (two) times daily as needed for mild pain.     albuterol 108 (90 Base) MCG/ACT inhaler  Commonly known as:  PROVENTIL HFA;VENTOLIN HFA  Inhale 2 puffs into the lungs every 6 (six) hours as needed for wheezing.     bismuth subsalicylate 99991111 [id number] suspension  Commonly known as:  PEPTO BISMOL  Take 30 mLs by mouth every 6 (six) hours as needed for indigestion.     CoQ-10 200 MG Caps  Take 1 capsule by mouth daily. Reported on 12/26/2015     cyclobenzaprine 5 MG tablet  Commonly known as:  FLEXERIL  Take 5 mg by mouth 3 (three) times daily as needed for muscle spasms. Reported on 12/26/2015     D3-1000 1000 units tablet  Generic drug:  Cholecalciferol  Take 1,000 Units by mouth daily. Reported on 12/26/2015     diclofenac sodium 1 %  Gel  Commonly known as:  VOLTAREN  Apply 2 g topically daily as needed (For muscle pain.). Reported on 12/26/2015     multivitamin with minerals Tabs tablet  Take 1 tablet by mouth daily. Reported on 12/26/2015     MUSCLE RUB 10-15 % Crea  Apply 1 application topically as needed for muscle pain. Reported on 12/26/2015     naproxen 500 MG tablet  Commonly known as:  NAPROSYN  Take 500 mg by mouth 2 (two) times daily with a meal. Reported on 12/26/2015     ofloxacin 0.3 % ophthalmic solution  Commonly known as:  OCUFLOX  Place 2 drops into the left eye daily. Reported on 12/26/2015     promethazine 25 MG tablet  Commonly known as:  PHENERGAN  Take 25 mg by mouth every 6 (six) hours as needed for nausea or vomiting. Reported on 12/26/2015     ROLAIDS PO  Take 1-2 tablets by mouth 4 (four) times daily as needed (For indigestion.). Reported on 12/26/2015     simethicone 125 MG chewable tablet  Commonly known as:  MYLICON  Chew 0000000 mg by mouth every 6 (six) hours as needed for flatulence. Reported on 12/26/2015     SOOTHE HYDRATION OP  Place 1 Dose into the left eye 2 (two) times daily. Reported on 12/26/2015     traMADol 50 MG tablet  Commonly known as:  ULTRAM  Take 1 tablet (50 mg total) by mouth every 8 (eight) hours as needed for moderate pain. Reported on 12/26/2015     traMADol 50 MG tablet  Commonly known as:  ULTRAM  Take 1 tablet (50 mg total) by mouth every 6 (six) hours as needed for moderate pain.         SignedTerrance Mass 12/28/2015, 9:00 AM

## 2015-12-28 NOTE — Progress Notes (Signed)
Ambulated  Out teaching complete   Pt to go get prescription at drug store  [place] in from office

## 2015-12-30 ENCOUNTER — Encounter (HOSPITAL_COMMUNITY): Payer: Self-pay | Admitting: Gynecology

## 2016-01-18 ENCOUNTER — Ambulatory Visit: Payer: Medicare Other | Admitting: Gynecology

## 2016-01-18 ENCOUNTER — Ambulatory Visit (INDEPENDENT_AMBULATORY_CARE_PROVIDER_SITE_OTHER): Payer: Self-pay | Admitting: Gynecology

## 2016-01-18 ENCOUNTER — Encounter: Payer: Self-pay | Admitting: Gynecology

## 2016-01-18 VITALS — BP 128/86

## 2016-01-18 DIAGNOSIS — Z87410 Personal history of cervical dysplasia: Secondary | ICD-10-CM | POA: Insufficient documentation

## 2016-01-18 DIAGNOSIS — Z09 Encounter for follow-up examination after completed treatment for conditions other than malignant neoplasm: Secondary | ICD-10-CM

## 2016-01-18 HISTORY — DX: Personal history of cervical dysplasia: Z87.410

## 2016-01-18 MED ORDER — FLUCONAZOLE 150 MG PO TABS: 150.0000 mg | ORAL_TABLET | Freq: Once | ORAL | 0 refills | Status: AC

## 2016-01-18 MED ORDER — CLINDAMYCIN PHOSPHATE 2 % VA CREA: 1.0000 | TOPICAL_CREAM | Freq: Every day | VAGINAL | 0 refills | Status: DC

## 2016-01-18 NOTE — Progress Notes (Signed)
   Patient is a 55 year old that presented to the office today for her 3 week postop visit. On July 18 patient underwent a resectoscopic polypectomy and myomectomy along with LEEP cervical conization as a result of CIN-1 and CIN-2. Patient is done well since her surgery some brownish discharge reported. Pictures from surgery as well as the following pathology report was shared with the patient:  Pathology report: Diagnosis 1. 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 Cervix, LEEP, button BENIGN CERVICAL SQUAMOUS MUCOSA AND ENDOCERVICAL GLANDS NO EVIDENCE OF INTRAEPITHELIAL DYSPLASIA MARGINS ARE NEGATIVE FOR DYSPLASIA  Exam: Blood pressure 128/86 Abdomen: Soft nontender no rebound or guarding Pelvic: Bartholin urethra Skene was within normal limits Vagina: No lesions or discharge Cervix: Cervical bed friable 70% healed Bimanual exam: Not done Rectal exam: Not done  Assessment/plan: Patient status post resectoscopic polypectomy/myomectomy as a result of dysfunction uterine bleeding and also LEEP cervical conization as a result of CIN-1 CIN-2 has done well from her surgery pathology report as described above. Patient was instructed to return back to the office in 4 weeks for final postop visit. She felt some slight follow her pruritus I'm going to prescribe her Diflucan 150 mg one by mouth today. The cervical bed was cauterized with silver nitrate and Monsel solution. Have asked her to apply intravaginally Cleocin vaginal cream at that time starting next week for 5-7 days. Patient to refrain from intercourse and only shallow pass until her final postop visit in one month.

## 2016-02-09 ENCOUNTER — Ambulatory Visit: Payer: Self-pay | Admitting: Gynecology

## 2016-02-14 ENCOUNTER — Ambulatory Visit: Payer: Self-pay | Admitting: Gynecology

## 2016-02-14 DIAGNOSIS — Z0289 Encounter for other administrative examinations: Secondary | ICD-10-CM

## 2016-02-21 ENCOUNTER — Ambulatory Visit (INDEPENDENT_AMBULATORY_CARE_PROVIDER_SITE_OTHER): Payer: Self-pay | Admitting: Internal Medicine

## 2016-02-21 VITALS — BP 126/82 | HR 76 | Temp 98.5°F | Wt 225.2 lb

## 2016-02-21 DIAGNOSIS — M791 Myalgia, unspecified site: Secondary | ICD-10-CM

## 2016-02-21 DIAGNOSIS — N390 Urinary tract infection, site not specified: Secondary | ICD-10-CM

## 2016-02-21 DIAGNOSIS — H9222 Otorrhagia, left ear: Secondary | ICD-10-CM

## 2016-02-21 DIAGNOSIS — Z23 Encounter for immunization: Secondary | ICD-10-CM

## 2016-02-21 DIAGNOSIS — M545 Low back pain, unspecified: Secondary | ICD-10-CM

## 2016-02-21 DIAGNOSIS — R399 Unspecified symptoms and signs involving the genitourinary system: Secondary | ICD-10-CM

## 2016-02-21 DIAGNOSIS — M797 Fibromyalgia: Secondary | ICD-10-CM

## 2016-02-21 LAB — POCT URINALYSIS DIPSTICK
Bilirubin, UA: NEGATIVE
Glucose, UA: NEGATIVE
Leukocytes, UA: NEGATIVE
Nitrite, UA: NEGATIVE
Protein, UA: NEGATIVE
Spec Grav, UA: 1.02
Urobilinogen, UA: 0.2
pH, UA: 6

## 2016-02-21 LAB — POCT UA - MICROSCOPIC ONLY

## 2016-02-21 MED ORDER — DICLOFENAC SODIUM 1 % TD GEL: 2.0000 g | Freq: Every day | TRANSDERMAL | 2 refills | Status: DC | PRN

## 2016-02-21 NOTE — Progress Notes (Signed)
[facility] Progress Note  Subjective:  Alyssa Montgomery is a 55-y/o female who presents for SDA due to bleeding from L ear and follow-up of UTI.  L ear bleeding: - Began Saturday. Patient says she had sensation of fullness and urge to check her ear. She inserted a q-tip and tip was covered in dried blood. - Since then has had bright red blood oozing out of ear - Denies pain but says sometimes has sensation ear is "filled up" - Denies regularly using q-tips/inserting objects into ear - Has never happened to her before ROS: No nausea, no dizziness, no tinnitus  Recent UTI: - Patient says she was treated for UTI recently. Last availabe UA from June shows only yeast. Was given diflucan for itching in August at post-op visit after LEEP and polypectomy and myomectomy performed in July. Urine culture with inadequate growth at that time.  - Does have UA with evidence of UTI from March 2017 from ED visit; treated with cipro - Denies increased urinary frequency or urgency but sometimes has sensation of pressure when she is urinating - Has lower back pain she worries could be kidney infection - No increased vaginal discharge - Would like to know that urine is clear by giving a sample ROS: No abdominal pain, no fever  Fibromyalgia: - Requesting diclofenac gel for muscle aching in shoulders  Social: Current smoker  Objective: Blood pressure 126/82, pulse 76, temperature 98.5 F (36.9 C), temperature source Oral, weight 225 lb 3.2 oz (102.2 kg), SpO2 100 %. Constitutional: Obese female, in NAD HENT: TMs intact bilaterally. Minimal amount of bright red blood anterior to L TM.  Cardiovascular: RRR, S1, S2, no m/r/g.  MSK: FROM at shoulders. TTP over lower paraspinal muscles. No CVA tenderness.   Abdominal: Soft. +BS, NT, ND, no rebound or guarding.  Psychiatric: Normal mood and anxious affect.  Vitals reviewed  Assessment/Plan: Bleeding from left ear - Ear drum intact. Suspect  secondary to instrumentation - Advised patient not to put anything in her ears for new few days - Recommended not using q-tips - If still having bleeding by end of week, would consider ENT referral  UTI symptoms - Has sensation of pressure with urination. Checked UA, which showed blood but add-on with only rare RBCs. - Informed patient she does not have evidence of UTI  Fibromyalgia - Refilled diclofenac gel  Follow-up if symptoms do not improve.  Olene Floss, MD [place], PGY-2

## 2016-02-21 NOTE — Patient Instructions (Signed)
Ms. Gygi,  Your eardrum looks intact. Do not place anything in your ear for the next few days. If you continue to have bleeding after that time, call clinic, and I will place referral to specialist.  I will call you with results of urine test.  Best, Dr. Ola Spurr

## 2016-02-23 ENCOUNTER — Encounter: Payer: Self-pay | Admitting: Internal Medicine

## 2016-02-23 DIAGNOSIS — R399 Unspecified symptoms and signs involving the genitourinary system: Secondary | ICD-10-CM | POA: Insufficient documentation

## 2016-02-23 DIAGNOSIS — H9222 Otorrhagia, left ear: Secondary | ICD-10-CM | POA: Insufficient documentation

## 2016-02-23 NOTE — Assessment & Plan Note (Signed)
-   Refilled diclofenac gel

## 2016-02-23 NOTE — Assessment & Plan Note (Signed)
-   Has sensation of pressure with urination. Checked UA, which showed blood but add-on with only rare RBCs. - Informed patient she does not have evidence of UTI

## 2016-02-23 NOTE — Assessment & Plan Note (Signed)
-   Ear drum intact. Suspect secondary to instrumentation - Advised patient not to put anything in her ears for new few days - Recommended not using q-tips - If still having bleeding by end of week, would consider ENT referral

## 2016-06-12 ENCOUNTER — Ambulatory Visit: Payer: Self-pay | Admitting: Gynecology

## 2016-06-19 ENCOUNTER — Ambulatory Visit (HOSPITAL_COMMUNITY)
Admission: EM | Admit: 2016-06-19 | Discharge: 2016-06-19 | Disposition: A | Discharge status: Home / Self Care | Payer: Worker's Compensation | Attending: Family Medicine | Admitting: Family Medicine

## 2016-06-19 ENCOUNTER — Encounter (HOSPITAL_COMMUNITY): Payer: Self-pay | Admitting: *Deleted

## 2016-06-19 ENCOUNTER — Ambulatory Visit: Payer: Self-pay | Admitting: Gynecology

## 2016-06-19 DIAGNOSIS — M5441 Lumbago with sciatica, right side: Secondary | ICD-10-CM | POA: Diagnosis not present

## 2016-06-19 MED ORDER — KETOROLAC TROMETHAMINE 60 MG/2ML IM SOLN
INTRAMUSCULAR | Status: AC
  Filled 2016-06-19: qty 2

## 2016-06-19 MED ORDER — KETOROLAC TROMETHAMINE 60 MG/2ML IM SOLN
60.0000 mg | Freq: Once | INTRAMUSCULAR | Status: AC
  Administered 2016-06-19: 60 mg via INTRAMUSCULAR

## 2016-06-19 MED ORDER — DICLOFENAC SODIUM 75 MG PO TBEC: 75.0000 mg | DELAYED_RELEASE_TABLET | Freq: Two times a day (BID) | ORAL | 0 refills | Status: DC

## 2016-06-19 MED ORDER — METHYLPREDNISOLONE ACETATE 80 MG/ML IJ SUSP
80.0000 mg | Freq: Once | INTRAMUSCULAR | Status: AC
  Administered 2016-06-19: 80 mg via INTRAMUSCULAR

## 2016-06-19 MED ORDER — CYCLOBENZAPRINE HCL 10 MG PO TABS: 10.0000 mg | ORAL_TABLET | Freq: Two times a day (BID) | ORAL | 0 refills | Status: DC | PRN

## 2016-06-19 MED ORDER — METHYLPREDNISOLONE ACETATE 80 MG/ML IJ SUSP
INTRAMUSCULAR | Status: AC
  Filled 2016-06-19: qty 1

## 2016-06-19 NOTE — ED Triage Notes (Signed)
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 Patient states during this her back "snapped" patient states pain radiates down right side, states pain radiates towards front as well. Patient states no pain relief with home medications. Patient points to lower back.

## 2016-06-19 NOTE — ED Provider Notes (Signed)
CSN: [id number]     Arrival date & time 06/19/16  1721 History   First MD Initiated Contact with Patient 06/19/16 1904     Chief Complaint  Patient presents with  . Back Pain   (Consider location/radiation/quality/duration/timing/severity/associated sxs/prior Treatment) 56 year old female presents to clinic with chief complaint of lower back pain states the pain started yesterday at work. 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 During this she felt a "pop" in her back and has been having significant pain since. She reports her pain is sharp and stabbing, radiating to her right leg. She has been managing her pain at home with Tylenol with minimal success.    The history is provided by the patient.    Past Medical History:  Diagnosis Date  . Anxiety   . Bronchitis   . Diverticulitis   . Fibromyalgia   . GERD (gastroesophageal reflux disease)   . Headache   . Substance abuse    10 years ago ( cocaine)   Past Surgical History:  Procedure Laterality Date  . ANKLE SURGERY    . BACK SURGERY    . Bowel obstruction    . CERVICAL CONIZATION W/BX N/A 12/27/2015   Procedure: CONIZATION CERVIX WITH BIOPSY;  Surgeon: Terrance Mass, MD;  Location: [facility];  Service: Gynecology;  Laterality: N/A;  . DILATATION & CURETTAGE/HYSTEROSCOPY WITH MYOSURE N/A 12/27/2015   Procedure: DILATATION & CURETTAGE/HYSTEROSCOPY WITH MYOSURE;  Surgeon: Terrance Mass, MD;  Location: [facility];  Service: Gynecology;  Laterality: N/A;  . ECTOPIC PREGNANCY SURGERY    . EYE SURGERY     removed right eye;    Family History  Problem Relation Age of Onset  . Cancer Father     lung  . Hypertension Mother   . Arthritis Mother    Social History  Substance Use Topics  . Smoking status: Current Every Day Smoker    Packs/day: 0.50    Years: 18.00   Types: Cigarettes  . Smokeless tobacco: Never Used     Comment: 1-800 number given   . Alcohol use 0.6 oz/week    1 Glasses of wine per week     Comment: OCCASIONAL   OB History    Gravida Para Term Preterm AB Living   6 2 2   4 2    SAB TAB Ectopic Multiple Live Births   2 1 1          Review of Systems  Respiratory: Negative.   Cardiovascular: Negative.   Gastrointestinal: Negative.   Musculoskeletal: Positive for back pain. Negative for gait problem, neck pain and neck stiffness.  Neurological: Negative.   All other systems reviewed and are negative.   Allergies  Penicillins and Sulfonamide derivatives  Home Medications   Prior to Admission medications   Medication Sig Start Date End Date Taking? Authorizing Provider  acetaminophen (TYLENOL) 325 MG tablet Take 650-975 mg by mouth 2 (two) times daily as needed for mild pain.   Yes Historical Provider, MD  Artificial Tear Solution (SOOTHE HYDRATION OP) Place 1 Dose into the left eye 2 (two) times daily. Reported on 12/26/2015   Yes Historical Provider, MD  Menthol-Methyl Salicylate (MUSCLE RUB) 10-15 % CREA Apply 1 application topically as needed for muscle pain. Reported on 12/26/2015  Yes Historical Provider, MD  traMADol (ULTRAM) 50 MG tablet Take 1 tablet (50 mg total) by mouth every 6 (six) hours as needed for moderate pain. 12/28/15  Yes Terrance Mass, MD  albuterol (PROVENTIL HFA;VENTOLIN HFA) 108 (90 BASE) MCG/ACT inhaler Inhale 2 puffs into the lungs every 6 (six) hours as needed for wheezing. 04/15/15 04/14/16  Ajo N Rumley, DO  bismuth subsalicylate (PEPTO BISMOL) 262 MG/15ML suspension Take 30 mLs by mouth every 6 (six) hours as needed for indigestion.    Historical Provider, MD  Ca Carbonate-Mag Hydroxide (ROLAIDS PO) Take 1-2 tablets by mouth 4 (four) times daily as needed (For indigestion.). Reported on 12/26/2015    Historical Provider, MD  Cholecalciferol (D3-1000) 1000 units tablet Take 1,000 Units by mouth  daily. Reported on 12/26/2015    Historical Provider, MD  clindamycin (CLEOCIN) 2 % vaginal cream Place 1 Applicatorful vaginally at bedtime. 01/18/16   Terrance Mass, MD  Coenzyme Q10 (COQ-10) 200 MG CAPS Take 1 capsule by mouth daily. Reported on 12/26/2015    Historical Provider, MD  cyclobenzaprine (FLEXERIL) 10 MG tablet Take 1 tablet (10 mg total) by mouth 2 (two) times daily as needed for muscle spasms. 06/19/16   Barnet Glasgow, NP  cyclobenzaprine (FLEXERIL) 5 MG tablet Take 5 mg by mouth 3 (three) times daily as needed for muscle spasms. Reported on 12/26/2015    Historical Provider, MD  diclofenac (VOLTAREN) 75 MG EC tablet Take 1 tablet (75 mg total) by mouth 2 (two) times daily. 06/19/16   Barnet Glasgow, NP  diclofenac sodium (VOLTAREN) 1 % GEL Apply 2 g topically daily as needed (For muscle pain.). Reported on 12/26/2015 02/21/16   Rogue Bussing, MD  Multiple Vitamin (MULTIVITAMIN WITH MINERALS) TABS tablet Take 1 tablet by mouth daily. Reported on 12/26/2015    Historical Provider, MD  naproxen (NAPROSYN) 500 MG tablet Take 500 mg by mouth 2 (two) times daily with a meal. Reported on 12/26/2015    Historical Provider, MD  ofloxacin (OCUFLOX) 0.3 % ophthalmic solution Place 2 drops into the left eye daily. Reported on 12/26/2015    Historical Provider, MD  promethazine (PHENERGAN) 25 MG tablet Take 25 mg by mouth every 6 (six) hours as needed for nausea or vomiting. Reported on 12/26/2015    Historical Provider, MD  simethicone (MYLICON) 0000000 MG chewable tablet Chew 125 mg by mouth every 6 (six) hours as needed for flatulence. Reported on 12/26/2015    Historical Provider, MD  traMADol (ULTRAM) 50 MG tablet Take 1 tablet (50 mg total) by mouth every 8 (eight) hours as needed for moderate pain. Reported on 12/26/2015 12/26/15   Terrance Mass, MD   Meds Ordered and Administered this Visit   Medications  ketorolac (TORADOL) injection 60 mg (not administered)  methylPREDNISolone acetate  (DEPO-MEDROL) injection 80 mg (not administered)    There were no vitals taken for this visit. No data found.   Physical Exam  Constitutional: She is oriented to person, place, and time. She appears well-developed and well-nourished. No distress.  Eyes: Pupils are equal, round, and reactive to light.  Cardiovascular: Normal rate and regular rhythm.   Pulmonary/Chest: Effort normal and breath sounds normal.  Abdominal: Soft. Bowel sounds are normal.  Musculoskeletal:       Lumbar back: She exhibits tenderness, pain and spasm. She exhibits no bony tenderness, no swelling, no edema and no deformity.       Back:  Neurological: She is alert and oriented to  person, place, and time.  Skin: Skin is warm and dry. Capillary refill takes less than 2 seconds. She is not diaphoretic.  Psychiatric: She has a normal mood and affect.  Nursing note and vitals reviewed.   Urgent Care Course   Clinical Course     Procedures (including critical care time)  Labs Review Labs Reviewed - No data to display  Imaging Review No results found.   Visual Acuity Review  Right Eye Distance:   Left Eye Distance:   Bilateral Distance:    Right Eye Near:   Left Eye Near:    Bilateral Near:         MDM   1. Acute bilateral low back pain with right-sided sciatica   You have a lower back injury. As this happened at work, follow up with occupational health in the "Black box" at 8 am in the morning for further evaluation and management. You have been given a prescription for flexeril here for muscle spasm and diclofenac for pain. If you choose to take any over the counter Tylenol, please limit to no more than 3,000 mg per day. Occupational health will handle follow up care and management.     Barnet Glasgow, NP 06/19/16 2213

## 2016-06-19 NOTE — Discharge Instructions (Addendum)
You have a lower back injury. As this happened at work, follow up with occupational health in the "Black box" at 8 am in the morning for further evaluation and management. You have been given a prescription for flexeril here for muscle spasm and diclofenac for pain. If you choose to take any over the counter Tylenol, please limit to no more than 3,000 mg per day. Occupational health will handle follow up care and management.

## 2016-07-03 ENCOUNTER — Ambulatory Visit
Admission: RE | Admit: 2016-07-03 | Discharge: 2016-07-03 | Disposition: A | Discharge status: Home / Self Care | Payer: Worker's Compensation | Source: Ambulatory Visit | Attending: Family Medicine | Admitting: Family Medicine

## 2016-07-03 ENCOUNTER — Other Ambulatory Visit: Payer: Self-pay | Admitting: Family Medicine

## 2016-07-03 DIAGNOSIS — S39012A Strain of muscle, fascia and tendon of lower back, initial encounter: Secondary | ICD-10-CM

## 2016-07-03 DIAGNOSIS — R202 Paresthesia of skin: Secondary | ICD-10-CM

## 2016-07-12 ENCOUNTER — Ambulatory Visit (INDEPENDENT_AMBULATORY_CARE_PROVIDER_SITE_OTHER): Payer: Medicaid Other | Admitting: Orthopaedic Surgery

## 2016-07-18 ENCOUNTER — Ambulatory Visit (INDEPENDENT_AMBULATORY_CARE_PROVIDER_SITE_OTHER): Payer: Worker's Compensation | Admitting: Orthopaedic Surgery

## 2016-07-18 DIAGNOSIS — M5442 Lumbago with sciatica, left side: Secondary | ICD-10-CM

## 2016-07-18 MED ORDER — TIZANIDINE HCL 4 MG PO TABS: 4.0000 mg | ORAL_TABLET | Freq: Three times a day (TID) | ORAL | 0 refills | Status: DC | PRN

## 2016-07-18 MED ORDER — METHYLPREDNISOLONE 4 MG PO TABS: ORAL_TABLET | ORAL | 0 refills | Status: DC

## 2016-07-18 NOTE — Progress Notes (Signed)
The patient is a 56 year old is seen Dr. Lorin Mercy remotely in the past. She is referred from [facility] due to a back injury. 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 Alyssa Montgomery's back. She gets pain in her back is rating down her left backside always down to her foot. She is just taking some Tylenol to help with inflammation. She is on modified work duties with mainly sedentary type for just watching patients from a sitting position. She denies any injuries prior to this. She's had a remote history of back surgery would set her back was not bothering her until this recent injury. She says that she did not tell in her shoulder was hurting but now her right shoulders been hurting her as well. She denies any change in bowel or bladder function does report some numbness in her right foot area she's not been to any type of physical therapy is only taken Tylenol right now. He currently denies any headache, chest pain, short of breath, fever, chills, nausea, vomiting.  On exam she gets up on the exam table easily and and placed around the room easily and does not appear to be any significant distress. She got excellent passive and active range of motion of her right shoulder but she winces in pain with examination of right shoulder rotator cuff is intact. Her neck exam shows pain with motion but is otherwise normal exam. She's got excellent flexion extension her lumbar spine with pain. She has pain out of proportion of exam at just palpation of the back to the left side. She's seems to have a positive straight leg raise a left side. Is really hard to get a good exam from her.  X-rays on the cone system her lumbar spine show some mild degenerative changes and no gross acute findings other than loss of her lumbar lordosis which can be indicative of pain.  At this point on to put her through physical therapy  for her back in a work on her neck and shoulders well. Certainly these can be related to her original injury. I will try six-day steroid taper and some Zanaflex as well. We'll keep her on modified work duties with no lifting greater than 10 pounds. I like to see her back for repeat evaluation 4 weeks.

## 2016-07-31 ENCOUNTER — Telehealth (INDEPENDENT_AMBULATORY_CARE_PROVIDER_SITE_OTHER): Payer: Self-pay | Admitting: Orthopaedic Surgery

## 2016-07-31 NOTE — Telephone Encounter (Signed)
Can you help me with this?

## 2016-07-31 NOTE — Telephone Encounter (Signed)
Patient calling re Physical Therapy orders.  Her injury was WC related.  She is asking if this has to be approved and is this why it's taking so long?  Please advise.  Patient is on modified duty and is only working 2 hours a day.

## 2016-08-01 NOTE — Telephone Encounter (Signed)
Will give note for PT.

## 2016-08-01 NOTE — Telephone Encounter (Signed)
""  Patient wants you to know she is in pain, and its causing her more trauma to drive to work for 45 min, sitting around for 2 hours, then driving back home. She states she isnt able to take medication during this time. Either she needs to come out of work or go on to physical therapy because this isnt working for her. shes under distress going back and forth. shes spending 60 something dollars for medication she cant even use.""  Cb#: [phone]

## 2016-08-01 NOTE — Telephone Encounter (Signed)
LM with patient letting her know that we are working on this as quick as we can

## 2016-08-01 NOTE — Telephone Encounter (Signed)
Can we get another PT script for her please?

## 2016-08-07 ENCOUNTER — Telehealth (INDEPENDENT_AMBULATORY_CARE_PROVIDER_SITE_OTHER): Payer: Self-pay | Admitting: Orthopaedic Surgery

## 2016-08-07 NOTE — Telephone Encounter (Signed)
Judson Roch from [facility] called needing to know when the patient will start Physical Therapy. CB # [phone]

## 2016-08-07 NOTE — Telephone Encounter (Signed)
See below

## 2016-08-09 ENCOUNTER — Emergency Department: Admit: 2016-08-09 | Payer: MEDICARE | Primary: Family Medicine

## 2016-08-09 ENCOUNTER — Inpatient Hospital Stay
Admit: 2016-08-09 | Discharge: 2016-08-10 | Disposition: A | Discharge status: Home / Self Care | Payer: MEDICARE | Attending: Emergency Medicine

## 2016-08-09 DIAGNOSIS — R51 Headache: Secondary | ICD-10-CM | POA: Diagnosis not present

## 2016-08-09 DIAGNOSIS — F17200 Nicotine dependence, unspecified, uncomplicated: Secondary | ICD-10-CM | POA: Diagnosis not present

## 2016-08-09 DIAGNOSIS — E785 Hyperlipidemia, unspecified: Secondary | ICD-10-CM | POA: Diagnosis not present

## 2016-08-09 DIAGNOSIS — R531 Weakness: Secondary | ICD-10-CM | POA: Diagnosis not present

## 2016-08-09 DIAGNOSIS — R29818 Other symptoms and signs involving the nervous system: Secondary | ICD-10-CM | POA: Diagnosis not present

## 2016-08-09 DIAGNOSIS — R55 Syncope and collapse: Secondary | ICD-10-CM | POA: Insufficient documentation

## 2016-08-09 DIAGNOSIS — R5383 Other fatigue: Secondary | ICD-10-CM | POA: Diagnosis not present

## 2016-08-09 DIAGNOSIS — M25511 Pain in right shoulder: Secondary | ICD-10-CM | POA: Diagnosis not present

## 2016-08-09 DIAGNOSIS — R42 Dizziness and giddiness: Secondary | ICD-10-CM | POA: Diagnosis not present

## 2016-08-09 DIAGNOSIS — Z720 Tobacco use: Secondary | ICD-10-CM | POA: Diagnosis not present

## 2016-08-09 DIAGNOSIS — Z800 Family history of malignant neoplasm of digestive organs: Secondary | ICD-10-CM | POA: Diagnosis not present

## 2016-08-09 DIAGNOSIS — Z79899 Other long term (current) drug therapy: Secondary | ICD-10-CM | POA: Diagnosis not present

## 2016-08-09 DIAGNOSIS — R0789 Other chest pain: Secondary | ICD-10-CM | POA: Diagnosis not present

## 2016-08-09 DIAGNOSIS — R079 Chest pain, unspecified: Secondary | ICD-10-CM | POA: Diagnosis not present

## 2016-08-09 DIAGNOSIS — J329 Chronic sinusitis, unspecified: Secondary | ICD-10-CM | POA: Diagnosis not present

## 2016-08-09 DIAGNOSIS — I959 Hypotension, unspecified: Secondary | ICD-10-CM | POA: Diagnosis not present

## 2016-08-09 DIAGNOSIS — R110 Nausea: Secondary | ICD-10-CM | POA: Diagnosis not present

## 2016-08-09 DIAGNOSIS — M47816 Spondylosis without myelopathy or radiculopathy, lumbar region: Secondary | ICD-10-CM | POA: Diagnosis not present

## 2016-08-09 LAB — CBC WITH AUTOMATED DIFF
ABS. BASOPHILS: 0 10*3/uL (ref 0.0–0.06)
ABS. EOSINOPHILS: 0.1 10*3/uL (ref 0.0–0.4)
ABS. LYMPHOCYTES: 2.1 10*3/uL (ref 0.9–3.6)
ABS. MONOCYTES: 0.6 10*3/uL (ref 0.05–1.2)
ABS. NEUTROPHILS: 4.3 10*3/uL (ref 1.8–8.0)
BASOPHILS: 0 % (ref 0–2)
EOSINOPHILS: 1 % (ref 0–5)
HCT: 39.5 % (ref 35.0–45.0)
HGB: 12.9 g/dL (ref 12.0–16.0)
LYMPHOCYTES: 30 % (ref 21–52)
MCH: 26.6 PG (ref 24.0–34.0)
MCHC: 32.7 g/dL (ref 31.0–37.0)
MCV: 81.4 FL (ref 74.0–97.0)
MONOCYTES: 9 % (ref 3–10)
MPV: 10.4 FL (ref 9.2–11.8)
NEUTROPHILS: 60 % (ref 40–73)
PLATELET: 235 10*3/uL (ref 135–420)
RBC: 4.85 M/uL (ref 4.20–5.30)
RDW: 15.1 % — ABNORMAL HIGH (ref 11.6–14.5)
WBC: 7.1 10*3/uL (ref 4.6–13.2)

## 2016-08-09 LAB — METABOLIC PANEL, COMPREHENSIVE
A-G Ratio: 0.9 (ref 0.8–1.7)
ALT (SGPT): 32 U/L (ref 13–56)
AST (SGOT): 23 U/L (ref 15–37)
Albumin: 3.4 g/dL (ref 3.4–5.0)
Alk. phosphatase: 122 U/L — ABNORMAL HIGH (ref 45–117)
Anion gap: 6 mmol/L (ref 3.0–18)
BUN/Creatinine ratio: 15 (ref 12–20)
BUN: 14 MG/DL (ref 7.0–18)
Bilirubin, total: 0.3 MG/DL (ref 0.2–1.0)
CO2: 27 mmol/L (ref 21–32)
Calcium: 8.6 MG/DL (ref 8.5–10.1)
Chloride: 106 mmol/L (ref 100–108)
Creatinine: 0.91 MG/DL (ref 0.6–1.3)
GFR est AA: >60 mL/min/{1.73_m2} (ref >60)
GFR est non-AA: >60 mL/min/{1.73_m2} (ref >60)
Globulin: 4 g/dL (ref 2.0–4.0)
Glucose: 143 mg/dL — ABNORMAL HIGH (ref 74–99)
Potassium: 3.8 mmol/L (ref 3.5–5.5)
Protein, total: 7.4 g/dL (ref 6.4–8.2)
Sodium: 139 mmol/L (ref 136–145)

## 2016-08-09 LAB — PROTHROMBIN TIME + INR
INR: 1 (ref 0.8–1.2)
Prothrombin time: 12.4 s (ref 11.5–15.2)

## 2016-08-09 LAB — MAGNESIUM: Magnesium: 2.4 mg/dL (ref 1.6–2.6)

## 2016-08-09 LAB — TROPONIN I: Troponin-I, QT: <0.02 NG/ML (ref 0.0–0.045)

## 2016-08-09 LAB — LIPASE: Lipase: 111 U/L (ref 73–393)

## 2016-08-09 LAB — ETHYL ALCOHOL: ALCOHOL(ETHYL),SERUM: <3 MG/DL (ref 0–3)

## 2016-08-09 MED ORDER — SODIUM CHLORIDE 0.9 % IV
INTRAVENOUS | Status: DC
Start: 2016-08-09 — End: 2016-08-11
  Administered 2016-08-10: 03:00:00 via INTRAVENOUS

## 2016-08-09 MED ORDER — SODIUM CHLORIDE 0.9% BOLUS IV
0.9 % | Freq: Once | INTRAVENOUS | Status: AC
Start: 2016-08-09 — End: 2016-08-09
  Administered 2016-08-09 (×2): via INTRAVENOUS

## 2016-08-09 MED FILL — SODIUM CHLORIDE 0.9 % IV: INTRAVENOUS | Qty: 1000

## 2016-08-09 NOTE — ED Notes (Signed)
7:54 PM :Pt care assumed from Dr. Janey GreaserSalomonsky , ED provider. Pt complaint(s), current treatment plan, progression and available diagnostic results have been discussed thoroughly.  Rounding occurred: yes  Intended Disposition: TBD, Patronys evaluation/ recommendations needed    Pending diagnostic reports and/or labs (please list): UA      Attestations:  Scribe Attestation     McGraw-HillShennel J Green acting as a Neurosurgeonscribe for and in the presence of Allayne Butcherraig A Burke Terry, MD      August 09, 2016 at 7:56 PM       Provider Attestation:      I personally performed the services described in the documentation, reviewed the documentation, as recorded by the scribe in my presence, and it accurately and completely records my words and actions. August 09, 2016 at 7:56 PM - Allayne Butcherraig A Zaiden Ludlum, MD

## 2016-08-09 NOTE — ED Triage Notes (Signed)
The patient presents for evaluation of dizziness that began suddenly while eating lunch today.

## 2016-08-09 NOTE — ED Triage Notes (Signed)
Pt was brought to ED for syncopal episode while eating. Pt states she passed out while eating at a restaurant. Denies SOB and Chest Pain.   BG: 199  94/66 BP upon medic arrival  250 ml of NS adminstered  BP brought up to 114/72  Muscle relaxer taken today for chronic back pain.  22g L hand

## 2016-08-09 NOTE — ED Triage Notes (Signed)
The patient presents for evaluation of dizziness and shortness of breath.

## 2016-08-09 NOTE — ED Notes (Signed)
Attempted to obtain an IV access to left AC, unsuccesesful.

## 2016-08-09 NOTE — ED Notes (Signed)
Report given to Chris RN. No question or concerns after report

## 2016-08-09 NOTE — H&P (Signed)
History & Physical    Patient: Jodi [name] MRN: [id number]  CSN: [id number]    Date of Birth: [birth year]  Age: 56 y.o.  Sex: female      DOA: 08/09/2016    Chief Complaint:   Chief Complaint   Patient presents with ??? Syncope          HPI:     Jodi [name] is a 56 y.o. 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   Pt came in with a CC of unsteady gate, Dizziness, nausea and near syncopal episode after having dinner tonight. Pt also c/o chest heaviness at that time.  Pt froze for about a minute and could not move.  Pt states that she was having on and off headaches for the last 3 weeks and was suffering from what she describes as sinus pain in her forehead   ??? Syncope          HPI:     Jodi [name] is a 56 y.o. 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   Pt came in with a CC of unsteady gate, Dizziness, nausea and near syncopal episode after having dinner tonight. Pt also c/o chest heaviness at that time.  Pt froze for about a minute and could not move.  Pt states that she was having on and off headaches for the last 3 weeks and was suffering from what she describes as sinus pain in her forehead    On Physical exam, pt had LLE weakness and numbness.  Denies chest pain, SOB  But feels generally weak.        History reviewed. No pertinent past medical history.    History reviewed. No pertinent surgical history.    History reviewed. No pertinent family history.    Social History     Social History   ??? Marital status: MARRIED     Spouse name: N/A   ??? Number of children: N/A   ??? Years of education: N/A     Social History Main Topics   ??? Smoking status: Never Smoker   ??? Smokeless tobacco: Never Used   ??? Alcohol use No   ??? Drug use: No   ??? Sexual activity: Not Asked     Other Topics Concern   ??? None     Social History Narrative   ??? None       Prior to Admission medications    Medication Sig Start Date End Date Taking? Authorizing Provider   diclofenac EC (VOLTAREN) 75 mg EC tablet Take 75 mg by mouth two (2) times a day.   Yes Historical Provider   tiZANidine (ZANAFLEX) 4 mg tablet Take 4 mg by mouth three (3) times daily.   Yes Historical Provider       Allergies   Allergen Reactions   ??? Penicillins Unknown (comments)         Review of Systems  GENERAL: Patient alert, awake and oriented times 3, able to communicate full sentences and not in distress.    HEENT: No change in vision, no earache, tinnitus, sore throat . +ve sinus congestion.   NECK: No pain or stiffness.   PULMONARY: No shortness of breath, cough or wheeze.   Cardiovascular: no pnd / orthopnea, no CP  GASTROINTESTINAL: No abdominal pain, nausea, vomiting or diarrhea, melena or bright red blood per rectum.   GENITOURINARY: No urinary frequency, urgency, hesitancy or dysuria.   MUSCULOSKELETAL: No joint or muscle pain, no back pain, no recent trauma.   DERMATOLOGIC: No rash, no itching, no lesions.   ENDOCRINE: No polyuria, polydipsia, no heat or cold intolerance. No recent change in weight.   HEMATOLOGICAL: No anemia or easy bruising or bleeding.   NEUROLOGIC: +ve headache, no seizures, numbness, +ve Lt LE weakness       Physical Exam:     Physical Exam:  Visit Vitals   ??? BP 100/63 (BP 1 Location: Left arm, BP Patient Position: At rest)   ??? Pulse 79   ???  Temp 97.5 ??F (36.4 ??C)   ??? Resp 18   ??? Wt 92.1 kg (203 lb)   ??? SpO2 95%           Temp (24hrs), Avg:97.6 ??F (36.4 ??C), Min:97.4 ??F (36.3 ??C), Max:98.1 ??F (36.7 ??C)             General:  Alert, cooperative, no distress, appears stated age.              Head: Normocephalic, without obvious abnormality, atraumatic.   Eyes:  Conjunctivae/corneas clear. PERRL, EOMs intact.   Nose: Nares normal. No drainage or sinus tenderness.   Neck: Supple, symmetrical, trachea midline, no adenopathy, thyroid: no enlargement, no carotid bruit and no JVD.   Lungs:   Clear to auscultation bilaterally.   Heart:  Regular rate and rhythm, S1, S2 normal.     Abdomen: Soft, non-tender. Bowel sounds normal.    Extremities: Extremities normal, atraumatic, no cyanosis or edema.   Pulses: 2+ and symmetric all extremities.   Skin:  No rashes or lesions   Neurologic: AAOx3, has Lt LE weakness with Decrease strength       Labs Reviewed:    All lab results for the last 24 hours reviewed.  CT and EKG    Procedures/imaging: see electronic medical records for all  procedures/Xrays and details which were not copied into this note but were reviewed prior to creation of Plan      Assessment/Plan     Principal Problem:    Near syncope (08/09/2016)    Active Problems:    TIA (transient ischemic attack) (08/10/2016)      Other chest pain (08/10/2016)      Tobacco abuse (08/10/2016)      Weakness generalized (08/10/2016)      Sinus infection (08/10/2016)     Pt is admitted for near syncopal episode r/o arrhythmia   Small symmetric bilateral basal ganglia infarcts r/o CVA/TIA  Sinus infection    Will Get an MRI/MRA as per Neur-tele rec.  ASA 325 mg     Echo in AM to r/o Wall abnormalities     Will start Statin   Zithromax Po for sinus infection    Tobacco abuse >> Nicotine patch    DVT/GI Prophylaxis: Lovenox    Plan of care is discussed in details with Patient/Family at bedside and agreed upon    Roe CoombsGamal S Makela Niehoff, MD  08/10/2016 11:40 PM

## 2016-08-09 NOTE — Other (Signed)
TRANSFER - OUT REPORT:    Verbal report given to Lulu, RN on Jodi [name]  being transferred to [facility] (unit) for routine progression of care       Report consisted of patient???s Situation, Background, Assessment and   Recommendations(SBAR).     Information from the following report(s) SBAR and ED Summary was reviewed with the receiving nurse.    Lines:       Opportunity for questions and clarification was provided.      Patient transported with:   [employer]

## 2016-08-09 NOTE — Other (Signed)
TRANSFER - IN REPORT:    Verbal [facility] received from [facility] RN(name) on Jodi [name]  being received from ED(unit) for routine progression of care      Report consisted of patient???s Situation, Background, Assessment and   Recommendations(SBAR).     Information from the following report(s) SBAR, Kardex and ED Summary was reviewed with the receiving nurse.    Opportunity for questions and clarification was provided.      Assessment completed upon patient???s arrival to unit and care assumed.

## 2016-08-09 NOTE — ED Provider Notes (Addendum)
EMERGENCY DEPARTMENT HISTORY AND PHYSICAL EXAM    4:00 PM      Date: 08/09/2016  Patient Name: Jodi [name]    History of Presenting Illness     Chief Complaint   Patient presents with   ??? Syncope         History Provided By: Patient    Chief Complaint: Near-Syncope  Duration:  Seconds  Timing:  Acute  Location: N/A  Quality: Lightheadedness  Severity: Moderate  Modifying Factors: None  Associated Symptoms: lightheadedness, nausea      Additional History (Context): Jodi [name] is a 56 y.o. female with a history of chronic back pain, who presents to the ED via EMS with complaint of near-syncope which occurred while eating at a local diner. Patient states that she was feeling fine this morning and throughout the day today; was able to complete her ADLs as usual. She states that she ate almost her entire meal at the [employer] when she suddenly began to feel generally weak, lightheaded, nauseous and overall "sick". Patient states that she took a muscle relaxer and a Tylenol earlier today. She makes no complaint of associated chest pain, abdominal pain, shortness of breath, back pain, headache, or vomiting.    PCP: Phys Other, MD        Past History     Past Medical History:  History reviewed. No pertinent past medical history.    Past Surgical History:  History reviewed. No pertinent surgical history.    Family History:  History reviewed. No pertinent family history.    Social History:  Social History   Substance Use Topics   ??? Smoking status: Never Smoker   ??? Smokeless tobacco: Never Used   ??? Alcohol use No       Allergies:  Allergies   Allergen Reactions   ??? Penicillins Unknown (comments)         Review of Systems       Review of Systems   Constitutional: Positive for fatigue (generalized weakness) and unexpected weight change. Negative for activity change and fever.   HENT: Negative for congestion and rhinorrhea.    Eyes: Negative for visual disturbance.   Respiratory: Negative for shortness of breath.     Cardiovascular: Negative for chest pain and palpitations.   Gastrointestinal: Positive for nausea. Negative for abdominal pain, diarrhea and vomiting.   Genitourinary: Negative for dysuria and hematuria.   Musculoskeletal: Negative for back pain.   Skin: Negative for rash.   Neurological: Positive for light-headedness. Negative for dizziness, speech difficulty, weakness, numbness and headaches.   All other systems reviewed and are negative.        Physical Exam     Visit Vitals   ??? BP 92/52 (BP 1 Location: Left arm, BP Patient Position: At rest;Supine)   ??? Pulse 64   ??? Resp 13   ??? SpO2 99%         Physical Exam   Constitutional: She is oriented to person, place, and time. She appears well-developed and well-nourished. No distress.   HENT:   Head: Normocephalic and atraumatic.   Right Ear: External ear normal.   Left Ear: External ear normal.   Nose: Nose normal.   Mouth/Throat: Oropharynx is clear and moist.   Eyes: Conjunctivae and EOM are normal. Pupils are equal, round, and reactive to light. No scleral icterus.   Neck: Normal range of motion. Neck supple. No JVD present. No tracheal deviation present. No thyromegaly present.   Cardiovascular: Normal rate, regular  rhythm, normal heart sounds and intact distal pulses.  Exam reveals no gallop and no friction rub.    No murmur heard.  Pulmonary/Chest: Effort normal and breath sounds normal. She exhibits no tenderness.   Mild rhonchi    Abdominal: Soft. Bowel sounds are normal. She exhibits no distension. There is no tenderness. There is no rebound and no guarding.   Musculoskeletal: Normal range of motion. She exhibits no edema or tenderness.   Lymphadenopathy:     She has no cervical adenopathy.   Neurological: She is alert and oriented to person, place, and time. No cranial nerve deficit. Coordination normal.   Vague numbness of B LE, mild global weakness   Skin: Skin is warm and dry.   Psychiatric: She has a normal mood and affect. Her behavior is normal.  Judgment and thought content normal.   Nursing note and vitals reviewed.        Diagnostic Study Results     Labs -  Recent Results (from the past 12 hour(s))   EKG, 12 LEAD, INITIAL    Collection Time: 08/09/16  4:10 PM   Result Value Ref Range    Ventricular Rate 67 BPM    Atrial Rate 67 BPM    P-R Interval 174 ms    QRS Duration 96 ms    Q-T Interval 440 ms    QTC Calculation (Bezet) 464 ms    Calculated P Axis 46 degrees    Calculated R Axis -24 degrees    Calculated T Axis -4 degrees    Diagnosis       Normal sinus rhythm  Moderate voltage criteria for LVH, may be normal variant  Nonspecific ST abnormality  Abnormal ECG  No previous ECGs available     CBC WITH AUTOMATED DIFF    Collection Time: 08/09/16  5:45 PM   Result Value Ref Range    WBC 7.1 4.6 - 13.2 K/uL    RBC 4.85 4.20 - 5.30 M/uL    HGB 12.9 12.0 - 16.0 g/dL    HCT 39.5 35.0 - 45.0 %    MCV 81.4 74.0 - 97.0 FL    MCH 26.6 24.0 - 34.0 PG    MCHC 32.7 31.0 - 37.0 g/dL    RDW 15.1 (H) 11.6 - 14.5 %    PLATELET 235 135 - 420 K/uL    MPV 10.4 9.2 - 11.8 FL    NEUTROPHILS 60 40 - 73 %    LYMPHOCYTES 30 21 - 52 %    MONOCYTES 9 3 - 10 %    EOSINOPHILS 1 0 - 5 %    BASOPHILS 0 0 - 2 %    ABS. NEUTROPHILS 4.3 1.8 - 8.0 K/UL    ABS. LYMPHOCYTES 2.1 0.9 - 3.6 K/UL    ABS. MONOCYTES 0.6 0.05 - 1.2 K/UL    ABS. EOSINOPHILS 0.1 0.0 - 0.4 K/UL    ABS. BASOPHILS 0.0 0.0 - 0.06 K/UL    DF AUTOMATED     METABOLIC PANEL, COMPREHENSIVE    Collection Time: 08/09/16  5:45 PM   Result Value Ref Range    Sodium 139 136 - 145 mmol/L    Potassium 3.8 3.5 - 5.5 mmol/L    Chloride 106 100 - 108 mmol/L    CO2 27 21 - 32 mmol/L    Anion gap 6 3.0 - 18 mmol/L    Glucose 143 (H) 74 - 99 mg/dL    BUN 14 7.0 - 18 MG/DL  Creatinine 0.91 0.6 - 1.3 MG/DL    BUN/Creatinine ratio 15 12 - 20      GFR est AA >60 >60 ml/min/1.58m    GFR est non-AA >60 >60 ml/min/1.741m   Calcium 8.6 8.5 - 10.1 MG/DL    Bilirubin, total 0.3 0.2 - 1.0 MG/DL    ALT (SGPT) 32 13 - 56 U/L     AST (SGOT) 23 15 - 37 U/L    Alk. phosphatase 122 (H) 45 - 117 U/L    Protein, total 7.4 6.4 - 8.2 g/dL    Albumin 3.4 3.4 - 5.0 g/dL    Globulin 4.0 2.0 - 4.0 g/dL    A-G Ratio 0.9 0.8 - 1.7     LIPASE    Collection Time: 08/09/16  5:45 PM   Result Value Ref Range    Lipase 111 73 - 393 U/L   MAGNESIUM    Collection Time: 08/09/16  5:45 PM   Result Value Ref Range    Magnesium 2.4 1.6 - 2.6 mg/dL   TROPONIN I    Collection Time: 08/09/16  5:45 PM   Result Value Ref Range    Troponin-I, Qt. <0.02 0.0 - 0.045 NG/ML   PROTHROMBIN TIME + INR    Collection Time: 08/09/16  5:45 PM   Result Value Ref Range    Prothrombin time 12.4 11.5 - 15.2 sec    INR 1.0 0.8 - 1.2     ETHYL ALCOHOL    Collection Time: 08/09/16  5:45 PM   Result Value Ref Range    ALCOHOL(ETHYL),SERUM <3 0 - 3 MG/DL       Radiologic Studies -   CT HEAD WO CONT   Final Result      XR CHEST SNGL V   Final Result      Radiologist's interpretation of Chest X-Ray (Read by Dr. ReKarl Luke  No acute pulmonary disease.     Radiologist's interpretation of CT Head (Read by Dr. ReKarl Luke  Small symmetric bilateral basal ganglia infarcts are age indeterminate. If  determination of acuity is desired, recommend MR brain.  Mild sinonasal mucosal disease.      Medical Decision Making   I am the first provider for this patient.    I reviewed the vital signs, available nursing notes, past medical history, past surgical history, family history and social history.    Vital Signs-Reviewed the patient's vital signs.    EKG:  Interpreted by the EP.   Time Interpreted: 4:10 PM   Rate: 67 bpm   Rhythm: Normal Sinus Rhythm   Interpretation: LVH. No STEMI.    Records Reviewed: Nursing Notes and Old Medical Records (Time of Review: 4:00 PM)    ED Course: Progress Notes, Reevaluation, and Consults:  Consult:  Discussed care with Dr. ZaOverton MamPatronus. Standard discussion; including history of patient???s chief complaint, available diagnostic  results, and treatment course. Will evaluate the patient.  6:54 PM, 08/09/2016      Dr. ZaOverton Mams working on his consult.  I signed out to Dr. DaMarguarite Arbouro follow the consult and informed the family of the plan. DaSallye OberDO 7:50 PM    8:01 PM Dr. ZaOverton Mamec admit for MRI, carotid [place]IG Atzel Mccambridge, MD  Provider Notes (Medical Decision Making): Pt is a [age]yoemale with a hx of chronic back pain that presents to the ED with complaint of feeling lightheaded while eating with near syncope. Pt recalls the entire event.  Pt admits to taking her  muscle relaxants today and does not take it daily.  Pt has some CP, dyspnea, numbness, making it difficult to localize a specific cause of the event.  Pt BP borderline so will hydrate.  It may be medication related but will follow the CT head, cardiac labs, hydrate then reevaluate. Sallye Ober, DO 4:44 PM    Diagnosis     Clinical Impression: Pending   Disposition:   7:15 PM : Pt care transferred to Dr. Marguarite Arbour, ED provider. History of patient complaint(s), available diagnostic reports and current treatment plan has been discussed thoroughly.   Bedside rounding on patient occured : yes .  Intended disposition of patient : TBD  Pending diagnostics reports and/or labs (please list): Patronus evaluation and recommendations.    _______________________________    Scribe Attestation:     Myna Bright, acting as a Education administrator for and in the presence of Everlena Cooper, MD      August 09, 2016 at 4:00 PM       Provider Attestation:      I personally performed the services described in the documentation, reviewed the documentation, as recorded by the scribe in my presence, and it accurately and completely records my words and actions. August 09, 2016 at 4:00 PM - Everlena Cooper, MD      _______________________________

## 2016-08-09 NOTE — ED Notes (Signed)
Delay in iv fluids R/T pt refusing IV and off floor for testing

## 2016-08-10 ENCOUNTER — Telehealth: Payer: Self-pay | Admitting: Family Medicine

## 2016-08-10 ENCOUNTER — Observation Stay: Admit: 2016-08-10 | Payer: MEDICARE | Primary: Family Medicine

## 2016-08-10 DIAGNOSIS — F17200 Nicotine dependence, unspecified, uncomplicated: Secondary | ICD-10-CM | POA: Diagnosis not present

## 2016-08-10 DIAGNOSIS — R260 Ataxic gait: Secondary | ICD-10-CM | POA: Diagnosis not present

## 2016-08-10 DIAGNOSIS — R55 Syncope and collapse: Secondary | ICD-10-CM | POA: Diagnosis not present

## 2016-08-10 DIAGNOSIS — R5383 Other fatigue: Secondary | ICD-10-CM | POA: Insufficient documentation

## 2016-08-10 DIAGNOSIS — R079 Chest pain, unspecified: Secondary | ICD-10-CM | POA: Diagnosis not present

## 2016-08-10 DIAGNOSIS — Z0389 Encounter for observation for other suspected diseases and conditions ruled out: Secondary | ICD-10-CM | POA: Diagnosis not present

## 2016-08-10 DIAGNOSIS — R531 Weakness: Secondary | ICD-10-CM | POA: Insufficient documentation

## 2016-08-10 DIAGNOSIS — G459 Transient cerebral ischemic attack, unspecified: Secondary | ICD-10-CM

## 2016-08-10 DIAGNOSIS — J329 Chronic sinusitis, unspecified: Secondary | ICD-10-CM | POA: Insufficient documentation

## 2016-08-10 DIAGNOSIS — N644 Mastodynia: Secondary | ICD-10-CM | POA: Insufficient documentation

## 2016-08-10 HISTORY — DX: Transient cerebral ischemic attack, unspecified: G45.9

## 2016-08-10 LAB — DRUG SCREEN, URINE
AMPHETAMINES: NEGATIVE
BARBITURATES: NEGATIVE
BENZODIAZEPINES: NEGATIVE
COCAINE: NEGATIVE
METHADONE: NEGATIVE
OPIATES: NEGATIVE
PCP(PHENCYCLIDINE): NEGATIVE
THC (TH-CANNABINOL): NEGATIVE

## 2016-08-10 LAB — CBC W/O DIFF
HCT: 36.2 % (ref 35.0–45.0)
HGB: 11.7 g/dL — ABNORMAL LOW (ref 12.0–16.0)
MCH: 26.3 PG (ref 24.0–34.0)
MCHC: 32.3 g/dL (ref 31.0–37.0)
MCV: 81.3 FL (ref 74.0–97.0)
MPV: 10.1 FL (ref 9.2–11.8)
PLATELET: 230 10*3/uL (ref 135–420)
RBC: 4.45 M/uL (ref 4.20–5.30)
RDW: 15.1 % — ABNORMAL HIGH (ref 11.6–14.5)
WBC: 7.6 10*3/uL (ref 4.6–13.2)

## 2016-08-10 LAB — EKG, 12 LEAD, INITIAL
Atrial Rate: 67 {beats}/min
Calculated P Axis: 46 degrees
Calculated R Axis: -24 degrees
Calculated T Axis: -4 degrees
Diagnosis: NORMAL
P-R Interval: 174 ms
Q-T Interval: 440 ms
QRS Duration: 96 ms
QTC Calculation (Bezet): 464 ms
Ventricular Rate: 67 {beats}/min

## 2016-08-10 LAB — CARDIAC PANEL,(CK, CKMB & TROPONIN)
CK - MB: 2.2 ng/ml (ref <3.6)
CK - MB: 2 ng/ml (ref <3.6)
CK - MB: 1.9 ng/ml (ref <3.6)
CK-MB Index: 1.5 % (ref 0.0–4.0)
CK-MB Index: 1.3 % (ref 0.0–4.0)
CK-MB Index: 1.3 % (ref 0.0–4.0)
CK: 144 U/L (ref 26–192)
CK: 157 U/L (ref 26–192)
CK: 149 U/L (ref 26–192)
Troponin-I, QT: <0.02 NG/ML (ref 0.0–0.045)
Troponin-I, QT: <0.02 NG/ML (ref 0.0–0.045)
Troponin-I, QT: <0.02 NG/ML (ref 0.0–0.045)

## 2016-08-10 LAB — METABOLIC PANEL, BASIC
Anion gap: 7 mmol/L (ref 3.0–18)
BUN/Creatinine ratio: 17 (ref 12–20)
BUN: 13 MG/DL (ref 7.0–18)
CO2: 24 mmol/L (ref 21–32)
Calcium: 8.1 MG/DL — ABNORMAL LOW (ref 8.5–10.1)
Chloride: 109 mmol/L — ABNORMAL HIGH (ref 100–108)
Creatinine: 0.78 MG/DL (ref 0.6–1.3)
GFR est AA: >60 mL/min/{1.73_m2} (ref >60)
GFR est non-AA: >60 mL/min/{1.73_m2} (ref >60)
Glucose: 148 mg/dL — ABNORMAL HIGH (ref 74–99)
Potassium: 3.5 mmol/L (ref 3.5–5.5)
Sodium: 140 mmol/L (ref 136–145)

## 2016-08-10 LAB — PHOSPHORUS: Phosphorus: 2.2 MG/DL — ABNORMAL LOW (ref 2.5–4.9)

## 2016-08-10 LAB — MAGNESIUM: Magnesium: 2.1 mg/dL (ref 1.6–2.6)

## 2016-08-10 LAB — EKG 12-LEAD
Atrial Rate: 67 {beats}/min
Diagnosis: NORMAL
P Axis: 46 degrees
P-R Interval: 174 ms
Q-T Interval: 440 ms
QRS Duration: 96 ms
QTc Calculation (Bazett): 464 ms
R Axis: -24 degrees
T Axis: -4 degrees
Ventricular Rate: 67 {beats}/min

## 2016-08-10 LAB — ECHOCARDIOGRAM COMPLETE 2D W DOPPLER W COLOR: Left Ventricular Ejection Fraction: 58

## 2016-08-10 MED ORDER — ALUMINUM-MAGNESIUM HYDROXIDE 200 MG-200 MG/5 ML ORAL SUSP
200-200 mg/5 mL | Freq: Four times a day (QID) | ORAL | Status: DC | PRN
Start: 2016-08-10 — End: 2016-08-11

## 2016-08-10 MED ORDER — NALOXONE 0.4 MG/ML INJECTION
0.4 mg/mL | INTRAMUSCULAR | Status: DC | PRN
Start: 2016-08-10 — End: 2016-08-11

## 2016-08-10 MED ORDER — ASPIRIN 325 MG TAB
325 mg | Freq: Every day | ORAL | Status: DC
Start: 2016-08-10 — End: 2016-08-11
  Administered 2016-08-10 – 2016-08-11 (×2): via ORAL

## 2016-08-10 MED ORDER — NICOTINE 14 MG/24 HR DAILY PATCH
14 mg/24 hr | Freq: Every day | TRANSDERMAL | Status: DC
Start: 2016-08-10 — End: 2016-08-11

## 2016-08-10 MED ORDER — PANTOPRAZOLE 40 MG TAB, DELAYED RELEASE
40 mg | Freq: Two times a day (BID) | ORAL | Status: DC
Start: 2016-08-10 — End: 2016-08-11
  Administered 2016-08-11 (×2): via ORAL

## 2016-08-10 MED ORDER — DOCUSATE SODIUM 100 MG CAP
100 mg | Freq: Two times a day (BID) | ORAL | Status: DC | PRN
Start: 2016-08-10 — End: 2016-08-11

## 2016-08-10 MED ORDER — ATORVASTATIN 40 MG TAB
40 mg | Freq: Every evening | ORAL | Status: DC
Start: 2016-08-10 — End: 2016-08-11

## 2016-08-10 MED ORDER — GADOTERATE MEGLUMINE 0.5 MMOL/ML IV SOLUTION
0.5 mmol/mL (376.9 mg/mL) | Freq: Once | INTRAVENOUS | Status: AC
Start: 2016-08-10 — End: 2016-08-10
  Administered 2016-08-10: 23:00:00 via INTRAVENOUS

## 2016-08-10 MED ORDER — AZITHROMYCIN 250 MG TAB
250 mg | Freq: Every day | ORAL | Status: DC
Start: 2016-08-10 — End: 2016-08-11
  Administered 2016-08-10 – 2016-08-11 (×2): via ORAL

## 2016-08-10 MED ORDER — ACETAMINOPHEN 325 MG TABLET
325 mg | Freq: Four times a day (QID) | ORAL | Status: DC | PRN
Start: 2016-08-10 — End: 2016-08-11
  Administered 2016-08-10 – 2016-08-11 (×2): via ORAL

## 2016-08-10 MED ORDER — PHARMACY INFORMATION NOTE
Freq: Once | Status: AC
Start: 2016-08-10 — End: 2016-08-10
  Administered 2016-08-10: 17:00:00

## 2016-08-10 MED ORDER — ENOXAPARIN 40 MG/0.4 ML SUB-Q SYRINGE
40 mg/0.4 mL | SUBCUTANEOUS | Status: DC
Start: 2016-08-10 — End: 2016-08-11
  Administered 2016-08-10 – 2016-08-11 (×2): via SUBCUTANEOUS

## 2016-08-10 MED ORDER — ONDANSETRON (PF) 4 MG/2 ML INJECTION
4 mg/2 mL | Freq: Four times a day (QID) | INTRAMUSCULAR | Status: DC | PRN
Start: 2016-08-10 — End: 2016-08-11

## 2016-08-10 MED ORDER — GADOBENATE DIMEGLUMINE 529 MG/ML (0.1 MMOL/0.2 ML) IV
529 mg/mL (0.1mmol/0.2mL) | Freq: Once | INTRAVENOUS | Status: AC
Start: 2016-08-10 — End: 2016-08-10
  Administered 2016-08-10: 06:00:00 via INTRAVENOUS

## 2016-08-10 MED ORDER — OXYCODONE-ACETAMINOPHEN 5 MG-325 MG TAB
5-325 mg | Freq: Four times a day (QID) | ORAL | Status: DC | PRN
Start: 2016-08-10 — End: 2016-08-11
  Administered 2016-08-10 – 2016-08-11 (×2): via ORAL

## 2016-08-10 MED ORDER — FAMOTIDINE (PF) 20 MG/2 ML IV
20 mg/2 mL | INTRAVENOUS | Status: AC
Start: 2016-08-10 — End: 2016-08-09
  Administered 2016-08-10: 01:00:00 via INTRAVENOUS

## 2016-08-10 MED ORDER — BENZOCAINE-MENTHOL 15 MG-3.6 MG LOZENGES
Status: DC | PRN
Start: 2016-08-10 — End: 2016-08-11
  Administered 2016-08-10 (×2)

## 2016-08-10 MED FILL — DOTAREM 0.5 MMOL/ML (376.9 MG/ML) INTRAVENOUS SOLUTION: 0.5 mmol/mL (376.9 mg/mL) | INTRAVENOUS | Qty: 19

## 2016-08-10 MED FILL — OXYCODONE-ACETAMINOPHEN 5 MG-325 MG TAB: 5-325 mg | ORAL | Qty: 1

## 2016-08-10 MED FILL — CEPACOL SORE THROAT (BENZOCAINE-MENTHOL) 15 MG-3.6 MG LOZENGES: Qty: 8

## 2016-08-10 MED FILL — FAMOTIDINE (PF) 20 MG/2 ML IV: 20 mg/2 mL | INTRAVENOUS | Qty: 2

## 2016-08-10 MED FILL — NICOTINE 14 MG/24 HR DAILY PATCH: 14 mg/24 hr | TRANSDERMAL | Qty: 1

## 2016-08-10 MED FILL — AZITHROMYCIN 250 MG TAB: 250 mg | ORAL | Qty: 2

## 2016-08-10 MED FILL — MULTIHANCE 529 MG/ML (0.1 MMOL/0.2 ML) INTRAVENOUS SOLUTION: 529 mg/mL (0.1mmol/0.2mL) | INTRAVENOUS | Qty: 20

## 2016-08-10 MED FILL — ENOXAPARIN 40 MG/0.4 ML SUB-Q SYRINGE: 40 mg/0.4 mL | SUBCUTANEOUS | Qty: 0.4

## 2016-08-10 MED FILL — TYLENOL 325 MG TABLET: 325 mg | ORAL | Qty: 2

## 2016-08-10 MED FILL — ASPIRIN 325 MG TAB: 325 mg | ORAL | Qty: 1

## 2016-08-10 MED FILL — PHARMACY INFORMATION NOTE: Qty: 1

## 2016-08-10 NOTE — Telephone Encounter (Signed)
[place] and was admitted to [facility] medical center in [place], [place]. The doctors there believe the pt had mini strokes. Pt wanted PCP aware. Pt thinks they might let her leave today. Pt would like PCP to call her and if pt can't be reached call pt's husband [phone]. ep

## 2016-08-10 NOTE — Progress Notes (Signed)
[facility] Hospitalist Group  Progress Note    Patient: Jodi [name] Age: 56 y.o. DOB: March 19, 1961 MR#: [id number] SSN: [id number]  Date: 08/10/2016     Subjective:     Reports onset of headaches, x 3-4 weeks, dizziness also with indigestion and reflux symptoms in addition to chest pressure (improves with deep breathing) over past 3-4 days.  C/o sore throat since admission.     Reports significant stressors / anxiety ongoing as well    Lower back and right shoulder injury sustained at work approximately 1 month ago.      Assessment/Plan:   1.  Headaches / dizziness ? Near syncope - no orthostasis, labs benign; Endocrinology referral with pituitary dedicated MRI.  Labs for additional workup ordered per Endocrinology recommendation.    2. Atypical chest pain / ? GI related - start PPI, maalox PRN.  Cardiac enzymes / EKG appear non-worrisome.   3.  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  Hyperlipidemia - cont statin  5.  Left sided weakness - No evidence of acute CVA; unclear significance.  Neurology referral placed  6.  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  Discussed with Dr. Ninfa Linden (ortho) office [phone] whom saw patient on 02/07 for same issue  7.  Disposition - Patient has been discussing leaving hospital but at this point agrees to staying at this time.  Patient remains undecided about how long she is willing to stay in hospital.    Have discussed with patient about risks of leaving AMA.   The patient is competent and understands the risks of leaving, including permanent disability and/or death.  She has had an opportunity to ask question about their condition.    Additional Notes:  Attempted to get records from PCP [facility] [phone] (medical record release has been faxed).      Case discussed with:  _0 Patient  _1 Family  _2 Nursing  _3 Case Management  DVT Prophylaxis:  _4 Lovenox  _5 Hep SQ  _6 SCDs  _7 Coumadin   _8 On Heparin gtt     Objective:   VS:   Visit Vitals   ??? BP 133/85 (BP 1 Location: Left arm, BP Patient Position: Supine)   ??? Pulse 74   ??? Temp 98.6 ??F (37 ??C)   ??? Resp 20   ??? Ht _9  (1.626 m)   ??? Wt 92.1 kg (203 lb)   ??? SpO2 96%   ??? BMI 34.84 kg/m2      Tmax/24hrs: Temp (24hrs), Avg:97.8 ??F (36.6 ??C), Min:97.4 ??F (36.3 ??C), Max:98.6 ??F (37 ??C)    Intake/Output Summary (Last 24 hours) at 08/10/16 1236  Last data filed at 08/10/16 0901   Gross per 24 hour   Intake              220 ml   Output                0 ml   Net              220 ml     General:  Alert, appears mildly anxious at times  Cardiovascular:  RRR  Pulmonary:  LSC throughout; respiratory effort WNL  GI:  +BS in all four quadrants, soft, non-tender  Extremities:  No edema; 2+ dorsalis pedis pulses bilaterally  Neuro: oriented x 4; 3/5 strength to LUE/LLE, 5/5 strength to RUE/RLE.     Family contact: Felix Pratt [phone]    Labs:  Recent Results (from the past 24 hour(s))   EKG, 12 LEAD, INITIAL    Collection Time: 08/09/16  4:10 PM   Result Value Ref Range    Ventricular Rate 67 BPM    Atrial Rate 67 BPM    P-R Interval 174 ms    QRS Duration 96 ms    Q-T Interval 440 ms    QTC Calculation (Bezet) 464 ms    Calculated P Axis 46 degrees    Calculated R Axis -24 degrees    Calculated T Axis -4 degrees    Diagnosis       Normal sinus rhythm  Moderate voltage criteria for LVH, may be normal variant  Nonspecific ST abnormality  Abnormal ECG  No previous ECGs available  Confirmed by Larry Sierras, MD, [place] [phone]) on 08/10/2016 12:19:50 PM     CBC WITH AUTOMATED DIFF    Collection Time: 08/09/16  5:45 PM   Result Value Ref Range    WBC 7.1 4.6 - 13.2 K/uL    RBC 4.85 4.20 - 5.30 M/uL    HGB 12.9 12.0 - 16.0 g/dL    HCT 39.5 35.0 - 45.0 %    MCV 81.4 74.0 - 97.0 FL    MCH 26.6 24.0 - 34.0 PG    MCHC 32.7 31.0 - 37.0 g/dL    RDW 15.1 (H) 11.6 - 14.5 %    PLATELET 235 135 - 420 K/uL    MPV 10.4 9.2 - 11.8 FL    NEUTROPHILS 60 40 - 73 %    LYMPHOCYTES 30 21 - 52 %     MONOCYTES 9 3 - 10 %    EOSINOPHILS 1 0 - 5 %    BASOPHILS 0 0 - 2 %    ABS. NEUTROPHILS 4.3 1.8 - 8.0 K/UL    ABS. LYMPHOCYTES 2.1 0.9 - 3.6 K/UL    ABS. MONOCYTES 0.6 0.05 - 1.2 K/UL    ABS. EOSINOPHILS 0.1 0.0 - 0.4 K/UL    ABS. BASOPHILS 0.0 0.0 - 0.06 K/UL    DF AUTOMATED     METABOLIC PANEL, COMPREHENSIVE    Collection Time: 08/09/16  5:45 PM   Result Value Ref Range    Sodium 139 136 - 145 mmol/L    Potassium 3.8 3.5 - 5.5 mmol/L    Chloride 106 100 - 108 mmol/L    CO2 27 21 - 32 mmol/L    Anion gap 6 3.0 - 18 mmol/L    Glucose 143 (H) 74 - 99 mg/dL    BUN 14 7.0 - 18 MG/DL    Creatinine 0.91 0.6 - 1.3 MG/DL    BUN/Creatinine ratio 15 12 - 20      GFR est AA >60 >60 ml/min/1.81m    GFR est non-AA >60 >60 ml/min/1.712m   Calcium 8.6 8.5 - 10.1 MG/DL    Bilirubin, total 0.3 0.2 - 1.0 MG/DL    ALT (SGPT) 32 13 - 56 U/L    AST (SGOT) 23 15 - 37 U/L    Alk. phosphatase 122 (H) 45 - 117 U/L    Protein, total 7.4 6.4 - 8.2 g/dL    Albumin 3.4 3.4 - 5.0 g/dL    Globulin 4.0 2.0 - 4.0 g/dL    A-G Ratio 0.9 0.8 - 1.7     LIPASE    Collection Time: 08/09/16  5:45 PM   Result Value Ref Range    Lipase 111 73 - 393 U/L   MAGNESIUM  Collection Time: 08/09/16  5:45 PM   Result Value Ref Range    Magnesium 2.4 1.6 - 2.6 mg/dL   TROPONIN I    Collection Time: 08/09/16  5:45 PM   Result Value Ref Range    Troponin-I, Qt. <0.02 0.0 - 0.045 NG/ML   PROTHROMBIN TIME + INR    Collection Time: 08/09/16  5:45 PM   Result Value Ref Range    Prothrombin time 12.4 11.5 - 15.2 sec    INR 1.0 0.8 - 1.2     ETHYL ALCOHOL    Collection Time: 08/09/16  5:45 PM   Result Value Ref Range    ALCOHOL(ETHYL),SERUM <3 0 - 3 MG/DL   METABOLIC PANEL, BASIC    Collection Time: 08/10/16  2:11 AM   Result Value Ref Range    Sodium 140 136 - 145 mmol/L    Potassium 3.5 3.5 - 5.5 mmol/L    Chloride 109 (H) 100 - 108 mmol/L    CO2 24 21 - 32 mmol/L    Anion gap 7 3.0 - 18 mmol/L    Glucose 148 (H) 74 - 99 mg/dL    BUN 13 7.0 - 18 MG/DL     Creatinine 0.78 0.6 - 1.3 MG/DL    BUN/Creatinine ratio 17 12 - 20      GFR est AA >60 >60 ml/min/1.68m    GFR est non-AA >60 >60 ml/min/1.7104m   Calcium 8.1 (L) 8.5 - 10.1 MG/DL   MAGNESIUM    Collection Time: 08/10/16  2:11 AM   Result Value Ref Range    Magnesium 2.1 1.6 - 2.6 mg/dL   PHOSPHORUS    Collection Time: 08/10/16  2:11 AM   Result Value Ref Range    Phosphorus 2.2 (L) 2.5 - 4.9 MG/DL   CBC W/O DIFF    Collection Time: 08/10/16  2:11 AM   Result Value Ref Range    WBC 7.6 4.6 - 13.2 K/uL    RBC 4.45 4.20 - 5.30 M/uL    HGB 11.7 (L) 12.0 - 16.0 g/dL    HCT 36.2 35.0 - 45.0 %    MCV 81.3 74.0 - 97.0 FL    MCH 26.3 24.0 - 34.0 PG    MCHC 32.3 31.0 - 37.0 g/dL    RDW 15.1 (H) 11.6 - 14.5 %    PLATELET 230 135 - 420 K/uL    MPV 10.1 9.2 - 11.8 FL   CARDIAC PANEL,(CK, CKMB & TROPONIN)    Collection Time: 08/10/16  2:11 AM   Result Value Ref Range    CK 144 26 - 192 U/L    CK - MB 2.2 <3.6 ng/ml    CK-MB Index 1.5 0.0 - 4.0 %    Troponin-I, Qt. <0.02 0.0 - 0.045 NG/ML   CARDIAC PANEL,(CK, CKMB & TROPONIN)    Collection Time: 08/10/16  9:40 AM   Result Value Ref Range    CK 157 26 - 192 U/L    CK - MB 2.0 <3.6 ng/ml    CK-MB Index 1.3 0.0 - 4.0 %    Troponin-I, Qt. <0.02 0.0 - 0.045 NG/ML     Signed By: KaJanine OresNP     August 10, 2016

## 2016-08-10 NOTE — Other (Incomplete)
Bedside and Verbal shift change report given to   (oncoming nurse) by Armando Gang, RN (offgoing nurse). Report included the following information SBAR, Kardex, MAR and Recent Results.    SITUATION:   ? Code Status: Full Code  ? Reason for Admission: Near syncope    ? Hospital day: 0  ? Problem List:       Hospital Problems  Never Reviewed          Codes Class Noted POA    Near syncope ICD-10-CM: R55  ICD-9-CM: 780.2  08/09/2016 Unknown              BACKGROUND:    Past Medical History: History reviewed. No pertinent past medical history.      Patient taking anticoagulants yes     ASSESSMENT:   ? Changes in Assessment Throughout Shift: none    ? Patient has Central Line: no Reasons if yes: n/a  ? Patient has Foley Cath: no Reasons if yes: n/a     ? Last Vitals:     Vitals:    08/09/16 2333 08/10/16 0000 08/10/16 0118 08/10/16 0400   BP: 113/72 113/73  100/63   Pulse: 70 71  79   Resp: 19 18  18    Temp: 97.4 ??F (36.3 ??C) 97.4 ??F (36.3 ??C)  97.5 ??F (36.4 ??C)   SpO2: 99% 98%  95%   Weight:   92.1 kg (203 lb)        ? IV and DRAINS (will only show if present)   Peripheral IV 08/09/16 Right Hand-Site Assessment: Clean, dry, & intact    ? WOUND (if present)   Wound Type:  none   Dressing present Dressing Present : No   Wound Concerns/Notes:  none    ? PAIN    Pain Assessment    Pain Intensity 1: 0 (08/10/16 0155)    Pain Location 1: Leg         Patient Stated Pain Goal: 1  o Interventions for Pain:  No complaint of pain  o Intervention effective: yes  o Time of last intervention: 0600   o Reassessment Completed: yes     ? Last 3 Weights:  Last 3 Recorded Weights in this Encounter    08/10/16 0118   Weight: 92.1 kg (203 lb)     Weight change:     ? INTAKE/OUPUT    Current Shift:      Last three shifts:      ? LAB RESULTS     Recent Labs      08/10/16   0211  08/09/16   1745   WBC  7.6  7.1   HGB  11.7*  12.9   HCT  36.2  39.5   PLT  230  235        Recent Labs      08/10/16   0211  08/09/16   1745   NA  140  139    K  3.5  3.8   GLU  148*  143*   BUN  13  14   CREA  0.78  0.91   CA  8.1*  8.6   MG  2.1  2.4   INR   --   1.0       RECOMMENDATIONS AND DISCHARGE PLANNING     1. Pending tests/procedures/ Plan of Care or Other Needs: MRI, Hydration     2. Discharge plan for patient and Needs/Barriers: TBD    3.  Estimated Discharge Date: TBD Posted on Whiteboard in Patient???s Room: yes      4. The patient's care plan was reviewed with the oncoming nurse.       "HEALS" SAFETY CHECK      Fall Risk    Total Score: 0    Safety Measures: Safety Measures: Bed/Chair-Wheels locked, Bed in low position, Call light within reach    A safety check occurred in the patient's room between off going nurse and oncoming nurse listed above.    The safety check included the below items  Area Items   H  High Alert Medications ? Verify all high alert medication drips (heparin, PCA, etc.)   E  Equipment ? Suction is set up for ALL patients (with yanker)  ? Red plugs utilized for all equipment (IV pumps, etc.)  ? WOW???s wiped down at end of shift.  ? Room stocked with oxygen, suction, and other unit-specific supplies   A  Alarms ? Bed alarm is set for fall risk patients  ? Ensure chair alarm is in place and activated if patient is up in a chair   L  Lines ? Check IV for any infiltration  ? Foley bag is empty if patient has a Foley   ? Tubing and IV bags are labeled   S  Safety   ? Room is clean, patient is clean, and equipment is clean.  ? Hallways are clear from equipment besides carts.   ? Fall bracelet on for fall risk patients  ? Ensure room is clear and free of clutter  ? Suction is set up for ALL patients (with yanker)  ? Hallways are clear from equipment besides carts.   ? Isolation precautions followed, supplies available outside room, sign posted     Armando GangLulu S Javaluyas, RN

## 2016-08-10 NOTE — Progress Notes (Signed)
ENDOCRINOLOGY: Pt seen briefly before taken to radiology for MRI, Brain    56 yr old female who recently has had a few family tragedies due to family deaths had a syncopal episode today and was [facility].  Pt was fully recovered at the time of evaluation. 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 Pituitary hormones levels are pending. Pt is stable for discharge for tomorrow.

## 2016-08-10 NOTE — Other (Addendum)
1930 Received report from Lorenza CambridgeSherita Gunn, RN(offgoing nurse) given to Caryl NeverKim Williams, RN(oncoming nurse). "Report included SBAR,MAR,KARDEX,I/O    0800 Bedside verbal shift change report given to [facility], RN(oncoming nurse) by Caryl NeverKim Williams, RN(offgoing nurse). Report included SBAR,MAR,KARDEX,I/O

## 2016-08-10 NOTE — Progress Notes (Signed)
Lying 133/85, hr 74, sitting 133/84, hr 75, standing 137/83, hr 88

## 2016-08-10 NOTE — Progress Notes (Signed)
Pt is 56 y.o admitted for Near syncope.  Pt alert and oriented and spouse Liliane Channelntonio Reed [phone] at bedside.  Pt reports [place] in [place] and came to [place] for a funeral and fell sick. The reports her pcp is Dr. Harriett Sinealeigh Rumlley [phone] in [place].  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  Pt reports she has wheelchair and cane at home.  Pt hopes to be discharged home.  Pt signed the MOON and OBS forms and copies given to pt while originals are filed in pt's paper chart.

## 2016-08-10 NOTE — Progress Notes (Signed)
MRI safety screening form needs to be filled out and faxed to [phone] BEFORE MRI can be scheduled.  If unable to acquire information from patient [employer] needs to be contacted.  If patient is claustrophobic or needs pain meds please have them ordered in advance to help facilitate patient exam.

## 2016-08-10 NOTE — Progress Notes (Signed)
Ms. Jodi [name] was not in her room when I stopped by, but I briefly spoke with her mother-in-law and left a greeting card. She did not need any support from me today, but I invited her to call us at any time.     Chaplain Drue StagerLauren Voyles, M.Div, CPE Resident   Pager: [phone]  Phone: [phone]

## 2016-08-10 NOTE — Other (Signed)
Bedside and Verbal shift change report given to Engineer, maintenance (IT)  (oncoming nurse) by Armando Gang, RN (offgoing nurse). Report included the following information SBAR, Kardex, MAR and Recent Results.    SITUATION:   ? Code Status: Full Code  ? Reason for Admission: Near syncope    ? Hospital day: 0  ? Problem List:       Hospital Problems  Never Reviewed          Codes Class Noted POA    TIA (transient ischemic attack) ICD-10-CM: G45.9  ICD-9-CM: 435.9  08/10/2016 Unknown        Other chest pain ICD-10-CM: R07.89  ICD-9-CM: 786.59  08/10/2016 Unknown        Tobacco abuse ICD-10-CM: Z72.0  ICD-9-CM: 305.1  08/10/2016 Unknown        Weakness generalized ICD-10-CM: R53.1  ICD-9-CM: 780.79  08/10/2016 Unknown        * (Principal)Near syncope ICD-10-CM: R55  ICD-9-CM: 780.2  08/09/2016 Unknown              BACKGROUND:    Past Medical History: History reviewed. No pertinent past medical history.      Patient taking anticoagulants yes     ASSESSMENT:   ? Changes in Assessment Throughout Shift: none    ? Patient has Central Line: no Reasons if yes: n/a  ? Patient has Foley Cath: no Reasons if yes: n/a     ? Last Vitals:     Vitals:    08/09/16 2333 08/10/16 0000 08/10/16 0118 08/10/16 0400   BP: 113/72 113/73  100/63   Pulse: 70 71  79   Resp: 19 18  18    Temp: 97.4 ??F (36.3 ??C) 97.4 ??F (36.3 ??C)  97.5 ??F (36.4 ??C)   SpO2: 99% 98%  95%   Weight:   92.1 kg (203 lb)        ? IV and DRAINS (will only show if present)   Peripheral IV 08/09/16 Right Hand-Site Assessment: Clean, dry, & intact    ? WOUND (if present)   Wound Type:  none   Dressing present Dressing Present : No   Wound Concerns/Notes:  none    ? PAIN    Pain Assessment    Pain Intensity 1: 0 (08/10/16 0155)    Pain Location 1: Leg         Patient Stated Pain Goal: 1  o Interventions for Pain:  No complaint of pain  o Intervention effective: yes  o Time of last intervention: 0600   o Reassessment Completed: yes     ? Last 3 Weights:  Last 3 Recorded Weights in this Encounter     08/10/16 0118   Weight: 92.1 kg (203 lb)     Weight change:     ? INTAKE/OUPUT    Current Shift:      Last three shifts:      ? LAB RESULTS     Recent Labs      08/10/16   0211  08/09/16   1745   WBC  7.6  7.1   HGB  11.7*  12.9   HCT  36.2  39.5   PLT  230  235        Recent Labs      08/10/16   0211  08/09/16   1745   NA  140  139   K  3.5  3.8   GLU  148*  143*   BUN  13  14   CREA  0.78  0.91   CA  8.1*  8.6   MG  2.1  2.4   INR   --   1.0       RECOMMENDATIONS AND DISCHARGE PLANNING     1. Pending tests/procedures/ Plan of Care or Other Needs: MRI, Hydration     2. Discharge plan for patient and Needs/Barriers: TBD    3. Estimated Discharge Date: TBD Posted on Whiteboard in Patient???s Room: yes      4. The patient's care plan was reviewed with the oncoming nurse.       "HEALS" SAFETY CHECK      Fall Risk    Total Score: 0    Safety Measures: Safety Measures: Bed/Chair-Wheels locked, Bed in low position, Call light within reach    A safety check occurred in the patient's room between off going nurse and oncoming nurse listed above.    The safety check included the below items  Area Items   H  High Alert Medications ? Verify all high alert medication drips (heparin, PCA, etc.)   E  Equipment ? Suction is set up for ALL patients (with yanker)  ? Red plugs utilized for all equipment (IV pumps, etc.)  ? WOW???s wiped down at end of shift.  ? Room stocked with oxygen, suction, and other unit-specific supplies   A  Alarms ? Bed alarm is set for fall risk patients  ? Ensure chair alarm is in place and activated if patient is up in a chair   L  Lines ? Check IV for any infiltration  ? Foley bag is empty if patient has a Foley   ? Tubing and IV bags are labeled   S  Safety   ? Room is clean, patient is clean, and equipment is clean.  ? Hallways are clear from equipment besides carts.   ? Fall bracelet on for fall risk patients  ? Ensure room is clear and free of clutter  ? Suction is set up for ALL patients (with yanker)   ? Hallways are clear from equipment besides carts.   ? Isolation precautions followed, supplies available outside room, sign posted     Armando GangLulu S Javaluyas, RN

## 2016-08-10 NOTE — Other (Signed)
Interdisciplinary Round Note   Patient Information:   Jodi [name]   [id number]   Reason for Admission: Near syncope   Attending Provider:   Roe CoombsGamal S Gobraeel, MD  Primary Care Physician:       Phys Other, MD       None   Estimated discharge date:  TBD   Hospital day: 0  @GLOS @  - - -  RRAT Score: Low Risk            12       Total Score        3 Has Seen PCP in Last 6 Months (Yes=3, No=0)    9 Pt. Coverage (Medicare=5 , Medicaid, or Self-Pay=4)        Criteria that do not apply:    Married. Living with Significant Other. Assisted Living. LTAC. SNF. or   Rehab    Patient Length of Stay (>5 days = 3)    IP Visits Last 12 Months (1-3=4, 4=9, >4=11)    Charlson Comorbidity Score (Age + Comorbid Conditions)       normal sinus rhythm              Chemical      Lines, Drains, & Airways  None       IV Antibiotics:    Current Antimicrobial Therapy     None        GI Prophylaxis: GI Prophylaxis:    Type:       Recent Glucose Results:   Lab Results   Component Value Date/Time    GLU 148 (H) 08/10/2016 02:11 AM    GLU 143 (H) 08/09/2016 05:45 PM      Activity Level:  Activity Level: Bath Room Privileges    Needs assistance with ADLs: no       Goals for Today:  IV fluid, MRI, CT, 2d ECho.   Recommendations:   Discharge Disposition: Home Independent  CM  Pharmacy evaluation and teaching    Needs for Discharge: TBD IDR Team: RN, CM MD  Recommendations from IDR team:     Other Notes:

## 2016-08-10 NOTE — Progress Notes (Signed)
Echocardiogram completed. Report to follow.

## 2016-08-10 NOTE — Telephone Encounter (Signed)
Alyssa Montgomery who is a Designer, jewellery is taking care of our patient in [facility] and the patient wants to leave. They would like to speak to her PCP before they let her leave. Please call her at [phone]. jw

## 2016-08-10 NOTE — Telephone Encounter (Signed)
Attempted to call both PA and patient's husband x2 without response.

## 2016-08-11 DIAGNOSIS — R531 Weakness: Secondary | ICD-10-CM | POA: Diagnosis not present

## 2016-08-11 DIAGNOSIS — R55 Syncope and collapse: Secondary | ICD-10-CM | POA: Diagnosis not present

## 2016-08-11 DIAGNOSIS — F17200 Nicotine dependence, unspecified, uncomplicated: Secondary | ICD-10-CM | POA: Diagnosis not present

## 2016-08-11 DIAGNOSIS — R079 Chest pain, unspecified: Secondary | ICD-10-CM | POA: Diagnosis not present

## 2016-08-11 LAB — METABOLIC PANEL, BASIC
Anion gap: 4 mmol/L (ref 3.0–18)
BUN/Creatinine ratio: 20 (ref 12–20)
BUN: 18 MG/DL (ref 7.0–18)
CO2: 29 mmol/L (ref 21–32)
Calcium: 8.2 MG/DL — ABNORMAL LOW (ref 8.5–10.1)
Chloride: 108 mmol/L (ref 100–108)
Creatinine: 0.89 MG/DL (ref 0.6–1.3)
GFR est AA: >60 mL/min/{1.73_m2} (ref >60)
GFR est non-AA: >60 mL/min/{1.73_m2} (ref >60)
Glucose: 124 mg/dL — ABNORMAL HIGH (ref 74–99)
Potassium: 3.5 mmol/L (ref 3.5–5.5)
Sodium: 141 mmol/L (ref 136–145)

## 2016-08-11 LAB — TSH 3RD GENERATION: TSH: 0.98 u[IU]/mL (ref 0.36–3.74)

## 2016-08-11 LAB — CULTURE, URINE
Culture result:: <10000
Culture: <10000

## 2016-08-11 LAB — CBC WITH AUTOMATED DIFF
ABS. BASOPHILS: 0 10*3/uL (ref 0.0–0.1)
ABS. EOSINOPHILS: 0.1 10*3/uL (ref 0.0–0.4)
ABS. LYMPHOCYTES: 2.4 10*3/uL (ref 0.9–3.6)
ABS. MONOCYTES: 0.6 10*3/uL (ref 0.05–1.2)
ABS. NEUTROPHILS: 3 10*3/uL (ref 1.8–8.0)
BASOPHILS: 0 % (ref 0–2)
EOSINOPHILS: 1 % (ref 0–5)
HCT: 36.8 % (ref 35.0–45.0)
HGB: 11.6 g/dL — ABNORMAL LOW (ref 12.0–16.0)
LYMPHOCYTES: 39 % (ref 21–52)
MCH: 26.1 PG (ref 24.0–34.0)
MCHC: 31.5 g/dL (ref 31.0–37.0)
MCV: 82.7 FL (ref 74.0–97.0)
MONOCYTES: 10 % (ref 3–10)
MPV: 10.4 FL (ref 9.2–11.8)
NEUTROPHILS: 50 % (ref 40–73)
PLATELET: 230 10*3/uL (ref 135–420)
RBC: 4.45 M/uL (ref 4.20–5.30)
RDW: 15.1 % — ABNORMAL HIGH (ref 11.6–14.5)
WBC: 6.2 10*3/uL (ref 4.6–13.2)

## 2016-08-11 LAB — ESTRADIOL: Estradiol: <5 pg/mL

## 2016-08-11 LAB — CORTISOL: Cortisol, random: 3.2 ug/dL (ref 3.09–22.40)

## 2016-08-11 LAB — T4, FREE: T4, Free: 1 NG/DL (ref 0.7–1.5)

## 2016-08-11 MED ORDER — NICOTINE 14 MG/24 HR DAILY PATCH
14 mg/24 hr | MEDICATED_PATCH | Freq: Every day | TRANSDERMAL | 0 refills | Status: AC
Start: 2016-08-11 — End: 2016-09-10

## 2016-08-11 MED ORDER — ACETAMINOPHEN 325 MG TABLET
325 mg | ORAL_TABLET | Freq: Four times a day (QID) | ORAL | 0 refills | Status: AC | PRN
Start: 2016-08-11 — End: ?

## 2016-08-11 MED ORDER — ALUMINUM-MAGNESIUM HYDROXIDE 200 MG-200 MG/5 ML ORAL SUSP
200-200 mg/5 mL | Freq: Four times a day (QID) | ORAL | 0 refills | Status: AC | PRN
Start: 2016-08-11 — End: ?

## 2016-08-11 MED ORDER — ATORVASTATIN 40 MG TAB
40 mg | ORAL_TABLET | Freq: Every evening | ORAL | 0 refills | Status: DC
Start: 2016-08-11 — End: 2016-08-11

## 2016-08-11 MED ORDER — PANTOPRAZOLE 40 MG TAB, DELAYED RELEASE
40 mg | ORAL_TABLET | Freq: Two times a day (BID) | ORAL | 0 refills | Status: AC
Start: 2016-08-11 — End: ?

## 2016-08-11 MED ORDER — BENZOCAINE-MENTHOL 15 MG-3.6 MG LOZENGES
LOZENGE | 0 refills | Status: AC | PRN
Start: 2016-08-11 — End: ?

## 2016-08-11 MED ORDER — NITROGLYCERIN 0.4 MG SUBLINGUAL TAB
0.4 mg | SUBLINGUAL | 0 refills | Status: AC | PRN
Start: 2016-08-11 — End: ?

## 2016-08-11 MED ORDER — AZITHROMYCIN 250 MG TAB
250 mg | ORAL_TABLET | Freq: Every day | ORAL | 0 refills | Status: AC
Start: 2016-08-11 — End: 2016-08-15

## 2016-08-11 MED ORDER — ASPIRIN 325 MG TAB
325 mg | ORAL_TABLET | Freq: Every day | ORAL | 0 refills | Status: AC
Start: 2016-08-11 — End: ?

## 2016-08-11 MED FILL — ENOXAPARIN 40 MG/0.4 ML SUB-Q SYRINGE: 40 mg/0.4 mL | SUBCUTANEOUS | Qty: 0.4

## 2016-08-11 MED FILL — PANTOPRAZOLE 40 MG TAB, DELAYED RELEASE: 40 mg | ORAL | Qty: 1

## 2016-08-11 MED FILL — OXYCODONE-ACETAMINOPHEN 5 MG-325 MG TAB: 5-325 mg | ORAL | Qty: 1

## 2016-08-11 MED FILL — NICOTINE 14 MG/24 HR DAILY PATCH: 14 mg/24 hr | TRANSDERMAL | Qty: 1

## 2016-08-11 MED FILL — ASPIRIN 325 MG TAB: 325 mg | ORAL | Qty: 1

## 2016-08-11 MED FILL — AZITHROMYCIN 250 MG TAB: 250 mg | ORAL | Qty: 2

## 2016-08-11 MED FILL — TYLENOL 325 MG TABLET: 325 mg | ORAL | Qty: 2

## 2016-08-11 NOTE — Consults (Signed)
[facility]    Name:[name], [name]  MR#: [id number]  DOB: 03-Oct-1960  ACCOUNT #: [id number]   DATE OF SERVICE: 08/11/2016    REASON FOR CONSULTATION:  Stroke, .    HISTORY OF PRESENT ILLNESS:  This patient presented yesterday and was seen by teleneurology with an unclear history of stroke by teleneurology report and I confirmed this vague that is not convincing for stroke.  She has had a prodromal symptomatology over the last month to 2 months of substernal burning discomfort, at times pressure-like, nausea and a feeling of malaise.  Perhaps triggered with activity.  Confusing to the patient as she has also been dealing with heavy stress related to dealing with an elderly mother, as well as other family members that are having social stressors and she is "the rock" of the family.  She has been prescribed a muscle relaxer, tizanidine sometime ago, but had not taken it until the day of presentation.  She reported feeling a malaise, blurring of vision, cognitive difficulty, back achiness and cognitive difficulty associated with this.  She has ongoing tobacco use and I counseled her in no uncertain terms for her long-term health, she needs to stop.  There is a positive family history of vascular disease.    PHYSICAL EXAMINATION:  VITAL SIGNS:  Shows blood pressure 113/70, nontachypneic, pulse 73, it is regular.   GENERAL:  Pleasant, alert.    HEENT:  A prosthetic right eye.  Visual fields in left eye is intact.  Face is symmetric.  No other cranial nerve neuropathy.  No droop.  Finger tapping intact.  Cognition is normal.  Reflexes are symmetric, nonpathologic, no sensory loss to cortical modalities.      MRI brain shows no evidence of stroke.  A 2D echocardiogram showed normal ejection fraction.  An incidental finding of a pituitary territory enlargement was made and an evaluation of this has been undertaken.    ASSESSMENT:  The patient's clinical presentation is not consistent with stroke or  TIA.  Brain imaging shows no evidence of stroke.  An incidental finding of a pituitary enlargement.  Discussed with the hospitalist, the nurse practitioner to consider the possibility of cardiac angina that has been ongoing for the last few months in a 56 year old black smoking lady.  Otherwise, I do not believe the patient is having brain ischemia.  I believe her symptomatology is likely related to the fact that she had taken Zanaflex.  No further recommendations and will sign off the case.      WILLIAM L. Dia CrawfordINGLER, II, MD       WLT / SN  D: 08/11/2016 13:06     T: 08/11/2016 15:29  JOB #: [id number]

## 2016-08-11 NOTE — Discharge Summary (Signed)
Discharge Summary by Claudius Sis, NP at 08/11/16 1402                Author: Claudius Sis, NP  Service: Hospitalist  Author Type: Nurse Practitioner       Filed: 08/12/16 2314  Date of Service: 08/11/16 1402  Status: Attested           Editor: Claudius Sis, NP (Nurse Practitioner)  Cosigner: Woodward Ku, MD at 08/14/16 1621          Attestation signed by Woodward Ku, MD at 08/14/16 1621          I have personally seen, evaluated and examined the patient. Agree with documentation of assessment and plan as documented by Dwyane Dee NP.    Pt feels great, no CP now.   No ACS but needs out pt stress test, d/w pt, she wants to follow up with PCP and do stress test in [place]   Neuro in put noted    Endocrine ok for d/c home    Needs out pt follow up with endocrine and PCP   D/w pt and husband in detail                                        [facility]   Discharge Summary             Patient: Jodi [name]  Age: 56 y.o.  DOB: 01/28/1961 MR#:  [id number] SSN:  [id number]   PCP on record: Phys Other, MD   Admit date: 08/09/2016   Discharge date: 08/11/2016        Disposition:      [] Home   [] Home with Home Health   [] SNF/NH   [] Rehab   [x] Home with family    []  Alternate Facility:____________________      Discharge Diagnoses:                              1.  Atypical chest pain   2.  Headaches in setting of mild sinusitis   3.  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  Potential side effects from Zanaflex   5.  GERD   6.  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  LUE/LLE weakness        Discharge Medications:          Discharge Medication List as of 08/11/2016  6:17 PM              START taking these medications          Details        acetaminophen (TYLENOL) 325 mg tablet  Take 2 Tabs by mouth every six (6) hours as needed., No Print, Disp-30 Tab, R-0               aluminum-magnesium hydroxide  (MAALOX) 200-200 mg/5 mL suspension  Take 15 mL by mouth four (4) times daily as needed for Indigestion. Indications: gastroesophageal reflux disease, No Print, Disp-30 mL, R-0               azithromycin (ZITHROMAX) 250 mg tablet  Take 2 Tabs by mouth daily for 4 days., Print, Disp-6 Tab, R-0  benzocaine-menthol (CEPACOL) 15-3.6 mg lozg lozenge  1 Lozenge by Mucous Membrane route as needed., Print, Disp-30 Lozenge, R-0               pantoprazole (PROTONIX) 40 mg tablet  Take 1 Tab by mouth Before breakfast and dinner. Indications: gastroesophageal reflux disease, Print, Disp-60 Tab, R-0               aspirin (ASPIRIN) 325 mg tablet  Take 1 Tab by mouth daily., Print, Disp-30 Tab, R-0               nicotine (NICODERM CQ) 14 mg/24 hr patch  1 Patch by TransDERmal route daily for 30 days., Print, Disp-30 Patch, R-0               nitroglycerin (NITROSTAT) 0.4 mg SL tablet  1 Tab by SubLINGual route every five (5) minutes as needed for Chest Pain., Print, Disp-1 Bottle, R-0                     STOP taking these medications                  atorvastatin (LIPITOR) 40 mg tablet  Comments:    Reason for Stopping:                      diclofenac EC (VOLTAREN) 75 mg EC tablet  Comments:    Reason for Stopping:                      tiZANidine (ZANAFLEX) 4 mg tablet  Comments:    Reason for Stopping:                          Consults:     - Endocrinology    - Neurology      Procedures:   -   None      Significant Diagnostic Studies:    -  MRI PITUIT W WO CONT on 08/10/2016   IMPRESSION:   ??   Pituitary gland is borderline size for patient age measuring 8 mm craniocaudal   with convex superior margin. No focal well-defined pituitary mass identified.   -Less enlarged and less avidly enhancing than expected for process such as   pituitary hyperplasia.   -The infundibulum is not significantly thickened to suggest a lymphocytic   hypophysitis.   -Could be normal for this patient.   ??   Trace amount of abnormal dural  thickening and enhancement along the ventral   margin of the sella and posterior aspect of the planum sphenoidale.   -Uncertain significance. Does not appear to be dural reaction related to process   in the pituitary gland but possible.   -An incidental tiny (2 mm thickness) meningioma possible.   ??   Correlation with pituitary laboratory values is needed.   Consider follow-up pituitary MRI to evaluate for stability.      MRI BRAIN W WO CONT on 08/10/2016   IMPRESSIONS:  1. No evidence of acute infarction or acute ischemic process in brain.    2. The pituitary gland is mildly enlarged. Findings suspicious for pituitary  adenoma measuring  1.1 cm in diameter.  Follow-up evaluation with follow-up dedicated MRI of pituitary gland, without  and with contrast, is recommended.    3. No additional intra-axial or extra-axial mass lesion or mass effect.      CT HEAD WO CONT  on 08/09/2016   IMPRESSION:     ??   Small symmetric bilateral basal ganglia infarcts are age indeterminate. If   determination of acuity is desired, recommend MR brain.   Mild sinonasal mucosal disease.      XR CHEST SNGL V on 08/09/2016   IMPRESSION:   ??   No acute pulmonary disease.        Hospital Course by Problem     1.  Atypical chest pain -  Presented with complaint of worsening burning pain to chest for past 3-4 weeks, unclear if exacerbated by activity.  Cardiac enzymes  and EKG without acute concern.  Discussed and offered option for stress test while inpatient, however patient defers and wishes to complete as outpatient.  Encourage outpatient completion of stress test r/o cardiac angina.  Provided with nitroglycerin  as needed.    2.  Headaches in setting of mild sinusitis - Reports generalized headaches and with evidence of sinusitis on CT scan.  Course of azithromax started while inpatient with plan to complete on discharge.  Consider impact of borderline sized pituitary and  potential small meningioma.     3.  Borderline sized pituitary  gland / potential mengioma - Patient with concern for pituitary adenoma on MRI of brain for which endocrinology consulted and dedicated pituitary MRI was completed as above.  TSH, Free T4, insulin like growth factor, estradiol  and random cortisol levels were normal.  Recommend outpatient endocrinology follow up.    4.  Potential side effects from Zanaflex - Patient reporting prior to admission malaise, cognitive issues and blurred vision.  Patient no complaining of this during admission, consider reaction to zanaflex as started taking just prior to admission.    5.  GERD - presents with burning type pain for which patient taking multiple tums with relief.  Patient was started on protonix during admission with relief of symptoms.    6.  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 Discussed stopping zanaflex, follow with PT and ortho Dr. Magnus Ivan as outpatient   7.  LUE/LLE weakness - no evidence of CVA per imaging and neurology.  ? Effect of work injury, follow as outpatient        Today's examination of the patient revealed:          Subjective:     Continues to have headaches at times, no other current complaints.  States reflux type symptoms much improved with use of PPI.        Denies chest pain / pressure, dizziness, shortness of breath.  She states she has never been on statin or been told high cholesterol.       Objective:     VS:      Visit Vitals         ?  BP  113/70 (BP 1 Location: Left arm, BP Patient Position: At rest)         ?  Pulse  73     ?  Temp  97 ??F (36.1 ??C)     ?  Resp  18     ?  Ht  5\' 4"  (1.626 m)     ?  Wt  92.1 kg (203 lb)     ?  SpO2  96%         ?  BMI  34.84 kg/m2         Tmax/24hrs: Temp (24hrs), Avg:97.4 ??F (36.3 ??C), Min:97 ??F  (36.1 ??C),  Max:98 ??F (36.7 ??C)       Input/Output:    Intake/Output Summary (Last 24 hours) at 08/11/16 1402  Last data filed at 08/11/16 0000        Gross per 24 hour     Intake               720 ml     Output                 0 ml     Net                720 ml        General:  Alert, NAD   Cardiovascular:  RRR   Pulmonary:  LSC throughout   GI:  +BS in all four quadrants, soft, non-tender   Extremities:  No edema; 2+ dorsalis pedis pulses bilaterally   Neuro: oriented x 4; 3/5 LUE/LLE weakness      Labs:       Recent Results (from the past 24 hour(s))     CARDIAC PANEL,(CK, CKMB & TROPONIN)          Collection Time: 08/10/16  3:01 PM         Result  Value  Ref Range            CK  149  26 - 192 U/L       CK - MB  1.9  <3.6 ng/ml       CK-MB Index  1.3  0.0 - 4.0 %       Troponin-I, Qt.  <0.02  0.0 - 0.045 NG/ML       CORTISOL          Collection Time: 08/10/16  3:01 PM         Result  Value  Ref Range            Cortisol, random  3.2  3.09 - 22.40 ug/dL       ESTRADIOL          Collection Time: 08/10/16  3:01 PM         Result  Value  Ref Range            Estradiol  <5.0  pg/mL       TSH 3RD GENERATION          Collection Time: 08/10/16  3:01 PM         Result  Value  Ref Range            TSH  0.98  0.36 - 3.74 uIU/mL       T4, FREE          Collection Time: 08/11/16  2:10 AM         Result  Value  Ref Range            T4, Free  1.0  0.7 - 1.5 NG/DL       METABOLIC PANEL, BASIC          Collection Time: 08/11/16  2:10 AM         Result  Value  Ref Range            Sodium  141  136 - 145 mmol/L       Potassium  3.5  3.5 - 5.5 mmol/L       Chloride  108  100 - 108 mmol/L       CO2  29  21 - 32 mmol/L  Anion gap  4  3.0 - 18 mmol/L       Glucose  124 (H)  74 - 99 mg/dL       BUN  18  7.0 - 18 MG/DL       Creatinine  1.610.89  0.6 - 1.3 MG/DL       BUN/Creatinine ratio  20  12 - 20         GFR est AA  >60  >60 ml/min/1.6073m2       GFR est non-AA  >60  >60 ml/min/1.3573m2       Calcium  8.2 (L)  8.5 - 10.1 MG/DL       CBC WITH AUTOMATED DIFF          Collection Time: 08/11/16  2:10 AM         Result  Value  Ref Range            WBC  6.2  4.6 - 13.2 K/uL       RBC  4.45  4.20 - 5.30 M/uL       HGB  11.6 (L)  12.0 - 16.0 g/dL       HCT  09.636.8  04.535.0 - 45.0 %        MCV  82.7  74.0 - 97.0 FL       MCH  26.1  24.0 - 34.0 PG       MCHC  31.5  31.0 - 37.0 g/dL       RDW  40.915.1 (H)  81.111.6 - 14.5 %       PLATELET  230  135 - 420 K/uL       MPV  10.4  9.2 - 11.8 FL       NEUTROPHILS  50  40 - 73 %       LYMPHOCYTES  39  21 - 52 %       MONOCYTES  10  3 - 10 %       EOSINOPHILS  1  0 - 5 %       BASOPHILS  0  0 - 2 %       ABS. NEUTROPHILS  3.0  1.8 - 8.0 K/UL       ABS. LYMPHOCYTES  2.4  0.9 - 3.6 K/UL       ABS. MONOCYTES  0.6  0.05 - 1.2 K/UL       ABS. EOSINOPHILS  0.1  0.0 - 0.4 K/UL       ABS. BASOPHILS  0.0  0.0 - 0.1 K/UL            DF  AUTOMATED           Additional Data Reviewed:       Condition:      Follow-up Appointments:     PCP in one week, follow up with endocrinology as outpatient      >30 minutes spent coordinating this discharge (review instructions/follow-up, prescriptions, preparing report for sign off)      Signed:   Claudius SisKathleen B Reon Hunley, NP   08/11/2016   2:02 PM

## 2016-08-11 NOTE — Consults (Signed)
Full note dictated  Discussed with hospitalist/NP

## 2016-08-11 NOTE — Discharge Summary (Signed)
[facility]  Discharge Summary       Patient: Jodi [name] Age: 56 y.o. DOB: 10-27-1960 MR#: [id number] SSN: [id number]  PCP on record: Phys Other, MD  Admit date: 08/09/2016  Discharge date: 08/11/2016    Disposition:    [] Home   [] Home with Home Health   [] SNF/NH   [] Rehab   [x] Home with family   [] Alternate Facility:____________________    Discharge Diagnoses:                             1.  Atypical chest pain  2.  Headaches in setting of mild sinusitis  3.  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  Potential side effects from Zanaflex  5.  GERD  6.  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  LUE/LLE weakness    Discharge Medications:     Discharge Medication List as of 08/11/2016  6:17 PM      START taking these medications    Details   acetaminophen (TYLENOL) 325 mg tablet Take 2 Tabs by mouth every six (6) hours as needed., No Print, Disp-30 Tab, R-0      aluminum-magnesium hydroxide (MAALOX) 200-200 mg/5 mL suspension Take 15 mL by mouth four (4) times daily as needed for Indigestion. Indications: gastroesophageal reflux disease, No Print, Disp-30 mL, R-0      azithromycin (ZITHROMAX) 250 mg tablet Take 2 Tabs by mouth daily for 4 days., Print, Disp-6 Tab, R-0      benzocaine-menthol (CEPACOL) 15-3.6 mg lozg lozenge 1 Lozenge by Mucous Membrane route as needed., Print, Disp-30 Lozenge, R-0      pantoprazole (PROTONIX) 40 mg tablet Take 1 Tab by mouth Before breakfast and dinner. Indications: gastroesophageal reflux disease, Print, Disp-60 Tab, R-0      aspirin (ASPIRIN) 325 mg tablet Take 1 Tab by mouth daily., Print, Disp-30 Tab, R-0      nicotine (NICODERM CQ) 14 mg/24 hr patch 1 Patch by TransDERmal route daily for 30 days., Print, Disp-30 Patch, R-0      nitroglycerin (NITROSTAT) 0.4 mg SL tablet 1 Tab by SubLINGual route every five (5) minutes as needed for Chest Pain., Print, Disp-1 Bottle, R-0         STOP taking these medications        atorvastatin (LIPITOR) 40 mg tablet Comments:   Reason for Stopping:         diclofenac EC (VOLTAREN) 75 mg EC tablet Comments:   Reason for Stopping:         tiZANidine (ZANAFLEX) 4 mg tablet Comments:   Reason for Stopping:             Consults:    - Endocrinology   - Neurology    Procedures:  -   None    Significant Diagnostic Studies:   -  MRI PITUIT W WO CONT on 08/10/2016  IMPRESSION:  ??  Pituitary gland is borderline size for patient age measuring 8 mm craniocaudal  with convex superior margin. No focal well-defined pituitary mass identified.  -Less enlarged and less avidly enhancing than expected for process such as  pituitary hyperplasia.  -The infundibulum is not significantly thickened to suggest a lymphocytic  hypophysitis.  -Could be normal for this patient.  ??  Trace amount of abnormal dural thickening and enhancement along the ventral  margin of the sella and posterior aspect of  the planum sphenoidale.  -Uncertain significance. Does not appear to be dural reaction related to process  in the pituitary gland but possible.  -An incidental tiny (2 mm thickness) meningioma possible.  ??  Correlation with pituitary laboratory values is needed.  Consider follow-up pituitary MRI to evaluate for stability.    MRI BRAIN W WO CONT on 08/10/2016  IMPRESSIONS:  1. No evidence of acute infarction or acute ischemic process in brain.    2. The pituitary gland is mildly enlarged. Findings suspicious for pituitary  adenoma measuring 1.1 cm in diameter.  Follow-up evaluation with follow-up dedicated MRI of pituitary gland, without  and with contrast, is recommended.    3. No additional intra-axial or extra-axial mass lesion or mass effect.    CT HEAD WO CONT on 08/09/2016  IMPRESSION:    ??  Small symmetric bilateral basal ganglia infarcts are age indeterminate. If  determination of acuity is desired, recommend MR brain.  Mild sinonasal mucosal disease.    XR CHEST SNGL V on 08/09/2016  IMPRESSION:  ??   No acute pulmonary disease.    Hospital Course by Problem   1.  Atypical chest pain -  Presented with complaint of worsening burning pain to chest for past 3-4 weeks, unclear if exacerbated by activity.  Cardiac enzymes and EKG without acute concern.  Discussed and offered option for stress test while inpatient, however patient defers and wishes to complete as outpatient.  Encourage outpatient completion of stress test r/o cardiac angina.  Provided with nitroglycerin as needed.   2.  Headaches in setting of mild sinusitis - Reports generalized headaches and with evidence of sinusitis on CT scan.  Course of azithromax started while inpatient with plan to complete on discharge.  Consider impact of borderline sized pituitary and potential small meningioma.    3.  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  TSH, Free T4, insulin like growth factor, estradiol and random cortisol levels were normal.  Recommend outpatient endocrinology follow up.   4.  Potential side effects from Zanaflex - Patient reporting prior to admission malaise, cognitive issues and blurred vision.  Patient no complaining of this during admission, consider reaction to zanaflex as started taking just prior to admission.   5.  GERD - presents with burning type pain for which patient taking multiple tums with relief.  Patient was started on protonix during admission with relief of symptoms.   6.  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 Discussed stopping zanaflex, follow with PT and ortho Dr. Magnus IvanBlackman as outpatient  7.  LUE/LLE weakness - no evidence of CVA per imaging and neurology.  ? Effect of work injury, follow as outpatient    Today's examination of the patient revealed:     Subjective:   Continues to have headaches at times, no other current complaints.  States  reflux type symptoms much improved with use of PPI.      Denies chest pain / pressure, dizziness, shortness of breath.  She states she has never been on statin or been told high cholesterol.    Objective:   VS:   Visit Vitals   ??? BP 113/70 (BP 1 Location: Left arm, BP Patient Position: At rest)   ??? Pulse 73   ??? Temp 97 ??F (36.1 ??C)   ??? Resp 18   ???  Ht 5\' 4"  (1.626 m)   ??? Wt 92.1 kg (203 lb)   ??? SpO2 96%   ??? BMI 34.84 kg/m2      Tmax/24hrs: Temp (24hrs), Avg:97.4 ??F (36.3 ??C), Min:97 ??F (36.1 ??C), Max:98 ??F (36.7 ??C)     Input/Output:   Intake/Output Summary (Last 24 hours) at 08/11/16 1402  Last data filed at 08/11/16 0000   Gross per 24 hour   Intake              720 ml   Output                0 ml   Net              720 ml     General:  Alert, NAD  Cardiovascular:  RRR  Pulmonary:  LSC throughout  GI:  +BS in all four quadrants, soft, non-tender  Extremities:  No edema; 2+ dorsalis pedis pulses bilaterally  Neuro: oriented x 4; 3/5 LUE/LLE weakness    Labs:    Recent Results (from the past 24 hour(s))   CARDIAC PANEL,(CK, CKMB & TROPONIN)    Collection Time: 08/10/16  3:01 PM   Result Value Ref Range    CK 149 26 - 192 U/L    CK - MB 1.9 <3.6 ng/ml    CK-MB Index 1.3 0.0 - 4.0 %    Troponin-I, Qt. <0.02 0.0 - 0.045 NG/ML   CORTISOL    Collection Time: 08/10/16  3:01 PM   Result Value Ref Range    Cortisol, random 3.2 3.09 - 22.40 ug/dL   ESTRADIOL    Collection Time: 08/10/16  3:01 PM   Result Value Ref Range    Estradiol <5.0 pg/mL   TSH 3RD GENERATION    Collection Time: 08/10/16  3:01 PM   Result Value Ref Range    TSH 0.98 0.36 - 3.74 uIU/mL   T4, FREE    Collection Time: 08/11/16  2:10 AM   Result Value Ref Range    T4, Free 1.0 0.7 - 1.5 NG/DL   METABOLIC PANEL, BASIC    Collection Time: 08/11/16  2:10 AM   Result Value Ref Range    Sodium 141 136 - 145 mmol/L    Potassium 3.5 3.5 - 5.5 mmol/L    Chloride 108 100 - 108 mmol/L    CO2 29 21 - 32 mmol/L    Anion gap 4 3.0 - 18 mmol/L     Glucose 124 (H) 74 - 99 mg/dL    BUN 18 7.0 - 18 MG/DL    Creatinine 1.61 0.6 - 1.3 MG/DL    BUN/Creatinine ratio 20 12 - 20      GFR est AA >60 >60 ml/min/1.41m2    GFR est non-AA >60 >60 ml/min/1.66m2    Calcium 8.2 (L) 8.5 - 10.1 MG/DL   CBC WITH AUTOMATED DIFF    Collection Time: 08/11/16  2:10 AM   Result Value Ref Range    WBC 6.2 4.6 - 13.2 K/uL    RBC 4.45 4.20 - 5.30 M/uL    HGB 11.6 (L) 12.0 - 16.0 g/dL    HCT 09.6 04.5 - 40.9 %    MCV 82.7 74.0 - 97.0 FL    MCH 26.1 24.0 - 34.0 PG    MCHC 31.5 31.0 - 37.0 g/dL    RDW 81.1 (H) 91.4 - 14.5 %    PLATELET 230 135 - 420 K/uL    MPV 10.4  9.2 - 11.8 FL    NEUTROPHILS 50 40 - 73 %    LYMPHOCYTES 39 21 - 52 %    MONOCYTES 10 3 - 10 %    EOSINOPHILS 1 0 - 5 %    BASOPHILS 0 0 - 2 %    ABS. NEUTROPHILS 3.0 1.8 - 8.0 K/UL    ABS. LYMPHOCYTES 2.4 0.9 - 3.6 K/UL    ABS. MONOCYTES 0.6 0.05 - 1.2 K/UL    ABS. EOSINOPHILS 0.1 0.0 - 0.4 K/UL    ABS. BASOPHILS 0.0 0.0 - 0.1 K/UL    DF AUTOMATED       Additional Data Reviewed:     Condition:   Follow-up Appointments:   PCP in one week, follow up with endocrinology as outpatient    >30 minutes spent coordinating this discharge (review instructions/follow-up, prescriptions, preparing report for sign off)    Signed:  Claudius Sis, NP  08/11/2016  2:02 PM

## 2016-08-11 NOTE — Consults (Signed)
[facility]  CONSULTATION    Name:Jodi [name]  MR#: [id number]  DOB: [birth year]  ACCOUNT #: [id number]   DATE OF SERVICE: 08/11/2016    REASON FOR CONSULTATION:  Stroke, .    HISTORY OF PRESENT ILLNESS:  This patient presented yesterday and was seen by teleneurology with an unclear history of stroke by teleneurology report and I confirmed this vague that is not convincing for stroke.  She has had a prodromal symptomatology over the last month to 2 months of substernal burning discomfort, at times pressure-like, nausea and a feeling of malaise.  Perhaps triggered with activity.  Confusing to the patient as she has also been dealing with heavy stress related to dealing with an elderly mother, as well as other family members that are having social stressors and she is "the rock" of the family.  She has been prescribed a muscle relaxer, tizanidine sometime ago, but had not taken it until the day of presentation.  She reported feeling a malaise, blurring of vision, cognitive difficulty, back achiness and cognitive difficulty associated with this.  She has ongoing tobacco use and I counseled her in no uncertain terms for her long-term health, she needs to stop.  There is a positive family history of vascular disease.    PHYSICAL EXAMINATION:  VITAL SIGNS:  Shows blood pressure 113/70, nontachypneic, pulse 73, it is regular.   GENERAL:  Pleasant, alert.    HEENT:  A prosthetic right eye.  Visual fields in left eye is intact.  Face is symmetric.  No other cranial nerve neuropathy.  No droop.  Finger tapping intact.  Cognition is normal.  Reflexes are symmetric, nonpathologic, no sensory loss to cortical modalities.      MRI brain shows no evidence of stroke.  A 2D echocardiogram showed normal ejection fraction.  An incidental finding of a pituitary territory enlargement was made and an evaluation of this has been undertaken.    ASSESSMENT:  The patient's clinical presentation is not consistent with  stroke or TIA.  Brain imaging shows no evidence of stroke.  An incidental finding of a pituitary enlargement.  Discussed with the hospitalist, the nurse practitioner to consider the possibility of cardiac angina that has been ongoing for the last few months in a 56 year old black smoking lady.  Otherwise, I do not believe the patient is having brain ischemia.  I believe her symptomatology is likely related to the fact that she had taken Zanaflex.  No further recommendations and will sign off the case.      Jaeda Bruso L. Dia CrawfordINGLER, II, MD       WLT / SN  D: 08/11/2016 13:06     T: 08/11/2016 15:29  JOB #: [id number]

## 2016-08-11 NOTE — Consults (Signed)
Full note dictated  Discussed with hospitalist/NP

## 2016-08-11 NOTE — Progress Notes (Signed)
Pt D/C home with husband.  Iv removed and discharge instructions given along with RX. Taken by wheelchair

## 2016-08-12 LAB — INSULIN-LIKE GROWTH FACTOR 1: Insulin-Like Growth Factor I: 97 ng/mL (ref 46–172)

## 2016-08-13 LAB — ACTH: ACTH, plasma: 4.3 pg/mL — ABNORMAL LOW (ref 7.2–63.3)

## 2016-08-14 ENCOUNTER — Ambulatory Visit (INDEPENDENT_AMBULATORY_CARE_PROVIDER_SITE_OTHER): Payer: Medicare Other | Admitting: Family Medicine

## 2016-08-14 ENCOUNTER — Encounter: Payer: Self-pay | Admitting: Family Medicine

## 2016-08-14 VITALS — BP 102/64 | HR 86 | Temp 98.3°F | Ht 64.0 in | Wt 225.4 lb

## 2016-08-14 DIAGNOSIS — R002 Palpitations: Secondary | ICD-10-CM

## 2016-08-14 DIAGNOSIS — M791 Myalgia, unspecified site: Secondary | ICD-10-CM

## 2016-08-14 DIAGNOSIS — E237 Disorder of pituitary gland, unspecified: Secondary | ICD-10-CM

## 2016-08-14 DIAGNOSIS — Z09 Encounter for follow-up examination after completed treatment for conditions other than malignant neoplasm: Secondary | ICD-10-CM

## 2016-08-14 DIAGNOSIS — E119 Type 2 diabetes mellitus without complications: Secondary | ICD-10-CM | POA: Diagnosis not present

## 2016-08-14 LAB — POCT GLYCOSYLATED HEMOGLOBIN (HGB A1C): Hemoglobin A1C: 6.6

## 2016-08-14 NOTE — Patient Instructions (Signed)
Thank you so much for coming to visit today! I'm glad you are doing better since your hospitalization and I hope you continue to improve. Please sign a Release of Information form so we can get the records from [place]. I have placed referrals to Cardiology and Endocrinology. Please let me know if you don't hear from them soon! We will check your cholesterol today. [facility] is not here today. I will have them call you. I will give you a list of therapists in the area. I recommend you follow up within the next several weeks for a visit focused solely on your anxiety and stress.   Dr. Gerlean Ren

## 2016-08-15 ENCOUNTER — Ambulatory Visit (INDEPENDENT_AMBULATORY_CARE_PROVIDER_SITE_OTHER): Payer: Medicaid Other | Admitting: Orthopedic Surgery

## 2016-08-15 ENCOUNTER — Ambulatory Visit (INDEPENDENT_AMBULATORY_CARE_PROVIDER_SITE_OTHER): Payer: Worker's Compensation | Admitting: Orthopaedic Surgery

## 2016-08-15 LAB — FOLLICLE STIMULATING HORMONE: FSH: 32.4 m[IU]/mL

## 2016-08-15 LAB — PROLACTIN
Prolactin: 7.8 ng/mL
Prolactin: 9 ng/mL

## 2016-08-15 MED ORDER — DICLOFENAC SODIUM 1 % TD GEL: 2.0000 g | Freq: Every day | TRANSDERMAL | 2 refills | Status: DC | PRN

## 2016-08-16 NOTE — Progress Notes (Signed)
Subjective:     Patient ID: Alyssa Montgomery, female   DOB: December 19, 1960, 56 y.o.   MRN: [id number]  HPI Alyssa Montgomery is a 56yo female presenting today for hospital follow up. Hospitalized in [place] from 3/1-3/4 after taking a Zanaflex and then passing out. Noted history of left upper extremity and lower extremity weakness and numbness for several weeks. Hospital records not available. Contacted Nurse Practitioner who cared for her during her hospitalization. Reports she was seen by Cardiology due to palpitations and chest heaviness and they recommend follow up with Cardiology once she returned home for a stress test. Also notes MRI brain showed possible pituitary adenoma, however a Pituitary MRI showed borderline normal Pituitary with possible small meningioma. Recommends follow up with Endocrinology. Was seen by Neurology who believed her hospitalization was secondary to adverse reaction to Zanaflex. Continues to note left sided weakness and numbness, but denies any further syncopal episodes. Requests refill of Voltaren gel since she is no longer using Zanaflex--has worked well for her in the past. Does not have her medications with her today.  Review of Systems Per HPI    Objective:   Physical Exam  Constitutional: She is oriented to person, place, and time. She appears well-developed and well-nourished. No distress.  Cardiovascular: Normal rate and regular rhythm.   No murmur heard. Pulmonary/Chest: Effort normal. No respiratory distress. She has no wheezes.  Abdominal: Soft. She exhibits no distension. There is no tenderness.  Musculoskeletal: She exhibits no edema.  Neurological: She is alert and oriented to person, place, and time. No cranial nerve deficit.  Muscle strength 5/5 in right upper and lowe extremity, 4/5 in left upper and lower extremity  Psychiatric: She has a normal mood and affect. Her behavior is normal.      Assessment and Plan:     1. Hospital discharge  follow-up Hospitalized in [place] with notes not available. To sign release of information form so records can be obtained. Was planning lab work including lipid panel, but refuses labs except for A1C today. Will need to review medications once she has list or returns with medications.  2. Palpitations Referral to Cardiology placed. Inpatient Cardiologist in [place] recommended outpatient stress test.  3. Pituitary abnormality [facility]) Referral to Endocrinology placed. MRI brain showed possible pituitary adenoma, however a Pituitary MRI showed borderline normal Pituitary with possible small meningioma--will document official imaging results once outside records obtained.  4. Muscle pain Hold Zanaflex. Voltaren gel refilled.

## 2016-08-17 NOTE — Telephone Encounter (Signed)
Contacted pt to be sure that everything had been taken care of and that she had been let go from hospital and she said she had and that she had been in to see Dr. Gerlean Ren this week.  Also she said she needed to schedule an appointment to come back and we scheduled this for after her 2 referral visits. That appointment is on 4/2, also pt stated the Dr. Gerlean Ren had mentioned getting labs and pt wanted to know if she needed to get these before her visit or if they could be done on the day of the visit. I checked and did not see any labs ordered. Routing to PCP to be advised.  Katharina Caper, Shajuana Mclucas D, [place]

## 2016-08-23 ENCOUNTER — Encounter: Payer: Self-pay | Admitting: Cardiology

## 2016-08-30 NOTE — Telephone Encounter (Signed)
Patient called and wanted to know what the status of her physical therapy. She is upset that no one has called her back. CB#[phone].  Thank you.

## 2016-09-03 ENCOUNTER — Encounter: Payer: Self-pay | Admitting: Cardiology

## 2016-09-03 ENCOUNTER — Ambulatory Visit: Payer: Self-pay | Admitting: Internal Medicine

## 2016-09-03 ENCOUNTER — Ambulatory Visit (INDEPENDENT_AMBULATORY_CARE_PROVIDER_SITE_OTHER): Payer: Medicare Other | Admitting: Cardiology

## 2016-09-03 VITALS — BP 124/80 | HR 80 | Ht 64.0 in | Wt 223.0 lb

## 2016-09-03 DIAGNOSIS — I739 Peripheral vascular disease, unspecified: Secondary | ICD-10-CM

## 2016-09-03 DIAGNOSIS — R55 Syncope and collapse: Secondary | ICD-10-CM

## 2016-09-03 DIAGNOSIS — R002 Palpitations: Secondary | ICD-10-CM

## 2016-09-03 HISTORY — DX: Peripheral vascular disease, unspecified: I73.9

## 2016-09-03 NOTE — Telephone Encounter (Signed)
I spoke to the patient weeks ago. I let her know that she only needed to call and make the appointment, the PT would enter her as a self pay and she would submit the bills to her employer. I gave her # to set it up.

## 2016-09-03 NOTE — Patient Instructions (Signed)
Medication Instructions:   Your physician recommends that you continue on your current medications as directed. Please refer to the Current Medication list given to you today.    Testing/Procedures:  Your physician has requested that you have a lower  extremity arterial duplex. This test is an ultrasound of the arteries in the legs or arms. It looks at arterial blood flow in the legs and arms. Allow one hour for Lower and Upper Arterial scans. There are no restrictions or special instructions   Your physician has requested that you have an echocardiogram. Echocardiography is a painless test that uses sound waves to create images of your heart. It provides your doctor with information about the size and shape of your heart and how well your heart's chambers and valves are working. This procedure takes approximately one hour. There are no restrictions for this procedure.     Follow-Up:  3 MONTHS WITH DR Meda Coffee       If you need a refill on your cardiac medications before your next appointment, please call your pharmacy.

## 2016-09-03 NOTE — Progress Notes (Signed)
Cardiology Office Note    Date:  09/03/2016   ID:  Devlynn, Knoff 08-15-1960, MRN [id number]  PCP:  Junie Panning, DO  Cardiologist:  Ena Dawley, MD  Referring physician: Junie Panning, DO   Chief complain: Post ER visit, syncope, palpitations  History of Present Illness:  Alyssa Montgomery is a 56 y.o. female who is being referred to [place] after a visit to the ER on 08/09/2016. The patient was started on muscle spasm medicine tiZANidine (ZANAFLEX) and several hours later while just sitting she passed out. Her palpitations or chest pain prior to this episode. In the hospital her EKG showed no acute changes. Her troponin was negative her blood pressure is well controlled and MRI of the brain showed no evidence of acute infarction or stroke, mildly enlarged pituitary gland suspicious for adenoma. The patient was discharged home and referred to [place]. Today she states that she hasn't had any syncopal episodes afterwards and prior to this episode not in many years. She is fairly active and denies any chest pain or shortness of breath on activity other than when she carries multiple shopping bags and walking fast. She has any lower extremity edema orthopnea or proximal nocturnal dyspnea. She has noticed pain in her left lower extremity that can be present while she is at rest and also on exertion. It's worse when she elevates her leg.  He has also been experiencing episodes of palpitations that started all of a sudden and stop all the sudden and can last 2-5 minutes. They're not associated with dizziness shortness of breath or chest pain. She says that those have resolved and hasn't felt any in the last week. In her mother side of the family there are multiple people with heart disease but she doesn't know any further details, she seems to also have peripheral arterial disease. She has h/o cocaine use. Negative on toxicology panel in the ER.  Past Medical History:  Diagnosis Date  . Anxiety   .  Bronchitis   . Diverticulitis   . Fibromyalgia   . GERD (gastroesophageal reflux disease)   . Headache   . Substance abuse    10 years ago ( cocaine)   Past Surgical History:  Procedure Laterality Date  . ANKLE SURGERY    . BACK SURGERY    . Bowel obstruction    . CERVICAL CONIZATION W/BX N/A 12/27/2015   Procedure: CONIZATION CERVIX WITH BIOPSY;  Surgeon: Terrance Mass, MD;  Location: [facility];  Service: Gynecology;  Laterality: N/A;  . DILATATION & CURETTAGE/HYSTEROSCOPY WITH MYOSURE N/A 12/27/2015   Procedure: DILATATION & CURETTAGE/HYSTEROSCOPY WITH MYOSURE;  Surgeon: Terrance Mass, MD;  Location: [facility];  Service: Gynecology;  Laterality: N/A;  . ECTOPIC PREGNANCY SURGERY    . EYE SURGERY     removed right eye;    Current Medications: Outpatient Medications Prior to Visit  Medication Sig Dispense Refill  . acetaminophen (TYLENOL) 325 MG tablet Take 650-975 mg by mouth 2 (two) times daily as needed for mild pain.    Marland Kitchen albuterol (PROVENTIL HFA;VENTOLIN HFA) 108 (90 BASE) MCG/ACT inhaler Inhale 2 puffs into the lungs every 6 (six) hours as needed for wheezing. 1 Inhaler 0  . Artificial Tear Solution (SOOTHE HYDRATION OP) Place 1 Dose into the left eye 2 (two) times daily. Reported on 12/26/2015    . bismuth subsalicylate (PEPTO BISMOL) 262 MG/15ML suspension Take 30 mLs by mouth every 6 (six) hours as needed for indigestion.    Marland Kitchen  Ca Carbonate-Mag Hydroxide (ROLAIDS PO) Take 1-2 tablets by mouth 4 (four) times daily as needed (For indigestion.). Reported on 12/26/2015    . Cholecalciferol (D3-1000) 1000 units tablet Take 1,000 Units by mouth daily. Reported on 12/26/2015    . Coenzyme Q10 (COQ-10) 200 MG CAPS Take 1 capsule by mouth daily. Reported on 12/26/2015    . diclofenac sodium (VOLTAREN) 1 % GEL Apply 2 g topically daily as needed (For muscle pain.). Reported on 12/26/2015 1 Tube 2  . Menthol-Methyl Salicylate (MUSCLE RUB) 10-15 % CREA Apply 1 application topically as needed for  muscle pain. Reported on 12/26/2015    . Multiple Vitamin (MULTIVITAMIN WITH MINERALS) TABS tablet Take 1 tablet by mouth daily. Reported on 12/26/2015    . ofloxacin (OCUFLOX) 0.3 % ophthalmic solution Place 2 drops into the left eye daily. Reported on 12/26/2015    . promethazine (PHENERGAN) 25 MG tablet Take 25 mg by mouth every 6 (six) hours as needed for nausea or vomiting. Reported on 12/26/2015    . simethicone (MYLICON) 127 MG chewable tablet Chew 125 mg by mouth every 6 (six) hours as needed for flatulence. Reported on 12/26/2015    . clindamycin (CLEOCIN) 2 % vaginal cream Place 1 Applicatorful vaginally at bedtime. (Patient not taking: Reported on 07/18/2016) 40 g 0  . cyclobenzaprine (FLEXERIL) 10 MG tablet Take 1 tablet (10 mg total) by mouth 2 (two) times daily as needed for muscle spasms. (Patient not taking: Reported on 07/18/2016) 20 tablet 0  . cyclobenzaprine (FLEXERIL) 5 MG tablet Take 5 mg by mouth 3 (three) times daily as needed for muscle spasms. Reported on 12/26/2015    . diclofenac (VOLTAREN) 75 MG EC tablet Take 1 tablet (75 mg total) by mouth 2 (two) times daily. 20 tablet 0  . methylPREDNISolone (MEDROL) 4 MG tablet Medrol dose pack. Take as instructed 21 tablet 0  . naproxen (NAPROSYN) 500 MG tablet Take 500 mg by mouth 2 (two) times daily with a meal. Reported on 12/26/2015    . tiZANidine (ZANAFLEX) 4 MG tablet Take 1 tablet (4 mg total) by mouth every 8 (eight) hours as needed for muscle spasms. 60 tablet 0  . traMADol (ULTRAM) 50 MG tablet Take 1 tablet (50 mg total) by mouth every 8 (eight) hours as needed for moderate pain. Reported on 12/26/2015 (Patient not taking: Reported on 07/18/2016) 30 tablet 0  . traMADol (ULTRAM) 50 MG tablet Take 1 tablet (50 mg total) by mouth every 6 (six) hours as needed for moderate pain. (Patient not taking: Reported on 07/18/2016) 30 tablet 0   No facility-administered medications prior to visit.     Allergies:   Zanaflex [tizanidine hcl];  Penicillins; and Sulfonamide derivatives   Social History   Social History  . Marital status: Married    Spouse name: N/A  . Number of children: N/A  . Years of education: N/A   Social History Main Topics  . Smoking status: Current Every Day Smoker    Packs/day: 0.50    Years: 18.00    Types: Cigarettes  . Smokeless tobacco: Never Used     Comment: 1-800 number given   . Alcohol use 0.6 oz/week    1 Glasses of wine per week     Comment: OCCASIONAL  . 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 Sexual activity: Yes    Partners: Male    Birth control/ protection: Post-menopausal   Other Topics Concern  .  None   Social History Narrative  . None    Family History:  The patient's family history includes Arthritis in her mother; Cancer in her father; Hypertension in her mother.   ROS:   Please see the history of present illness.    ROS All other systems reviewed and are negative.   PHYSICAL EXAM:   VS:  BP 124/80   Pulse 80   Ht 5\' 4"  (1.626 m)   Wt 223 lb (101.2 kg)   BMI 38.28 kg/m    GEN: Well nourished, well developed, in no acute distress  HEENT: normal  Neck: no JVD, carotid bruits, or masses Cardiac: RRR; no murmurs, rubs, or gallops,no edema, weak DP and PT pulses Respiratory:  clear to auscultation bilaterally, normal work of breathing GI: soft, nontender, nondistended, + BS MS: no deformity or atrophy  Skin: warm and dry, no rash Neuro:  Alert and Oriented x 3, Strength and sensation are intact Psych: euthymic mood, full affect  Wt Readings from Last 3 Encounters:  09/03/16 223 lb (101.2 kg)  08/14/16 225 lb 6.4 oz (102.2 kg)  02/21/16 225 lb 3.2 oz (102.2 kg)    Studies/Labs Reviewed:   EKG:  EKG is ordered today.  The ekg ordered today demonstrates Normal sinus rhythm with left axis deviation with right bundle branch block and nonspecific ST-T wave abnormalities. EKG was personally reviewed.  Recent Labs: 12/28/2015: ALT 22; BUN  18; Creatinine, Ser 0.75; Hemoglobin 13.2; Platelets 244; Potassium 4.0; Sodium 139   Lipid Panel    Component Value Date/Time   CHOL 178 01/22/2012 1106   TRIG 111 01/22/2012 1106   HDL 54 01/22/2012 1106   CHOLHDL 3.3 01/22/2012 1106   VLDL 22 01/22/2012 1106   LDLCALC 102 (H) 01/22/2012 1106   LDLDIRECT 106.4 10/11/2006 1347    Additional studies/ records that were reviewed today include:  Echo -ordered   ASSESSMENT:    1. Syncope, unspecified syncope type   2. Claudication ([place])   3. Near syncope   4. Palpitations     PLAN:  In order of problems listed above:  The patient had isolated episode of syncope that was most probably reaction to a new medicine, muscle relaxant TIZANIDINE (ZANAFLEX) . At the time she didn't feel any chest pain or shortness of breath. She has also been experiencing palpitations but currently resolved these are most probably short episode of SVT, they're not associated with any other symptoms so most probably not ventricular tachycardia. She is advised to call us back when she has episodes of palpitations again, we will obtain event monitor at that time. In the meantime we will obtain an echocardiogram as she never had any to evaluate for LVEF. The patient also has history of cocaine abuse, we will obtain an echocardiogram.  She is concerned about left lower extremity pain that seems to be typical for schiaica, however she also has weak peripheral pulses and family history of peripheral arterial disease, will obtain peripheral arterial duplex bilaterally and if normal refer to neurosurgery.  Medication Adjustments/Labs and Tests Ordered: Current medicines are reviewed at length with the patient today.  Concerns regarding medicines are outlined above.  Medication changes, Labs and Tests ordered today are listed in the Patient Instructions below. Patient Instructions  Medication Instructions:   Your physician recommends that you continue on your current  medications as directed. Please refer to the Current Medication list given to you today.    Testing/Procedures:  Your physician has requested that  you have a lower  extremity arterial duplex. This test is an ultrasound of the arteries in the legs or arms. It looks at arterial blood flow in the legs and arms. Allow one hour for Lower and Upper Arterial scans. There are no restrictions or special instructions   Your physician has requested that you have an echocardiogram. Echocardiography is a painless test that uses sound waves to create images of your heart. It provides your doctor with information about the size and shape of your heart and how well your heart's chambers and valves are working. This procedure takes approximately one hour. There are no restrictions for this procedure.     Follow-Up:  3 MONTHS WITH DR Meda Coffee       If you need a refill on your cardiac medications before your next appointment, please call your pharmacy.      Signed, Ena Dawley, MD  09/03/2016 4:02 PM    [facility] [address], [place], [place]  [address] Phone: [phone]; Fax: [phone]

## 2016-09-03 NOTE — Telephone Encounter (Signed)
We did try to contact her about this didn't we?

## 2016-09-03 NOTE — Telephone Encounter (Signed)
LMOM for patient stating I see that she spoke with Lattie Haw about PT a couple weeks ago and to call the number she gave her to setup therapy

## 2016-09-04 NOTE — Addendum Note (Signed)
Addended by: Marlis Edelson C on: 09/04/2016 11:57 AM   Modules accepted: Orders

## 2016-09-06 ENCOUNTER — Ambulatory Visit (INDEPENDENT_AMBULATORY_CARE_PROVIDER_SITE_OTHER): Payer: Medicare Other | Admitting: Endocrinology

## 2016-09-06 ENCOUNTER — Encounter: Payer: Self-pay | Admitting: Endocrinology

## 2016-09-06 VITALS — BP 130/62 | HR 82 | Ht 64.0 in | Wt 226.0 lb

## 2016-09-06 DIAGNOSIS — R6889 Other general symptoms and signs: Secondary | ICD-10-CM | POA: Diagnosis not present

## 2016-09-06 DIAGNOSIS — E237 Disorder of pituitary gland, unspecified: Secondary | ICD-10-CM | POA: Diagnosis not present

## 2016-09-06 DIAGNOSIS — J329 Chronic sinusitis, unspecified: Secondary | ICD-10-CM

## 2016-09-06 NOTE — Patient Instructions (Addendum)
Please see 2 specialists.  you will receive a phone call, about days and times for appointments.  blood tests are requested for you today.  We'll let you know about the results.  I would be happy to see you back here as needed.

## 2016-09-06 NOTE — Progress Notes (Signed)
Subjective:    Patient ID: Alyssa Montgomery, female    DOB: 05-04-1961, 56 y.o.   MRN: [id number]  HPI 1 month ago, pt was hospitalized in [place], with moderate palpitations in the chest, and assoc ataxia.  eval on MRI raised ? of pituitary enlargement. No cause was found.  She feels better now.   Past Medical History:  Diagnosis Date  . Anxiety   . Bronchitis   . Diverticulitis   . Fibromyalgia   . GERD (gastroesophageal reflux disease)   . Headache   . Substance abuse    10 years ago ( cocaine)    Past Surgical History:  Procedure Laterality Date  . ANKLE SURGERY    . BACK SURGERY    . Bowel obstruction    . CERVICAL CONIZATION W/BX N/A 12/27/2015   Procedure: CONIZATION CERVIX WITH BIOPSY;  Surgeon: Terrance Mass, MD;  Location: [facility];  Service: Gynecology;  Laterality: N/A;  . DILATATION & CURETTAGE/HYSTEROSCOPY WITH MYOSURE N/A 12/27/2015   Procedure: DILATATION & CURETTAGE/HYSTEROSCOPY WITH MYOSURE;  Surgeon: Terrance Mass, MD;  Location: [facility];  Service: Gynecology;  Laterality: N/A;  . ECTOPIC PREGNANCY SURGERY    . EYE SURGERY     removed right eye;     Social History   Social History  . Marital status: Married    Spouse name: N/A  . Number of children: N/A  . Years of education: N/A   Occupational History  . Not on file.   Social History Main Topics  . Smoking status: Current Every Day Smoker    Packs/day: 0.50    Years: 18.00    Types: Cigarettes  . Smokeless tobacco: Never Used     Comment: 1-800 number given   . Alcohol use 0.6 oz/week    1 Glasses of wine per week     Comment: OCCASIONAL  . 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 Sexual activity: Yes    Partners: Male    Birth control/ protection: Post-menopausal   Other Topics Concern  . Not on file   Social History Narrative  . No narrative on file    Current Outpatient Prescriptions on File Prior to Visit  Medication Sig Dispense Refill  . acetaminophen (TYLENOL)  325 MG tablet Take 650-975 mg by mouth 2 (two) times daily as needed for mild pain.    Marland Kitchen albuterol (PROVENTIL HFA;VENTOLIN HFA) 108 (90 BASE) MCG/ACT inhaler Inhale 2 puffs into the lungs every 6 (six) hours as needed for wheezing. 1 Inhaler 0  . Artificial Tear Solution (SOOTHE HYDRATION OP) Place 1 Dose into the left eye 2 (two) times daily. Reported on 12/26/2015    . bismuth subsalicylate (PEPTO BISMOL) 262 MG/15ML suspension Take 30 mLs by mouth every 6 (six) hours as needed for indigestion.    . Ca Carbonate-Mag Hydroxide (ROLAIDS PO) Take 1-2 tablets by mouth 4 (four) times daily as needed (For indigestion.). Reported on 12/26/2015    . Cholecalciferol (D3-1000) 1000 units tablet Take 1,000 Units by mouth daily. Reported on 12/26/2015    . Coenzyme Q10 (COQ-10) 200 MG CAPS Take 1 capsule by mouth daily. Reported on 12/26/2015    . Menthol-Methyl Salicylate (MUSCLE RUB) 10-15 % CREA Apply 1 application topically as needed for muscle pain. Reported on 12/26/2015    . Multiple Vitamin (MULTIVITAMIN WITH MINERALS) TABS tablet Take 1 tablet by mouth daily. Reported on 12/26/2015    . ofloxacin (OCUFLOX)  0.3 % ophthalmic solution Place 2 drops into the left eye daily. Reported on 12/26/2015    . promethazine (PHENERGAN) 25 MG tablet Take 25 mg by mouth every 6 (six) hours as needed for nausea or vomiting. Reported on 12/26/2015    . simethicone (MYLICON) 010 MG chewable tablet Chew 125 mg by mouth every 6 (six) hours as needed for flatulence. Reported on 12/26/2015    . diclofenac sodium (VOLTAREN) 1 % GEL Apply 2 g topically daily as needed (For muscle pain.). Reported on 12/26/2015 (Patient not taking: Reported on 09/06/2016) 1 Tube 2   No current facility-administered medications on file prior to visit.     Allergies  Allergen Reactions  . Zanaflex [Tizanidine Hcl] Other (See Comments)    Syncope  . Penicillins Other (See Comments)    Unknown childhood reaction.  . Sulfonamide Derivatives Itching     Family History  Problem Relation Age of Onset  . Cancer Father     lung  . Hypertension Mother   . Arthritis Mother   . 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 She has frequent urination.  Depression is well-controlled.  She has intermittent headache.  She has chronic nasal congestion and forgetfulness.     Objective:   Physical Exam VS: see vs page GEN: no distress HEAD: head: no deformity eyes: no periorbital swelling, no proptosis external nose and ears are normal mouth: no lesion seen NECK: supple, thyroid is not enlarged CHEST WALL: no deformity LUNGS: clear to auscultation CV: reg rate and rhythm, no murmur ABD: abdomen is soft, nontender.  no hepatosplenomegaly.  not distended.  no hernia.  No striae MUSCULOSKELETAL: muscle bulk and strength are grossly normal.  no obvious joint swelling.  gait is normal and steady EXTEMITIES: no deformity.  no ulcer on the feet.  feet are of normal color and temp.  no edema PULSES: dorsalis pedis intact bilat.  no carotid bruit NEURO:  cn 2-12 grossly intact.   readily moves all 4's.  sensation is intact to touch on the feet SKIN:  Normal texture and temperature.  No rash or suspicious lesion is visible.   NODES:  None palpable at the neck.  PSYCH: alert, well-oriented.  Does not appear anxious nor depressed.   MRI brain: slight nonspecific pituitary enlargement.   outside test results are reviewed: Prolactin=9 TSH and free T4 are normal.  FSH=32 Ca++=8.2 IGF-1=97 BMET=normal ACTH=4.3 Cortisol=3  I personally reviewed electrocardiogram tracing (09/03/16): Indication: syncope Impression: NSR.  No MI.  No hypertrophy.  IRBBB Compared  to 08/09/16: abnormalities are new     Assessment & Plan:  Hypocalcemia, new to me. Check vit-D and PTH Sinusitis: pt requests to see specialist Memory loss, mild: pt requests to see specialist Pituitary enlargement: mild.  Normal pituitary function.  No f/u is needed.  Patient Instructions  Please see 2 specialists.  you will receive a phone call, about days and times for appointments.  blood tests are requested for you today.  We'll let you know about the results.  I would be happy to see you back here as needed.

## 2016-09-10 ENCOUNTER — Encounter: Payer: Self-pay | Admitting: Family Medicine

## 2016-09-10 ENCOUNTER — Ambulatory Visit (INDEPENDENT_AMBULATORY_CARE_PROVIDER_SITE_OTHER): Payer: Medicare Other | Admitting: Family Medicine

## 2016-09-10 ENCOUNTER — Encounter: Payer: Self-pay | Admitting: Neurology

## 2016-09-10 DIAGNOSIS — M791 Myalgia, unspecified site: Secondary | ICD-10-CM

## 2016-09-10 DIAGNOSIS — R5383 Other fatigue: Secondary | ICD-10-CM | POA: Diagnosis not present

## 2016-09-10 DIAGNOSIS — R350 Frequency of micturition: Secondary | ICD-10-CM

## 2016-09-10 DIAGNOSIS — E785 Hyperlipidemia, unspecified: Secondary | ICD-10-CM | POA: Diagnosis not present

## 2016-09-10 LAB — POCT URINALYSIS DIP (MANUAL ENTRY)
Bilirubin, UA: NEGATIVE
Glucose, UA: NEGATIVE
Leukocytes, UA: NEGATIVE
Nitrite, UA: NEGATIVE
Protein Ur, POC: NEGATIVE
Urobilinogen, UA: 0.2 (ref ?–2.0)
pH, UA: 5.5 (ref 5.0–8.0)

## 2016-09-10 LAB — POCT UA - MICROSCOPIC ONLY

## 2016-09-10 MED ORDER — DICLOFENAC SODIUM 1 % TD GEL: 2.0000 g | Freq: Every day | TRANSDERMAL | 2 refills | Status: DC | PRN

## 2016-09-10 NOTE — Patient Instructions (Signed)
We will obtain several labs today. It may take some time for these labs to return. Please follow up with ENT, Neurology, and Endocrinology as scheduled.  Dr. Gerlean Ren

## 2016-09-11 ENCOUNTER — Telehealth: Payer: Self-pay | Admitting: Family Medicine

## 2016-09-11 LAB — LIPID PANEL
Chol/HDL Ratio: 3.3 ratio (ref 0.0–4.4)
Cholesterol, Total: 207 mg/dL — ABNORMAL HIGH (ref 100–199)
HDL: 63 mg/dL (ref >39)
LDL Calculated: 91 mg/dL (ref 0–99)
Triglycerides: 264 mg/dL — ABNORMAL HIGH (ref 0–149)
VLDL Cholesterol Cal: 53 mg/dL — ABNORMAL HIGH (ref 5–40)

## 2016-09-11 LAB — CBC
Hematocrit: 39.4 % (ref 34.0–46.6)
Hemoglobin: 12.5 g/dL (ref 11.1–15.9)
MCH: 26.3 pg — ABNORMAL LOW (ref 26.6–33.0)
MCHC: 31.7 g/dL (ref 31.5–35.7)
MCV: 83 fL (ref 79–97)
Platelets: 247 10*3/uL (ref 150–379)
RBC: 4.75 x10E6/uL (ref 3.77–5.28)
RDW: 15.6 % — ABNORMAL HIGH (ref 12.3–15.4)
WBC: 6 10*3/uL (ref 3.4–10.8)

## 2016-09-11 LAB — BASIC METABOLIC PANEL
BUN/Creatinine Ratio: 14 (ref 9–23)
BUN: 13 mg/dL (ref 6–24)
CO2: 25 mmol/L (ref 18–29)
Calcium: 9.1 mg/dL (ref 8.7–10.2)
Chloride: 102 mmol/L (ref 96–106)
Creatinine, Ser: 0.95 mg/dL (ref 0.57–1.00)
GFR calc Af Amer: 77 mL/min/{1.73_m2} (ref >59)
GFR calc non Af Amer: 67 mL/min/{1.73_m2} (ref >59)
Glucose: 117 mg/dL — ABNORMAL HIGH (ref 65–99)
Potassium: 3.8 mmol/L (ref 3.5–5.2)
Sodium: 144 mmol/L (ref 134–144)

## 2016-09-11 LAB — VITAMIN D 25 HYDROXY (VIT D DEFICIENCY, FRACTURES): Vit D, 25-Hydroxy: 16.3 ng/mL — ABNORMAL LOW (ref 30.0–100.0)

## 2016-09-11 LAB — TSH: TSH: 0.83 u[IU]/mL (ref 0.450–4.500)

## 2016-09-11 LAB — PTH, INTACT AND CALCIUM: PTH: 32 pg/mL (ref 15–65)

## 2016-09-11 NOTE — Telephone Encounter (Signed)
Attempted to contact concerning lab results without response. 

## 2016-09-11 NOTE — Progress Notes (Signed)
Subjective:     Patient ID: Renette Butters, female   DOB: 1960/12/23, 56 y.o.   MRN: [id number]  HPI Mrs. Mask is a 56yo female presenting today for labs.  States she went to visit her Endocrinologist who wished to obtain several labs, however they did not have a phlebotomist to obtain her labwork. They requested BMP, CBC, PTH, and Vitamin D. Discussed that normally we do not obtain other office's labs, however since there are multiple labs our office wished to have completed for Mrs. Bernardi at her last office visit (patient refused labs at that visit), we will obtain these labs. Labwork needed for our office included TSH, BMP, CBC, and Lipid Panel. ROS revealed fatigue, increased frequency and urgency. Records now available from [facility] under [place]. BMP on 08/11/16 with mild hypocalcemia of 8.2 (corrects to 8.7 with albumin level)--Endocrinology wishes to have this rechecked. CBC on 3/3 with mild anemia of hemoglobin 11.6, however appears to have decreased during hospitalization (12.9>11.7>11.6); mild decrease with WBC and Platelets, so may be dilution. ACTH low at 4.3, cortisol normal.   Also requests refill of Voltaren gel for knee pain. Has stopped muscle relaxer given adverse reaction. About to start physical therapy and plans to follow up with orthopedics following several physical therapy sessions.   Smoker.  Review of Systems Per HPI    Objective:   Physical Exam  Constitutional: She appears well-developed and well-nourished. No distress.  Cardiovascular: Normal rate and regular rhythm.   No murmur heard. Pulmonary/Chest: Effort normal. No respiratory distress. She has no wheezes.  Psychiatric: She has a normal mood and affect. Her behavior is normal.      Assessment and Plan:     Health Maintenance: Multiple labs needed following hospitalization. Will obtain BMP, TSH, CBC, Lipid Panel, PTH, and Vitamin D level. Follow up with Endocrinology.   Urinary Frequency  and Urgency: Will obtain Urinalysis with Microscopy.  Muscle Pain: Voltaren refilled. Attend physical therapy as scheduled. Follow up with orthopedics.

## 2016-09-19 DIAGNOSIS — H40012 Open angle with borderline findings, low risk, left eye: Secondary | ICD-10-CM | POA: Diagnosis not present

## 2016-09-19 DIAGNOSIS — H04122 Dry eye syndrome of left lacrimal gland: Secondary | ICD-10-CM | POA: Diagnosis not present

## 2016-09-19 DIAGNOSIS — H2512 Age-related nuclear cataract, left eye: Secondary | ICD-10-CM | POA: Diagnosis not present

## 2016-09-19 DIAGNOSIS — Z970 Presence of artificial eye: Secondary | ICD-10-CM | POA: Diagnosis not present

## 2016-09-20 ENCOUNTER — Other Ambulatory Visit (HOSPITAL_COMMUNITY): Payer: Self-pay

## 2016-09-25 ENCOUNTER — Encounter: Payer: Self-pay | Admitting: Cardiology

## 2016-09-26 ENCOUNTER — Telehealth (INDEPENDENT_AMBULATORY_CARE_PROVIDER_SITE_OTHER): Payer: Self-pay | Admitting: Orthopaedic Surgery

## 2016-09-26 NOTE — Telephone Encounter (Signed)
I had spoken to someone at [facility] on [employer] [employer] who said they could see her as self pay and she would need to turn in bills to her employer. That's the location I gave her to call.

## 2016-09-26 NOTE — Telephone Encounter (Signed)
Was there a particular place we were sending her to? I am looking through the chart and not finding it. She wants to know where to go

## 2016-09-26 NOTE — Telephone Encounter (Signed)
Patient called needing to know where should she go for (PT) Patient said she is still working moderate light duty and have not had (PT) yet. Patient said if she can not answer the phone please leave her a detailed message.The number to contact patient is [phone]

## 2016-09-27 NOTE — Telephone Encounter (Signed)
ERROR

## 2016-09-27 NOTE — Telephone Encounter (Signed)
LMOM for patient to call [facility]

## 2016-10-02 ENCOUNTER — Telehealth (INDEPENDENT_AMBULATORY_CARE_PROVIDER_SITE_OTHER): Payer: Self-pay | Admitting: Orthopaedic Surgery

## 2016-10-02 ENCOUNTER — Other Ambulatory Visit (INDEPENDENT_AMBULATORY_CARE_PROVIDER_SITE_OTHER): Payer: Self-pay

## 2016-10-02 DIAGNOSIS — M5442 Lumbago with sciatica, left side: Principal | ICD-10-CM

## 2016-10-02 DIAGNOSIS — M25512 Pain in left shoulder: Secondary | ICD-10-CM

## 2016-10-02 DIAGNOSIS — G8929 Other chronic pain: Secondary | ICD-10-CM

## 2016-10-02 DIAGNOSIS — M25511 Pain in right shoulder: Secondary | ICD-10-CM

## 2016-10-02 DIAGNOSIS — M542 Cervicalgia: Secondary | ICD-10-CM

## 2016-10-02 NOTE — Telephone Encounter (Signed)
Please help me!

## 2016-10-02 NOTE — Telephone Encounter (Signed)
Order in her chart, thank you!

## 2016-10-02 NOTE — Telephone Encounter (Signed)
Left message for pt to call me back directly

## 2016-10-02 NOTE — Telephone Encounter (Signed)
Is this something you can help with? I called [facility] PT and was told pt needed to call and set up her own appointment. She would be set up as self pay and would then need to submit the bills to her employer for payment. I called and spoke to the patient a few weeks ago to let her know and thought this was clear but somehow she has not been to PT. Her employer is not filing WC they are paying for her appointments. Thank you!

## 2016-10-02 NOTE — Telephone Encounter (Signed)
Patient states she was told by Jearl Klinefelter, that she could make her own appointment for PT @ [facility] and be self pay (see previous message) and when she called them today, she was told that she needed a referral from Dr Ninfa Linden along with W/C. Patient very upset & feels like she's not getting anywhere with the office in getting her PT sch'd.

## 2016-10-03 NOTE — Telephone Encounter (Signed)
I faxed the order to [facility] yesterday and s/w pt and advised her to call them to set up appt. Received a call from today from [place] w/[employer] (pts employer) wanting to set up PT appt and payment arrangements for her. Gave her PT # and advised her she would have to call them for scheduling.

## 2016-10-04 ENCOUNTER — Other Ambulatory Visit: Payer: Self-pay | Admitting: Cardiology

## 2016-10-04 DIAGNOSIS — I739 Peripheral vascular disease, unspecified: Secondary | ICD-10-CM

## 2016-10-08 ENCOUNTER — Ambulatory Visit (HOSPITAL_COMMUNITY)
Admission: RE | Admit: 2016-10-08 | Discharge: 2016-10-08 | Disposition: A | Discharge status: Home / Self Care | Payer: Medicare Other | Source: Ambulatory Visit | Attending: Internal Medicine | Admitting: Internal Medicine

## 2016-10-08 DIAGNOSIS — Z8673 Personal history of transient ischemic attack (TIA), and cerebral infarction without residual deficits: Secondary | ICD-10-CM | POA: Diagnosis not present

## 2016-10-08 DIAGNOSIS — E1151 Type 2 diabetes mellitus with diabetic peripheral angiopathy without gangrene: Secondary | ICD-10-CM | POA: Insufficient documentation

## 2016-10-08 DIAGNOSIS — I739 Peripheral vascular disease, unspecified: Secondary | ICD-10-CM

## 2016-10-16 ENCOUNTER — Other Ambulatory Visit (HOSPITAL_COMMUNITY): Payer: Self-pay

## 2016-10-17 ENCOUNTER — Ambulatory Visit (INDEPENDENT_AMBULATORY_CARE_PROVIDER_SITE_OTHER): Payer: Self-pay | Admitting: Physician Assistant

## 2016-10-18 ENCOUNTER — Ambulatory Visit: Payer: Worker's Compensation

## 2016-10-24 ENCOUNTER — Encounter: Payer: Self-pay | Admitting: Gynecology

## 2016-10-25 ENCOUNTER — Encounter: Payer: Self-pay | Admitting: *Deleted

## 2016-10-25 ENCOUNTER — Ambulatory Visit: Payer: Worker's Compensation | Attending: Family Medicine | Admitting: Physical Therapy

## 2016-10-25 ENCOUNTER — Encounter: Payer: Self-pay | Admitting: Physical Therapy

## 2016-10-25 DIAGNOSIS — M542 Cervicalgia: Secondary | ICD-10-CM | POA: Diagnosis present

## 2016-10-25 DIAGNOSIS — M545 Low back pain, unspecified: Secondary | ICD-10-CM

## 2016-10-25 DIAGNOSIS — M25511 Pain in right shoulder: Secondary | ICD-10-CM | POA: Diagnosis present

## 2016-10-25 DIAGNOSIS — G8929 Other chronic pain: Secondary | ICD-10-CM | POA: Diagnosis present

## 2016-10-25 NOTE — Therapy (Signed)
Alyssa Montgomery, [place], [address] Phone: [phone]   Fax:  [phone]  Physical Therapy Treatment  Patient Details  Name: Alyssa Montgomery MRN: [id number] Date of Birth: 1961-03-27 Referring Provider: Mcarthur Rossetti, MD  Encounter Date: 10/25/2016      PT End of Session - 10/25/16 1413    Visit Number 1   Number of Visits 17   Date for PT Re-Evaluation 12/21/16   Authorization Type Workers Comp  Sloan Leiter [phone]  fax [phone]   PT Start Time 1330   PT Stop Time 1413   PT Time Calculation (min) 43 min   Activity Tolerance Patient tolerated treatment well   Behavior During Therapy [facility] for tasks assessed/performed      Past Medical History:  Diagnosis Date  . Anxiety   . Bronchitis   . Diverticulitis   . Fibromyalgia   . GERD (gastroesophageal reflux disease)   . Headache   . Substance abuse    10 years ago ( cocaine)    Past Surgical History:  Procedure Laterality Date  . ANKLE SURGERY    . BACK SURGERY    . Bowel obstruction    . CERVICAL CONIZATION W/BX N/A 12/27/2015   Procedure: CONIZATION CERVIX WITH BIOPSY;  Surgeon: Terrance Mass, MD;  Location: [facility];  Service: Gynecology;  Laterality: N/A;  . DILATATION & CURETTAGE/HYSTEROSCOPY WITH MYOSURE N/A 12/27/2015   Procedure: DILATATION & CURETTAGE/HYSTEROSCOPY WITH MYOSURE;  Surgeon: Terrance Mass, MD;  Location: [facility];  Service: Gynecology;  Laterality: N/A;  . ECTOPIC PREGNANCY SURGERY    . EYE SURGERY     removed right eye;     There were no vitals filed for this visit.      Subjective Assessment - 10/25/16 1333    Subjective Alyssa Montgomery was trying to dress her patient on 06/18/16 when she fell on top of her. Alyssa Montgomery was laying face down and had to get out from under her patient. Now feeling more pain in R shoulder than L. Neck feels stiff and low back is painful. Reports taking 10 Tylenols a day and using OTC  creams and voltaren. Pt reports she is being forced to go to work so she is unable to take her meds properly. Prior to the accident, she had some aches and pains but was not limited.    Patient Stated Goals work, housework, dogs   Currently in Pain? Yes   Pain Score 8    Pain Location Back   Pain Orientation Lower;Left   Pain Descriptors / Indicators Stabbing   Pain Onset More than a month ago   Pain Frequency Constant   Aggravating Factors  driving   Multiple Pain Sites Yes   Pain Location Neck   Pain Orientation Mid;Right;Left  R worse than L   Pain Descriptors / Indicators Tightness   Pain Score 9  L5/10   Pain Location Shoulder   Pain Orientation Right;Left   Aggravating Factors  reaching, using arm            [facility] PT Assessment - 10/25/16 0001      Assessment   Medical Diagnosis cervicalgia, LBP, bilateral shoulder pain   Referring Provider Mcarthur Rossetti, MD   Onset Date/Surgical Date 06/18/16   Hand Dominance Right   Next MD Visit 5/21   Prior Therapy no     Precautions   Precaution Comments prosthetic R eye     Restrictions  Weight Bearing Restrictions No     Balance Screen   Has the patient fallen in the past 6 months Yes   How many times? 1   Has the patient had a decrease in activity level because of a fear of falling?  Yes   Is the patient reluctant to leave their home because of a fear of falling?  No     Home Ecologist residence   Living Arrangements Spouse/significant other   Additional Comments stairs at home     Prior Function   Level of Independence Independent   Vocation Full time employment   Actor, med tech     Cognition   Overall Cognitive Status Within Functional Limits for tasks assessed     Observation/Other Assessments   Focus on Therapeutic Outcomes (FOTO)  45% ability (goal 51%)     Sensation   Additional Comments Numbness in L foot     ROM / Strength   AROM /  PROM / Strength AROM;Strength     AROM   Overall AROM Comments cervical and lumbar ROM WFL     Strength   Overall Strength Comments to be tested at next visit     Palpation   Palpation comment TTP L QL and glut max, superior fibers; cervical; R periscapular region                     [facility] Adult PT Treatment/Exercise - 10/25/16 0001      Exercises   Exercises Shoulder     Shoulder Exercises: Stretch   Wall Stretch - Flexion Limitations towel slide- option to do on table seated in chair     Manual Therapy   Manual therapy comments edu in use of theracane and tennis ball                PT Education - 10/25/16 1517    Education provided Yes   Education Details anatomy of condition, POC, HEP, exercise form/rationale, tigger points- peripheralization/centralization   Person(s) Educated Patient   Methods Explanation;Demonstration;Tactile cues;Verbal cues   Comprehension Verbalized understanding;Returned demonstration;Verbal cues required;Need further instruction;Tactile cues required          PT Short Term Goals - 10/25/16 1520      PT SHORT TERM GOAL #1   Title Pt will demo GHJ AROM within 5 deg of L arm by 6/8   Baseline L WFL, R AROM to just above shoulder height   Time 3   Period Weeks   Status New     PT SHORT TERM GOAL #2   Title Pt will verbalize complaince with regular stretching program for hips and low back to decrease tightness and pain as result of daily activities   Baseline will establish as appropraite   Time 3   Period Weeks   Status New           PT Long Term Goals - 10/25/16 1522      PT LONG TERM GOAL #1   Title Pt will verbalize average pain with daily and work activities <=4/10 in all body parts to decrease pain effects on daily activities by 7/13   Baseline see subjective reports   Time 8   Period Weeks   Status New     PT LONG TERM GOAL #2   Title Pt will be able to properly squat in order to peform work  activities and care for dogs at home   Baseline  unable due to pain at eval   Time 8   Period Weeks   Status New     PT LONG TERM GOAL #3   Title Pt will be able to utilize 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 cervical and lumbar ROM are [facility] but are painful. concordant foot and arm pain were found when palpating trigger points. Pt was educated on use of tennis ball and theracane for trigger point release. Pt reported significant decrease in pain. Overall GHJ ROM is just above shoulder height, time limitations did not allow for measures at initial evaluation. Pt will benefit from skilled PT in order to decrease myofasical pain and improve activation and strength of lumbopelvic and periscapular musculature to improve function and reach long term goals.    Rehab Potential Good   PT Frequency 2x / week   PT Duration 8 weeks   PT Treatment/Interventions ADLs/Self Care Home Management;Cryotherapy;Electrical Stimulation;Iontophoresis 4mg /ml Dexamethasone;Stair training;Gait training;Ultrasound;Traction;Moist Heat;Therapeutic activities;Therapeutic exercise;Balance training;Neuromuscular re-education;Functional mobility training;Patient/family education;Passive range of motion;Manual techniques;Dry needling;Taping   PT Next Visit Plan GHJ ROM measurements   PT Home Exercise Plan wall slide GHJ flexion, tennis ball trigger point release, stand equal on feet.    Consulted and Agree with Plan of Care Patient       Patient will benefit from skilled therapeutic intervention in order to improve the following deficits and impairments:  Decreased range of motion, Difficulty walking, Impaired UE functional use, Increased muscle spasms, Decreased activity tolerance, Pain, Impaired flexibility, Improper body mechanics, Decreased strength, Impaired sensation, Postural dysfunction  Visit Diagnosis: Chronic left-sided low back pain without sciatica - Plan: PT plan of care cert/re-cert  Cervicalgia - Plan: PT plan of care cert/re-cert  Chronic right shoulder pain - Plan: PT plan of care cert/re-cert     Problem List Patient Active Problem List   Diagnosis Date Noted  . 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 Forgetfulness 09/06/2016  . Hypocalcemia 09/06/2016  . Claudication ([place]) 09/03/2016  . Palpitations 09/03/2016  . Other chest pain 08/10/2016  . Sinus infection 08/10/2016  . TIA (transient ischemic attack) 08/10/2016  . Weakness generalized 08/10/2016  . Near syncope 08/09/2016  . Bleeding from left ear 02/23/2016  . UTI symptoms 02/23/2016  . History of cervical dysplasia 01/18/2016  . Postoperative state 12/27/2015  . Diabetes ([place]) 04/17/2015  . Healthcare maintenance 04/17/2015  . Abscess 04/17/2015  . Tobacco use disorder 04/17/2015  . Right rotator cuff tendinitis 11/16/2014  . Neck pain on right side 11/16/2014  . Shoulder pain, right 11/03/2014  . Lower abdominal pain 08/05/2014  . Fibromyalgia 06/18/2014  . Preventative health care 01/21/2014  . Sciatic pain 10/15/2013  . Trigger point of neck 05/01/2013  . Goiter 02/28/2013  . Cervical stenosis (uterine cervix) 12/22/2012  . Wheeze 12/08/2012  . Hematuria 06/19/2012  . Back pain 06/19/2012  .  Tobacco abuse 01/18/2012  . Chronic female pelvic pain 04/05/2011  . FIBROIDS, UTERUS 03/09/2010  . DEPRESSION 12/26/2006  . GERD 12/26/2006   Katira Dumais C. Kahlee Metivier PT, DPT 10/25/16 3:28 PM   [facility] [address] [place], [place], [address] Phone: [phone]   Fax:  [phone]  Name: FRANCOISE CHOJNOWSKI MRN: [id number] Date of Birth: 05-16-1961

## 2016-10-26 ENCOUNTER — Telehealth (INDEPENDENT_AMBULATORY_CARE_PROVIDER_SITE_OTHER): Payer: Self-pay

## 2016-10-26 NOTE — Telephone Encounter (Signed)
Received call stating WC has now been accepted for this patient and Fanny Skates is the adj. Adv her to fax [place] signed release from pt and I will forward her the notes.

## 2016-10-29 ENCOUNTER — Ambulatory Visit (INDEPENDENT_AMBULATORY_CARE_PROVIDER_SITE_OTHER): Payer: Self-pay | Admitting: Physician Assistant

## 2016-10-30 ENCOUNTER — Telehealth (INDEPENDENT_AMBULATORY_CARE_PROVIDER_SITE_OTHER): Payer: Self-pay

## 2016-10-30 NOTE — Telephone Encounter (Signed)
Received fax from adj w/[facility] requesting office notes. Faxed the 07/18/16 office note.

## 2016-11-07 ENCOUNTER — Ambulatory Visit: Payer: Worker's Compensation | Admitting: Physical Therapy

## 2016-11-11 ENCOUNTER — Ambulatory Visit (HOSPITAL_COMMUNITY)
Admission: EM | Admit: 2016-11-11 | Discharge: 2016-11-11 | Disposition: A | Discharge status: Home / Self Care | Payer: Medicare Other | Attending: Internal Medicine | Admitting: Internal Medicine

## 2016-11-11 ENCOUNTER — Encounter (HOSPITAL_COMMUNITY): Payer: Self-pay | Admitting: Emergency Medicine

## 2016-11-11 DIAGNOSIS — J40 Bronchitis, not specified as acute or chronic: Secondary | ICD-10-CM

## 2016-11-11 DIAGNOSIS — R05 Cough: Secondary | ICD-10-CM | POA: Diagnosis not present

## 2016-11-11 DIAGNOSIS — R059 Cough, unspecified: Secondary | ICD-10-CM

## 2016-11-11 MED ORDER — IPRATROPIUM-ALBUTEROL 0.5-2.5 (3) MG/3ML IN SOLN
3.0000 mL | Freq: Once | RESPIRATORY_TRACT | Status: AC
  Administered 2016-11-11: 3 mL via RESPIRATORY_TRACT

## 2016-11-11 MED ORDER — AZITHROMYCIN 250 MG PO TABS: 250.0000 mg | ORAL_TABLET | Freq: Every day | ORAL | 0 refills | Status: DC

## 2016-11-11 MED ORDER — BENZONATATE 100 MG PO CAPS: 200.0000 mg | ORAL_CAPSULE | Freq: Three times a day (TID) | ORAL | 0 refills | Status: DC | PRN

## 2016-11-11 MED ORDER — IPRATROPIUM-ALBUTEROL 0.5-2.5 (3) MG/3ML IN SOLN
RESPIRATORY_TRACT | Status: AC
  Filled 2016-11-11: qty 3

## 2016-11-11 MED ORDER — METHYLPREDNISOLONE 4 MG PO TBPK: ORAL_TABLET | ORAL | 0 refills | Status: DC

## 2016-11-11 NOTE — ED Triage Notes (Signed)
Chest congestion and cough started 1 1/2 weeks ago.  Other home members have had the same.    Reports intermittent abdominal pain and diarrhea

## 2016-11-11 NOTE — ED Notes (Signed)
At the assessment room, patient kept coming up with issues from head to toe

## 2016-11-11 NOTE — ED Provider Notes (Signed)
CSN: [id number]     Arrival date & time 11/11/16  1309 History   None    Chief Complaint  Patient presents with  . URI   (Consider location/radiation/quality/duration/timing/severity/associated sxs/prior Treatment) Patient c/o cough and congestion for over 1 and half weeks ago.     The history is provided by the patient.  URI  Presenting symptoms: congestion, cough and fatigue   Severity:  Moderate Onset quality:  Sudden Duration:  2 weeks Timing:  Constant Progression:  Worsening Chronicity:  New Worsened by:  Nothing Ineffective treatments:  None tried   Past Medical History:  Diagnosis Date  . Anxiety   . Bronchitis   . Diverticulitis   . Fibromyalgia   . GERD (gastroesophageal reflux disease)   . Headache   . Substance abuse    10 years ago ( cocaine)   Past Surgical History:  Procedure Laterality Date  . ANKLE SURGERY    . BACK SURGERY    . Bowel obstruction    . CERVICAL CONIZATION W/BX N/A 12/27/2015   Procedure: CONIZATION CERVIX WITH BIOPSY;  Surgeon: Terrance Mass, MD;  Location: [facility];  Service: Gynecology;  Laterality: N/A;  . DILATATION & CURETTAGE/HYSTEROSCOPY WITH MYOSURE N/A 12/27/2015   Procedure: DILATATION & CURETTAGE/HYSTEROSCOPY WITH MYOSURE;  Surgeon: Terrance Mass, MD;  Location: [facility];  Service: Gynecology;  Laterality: N/A;  . ECTOPIC PREGNANCY SURGERY    . EYE SURGERY     removed right eye;    Family History  Problem Relation Age of Onset  . Cancer Father        lung  . Hypertension Mother   . Arthritis Mother   . Other Neg Hx        pituitary problem   Social History  Substance Use Topics  . Smoking status: Current Every Day Smoker    Packs/day: 0.50    Years: 18.00    Types: Cigarettes  . Smokeless tobacco: Never Used     Comment: 1-800 number given   . Alcohol use 0.6 oz/week    1 Glasses of wine per week     Comment: OCCASIONAL   OB History    Gravida Para Term Preterm AB Living   6 2 2   4 2    SAB TAB Ectopic  Multiple Live Births   2 1 1          Review of Systems  Constitutional: Positive for fatigue.  HENT: Positive for congestion.   Eyes: Negative.   Respiratory: Positive for cough.   Cardiovascular: Negative.   Gastrointestinal: Negative.   Endocrine: Negative.   Genitourinary: Negative.   Musculoskeletal: Negative.   Allergic/Immunologic: Negative.   Neurological: Negative.   Hematological: Negative.   Psychiatric/Behavioral: Negative.     Allergies  Zanaflex [tizanidine hcl]; Penicillins; and Sulfonamide derivatives  Home Medications   Prior to Admission medications   Medication Sig Start Date End Date Taking? Authorizing Provider  acetaminophen (TYLENOL) 325 MG tablet Take 650-975 mg by mouth 2 (two) times daily as needed for mild pain.    [provider]  albuterol (PROVENTIL HFA;VENTOLIN HFA) 108 (90 BASE) MCG/ACT inhaler Inhale 2 puffs into the lungs every 6 (six) hours as needed for wheezing. 04/15/15   Lorna Few, DO  Artificial Tear Solution (SOOTHE HYDRATION OP) Place 1 Dose into the left eye 2 (two) times daily. Reported on 12/26/2015    [provider]  azithromycin (ZITHROMAX) 250 MG tablet Take 1 tablet (250 mg total) by  mouth daily. Take first 2 tablets together, then 1 every day until finished. 11/11/16   Lysbeth Penner, FNP  benzonatate (TESSALON) 100 MG capsule Take 2 capsules (200 mg total) by mouth 3 (three) times daily as needed for cough. 11/11/16   Lysbeth Penner, FNP  bismuth subsalicylate (PEPTO BISMOL) 262 MG/15ML suspension Take 30 mLs by mouth every 6 (six) hours as needed for indigestion.    [provider]  Ca Carbonate-Mag Hydroxide (ROLAIDS PO) Take 1-2 tablets by mouth 4 (four) times daily as needed (For indigestion.). Reported on 12/26/2015    [provider]  Cholecalciferol (D3-1000) 1000 units tablet Take 1,000 Units by mouth daily. Reported on 12/26/2015    [provider]  Coenzyme Q10 (COQ-10)  200 MG CAPS Take 1 capsule by mouth daily. Reported on 12/26/2015    [provider]  diclofenac sodium (VOLTAREN) 1 % GEL Apply 2 g topically daily as needed (For muscle pain.). Reported on 12/26/2015 09/10/16   Lorna Few, DO  Menthol-Methyl Salicylate (MUSCLE RUB) 10-15 % CREA Apply 1 application topically as needed for muscle pain. Reported on 12/26/2015    [provider]  methylPREDNISolone (MEDROL DOSEPAK) 4 MG TBPK tablet Take 6-5-4-3-2-1 po qd 11/11/16   Lysbeth Penner, FNP  Multiple Vitamin (MULTIVITAMIN WITH MINERALS) TABS tablet Take 1 tablet by mouth daily. Reported on 12/26/2015    [provider]  ofloxacin (OCUFLOX) 0.3 % ophthalmic solution Place 2 drops into the left eye daily. Reported on 12/26/2015    [provider]  promethazine (PHENERGAN) 25 MG tablet Take 25 mg by mouth every 6 (six) hours as needed for nausea or vomiting. Reported on 12/26/2015    [provider]  simethicone (MYLICON) 962 MG chewable tablet Chew 125 mg by mouth every 6 (six) hours as needed for flatulence. Reported on 12/26/2015    [provider]   Meds Ordered and Administered this Visit   Medications  ipratropium-albuterol (DUONEB) 0.5-2.5 (3) MG/3ML nebulizer solution 3 mL (3 mLs Nebulization Given 11/11/16 1446)    BP 131/73 (BP Location: Right Arm)   Pulse 68   Temp 98.4 F (36.9 C) (Oral)   Resp 18   SpO2 100%  No data found.   Physical Exam  Constitutional: She appears well-developed and well-nourished.  HENT:  Head: Normocephalic and atraumatic.  Right Ear: External ear normal.  Left Ear: External ear normal.  Mouth/Throat: Oropharynx is clear and moist.  Eyes: Conjunctivae and EOM are normal. Pupils are equal, round, and reactive to light.  Neck: Normal range of motion. Neck supple.  Cardiovascular: Normal rate, regular rhythm and normal heart sounds.   Pulmonary/Chest: Effort normal and breath sounds normal.  Abdominal: Soft.  Bowel sounds are normal.  Nursing note and vitals reviewed.   Urgent Care Course     Procedures (including critical care time)  Labs Review Labs Reviewed - No data to display  Imaging Review No results found.   Visual Acuity Review  Right Eye Distance:   Left Eye Distance:   Bilateral Distance:    Right Eye Near:   Left Eye Near:    Bilateral Near:         MDM   1. Bronchitis   2. Cough    zpak Tessalon perles Medrol dose pack as directed Duo neb here in clinic Continue using albuterol MDI   Lysbeth Penner, FNP 11/11/16 [place], [place], FNP 11/19/16 1158

## 2016-11-13 ENCOUNTER — Other Ambulatory Visit: Payer: Medicare Other

## 2016-11-13 ENCOUNTER — Ambulatory Visit (INDEPENDENT_AMBULATORY_CARE_PROVIDER_SITE_OTHER): Payer: Medicare Other | Admitting: Neurology

## 2016-11-13 ENCOUNTER — Encounter: Payer: Self-pay | Admitting: Neurology

## 2016-11-13 VITALS — BP 122/84 | HR 68 | Ht 64.0 in | Wt 224.0 lb

## 2016-11-13 DIAGNOSIS — R413 Other amnesia: Secondary | ICD-10-CM

## 2016-11-13 DIAGNOSIS — F419 Anxiety disorder, unspecified: Secondary | ICD-10-CM | POA: Insufficient documentation
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
## 2016-11-13 NOTE — Patient Instructions (Signed)
1. Bloodwork for B12 level 2. Refer to Behavioral Medicine for psychiatry and therapy 3. Control of blood pressure, cholesterol, as well as physical exercise and brain stimulation exercises (crossword puzzles, reading, etc) are important for brain health 4. Follow-up in 6 months, call for any changes

## 2016-11-14 ENCOUNTER — Telehealth: Payer: Self-pay

## 2016-11-14 LAB — VITAMIN B12: Vitamin B-12: 580 pg/mL (ref 200–1100)

## 2016-11-14 NOTE — Telephone Encounter (Signed)
Spoke with pt relaying message below.  Pt appreciative.  

## 2016-11-14 NOTE — Telephone Encounter (Signed)
-----   Message from Cameron Sprang, MD sent at 11/14/2016 12:53 PM EDT ----- Pls let her know B12 level is normal, thanks

## 2016-11-15 ENCOUNTER — Encounter: Payer: Self-pay | Admitting: Physical Therapy

## 2016-11-15 ENCOUNTER — Telehealth: Payer: Self-pay | Admitting: Neurology

## 2016-11-15 ENCOUNTER — Ambulatory Visit: Payer: Worker's Compensation | Attending: Family Medicine | Admitting: Physical Therapy

## 2016-11-15 DIAGNOSIS — M542 Cervicalgia: Secondary | ICD-10-CM | POA: Diagnosis present

## 2016-11-15 DIAGNOSIS — M25511 Pain in right shoulder: Secondary | ICD-10-CM | POA: Diagnosis present

## 2016-11-15 DIAGNOSIS — G8929 Other chronic pain: Secondary | ICD-10-CM | POA: Diagnosis present

## 2016-11-15 DIAGNOSIS — M545 Low back pain, unspecified: Secondary | ICD-10-CM

## 2016-11-15 NOTE — Telephone Encounter (Signed)
Caller: Dianca   Urgent? No  Reason for the call: Right Shoulder and Lower back pain as well as numbness in her Left foot.  She said she was seen for her memory and didn't remember to mention that. Please Advise. Not sure if the referral mentioned that.  Thanks  Error

## 2016-11-15 NOTE — Therapy (Signed)
[place], [place], [address] Phone: [phone]   Fax:  [phone]  Physical Therapy Treatment  Patient Details  Name: Alyssa Montgomery MRN: [id number] Date of Birth: 29-Dec-1960 Referring Provider: Mcarthur Rossetti, MD  Encounter Date: 11/15/2016      PT End of Session - 11/15/16 1700    Visit Number 2   Number of Visits 17   Date for PT Re-Evaluation 12/21/16   PT Start Time 7793   PT Stop Time 1430   PT Time Calculation (min) 53 min   Activity Tolerance Patient tolerated treatment well   Behavior During Therapy [facility] for tasks assessed/performed      Past Medical History:  Diagnosis Date  . Anxiety   . Bronchitis   . Diverticulitis   . Fibromyalgia   . GERD (gastroesophageal reflux disease)   . Headache   . Substance abuse    10 years ago ( cocaine)    Past Surgical History:  Procedure Laterality Date  . ANKLE SURGERY    . BACK SURGERY    . Bowel obstruction    . CERVICAL CONIZATION W/BX N/A 12/27/2015   Procedure: CONIZATION CERVIX WITH BIOPSY;  Surgeon: Terrance Mass, MD;  Location: [facility];  Service: Gynecology;  Laterality: N/A;  . DILATATION & CURETTAGE/HYSTEROSCOPY WITH MYOSURE N/A 12/27/2015   Procedure: DILATATION & CURETTAGE/HYSTEROSCOPY WITH MYOSURE;  Surgeon: Terrance Mass, MD;  Location: [facility];  Service: Gynecology;  Laterality: N/A;  . ECTOPIC PREGNANCY SURGERY    . EYE SURGERY     removed right eye;     There were no vitals filed for this visit.      Subjective Assessment - 11/15/16 1338    Subjective Shew has been sick for the last 2 weeks.    Shoulder 10/10 anterior,     Currently in Pain? Yes   Pain Score 7   7.5/10   Pain Location Back   Pain Orientation Left;Lower   Pain Descriptors / Indicators --  pain that is just there   Pain Type Chronic pain   Pain Frequency Constant   Aggravating Factors  coughing   Pain Relieving Factors tennis ball    Multiple Pain Sites Yes   Pain Score 5   Pain Location Neck   Pain Orientation Right;Left   Pain Descriptors / Indicators Tightness   Pain Score 10   Pain Location Shoulder   Pain Orientation Right;Left   Pain Descriptors / Indicators Aching;Shooting   Pain Radiating Towards neck and deltiod   Aggravating Factors  coughing            [facility] PT Assessment - 11/15/16 0001      AROM   Overall AROM Comments flexion 92 AROM. Abduction.  PROM FULL    70 ABD,  ER  47 in neutral  right                     [facility] Adult PT Treatment/Exercise - 11/15/16 0001      Self-Care   Self-Care Posture  use of heat and ice,  heat should not make you ach   Posture rolled towels under arms and along spine,  cued for head position     Shoulder Exercises: Seated   Retraction 5 reps   2 sets     Moist Heat Therapy   Number Minutes Moist Heat 15 Minutes   Moist Heat Location Lumbar Spine;Shoulder  right shoulder  Cryotherapy   Number Minutes Cryotherapy 15 Minutes   Cryotherapy Location Shoulder  left   Type of Cryotherapy --  cold pack     Electrical Stimulation   Electrical Stimulation Location shoulder right   Electrical Stimulation Action IFC   Electrical Stimulation Parameters to tolerance   Electrical Stimulation Goals Pain     Manual Therapy   Manual Therapy Soft tissue mobilization   Manual therapy comments less pain post manual and stretching   Soft tissue mobilization right shoulder, right pecs, upper trap followed by gentle stretch pecs right,                PT Education - 11/15/16 1659    Education provided Yes   Education Details posture   Person(s) Educated Patient   Methods Explanation   Comprehension Verbalized understanding          PT Short Term Goals - 10/25/16 1520      PT SHORT TERM GOAL #1   Title Pt will demo GHJ AROM within 5 deg of L arm by 6/8   Baseline L WFL, R AROM to just above shoulder height   Time 3   Period Weeks    Status New     PT SHORT TERM GOAL #2   Title Pt will verbalize complaince with regular stretching program for hips and low back to decrease tightness and pain as result of daily activities   Baseline will establish as appropraite   Time 3   Period Weeks   Status New           PT Long Term Goals - 10/25/16 1522      PT LONG TERM GOAL #1   Title Pt will verbalize average pain with daily and work activities <=4/10 in all body parts to decrease pain effects on daily activities by 7/13   Baseline see subjective reports   Time 8   Period Weeks   Status New     PT LONG TERM GOAL #2   Title Pt will be able to properly squat in order to peform work activities and care for dogs at home   Baseline unable due to pain at eval   Time 8   Period Weeks   Status New     PT LONG TERM GOAL #3   Title Pt will be able to 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  she work a Engineer, production during session.  Pain greatly improved with session today.  See flow sheet for AROM right shoulder.  She may be ready for more exercise next session if we can keep her pain down.   PT Next Visit Plan posture exercises. modalities and manual as needed, ADL handout.    PT Home Exercise Plan wall slide GHJ flexion, tennis ball trigger point release, stand equal on feet.    Consulted and Agree with Plan of Care  Patient      Patient will benefit from skilled therapeutic intervention in order to improve the following deficits and impairments:  Decreased range of motion, Difficulty walking,  Impaired UE functional use, Increased muscle spasms, Decreased activity tolerance, Pain, Impaired flexibility, Improper body mechanics, Decreased strength, Impaired sensation, Postural dysfunction  Visit Diagnosis: Chronic left-sided low back pain without sciatica  Cervicalgia  Chronic right shoulder pain     Problem List Patient Active Problem List   Diagnosis Date Noted  . Anxiety 11/13/2016  . 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 Forgetfulness 09/06/2016  . Hypocalcemia 09/06/2016  . Claudication ([place]) 09/03/2016  . Palpitations 09/03/2016  . Other chest pain 08/10/2016  . Sinus infection 08/10/2016  . TIA (transient ischemic attack) 08/10/2016  . Weakness generalized 08/10/2016  . Near syncope 08/09/2016  . Bleeding from left ear 02/23/2016  . UTI symptoms 02/23/2016  . History of cervical dysplasia 01/18/2016  . Postoperative state 12/27/2015  . Diabetes ([place]) 04/17/2015  . Healthcare maintenance 04/17/2015  . Abscess 04/17/2015  . Tobacco use disorder 04/17/2015  . Right rotator cuff tendinitis 11/16/2014  . Neck pain on right side 11/16/2014  . Shoulder pain, right 11/03/2014  . Lower abdominal pain 08/05/2014  . Fibromyalgia 06/18/2014  . Preventative health care 01/21/2014  . Sciatic pain 10/15/2013  . Trigger point of neck 05/01/2013  . Goiter 02/28/2013  . Cervical stenosis (uterine cervix) 12/22/2012  . Wheeze 12/08/2012  . Hematuria 06/19/2012  . Back pain 06/19/2012  . Tobacco abuse 01/18/2012  . Chronic female pelvic pain 04/05/2011  . FIBROIDS, UTERUS 03/09/2010  . DEPRESSION 12/26/2006  . GERD 12/26/2006    Jinnifer Montejano PTA 11/15/2016, 5:06 PM  [facility] [address] [place], [place], [address] Phone: [phone]   Fax:  [phone]  Name: Alyssa Montgomery MRN: [id number] Date of Birth: 11/29/1960

## 2016-11-16 ENCOUNTER — Ambulatory Visit: Payer: Medicare Other | Admitting: Family Medicine

## 2016-11-16 ENCOUNTER — Encounter: Payer: Self-pay | Admitting: Cardiology

## 2016-11-20 ENCOUNTER — Ambulatory Visit: Payer: Worker's Compensation | Admitting: Physical Therapy

## 2016-11-21 ENCOUNTER — Encounter: Payer: Self-pay | Admitting: Gynecology

## 2016-11-21 ENCOUNTER — Ambulatory Visit (INDEPENDENT_AMBULATORY_CARE_PROVIDER_SITE_OTHER): Payer: Medicare Other | Admitting: Gynecology

## 2016-11-21 VITALS — BP 122/78 | Ht 64.0 in | Wt 224.2 lb

## 2016-11-21 DIAGNOSIS — Z01419 Encounter for gynecological examination (general) (routine) without abnormal findings: Secondary | ICD-10-CM

## 2016-11-21 DIAGNOSIS — R05 Cough: Secondary | ICD-10-CM

## 2016-11-21 DIAGNOSIS — Z113 Encounter for screening for infections with a predominantly sexual mode of transmission: Secondary | ICD-10-CM | POA: Diagnosis not present

## 2016-11-21 DIAGNOSIS — Z87410 Personal history of cervical dysplasia: Secondary | ICD-10-CM

## 2016-11-21 DIAGNOSIS — R059 Cough, unspecified: Secondary | ICD-10-CM

## 2016-11-21 NOTE — Progress Notes (Signed)
Alyssa Montgomery 10/11/1960 [id number]   History:    56 y.o.  for annual gyn exam who was complaining of a nonproductive cough over the past 3 weeks which she states she has gone to the emergency room visit is not recall having had a chest x-ray. Patient denies any exposure to any patient's with tuberculosis. 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  Pathology report from July 2017: Diagnosis 1. 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 Cervix, LEEP, button BENIGN CERVICAL SQUAMOUS MUCOSA AND ENDOCERVICAL GLANDS NO EVIDENCE OF INTRAEPITHELIAL DYSPLASIA MARGINS ARE NEGATIVE FOR DYSPLASIA Casimer Lanius MD   Patient is a chronic smoker have pack cigarette per day. In 2011 she had a colonoscopy benign polyps were removed. Patient's last bone density study was in 2012. Patient is on no hormone replacement therapy.  Past medical history,surgical history, family history and social history were all reviewed and documented in the EPIC chart.  Gynecologic History No LMP recorded. Patient is postmenopausal. Contraception: post menopausal status Last Pap: 2017. Results were: See above Last mammogram: 2016. Results were: normal  Obstetric History OB History  Gravida Para Term Preterm AB Living  6 2 2   4 2   SAB TAB Ectopic Multiple Live Births  2 1 1         # Outcome Date GA Lbr Len/2nd Weight Sex Delivery Anes PTL Lv  6 Term 03/24/89   8 lb 2 oz (3.685 kg) M Vag-Spont     5 Term 12/27/79   6 lb 10 oz (3.005 kg) F Vag-Spont     4 SAB           3 TAB           2 SAB           1 Ectopic                 ROS: A ROS was performed and pertinent positives and negatives are included in the history.  GENERAL: No fevers or chills. HEENT: No change in vision, no earache, sore throat or sinus congestion. NECK: No pain or stiffness. CARDIOVASCULAR: No chest pain or pressure. No palpitations. PULMONARY: Nonproductive cough GASTROINTESTINAL: No abdominal pain, nausea, vomiting or diarrhea, melena or bright red blood per rectum. GENITOURINARY: No urinary frequency, urgency, hesitancy or dysuria. MUSCULOSKELETAL: No joint or muscle pain, no back pain, no recent trauma. DERMATOLOGIC: No rash, no itching, no lesions. ENDOCRINE: No polyuria, polydipsia, no heat or cold intolerance. No recent change in weight. HEMATOLOGICAL: No anemia or easy bruising or bleeding. NEUROLOGIC: No headache, seizures, numbness, tingling or weakness. PSYCHIATRIC: No depression, no loss of interest in normal activity or change in sleep pattern.     Exam: chaperone present  BP 122/78   Ht 5\' 4"  (1.626 m)   Wt 224 lb 3.2 oz (101.7 kg)   BMI 38.48 kg/m   Body mass index is 38.48 kg/m.  General appearance : Well developed well nourished female. No acute distress HEENT: Eyes: no retinal hemorrhage or exudates,  Neck supple, trachea midline, no carotid bruits, no thyroidmegaly Lungs: Inspiratory rhonchi both lower lung fields Heart: Regular rate and rhythm, no murmurs or gallops Breast:Examined in  sitting and supine position were symmetrical in appearance, no palpable masses or tenderness,  no skin retraction, no nipple inversion, no nipple discharge, no skin discoloration, no axillary or supraclavicular lymphadenopathy Abdomen: no palpable masses or tenderness, no rebound or guarding Extremities: no edema or skin discoloration or tenderness  Pelvic:  Bartholin, Urethra, Skene Glands: Within normal limits             Vagina: No gross lesions or discharge  Cervix: No gross lesions or discharge  Uterus  anteverted, normal  size, shape and consistency, non-tender and mobile  Adnexa  Without masses or tenderness  Anus and perineum  normal   Rectovaginal  normal sphincter tone without palpated masses or tenderness             Hemoccult will need colonoscopy this year     Assessment/Plan:  56 y.o. female for annual exam this to have her colonoscopy this year as well as her bone density study and mammogram for which requisition was provided. Because of her past history of CIN-1 and CIN-2 and result [place] LEEP cervical conization a Pap smear with HPV screen was done today. Patient with inspiratory rhonchi and persistent cough for 3 weeks states that she's going to the emergency room when she leaves the office today and serial do a chest x-ray. Patient was requesting an STD screening so herpes panel along with HIV, RPR, hepatitis B and C along with GC and Chlamydia culture was obtained today.   Terrance Mass MD, 4:55 PM 11/21/2016

## 2016-11-21 NOTE — Patient Instructions (Signed)
Colonoscopy, Adult A colonoscopy is an exam to look at the entire large intestine. During the exam, a lubricated, bendable tube is inserted into the anus and then passed into the rectum, colon, and other parts of the large intestine. A colonoscopy is often done as a part of normal colorectal screening or in response to certain symptoms, such as anemia, persistent diarrhea, abdominal pain, and blood in the stool. The exam can help screen for and diagnose medical problems, including:  Tumors.  Polyps.  Inflammation.  Areas of bleeding.  Tell a health care provider about:  Any allergies you have.  All medicines you are taking, including vitamins, herbs, eye drops, creams, and over-the-counter medicines.  Any problems you or family members have had with anesthetic medicines.  Any blood disorders you have.  Any surgeries you have had.  Any medical conditions you have.  Any problems you have had passing stool. What are the risks? Generally, this is a safe procedure. However, problems may occur, including:  Bleeding.  A tear in the intestine.  A reaction to medicines given during the exam.  Infection (rare).  What happens before the procedure? Eating and drinking restrictions Follow instructions from your health care provider about eating and drinking, which may include:  A few days before the procedure - follow a low-fiber diet. Avoid nuts, seeds, dried fruit, raw fruits, and vegetables.  1-3 days before the procedure - follow a clear liquid diet. Drink only clear liquids, such as clear broth or bouillon, black coffee or tea, clear juice, clear soft drinks or sports drinks, gelatin dessert, and popsicles. Avoid any liquids that contain red or purple dye.  On the day of the procedure - do not eat or drink anything during the 2 hours before the procedure, or within the time period that your health care provider recommends.  Bowel prep If you were prescribed an oral bowel prep  to clean out your colon:  Take it as told by your health care provider. Starting the day before your procedure, you will need to drink a large amount of medicated liquid. The liquid will cause you to have multiple loose stools until your stool is almost clear or light green.  If your skin or anus gets irritated from diarrhea, you may use these to relieve the irritation: ? Medicated wipes, such as adult wet wipes with aloe and vitamin E. ? A skin soothing-product like petroleum jelly.  If you vomit while drinking the bowel prep, take a break for up to 60 minutes and then begin the bowel prep again. If vomiting continues and you cannot take the bowel prep without vomiting, call your health care provider.  General instructions  Ask your health care provider about changing or stopping your regular medicines. This is especially important if you are taking diabetes medicines or blood thinners.  Plan to have someone take you home from the hospital or clinic. What happens during the procedure?  An IV tube may be inserted into one of your veins.  You will be given medicine to help you relax (sedative).  To reduce your risk of infection: ? Your health care team will wash or sanitize their hands. ? Your anal area will be washed with soap.  You will be asked to lie on your side with your knees bent.  Your health care provider will lubricate a long, thin, flexible tube. The tube will have a camera and a light on the end.  The tube will be inserted into your   anus.  The tube will be gently eased through your rectum and colon.  Air will be delivered into your colon to keep it open. You may feel some pressure or cramping.  The camera will be used to take images during the procedure.  A small tissue sample may be removed from your body to be examined under a microscope (biopsy). If any potential problems are found, the tissue will be sent to a lab for testing.  If small polyps are found, your  health care provider may remove them and have them checked for cancer cells.  The tube that was inserted into your anus will be slowly removed. The procedure may vary among health care providers and hospitals. What happens after the procedure?  Your blood pressure, heart rate, breathing rate, and blood oxygen level will be monitored until the medicines you were given have worn off.  Do not drive for 24 hours after the exam.  You may have a small amount of blood in your stool.  You may pass gas and have mild abdominal cramping or bloating due to the air that was used to inflate your colon during the exam.  It is up to you to get the results of your procedure. Ask your health care provider, or the department performing the procedure, when your results will be ready. This information is not intended to replace advice given to you by your health care provider. Make sure you discuss any questions you have with your health care provider. Document Released: 05/25/2000 Document Revised: 03/28/2016 Document Reviewed: 08/09/2015 Elsevier Interactive Patient Education  2018 [employer]. Bone Densitometry Bone densitometry is an imaging test that uses a special X-ray to measure the amount of calcium and other minerals in your bones (bone density). This test is also known as a bone mineral density test or dual-energy X-ray absorptiometry (DXA). The test can measure bone density at your hip and your spine. It is similar to having a regular X-ray. You may have this test to:  Diagnose a condition that causes weak or thin bones (osteoporosis).  Predict your risk of a broken bone (fracture).  Determine how well osteoporosis treatment is working.  Tell a health care provider about:  Any allergies you have.  All medicines you are taking, including vitamins, herbs, eye drops, creams, and over-the-counter medicines.  Any problems you or family members have had with anesthetic medicines.  Any blood  disorders you have.  Any surgeries you have had.  Any medical conditions you have.  Possibility of pregnancy.  Any other medical test you had within the previous 14 days that used contrast material. What are the risks? Generally, this is a safe procedure. However, problems can occur and may include the following:  This test exposes you to a very small amount of radiation.  The risks of radiation exposure may be greater to unborn children.  What happens before the procedure?  Do not take any calcium supplements for 24 hours before having the test. You can otherwise eat and drink what you usually do.  Take off all metal jewelry, eyeglasses, dental appliances, and any other metal objects. What happens during the procedure?  You may lie on an exam table. There will be an X-ray generator below you and an imaging device above you.  Other devices, such as boxes or braces, may be used to position your body properly for the scan.  You will need to lie still while the machine slowly scans your body.  The images  will show up on a computer monitor. What happens after the procedure? You may need more testing at a later time. This information is not intended to replace advice given to you by your health care provider. Make sure you discuss any questions you have with your health care provider. Document Released: 06/19/2004 Document Revised: 11/03/2015 Document Reviewed: 11/05/2013 Elsevier Interactive Patient Education  2018 [employer].

## 2016-11-22 ENCOUNTER — Ambulatory Visit: Payer: Medicare Other | Attending: Family Medicine | Admitting: Physical Therapy

## 2016-11-22 ENCOUNTER — Encounter: Payer: Self-pay | Admitting: Physical Therapy

## 2016-11-22 DIAGNOSIS — M545 Low back pain, unspecified: Secondary | ICD-10-CM

## 2016-11-22 DIAGNOSIS — M25511 Pain in right shoulder: Secondary | ICD-10-CM | POA: Diagnosis not present

## 2016-11-22 DIAGNOSIS — G8929 Other chronic pain: Secondary | ICD-10-CM | POA: Diagnosis not present

## 2016-11-22 DIAGNOSIS — M542 Cervicalgia: Secondary | ICD-10-CM | POA: Insufficient documentation

## 2016-11-22 LAB — HEPATITIS C ANTIBODY: HCV Ab: NEGATIVE

## 2016-11-22 LAB — HEPATITIS B SURFACE ANTIGEN: Hepatitis B Surface Ag: NEGATIVE

## 2016-11-22 LAB — GC/CHLAMYDIA PROBE AMP
CT Probe RNA: NOT DETECTED
GC Probe RNA: NOT DETECTED

## 2016-11-22 LAB — HIV ANTIBODY (ROUTINE TESTING W REFLEX): HIV 1&2 Ab, 4th Generation: NONREACTIVE

## 2016-11-22 LAB — RPR

## 2016-11-22 NOTE — Therapy (Addendum)
[place], [place], [address] Phone: [phone]   Fax:  [phone]  Physical Therapy Treatment/Discharge summary  Patient Details  Name: Alyssa Montgomery MRN: [id number] Date of Birth: Apr 19, 1961 Referring Provider: Mcarthur Rossetti, MD  Encounter Date: 11/22/2016      PT End of Session - 11/22/16 1332    Visit Number 3   Number of Visits 17   Date for PT Re-Evaluation 12/21/16   Authorization Type Workers Comp  Alyssa Montgomery [phone]  fax [phone]   PT Start Time 1332   PT Stop Time 1414   PT Time Calculation (min) 42 min   Activity Tolerance Patient limited by pain   Behavior During Therapy Restless      Past Medical History:  Diagnosis Date  . Anxiety   . Bronchitis   . Diverticulitis   . Fibromyalgia   . GERD (gastroesophageal reflux disease)   . Headache   . Substance abuse    10 years ago ( cocaine)    Past Surgical History:  Procedure Laterality Date  . ANKLE SURGERY    . BACK SURGERY    . Bowel obstruction    . CERVICAL CONIZATION W/BX N/A 12/27/2015   Procedure: CONIZATION CERVIX WITH BIOPSY;  Surgeon: Terrance Mass, MD;  Location: [facility];  Service: Gynecology;  Laterality: N/A;  . DILATATION & CURETTAGE/HYSTEROSCOPY WITH MYOSURE N/A 12/27/2015   Procedure: DILATATION & CURETTAGE/HYSTEROSCOPY WITH MYOSURE;  Surgeon: Terrance Mass, MD;  Location: [facility];  Service: Gynecology;  Laterality: N/A;  . ECTOPIC PREGNANCY SURGERY    . EYE SURGERY     removed right eye;     There were no vitals filed for this visit.      Subjective Assessment - 11/22/16 1336    Subjective Reports R shoulder is very sore. Took muscle relaxers, tylenol and pain patch last night to sleep. Sore after using tennis ball yesterday. Shoulder feels stiff.    Currently in Pain? Yes   Pain Score 8    Pain Location Back   Pain Orientation Lower;Left   Pain Descriptors / Indicators Sharp    Pain Radiating Towards groin   Pain Score 6   Pain Location Neck   Pain Orientation Right   Pain Descriptors / Indicators Tightness   Pain Relieving Factors tennis ball trigger point release   Pain Score 8   Pain Location Shoulder   Pain Orientation Right   Pain Descriptors / Indicators Sore   Aggravating Factors  abduction            [facility] PT Assessment - 11/22/16 0001      ROM / Strength   AROM / PROM / Strength AROM     AROM   Overall AROM Comments PROM full   AROM Assessment Site Shoulder   Right/Left Shoulder Right   Right Shoulder Flexion 70 Degrees   Right Shoulder ABduction 85 Degrees   Right Shoulder Internal Rotation --  S1   Right Shoulder External Rotation --  unable to reach behind head                     [facility] Adult PT Treatment/Exercise - 11/22/16 0001      Exercises   Exercises Knee/Hip     Knee/Hip Exercises: Stretches   Other Knee/Hip Stretches figure 4     Shoulder Exercises: Sidelying   External Rotation Limitations AROM x10     Shoulder Exercises: Stretch  External Rotation Stretch 3 reps;30 seconds     Manual Therapy   Manual Therapy Joint mobilization   Joint Mobilization L FA grade 5 long axis   Soft tissue mobilization L piriformis, R Upper trap ischemic release, L illiopsoas release                PT Education - 11/22/16 1424    Education provided Yes   Education Details exercise form/rationale, anatomy of condition, cramping/spasm   Person(s) Educated Patient   Methods Explanation;Demonstration;Tactile cues;Verbal cues   Comprehension Verbalized understanding;Returned demonstration;Verbal cues required;Need further instruction;Tactile cues required          PT Short Term Goals - 11/22/16 1417      PT SHORT TERM GOAL #1   Title Pt will demo GHJ AROM within 5 deg of L arm by 6/8   Baseline see flowhseet   Status On-going     PT SHORT TERM GOAL #2   Title Pt will verbalize complaince with regular  stretching program for hips and low back to decrease tightness and pain as result of daily activities   Baseline uses tennis ball   Status On-going           PT Long Term Goals - 10/25/16 1522      PT LONG TERM GOAL #1   Title Pt will verbalize average pain with daily and work activities <=4/10 in all body parts to decrease pain effects on daily activities by 7/13   Baseline see subjective reports   Time 8   Period Weeks   Status New     PT LONG TERM GOAL #2   Title Pt will be able to properly squat in order to peform work activities and care for dogs at home   Baseline unable due to pain at eval   Time 8   Period Weeks   Status New     PT LONG TERM GOAL #3   Title 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 Significant tightness in pectoralis likely due to coughing from illness the last couple of weeks. Attempted to have pt perform AAROM but states she does not want to [facility] it the way she is instructed due to fear of pain. Attempted to educate pt on biomechanical chain activation of shoulder and while the pain she feels is in different places, the whole shoulder needs to be treated. Did not like doing sidelying external rotation due to neck pain but was unwilling to lay head down. Ischemic release of R upper trap made pt feel "100% better"  Pt also reported a decrease in hip pain after trigger point release in illiopsoas. Will try to incoorporate more exercises to encourage pt to move her arm and decrease fear of pain. Pt going back to urgent  care today to discuss continued coughing.    PT Treatment/Interventions ADLs/Self Care Home Management;Cryotherapy;Electrical Stimulation;Iontophoresis 39m/ml  Dexamethasone;Stair training;Gait training;Ultrasound;Traction;Moist Heat;Therapeutic activities;Therapeutic exercise;Balance training;Neuromuscular re-education;Functional mobility training;Patient/family education;Passive range of motion;Manual techniques;Dry needling;Taping   PT Next Visit Plan AAROM GHJ, isometrics   PT Home Exercise Plan wall slide GHJ flexion, tennis ball trigger point release, stand equal on feet.    Consulted and Agree with Plan of Care Patient      Patient will benefit from skilled therapeutic intervention in order to improve the following deficits and impairments:  Decreased range of motion, Difficulty walking, Impaired UE functional use, Increased muscle spasms, Decreased activity tolerance, Pain, Impaired flexibility, Improper body mechanics, Decreased strength, Impaired sensation, Postural dysfunction  Visit Diagnosis: Chronic left-sided low back pain without sciatica  Chronic right shoulder pain  Cervicalgia     Problem List Patient Active Problem List   Diagnosis Date Noted  . Anxiety 11/13/2016  . 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 Forgetfulness 09/06/2016  . Hypocalcemia 09/06/2016  . Claudication ([place]) 09/03/2016  . Palpitations 09/03/2016  . Other chest pain 08/10/2016  . Sinus infection 08/10/2016  . TIA (transient ischemic attack) 08/10/2016  . Weakness generalized 08/10/2016  . Near syncope 08/09/2016  . Bleeding from left ear 02/23/2016  . History of cervical dysplasia 01/18/2016  . Postoperative state 12/27/2015  . Diabetes ([place]) 04/17/2015  . Healthcare maintenance 04/17/2015  . Abscess 04/17/2015  . Tobacco use disorder 04/17/2015  . Right rotator cuff tendinitis 11/16/2014  . Neck pain on right side 11/16/2014  . Shoulder pain, right 11/03/2014  . Lower abdominal  pain 08/05/2014  . Fibromyalgia 06/18/2014  . Preventative health care 01/21/2014  . Sciatic pain 10/15/2013  . Trigger point of neck 05/01/2013  . Goiter 02/28/2013  . Wheeze 12/08/2012  . Hematuria 06/19/2012  . Back pain 06/19/2012  . Tobacco abuse 01/18/2012  . FIBROIDS, UTERUS 03/09/2010  . DEPRESSION 12/26/2006  . GERD 12/26/2006    Alyssa Montgomery C. Laisha Rau PT, DPT 11/22/16 2:26 PM   [facility] [address] [place], [place], [address] Phone: [phone]   Fax:  [phone]  Name: Alyssa Montgomery MRN: [id number] Date of Birth: 07-Aug-1960  PHYSICAL THERAPY DISCHARGE SUMMARY  Visits from Start of Care: 3  Current functional level related to goals / functional outcomes: See above   Remaining deficits: See above   Education / Equipment: Anatomy of condition, POC, HEP, exercise form/rationale  Plan: Patient agrees to discharge.  Patient goals were not met. Patient is being discharged due to                                                     ?????    According to [facility], pt is using another resource for PT and will not be approved more visits at this time.   Alyssa Montgomery C. Madalynn Pickelsimer PT, DPT 12/10/16 5:08 PM

## 2016-11-24 ENCOUNTER — Encounter (HOSPITAL_COMMUNITY): Payer: Self-pay

## 2016-11-24 ENCOUNTER — Emergency Department (HOSPITAL_COMMUNITY)
Admission: EM | Admit: 2016-11-24 | Discharge: 2016-11-24 | Disposition: A | Discharge status: Home / Self Care | Payer: Medicare Other | Attending: Emergency Medicine | Admitting: Emergency Medicine

## 2016-11-24 ENCOUNTER — Emergency Department (HOSPITAL_COMMUNITY): Payer: Medicare Other

## 2016-11-24 DIAGNOSIS — Z8673 Personal history of transient ischemic attack (TIA), and cerebral infarction without residual deficits: Secondary | ICD-10-CM | POA: Diagnosis not present

## 2016-11-24 DIAGNOSIS — E119 Type 2 diabetes mellitus without complications: Secondary | ICD-10-CM | POA: Diagnosis not present

## 2016-11-24 DIAGNOSIS — F17210 Nicotine dependence, cigarettes, uncomplicated: Secondary | ICD-10-CM | POA: Insufficient documentation

## 2016-11-24 DIAGNOSIS — Z79899 Other long term (current) drug therapy: Secondary | ICD-10-CM | POA: Diagnosis not present

## 2016-11-24 DIAGNOSIS — F419 Anxiety disorder, unspecified: Secondary | ICD-10-CM | POA: Insufficient documentation

## 2016-11-24 DIAGNOSIS — J40 Bronchitis, not specified as acute or chronic: Secondary | ICD-10-CM

## 2016-11-24 DIAGNOSIS — J209 Acute bronchitis, unspecified: Secondary | ICD-10-CM | POA: Insufficient documentation

## 2016-11-24 DIAGNOSIS — J069 Acute upper respiratory infection, unspecified: Secondary | ICD-10-CM | POA: Diagnosis not present

## 2016-11-24 DIAGNOSIS — R05 Cough: Secondary | ICD-10-CM | POA: Diagnosis present

## 2016-11-24 LAB — HSV TYPE I/II IGG, IGMW/ REFLEX
HSV 1 Glycoprotein G Ab, IgG: 40.7 Index — ABNORMAL HIGH (ref <0.90)
HSV 1 IgM Screen: NEGATIVE
HSV 2 Glycoprotein G Ab, IgG: 17.1 Index — ABNORMAL HIGH (ref <0.90)
HSV 2 IgM Screen: NEGATIVE

## 2016-11-24 MED ORDER — ALBUTEROL SULFATE (2.5 MG/3ML) 0.083% IN NEBU
2.5000 mg | INHALATION_SOLUTION | Freq: Once | RESPIRATORY_TRACT | Status: AC
  Administered 2016-11-24: 2.5 mg via RESPIRATORY_TRACT
  Filled 2016-11-24: qty 3

## 2016-11-24 MED ORDER — DEXAMETHASONE SODIUM PHOSPHATE 4 MG/ML IJ SOLN
8.0000 mg | Freq: Once | INTRAMUSCULAR | Status: AC
  Administered 2016-11-24: 8 mg via INTRAMUSCULAR
  Filled 2016-11-24: qty 2

## 2016-11-24 MED ORDER — DOXYCYCLINE HYCLATE 100 MG PO TABS: 100.0000 mg | ORAL_TABLET | Freq: Two times a day (BID) | ORAL | 0 refills | Status: DC

## 2016-11-24 MED ORDER — ALBUTEROL SULFATE HFA 108 (90 BASE) MCG/ACT IN AERS
2.0000 | INHALATION_SPRAY | Freq: Once | RESPIRATORY_TRACT | Status: AC
  Administered 2016-11-24: 2 via RESPIRATORY_TRACT
  Filled 2016-11-24: qty 6.7

## 2016-11-24 MED ORDER — HYDROCOD POLST-CPM POLST ER 10-8 MG/5ML PO SUER
5.0000 mL | Freq: Once | ORAL | Status: AC
  Administered 2016-11-24: 5 mL via ORAL
  Filled 2016-11-24: qty 5

## 2016-11-24 MED ORDER — DOXYCYCLINE HYCLATE 100 MG PO TABS
100.0000 mg | ORAL_TABLET | Freq: Once | ORAL | Status: AC
  Administered 2016-11-24: 100 mg via ORAL
  Filled 2016-11-24: qty 1

## 2016-11-24 MED ORDER — HYDROCODONE-HOMATROPINE 5-1.5 MG/5ML PO SYRP: 5.0000 mL | ORAL_SOLUTION | Freq: Four times a day (QID) | ORAL | 0 refills | Status: DC | PRN

## 2016-11-24 NOTE — ED Provider Notes (Signed)
[facility] DEPT Provider Note   CSN: [id number] Arrival date & time: 11/24/16  2038     History   Chief Complaint Chief Complaint  Patient presents with  . Cough  . Nasal Congestion    HPI Alyssa Montgomery is a 56 y.o. female.  Patient is a 56 year old female who presents to the emergency department with a complaint of cough and congestion. Patient states that she started out with congestion and then had problems with cough. She at times has problems with wheezing. She has been sick over the last 3 weeks. She states that 2 children in her family were sick and then the remaining members of the family also had upper respiratory symptoms. She complains that she has not been able to get rid of her symptoms. She has been evaluated by urgent care. She states that she has been seen by the emergency department physicians. She also has been seen by her primary care physician. She has been on steroid medication, as well as antibiotics. She states that she still feels bad. She has had some chills as well as some low-grade temperature with her temperature maximum being 99.5. She denied denies hemoptysis. She's not had any operations or procedures involving her chest. She's not been out of the country recently.      Past Medical History:  Diagnosis Date  . Anxiety   . Bronchitis   . Diverticulitis   . Fibromyalgia   . GERD (gastroesophageal reflux disease)   . Headache   . Substance abuse    10 years ago ( cocaine)    Patient Active Problem List   Diagnosis Date Noted  . Anxiety 11/13/2016  . 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 Forgetfulness 09/06/2016  . Hypocalcemia 09/06/2016  . Claudication ([place]) 09/03/2016  . Palpitations 09/03/2016  . Other chest pain 08/10/2016  . Sinus infection 08/10/2016  . TIA (transient ischemic attack) 08/10/2016  . Weakness generalized 08/10/2016  . Near syncope 08/09/2016  . Bleeding from left ear 02/23/2016  . History of cervical  dysplasia 01/18/2016  . Postoperative state 12/27/2015  . Diabetes ([place]) 04/17/2015  . Healthcare maintenance 04/17/2015  . Abscess 04/17/2015  . Tobacco use disorder 04/17/2015  . Right rotator cuff tendinitis 11/16/2014  . Neck pain on right side 11/16/2014  . Shoulder pain, right 11/03/2014  . Lower abdominal pain 08/05/2014  . Fibromyalgia 06/18/2014  . Preventative health care 01/21/2014  . Sciatic pain 10/15/2013  . Trigger point of neck 05/01/2013  . Goiter 02/28/2013  . Wheeze 12/08/2012  . Hematuria 06/19/2012  . Back pain 06/19/2012  . Tobacco abuse 01/18/2012  . FIBROIDS, UTERUS 03/09/2010  . DEPRESSION 12/26/2006  . GERD 12/26/2006    Past Surgical History:  Procedure Laterality Date  . ANKLE SURGERY    . BACK SURGERY    . Bowel obstruction    . CERVICAL CONIZATION W/BX N/A 12/27/2015   Procedure: CONIZATION CERVIX WITH BIOPSY;  Surgeon: Terrance Mass, MD;  Location: [facility];  Service: Gynecology;  Laterality: N/A;  . DILATATION & CURETTAGE/HYSTEROSCOPY WITH MYOSURE N/A 12/27/2015   Procedure: DILATATION & CURETTAGE/HYSTEROSCOPY WITH MYOSURE;  Surgeon: Terrance Mass, MD;  Location: [facility];  Service: Gynecology;  Laterality: N/A;  . ECTOPIC PREGNANCY SURGERY    . EYE SURGERY     removed right eye;     OB History    Gravida Para Term Preterm AB Living   6 2 2   4 2    SAB TAB Ectopic Multiple  Live Births   2 1 1            Home Medications    Prior to Admission medications   Medication Sig Start Date End Date Taking? Authorizing Provider  acetaminophen (TYLENOL) 325 MG tablet Take 650-975 mg by mouth 2 (two) times daily as needed for mild pain.    [provider]  albuterol (PROVENTIL HFA;VENTOLIN HFA) 108 (90 BASE) MCG/ACT inhaler Inhale 2 puffs into the lungs every 6 (six) hours as needed for wheezing. 04/15/15   Lorna Few, DO  Artificial Tear Solution (SOOTHE HYDRATION OP) Place 1 Dose into the left eye 2 (two) times daily. Reported  on 12/26/2015    [provider]  benzonatate (TESSALON) 100 MG capsule Take 2 capsules (200 mg total) by mouth 3 (three) times daily as needed for cough. 11/11/16   Lysbeth Penner, FNP  bismuth subsalicylate (PEPTO BISMOL) 262 MG/15ML suspension Take 30 mLs by mouth every 6 (six) hours as needed for indigestion.    [provider]  Ca Carbonate-Mag Hydroxide (ROLAIDS PO) Take 1-2 tablets by mouth 4 (four) times daily as needed (For indigestion.). Reported on 12/26/2015    [provider]  Cholecalciferol (D3-1000) 1000 units tablet Take 1,000 Units by mouth daily. Reported on 12/26/2015    [provider]  Coenzyme Q10 (COQ-10) 200 MG CAPS Take 1 capsule by mouth daily. Reported on 12/26/2015    [provider]  diclofenac sodium (VOLTAREN) 1 % GEL Apply 2 g topically daily as needed (For muscle pain.). Reported on 12/26/2015 Patient not taking: Reported on 11/21/2016 09/10/16   Lorna Few, DO  Menthol-Methyl Salicylate (MUSCLE RUB) 10-15 % CREA Apply 1 application topically as needed for muscle pain. Reported on 12/26/2015    [provider]  methylPREDNISolone (MEDROL DOSEPAK) 4 MG TBPK tablet Take 6-5-4-3-2-1 po qd 11/11/16   Lysbeth Penner, FNP  Multiple Vitamin (MULTIVITAMIN WITH MINERALS) TABS tablet Take 1 tablet by mouth daily. Reported on 12/26/2015    [provider]  ofloxacin (OCUFLOX) 0.3 % ophthalmic solution Place 2 drops into the left eye daily. Reported on 12/26/2015    [provider]  promethazine (PHENERGAN) 25 MG tablet Take 25 mg by mouth every 6 (six) hours as needed for nausea or vomiting. Reported on 12/26/2015    [provider]  simethicone (MYLICON) 878 MG chewable tablet Chew 125 mg by mouth every 6 (six) hours as needed for flatulence. Reported on 12/26/2015    [provider]    Family History Family History  Problem Relation Age of Onset  . Cancer Father        lung  .  Hypertension Mother   . Arthritis Mother   . Other Neg Hx        pituitary problem    Social History Social History  Substance Use Topics  . Smoking status: Current Every Day Smoker    Packs/day: 0.50    Years: 18.00    Types: Cigarettes  . Smokeless tobacco: Never Used     Comment: 1-800 number given   . Alcohol use 0.6 oz/week    1 Glasses of wine per week     Comment: OCCASIONAL     Allergies   Zanaflex [tizanidine hcl]; Penicillins; and Sulfonamide derivatives   Review of Systems Review of Systems  Constitutional: Positive for chills and fatigue. Negative for activity change and appetite change.  HENT: Positive for congestion. Negative for ear discharge, ear pain,  facial swelling, nosebleeds, rhinorrhea, sneezing and tinnitus.   Eyes: Negative for photophobia, pain and discharge.  Respiratory: Positive for cough and wheezing. Negative for choking and shortness of breath.   Cardiovascular: Negative for chest pain, palpitations and leg swelling.  Gastrointestinal: Negative for abdominal pain, blood in stool, constipation, diarrhea, nausea and vomiting.  Genitourinary: Negative for difficulty urinating, dysuria, flank pain, frequency and hematuria.  Musculoskeletal: Positive for myalgias. Negative for back pain, gait problem and neck pain.  Skin: Negative for color change, rash and wound.  Neurological: Negative for dizziness, seizures, syncope, facial asymmetry, speech difficulty, weakness and numbness.  Hematological: Negative for adenopathy. Does not bruise/bleed easily.  Psychiatric/Behavioral: Negative for agitation, confusion, hallucinations, self-injury and suicidal ideas. The patient is not nervous/anxious.      Physical Exam Updated Vital Signs BP 140/85   Pulse 79   Temp 98.2 F (36.8 C)   Resp 16   SpO2 98%   Physical Exam  Constitutional: Vital signs are normal. She appears well-developed and well-nourished. She is active.  HENT:  Head: Normocephalic  and atraumatic.  Right Ear: Tympanic membrane, external ear and ear canal normal.  Left Ear: Tympanic membrane, external ear and ear canal normal.  Nose: Nose normal.  Mouth/Throat: Uvula is midline, oropharynx is clear and moist and mucous membranes are normal.  Nasal congestion noted.  Eyes: Conjunctivae, EOM and lids are normal. Pupils are equal, round, and reactive to light.  Neck: Trachea normal, normal range of motion and phonation normal. Neck supple. Carotid bruit is not present.  Cardiovascular: Normal rate, regular rhythm and normal pulses.   Pulmonary/Chest:  There is symmetrical rise and fall of the chest without problem. Patient speaks in complete sentences.. There are wheezes and rhonchi on the right greater than the left.  Abdominal: Soft. Normal appearance and bowel sounds are normal.  Musculoskeletal: She exhibits no edema.  There is no pitting edema. Capillary refill is less than 2 seconds. The dorsalis pedis pulses are 2+ bilaterally.  Lymphadenopathy:       Head (right side): No submental, no preauricular and no posterior auricular adenopathy present.       Head (left side): No submental, no preauricular and no posterior auricular adenopathy present.    She has no cervical adenopathy.  Neurological: She is alert. She has normal strength. No cranial nerve deficit or sensory deficit. GCS eye subscore is 4. GCS verbal subscore is 5. GCS motor subscore is 6.  Skin: Skin is warm and dry.  Psychiatric: Her speech is normal.  Vitals reviewed.    ED Treatments / Results  Labs (all labs ordered are listed, but only abnormal results are displayed) Labs Reviewed - No data to display  EKG  EKG Interpretation None       Radiology No results found.  Procedures Procedures (including critical care time)  Medications Ordered in ED Medications - No data to display   Initial Impression / Assessment and Plan / ED Course  I have reviewed the triage vital signs and the  nursing notes.  Pertinent labs & imaging results that were available during my care of the patient were reviewed by me and considered in my medical decision making (see chart for details).     Final Clinical Impressions(s) / ED Diagnoses zMDM Vital signs within normal limits. Pulse oximetry ranges 96-98% on room air.  Chest x-ray is negative for any acute problem.  I discussed the findings with the patient. I discussed the plan for discharge with the  patient in terms which he understands. The patient will be treated with albuterol, antibiotics, and cough medication. She will follow-up with Dr. Drexel Iha in for 5 days. She will return to the emergency department if any changes, problems, or concerns. Patient is in agreement with this plan.    Final diagnoses:  Bronchitis  Upper respiratory tract infection, unspecified type    New Prescriptions New Prescriptions   DOXYCYCLINE (VIBRA-TABS) 100 MG TABLET    Take 1 tablet (100 mg total) by mouth 2 (two) times daily.   HYDROCODONE-HOMATROPINE (HYCODAN) 5-1.5 MG/5ML SYRUP    Take 5 mLs by mouth every 6 (six) hours as needed for cough.     Lily Kocher, PA-C 11/24/16 2236    Drenda Freeze, MD 11/24/16 2300

## 2016-11-24 NOTE — ED Triage Notes (Signed)
Pt complaining of cough x 3 weeks. Pt states seen by UC recently for same. Given abx, has since finished them, no improvement. Pt states coughing up yellow/white sputum.

## 2016-11-24 NOTE — Discharge Instructions (Signed)
Your vital signs and oxygen level are within normal limits. Your chest x-ray is negative for acute problem. Please use 2 puffs of albuterol every 4 hours. Please use doxycycline 2 times daily with meals. Increase fluids. Use the Hycodan for cough and congestion. This medication may cause drowsiness, please do not drive, drink, operate machinery, participating in activities requiring concentration when taking this medication.

## 2016-11-27 ENCOUNTER — Ambulatory Visit: Payer: Worker's Compensation | Admitting: Physical Therapy

## 2016-11-27 LAB — PAP, TP IMAGING W/ HPV RNA, RFLX HPV TYPE 16,18/45: HPV mRNA, High Risk: NOT DETECTED

## 2016-11-28 ENCOUNTER — Ambulatory Visit: Payer: Self-pay | Admitting: Family Medicine

## 2016-11-29 ENCOUNTER — Ambulatory Visit: Payer: Medicare Other | Admitting: Physical Therapy

## 2016-12-04 ENCOUNTER — Ambulatory Visit: Payer: Medicare Other | Admitting: Physical Therapy

## 2016-12-04 ENCOUNTER — Ambulatory Visit (INDEPENDENT_AMBULATORY_CARE_PROVIDER_SITE_OTHER): Payer: Medicare Other | Admitting: Orthopaedic Surgery

## 2016-12-04 ENCOUNTER — Telehealth (INDEPENDENT_AMBULATORY_CARE_PROVIDER_SITE_OTHER): Payer: Self-pay

## 2016-12-04 ENCOUNTER — Encounter (INDEPENDENT_AMBULATORY_CARE_PROVIDER_SITE_OTHER): Payer: Self-pay | Admitting: Orthopaedic Surgery

## 2016-12-04 VITALS — Ht 64.0 in | Wt 224.0 lb

## 2016-12-04 DIAGNOSIS — G8929 Other chronic pain: Secondary | ICD-10-CM | POA: Diagnosis not present

## 2016-12-04 DIAGNOSIS — M5442 Lumbago with sciatica, left side: Secondary | ICD-10-CM | POA: Diagnosis not present

## 2016-12-04 NOTE — Telephone Encounter (Signed)
This is a Dr. Ninfa Linden pt that was seen in the office today. Order for MRI in chart but she is a work comp pt. Marked it as external let me know if there is anything else that I need to do. Thanks.

## 2016-12-04 NOTE — Progress Notes (Signed)
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 She is originally patient Dr. Lorin Mercy and she said she did have back surgery about 20 years ago by him. She had done well for long period time until this back injury that occurred several months ago. When I saw her in February she was having a significant amount of pain in her back sleeper on just light duty work and sent her to physical therapy. I will switch to see her back in a month but I think that there are some issues with worker's comp and physical therapy in general. She is now been in physical therapy and she'll still having pain that she says is "ridiculous" it radiates in her low back to the left side down her sciatic region into the groin at all which were foot with numbness and tingling. It is stated the same. Is not getting worse was not getting better. His stature mainly affecting her activities daily living, her quality of life, and her mobility. Is been now 4 months since I saw her last and she's been dealing with this pain though for about 5-6 months or so.  On examination she has a positive straight leg raise to the left side. Just moving her left hip and leg around a general causes her pain but there is fluid range of motion of the left hip with no blocks to this. Her pain seems to be in the facet joints of lumbar spine as well as the sciatic region as. She has limited flexion extension of her lumbar spine as well. She is moderately obese.  At this point an MRI is warranted to rule out herniated disc given her continued radicular symptoms and given the failure of time, rest, heat, ice, anti-inflammatories and physical therapy. Once we have the MRI obtained and what the next treatment steps would be. All questions were encouraged and answered. We'll see her back after the MRI is obtained.

## 2016-12-04 NOTE — Telephone Encounter (Signed)
Faxed auth request and office note to adj Audie Clear.  YB#[phone] [id number]

## 2016-12-05 ENCOUNTER — Encounter: Payer: Self-pay | Admitting: Family Medicine

## 2016-12-05 ENCOUNTER — Ambulatory Visit (INDEPENDENT_AMBULATORY_CARE_PROVIDER_SITE_OTHER): Payer: Medicare Other | Admitting: Family Medicine

## 2016-12-05 VITALS — BP 125/70 | HR 77 | Temp 98.2°F | Ht 64.0 in | Wt 225.4 lb

## 2016-12-05 DIAGNOSIS — R05 Cough: Secondary | ICD-10-CM | POA: Diagnosis present

## 2016-12-05 DIAGNOSIS — R059 Cough, unspecified: Secondary | ICD-10-CM

## 2016-12-05 MED ORDER — CETIRIZINE HCL 10 MG PO TABS: 10.0000 mg | ORAL_TABLET | Freq: Every day | ORAL | 0 refills | Status: DC

## 2016-12-05 MED ORDER — PREDNISONE 50 MG PO TABS: 50.0000 mg | ORAL_TABLET | Freq: Every day | ORAL | 0 refills | Status: DC

## 2016-12-05 NOTE — Patient Instructions (Signed)
Thank you so much for coming to visit today! I have sent a prescription for Prednisone to take one tablet once a day for 5 days. Please continue the Zyrtec as well. I have also sent a prescription for Zyrtec to the pharmacy. Flonase (Fluticasone) nose spray may also help--you may take this over the counter or if no improvement call and we will send a prescription to the pharmacy for you. Please stop using the Afrin nose spray. If no improvement, we may needed to consider a referral to Ear Nose and Throat. Please return if no improvement in 2 weeks.  Dr. Gerlean Ren

## 2016-12-05 NOTE — Progress Notes (Signed)
Subjective:     Patient ID: Alyssa Montgomery, female   DOB: [birth year], 56 y.o.   MRN: [id number]  HPI Alyssa Montgomery is a 56 year old female presenting today for cough. Initially wanted to discuss Worker's Comp, however she is already seeing Dr. Ninfa Linden for this. Discussed that further Workmen's Comp. issues need to be discussed with Dr. Ninfa Linden.  Reports cough for one month. 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 Reports she has experienced cough, congestion, frontal headache, and sore throat. Also notes ear pressure. Denies fever. Resented to urgent care on June 3 and received a course of azithromycin and prednisone taper. Presented to the emergency department on June 16 and received course of doxycycline and a shot of steroids. Reports the steroids seem to help with than the other medications. Currently still taking her course of doxycycline. Had chest x-ray on June 16 which was normal. Does not take allergy medications. Does report she has been using Afrin nose spray for longer than the recommended 3 days. Smoker.  Review of Systems Per HPI    Objective:   Physical Exam  Constitutional: She appears well-developed and well-nourished. No distress.  HENT:  Mouth/Throat: Oropharynx is clear and moist.  Tympanic membranes normal with normal variation middle ear. Sinuses equal to elimination. Tenderness over left maxillary sinus noted.  Cardiovascular: Normal rate and regular rhythm.   No murmur heard. Pulmonary/Chest: Effort normal. No respiratory distress. She has no wheezes.  Musculoskeletal: She exhibits no edema.  Psychiatric: She has a normal mood and affect. Her behavior is normal.      Assessment and Plan:     1. Cough Multiple etiologies possible. Discussed if postinfectious, cough may linger for 4-6 weeks. Has already completed courses of azithromycin and doxycycline. Nasal congestion may also be  secondary to prolonged use of Afrin-recommend discontinuing Afrin at this time. Will start Zyrtec and recommend Flonase over-the-counter for possible allergic component given congestion. Prescription for prednisone sent to pharmacy given previous improvement with steroids. Follow-up if no improvement. May require referral to ENT for further evaluation. May also require PFTs if continues to have productive cough.

## 2016-12-06 ENCOUNTER — Ambulatory Visit: Payer: Medicare Other | Admitting: Cardiology

## 2016-12-06 ENCOUNTER — Ambulatory Visit: Payer: Medicare Other | Admitting: Physical Therapy

## 2017-01-01 ENCOUNTER — Ambulatory Visit: Payer: Medicare Other | Admitting: Gynecology

## 2017-01-02 ENCOUNTER — Other Ambulatory Visit (INDEPENDENT_AMBULATORY_CARE_PROVIDER_SITE_OTHER): Payer: Self-pay

## 2017-01-02 ENCOUNTER — Telehealth (INDEPENDENT_AMBULATORY_CARE_PROVIDER_SITE_OTHER): Payer: Self-pay | Admitting: Orthopaedic Surgery

## 2017-01-02 DIAGNOSIS — M791 Myalgia, unspecified site: Secondary | ICD-10-CM

## 2017-01-02 MED ORDER — DICLOFENAC SODIUM 1 % TD GEL: 2.0000 g | Freq: Every day | TRANSDERMAL | 2 refills | Status: DC | PRN

## 2017-01-02 NOTE — Telephone Encounter (Signed)
PT ASKED IF SHE CAN HAVE VOLTUREN GEL.  ALSO STATED SHE IS SUPPOSE TO HAVE AN MRI AND WC PAY FOR THIS BUT SHE NEVER HEARD ANYTHING BACK. SHE STATED SHE WILL PAY FOR MRI HERSELF BECAUSE SHE JUST WANTS THIS TAKEN CARE OF.  PLEASE ADVISE.  [phone]

## 2017-01-02 NOTE — Telephone Encounter (Signed)
Can you help me with this?

## 2017-01-04 NOTE — Telephone Encounter (Signed)
Left vm for pt to call back to discuss.

## 2017-01-08 ENCOUNTER — Ambulatory Visit: Payer: Medicare Other | Admitting: Gynecology

## 2017-01-09 NOTE — Telephone Encounter (Signed)
WC claim is denied per Cleta Alberts at [facility]. Sent separate message to Gabriel Cirri about getting MRI scheduled under health ins.

## 2017-01-28 ENCOUNTER — Telehealth: Payer: Self-pay | Admitting: Neurology

## 2017-01-28 NOTE — Telephone Encounter (Signed)
PT left a message that she needed a call back from Dr Delice Lesch or her nurse but did not say why

## 2017-01-29 ENCOUNTER — Telehealth (INDEPENDENT_AMBULATORY_CARE_PROVIDER_SITE_OTHER): Payer: Self-pay | Admitting: Radiology

## 2017-01-29 ENCOUNTER — Other Ambulatory Visit: Payer: Self-pay

## 2017-01-29 ENCOUNTER — Ambulatory Visit
Admission: RE | Admit: 2017-01-29 | Discharge: 2017-01-29 | Disposition: A | Discharge status: Home / Self Care | Payer: Medicare Other | Source: Ambulatory Visit | Attending: Orthopaedic Surgery | Admitting: Orthopaedic Surgery

## 2017-01-29 DIAGNOSIS — M5442 Lumbago with sciatica, left side: Principal | ICD-10-CM

## 2017-01-29 DIAGNOSIS — M48061 Spinal stenosis, lumbar region without neurogenic claudication: Secondary | ICD-10-CM | POA: Diagnosis not present

## 2017-01-29 DIAGNOSIS — G8929 Other chronic pain: Secondary | ICD-10-CM

## 2017-01-29 NOTE — Telephone Encounter (Signed)
Spoke with Alyssa Montgomery.  Apparently she received a call from Dr. Jenny Reichmann letting her know that he placed a neuro referral (per her request) for her mother and was calling to schedule her mother's first appointment with Dr. Delice Lesch.  Let her know that I do not schedule new pt appointments. Parked call and spoke with Dawn at our front desk.  We have not received the referral yet on our end and Dawn told [name] that our office will call to schedule once we have the referral.

## 2017-01-30 ENCOUNTER — Encounter (INDEPENDENT_AMBULATORY_CARE_PROVIDER_SITE_OTHER): Payer: Self-pay | Admitting: Physician Assistant

## 2017-01-30 ENCOUNTER — Ambulatory Visit (INDEPENDENT_AMBULATORY_CARE_PROVIDER_SITE_OTHER): Payer: Medicare Other | Admitting: Physician Assistant

## 2017-01-30 DIAGNOSIS — M25511 Pain in right shoulder: Secondary | ICD-10-CM

## 2017-01-30 DIAGNOSIS — M7062 Trochanteric bursitis, left hip: Secondary | ICD-10-CM | POA: Diagnosis not present

## 2017-01-30 DIAGNOSIS — G8929 Other chronic pain: Secondary | ICD-10-CM | POA: Diagnosis not present

## 2017-01-30 DIAGNOSIS — M545 Low back pain: Secondary | ICD-10-CM | POA: Diagnosis not present

## 2017-01-30 MED ORDER — BUPIVACAINE HCL 0.25 % IJ SOLN
4.0000 mL | INTRAMUSCULAR | Status: AC | PRN
  Administered 2017-01-30: 4 mL via INTRA_ARTICULAR

## 2017-01-30 MED ORDER — METHYLPREDNISOLONE ACETATE 40 MG/ML IJ SUSP
40.0000 mg | INTRAMUSCULAR | Status: AC | PRN
  Administered 2017-01-30: 40 mg via INTRA_ARTICULAR

## 2017-01-30 NOTE — Progress Notes (Signed)
Office Visit Note   Patient: Alyssa Montgomery           Date of Birth: 1961/04/11           MRN: [id number] Visit Date: 01/30/2017              Requested by: Alyssa Coombe, MD [address] [place], [place] [address] PCP: Alyssa Few, DO   Assessment & Plan: Visit Diagnoses:  1. Chronic bilateral low back pain, with sciatica presence unspecified   2. Chronic right shoulder pain   3. Trochanteric bursitis, left hip     Plan: We will have her do pendulum, Codman, wall crawls and forward flexion exercises shown today. Also discussed with her and demonstrated IT band stretching exercises for the left hip. Her trochanteric bursitis could be definitely related to her back as her gait has been abnormal with her low back pain. Sent her for transforaminal epidural steroid injections with Alyssa Montgomery near future. See her back in a month check her progress lack of.  Follow-Up Instructions: Return in about 4 weeks (around 02/27/2017).   Orders:  Orders Placed This Encounter  Procedures  . Large Joint Injection/Arthrocentesis  . Large Joint Injection/Arthrocentesis  . Ambulatory referral to Physical Medicine Rehab   No orders of the defined types were placed in this encounter.     Procedures: Large Joint Inj Date/Time: 01/30/2017 3:45 PM Performed by: Alyssa Montgomery Authorized by: Alyssa Montgomery   Consent Given by:  Patient Indications:  Pain Location:  Hip Site:  L greater trochanter Needle Size:  22 G Needle Length:  1.5 inches Approach:  Lateral Ultrasound Guidance: No   Fluoroscopic Guidance: No   Arthrogram: No   Medications:  40 mg methylPREDNISolone acetate 40 MG/ML; 4 mL bupivacaine 0.25 % Aspiration Attempted: No   Patient tolerance:  Patient tolerated the procedure well with no immediate complications Large Joint Inj Date/Time: 01/30/2017 3:47 PM Performed by: Alyssa Montgomery Authorized by: Alyssa Montgomery   Consent Given by:  Patient Indications:   Pain Location:  Shoulder Site:  R subacromial bursa Needle Size:  22 G Needle Length:  1.5 inches Approach:  Anterolateral Ultrasound Guidance: No   Fluoroscopic Guidance: No   Arthrogram: No   Medications:  4 mL bupivacaine 0.25 %; 40 mg methylPREDNISolone acetate 40 MG/ML Aspiration Attempted: No   Patient tolerance:  Patient tolerated the procedure well with no immediate complications     Clinical Data: No additional findings.   Subjective: Chief Complaint  Patient presents with  . Lower Back - Pain    HPI Alyssa Montgomery 56 year old female who returns today status post MRI of lumbar spine. 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 She was on the job in January 2018 and was helping an elderly patient will legs gave way on the wheelchair and the patient is now landing on Alyssa Montgomery. She continues to have low back pain which is radiating down her left leg to the knee. She also mentioned to Alyssa Montgomery at the February visit that she was having right shoulder pain. Right shoulder pain continues. She has decreased range of motion the shoulder. She's tried therapy for both the back and the shoulder. Maximal relief for her shoulder pain is to place a ball in her axillary region. She is having no radicular symptoms down  the right arm. No bowel bladder dysfunction. Does report occasional numbness in her left foot. She again reiterates that she was having no back pain at the time prior to the injury earlier this year. She did have a remote history of back surgery.  MRI dated 01/29/2017 lumbar spine: Shows left foraminal disc protrusion with moderate left foraminal stenosis. No spinal canal or right foraminal stenosis. L3-L4 moderate bilateral facet hypertrophy with mild bilateral neural foraminal stenosis. MRI images were reviewed with the patient also a lumbar spine model  was used for visualization purposes.  Review of Systems Please see history of present illness otherwise negative  Objective: Vital Signs: There were no vitals taken for this visit.  Physical Exam  Constitutional: She is oriented to person, place, and time. She appears well-developed and well-nourished.  Pulmonary/Chest: Effort normal.  Neurological: She is alert and oriented to person, place, and time.  Skin: She is not diaphoretic.  Psychiatric: She has a normal mood and affect. Her behavior is normal.    Ortho Exam Bilateral shoulders 5 out of 5 strengths with external and internal rotation against resistance. Positive impingement on the right negative on the left. He can test negative bilaterally. She has tenderness globally about the right shoulder girdle. Good range of motion of elbows without pain bilaterally. Left hip tenderness over the greater trochanteric region. 5 out of 5 strengths throughout the lower extremities against resistance. Positive straight leg raise on the left negative on the right. Specialty Comments:  No specialty comments available.  Imaging: Mr Lumbar Spine Wo Contrast  Result Date: 01/30/2017 CLINICAL DATA:  Low back pain and left hip pain EXAM: MRI LUMBAR SPINE WITHOUT CONTRAST TECHNIQUE: Multiplanar, multisequence MR imaging of the lumbar spine was performed. No intravenous contrast was administered. COMPARISON:  Lumbar spine radiograph 01/29/2017 FINDINGS: Segmentation:  Standard. Alignment:  Physiologic. Vertebrae:  No fracture, evidence of discitis, or bone lesion. Conus medullaris: Extends to the L1-2 level and appears normal. Paraspinal and other soft tissues: Normal Disc levels: T11-T12: Small disc bulge. No spinal canal or neural foraminal stenosis. T12-L1: Normal disc space and facets. No spinal canal or neuroforaminal stenosis. L1-L2: Normal disc space and facets. No spinal canal or neuroforaminal stenosis. L2-L3: Left foraminal disc protrusion with  moderate left foraminal stenosis. No spinal canal stenosis or right neural foraminal stenosis. L3-L4: Intermediate disc bulge with mild spinal canal stenosis. Moderate bilateral facet hypertrophy. Mild bilateral neural foraminal stenosis. L4-L5: Normal disc space and facets. No spinal canal or neuroforaminal stenosis. L5-S1: Normal disc space and facets. No spinal canal or neuroforaminal stenosis. Visualized sacrum: Normal. IMPRESSION: 1. 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 2. Mild bilateral L3-4 neural foraminal stenosis, multifactorial. 3. No lumbar spinal canal stenosis. Electronically Signed   By: Ulyses Jarred M.D.   On: 01/30/2017 02:02     PMFS History: Patient Active Problem List   Diagnosis Date Noted  . Anxiety 11/13/2016  . 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 Forgetfulness 09/06/2016  . Hypocalcemia 09/06/2016  . Claudication ([place]) 09/03/2016  . Palpitations 09/03/2016  . Other chest pain 08/10/2016  . Sinus infection 08/10/2016  . TIA (transient ischemic attack) 08/10/2016  . Weakness generalized 08/10/2016  . Near syncope 08/09/2016  . Bleeding from left ear 02/23/2016  . History of cervical dysplasia 01/18/2016  . Postoperative state 12/27/2015  . Diabetes ([place]) 04/17/2015  . Healthcare maintenance 04/17/2015  . Abscess 04/17/2015  . Tobacco use disorder 04/17/2015  . Right rotator  cuff tendinitis 11/16/2014  . Neck pain on right side 11/16/2014  . Shoulder pain, right 11/03/2014  . Lower abdominal pain 08/05/2014  . Fibromyalgia 06/18/2014  . Preventative health care 01/21/2014  . Sciatic pain 10/15/2013  . Trigger point of neck 05/01/2013  . Goiter 02/28/2013  . Wheeze 12/08/2012  . Hematuria 06/19/2012  . Back pain 06/19/2012  . Tobacco abuse 01/18/2012  . FIBROIDS, UTERUS 03/09/2010  . DEPRESSION 12/26/2006  . GERD 12/26/2006   Past Medical History:  Diagnosis Date  . Anxiety   . Bronchitis   .  Diverticulitis   . Fibromyalgia   . GERD (gastroesophageal reflux disease)   . Headache   . Substance abuse    10 years ago ( cocaine)    Family History  Problem Relation Age of Onset  . Cancer Father        lung  . Hypertension Mother   . Arthritis Mother   . Other Neg Hx        pituitary problem    Past Surgical History:  Procedure Laterality Date  . ANKLE SURGERY    . BACK SURGERY    . Bowel obstruction    . CERVICAL CONIZATION W/BX N/A 12/27/2015   Procedure: CONIZATION CERVIX WITH BIOPSY;  Surgeon: Terrance Mass, MD;  Location: [facility];  Service: Gynecology;  Laterality: N/A;  . DILATATION & CURETTAGE/HYSTEROSCOPY WITH MYOSURE N/A 12/27/2015   Procedure: DILATATION & CURETTAGE/HYSTEROSCOPY WITH MYOSURE;  Surgeon: Terrance Mass, MD;  Location: [facility];  Service: Gynecology;  Laterality: N/A;  . ECTOPIC PREGNANCY SURGERY    . EYE SURGERY     removed right eye;    Social History   Occupational History  . Not on file.   Social History Main Topics  . Smoking status: Current Every Day Smoker    Packs/day: 0.50    Years: 18.00    Types: Cigarettes  . Smokeless tobacco: Never Used     Comment: 1-800 number given   . Alcohol use 0.6 oz/week    1 Glasses of wine per week     Comment: OCCASIONAL  . 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 Sexual activity: Yes    Partners: Male    Birth control/ protection: Post-menopausal

## 2017-02-21 ENCOUNTER — Telehealth (INDEPENDENT_AMBULATORY_CARE_PROVIDER_SITE_OTHER): Payer: Self-pay | Admitting: Physician Assistant

## 2017-02-21 NOTE — Telephone Encounter (Signed)
Called patient left message on voicemail to call back to reschedule appointment   Advised canceled appointment today

## 2017-02-28 ENCOUNTER — Ambulatory Visit (INDEPENDENT_AMBULATORY_CARE_PROVIDER_SITE_OTHER): Payer: Medicare Other | Admitting: Physician Assistant

## 2017-02-28 ENCOUNTER — Encounter (HOSPITAL_COMMUNITY): Payer: Self-pay | Admitting: Emergency Medicine

## 2017-02-28 ENCOUNTER — Emergency Department (HOSPITAL_COMMUNITY)
Admission: EM | Admit: 2017-02-28 | Discharge: 2017-02-28 | Disposition: A | Discharge status: Home / Self Care | Payer: Medicare Other | Attending: Emergency Medicine | Admitting: Emergency Medicine

## 2017-02-28 DIAGNOSIS — W5501XA Bitten by cat, initial encounter: Secondary | ICD-10-CM | POA: Diagnosis not present

## 2017-02-28 DIAGNOSIS — Z79899 Other long term (current) drug therapy: Secondary | ICD-10-CM | POA: Insufficient documentation

## 2017-02-28 DIAGNOSIS — E119 Type 2 diabetes mellitus without complications: Secondary | ICD-10-CM | POA: Diagnosis not present

## 2017-02-28 DIAGNOSIS — Z203 Contact with and (suspected) exposure to rabies: Secondary | ICD-10-CM | POA: Diagnosis not present

## 2017-02-28 DIAGNOSIS — Z2914 Encounter for prophylactic rabies immune globin: Secondary | ICD-10-CM | POA: Diagnosis not present

## 2017-02-28 DIAGNOSIS — M25571 Pain in right ankle and joints of right foot: Secondary | ICD-10-CM | POA: Diagnosis present

## 2017-02-28 DIAGNOSIS — Y929 Unspecified place or not applicable: Secondary | ICD-10-CM | POA: Diagnosis not present

## 2017-02-28 DIAGNOSIS — S90511A Abrasion, right ankle, initial encounter: Secondary | ICD-10-CM | POA: Insufficient documentation

## 2017-02-28 DIAGNOSIS — Z23 Encounter for immunization: Secondary | ICD-10-CM | POA: Diagnosis not present

## 2017-02-28 DIAGNOSIS — Y999 Unspecified external cause status: Secondary | ICD-10-CM | POA: Diagnosis not present

## 2017-02-28 DIAGNOSIS — S91351A Open bite, right foot, initial encounter: Secondary | ICD-10-CM | POA: Diagnosis not present

## 2017-02-28 DIAGNOSIS — Y939 Activity, unspecified: Secondary | ICD-10-CM | POA: Insufficient documentation

## 2017-02-28 DIAGNOSIS — F17210 Nicotine dependence, cigarettes, uncomplicated: Secondary | ICD-10-CM | POA: Diagnosis not present

## 2017-02-28 MED ORDER — RABIES IMMUNE GLOBULIN 150 UNIT/ML IM INJ
20.0000 [IU]/kg | INJECTION | Freq: Once | INTRAMUSCULAR | Status: AC
  Administered 2017-02-28: 1500 [IU]
  Filled 2017-02-28: qty 13.5

## 2017-02-28 MED ORDER — AMOXICILLIN-POT CLAVULANATE 875-125 MG PO TABS
1.0000 | ORAL_TABLET | Freq: Once | ORAL | Status: AC
  Administered 2017-02-28: 1 via ORAL
  Filled 2017-02-28: qty 1

## 2017-02-28 MED ORDER — NAPROXEN 250 MG PO TABS
500.0000 mg | ORAL_TABLET | Freq: Once | ORAL | Status: AC
  Administered 2017-02-28: 500 mg via ORAL
  Filled 2017-02-28: qty 2

## 2017-02-28 MED ORDER — NAPROXEN 375 MG PO TABS: 375.0000 mg | ORAL_TABLET | Freq: Three times a day (TID) | ORAL | 0 refills | Status: DC

## 2017-02-28 MED ORDER — RABIES VACCINE, PCEC IM SUSR
1.0000 mL | Freq: Once | INTRAMUSCULAR | Status: AC
  Administered 2017-02-28: 1 mL via INTRAMUSCULAR
  Filled 2017-02-28: qty 1

## 2017-02-28 MED ORDER — AMOXICILLIN-POT CLAVULANATE 875-125 MG PO TABS: 1.0000 | ORAL_TABLET | Freq: Two times a day (BID) | ORAL | 0 refills | Status: DC

## 2017-02-28 MED ORDER — AMOXICILLIN-POT CLAVULANATE 875-125 MG PO TABS: 1.0000 | ORAL_TABLET | Freq: Two times a day (BID) | ORAL | 0 refills | Status: AC

## 2017-02-28 NOTE — ED Notes (Signed)
Pt has 2 puncture wounds to right ankle from a cat bite

## 2017-02-28 NOTE — ED Triage Notes (Signed)
P presents to ED for assessment of right ankle after being bit by a feral cat last night.  Pt c.o increasing pain and redness.

## 2017-02-28 NOTE — ED Provider Notes (Signed)
Alyssa Montgomery Provider Note   CSN: [id number] Arrival date & time: 02/28/17  1928     History   Chief Complaint Chief Complaint  Patient presents with  . Animal Bite    HPI Alyssa Montgomery is a 56 y.o. female.  HPI  Patient is a 56 year old female with a history of GERD and fibromyalgia presenting 24 hours after a cat bite. Patient reports that she had been feeding a stray cat but it suddenly bit her yesterday. Subsequently patient has had increasing erythema, pain, and swelling of her right ankle. Patient immediately cleaned out the wound with soap and water and alcohol. Patient reports that she has had increasing pain throughout the day with range of motion of her foot. Patient reports that movement produces a 10 out of 10 pain. Patient denies any fever, chills, muscular weakness, numbness to her right distal extremity. 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 Patient's last Tdap in June 2017. Patient is not diabetic.  Patient having some calf tenderness. No unilateral leg swelling, redness, or calf TTP, no immobilization or surgery in previous 4 weeks, no personal history of DVT/PE, and no treatment of active malignancy in previous 6 months.  Past Medical History:  Diagnosis Date  . Anxiety   . Bronchitis   . Diverticulitis   . Fibromyalgia   . GERD (gastroesophageal reflux disease)   . Headache   . Substance abuse    10 years ago ( cocaine)    Patient Active Problem List   Diagnosis Date Noted  . Anxiety 11/13/2016  . 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) 09/06/2016  . Forgetfulness 09/06/2016  . Hypocalcemia 09/06/2016  . Claudication ([place]) 09/03/2016  . Palpitations 09/03/2016  . Other chest pain 08/10/2016  . Sinus infection 08/10/2016  . TIA (transient ischemic attack) 08/10/2016  . Weakness generalized 08/10/2016  . Near syncope 08/09/2016  . Bleeding from left ear 02/23/2016  . History of cervical  dysplasia 01/18/2016  . Postoperative state 12/27/2015  . Diabetes ([place]) 04/17/2015  . Healthcare maintenance 04/17/2015  . Abscess 04/17/2015  . Tobacco use disorder 04/17/2015  . Right rotator cuff tendinitis 11/16/2014  . Neck pain on right side 11/16/2014  . Shoulder pain, right 11/03/2014  . Lower abdominal pain 08/05/2014  . Fibromyalgia 06/18/2014  . Preventative health care 01/21/2014  . Sciatic pain 10/15/2013  . Trigger point of neck 05/01/2013  . Goiter 02/28/2013  . Wheeze 12/08/2012  . Hematuria 06/19/2012  . Back pain 06/19/2012  . Tobacco abuse 01/18/2012  . FIBROIDS, UTERUS 03/09/2010  . DEPRESSION 12/26/2006  . GERD 12/26/2006    Past Surgical History:  Procedure Laterality Date  . ANKLE SURGERY    . BACK SURGERY    . Bowel obstruction    . CERVICAL CONIZATION W/BX N/A 12/27/2015   Procedure: CONIZATION CERVIX WITH BIOPSY;  Surgeon: Terrance Mass, MD;  Location: [facility];  Service: Gynecology;  Laterality: N/A;  . DILATATION & CURETTAGE/HYSTEROSCOPY WITH MYOSURE N/A 12/27/2015   Procedure: DILATATION & CURETTAGE/HYSTEROSCOPY WITH MYOSURE;  Surgeon: Terrance Mass, MD;  Location: [facility];  Service: Gynecology;  Laterality: N/A;  . ECTOPIC PREGNANCY SURGERY    . EYE SURGERY     removed right eye;     OB History    Gravida Para Term Preterm AB Living   6 2 2   4 2    SAB TAB Ectopic Multiple Live Births   2 1 1  Home Medications    Prior to Admission medications   Medication Sig Start Date End Date Taking? Authorizing Provider  acetaminophen (TYLENOL) 325 MG tablet Take 650-975 mg by mouth 2 (two) times daily as needed for mild pain.    [provider]  albuterol (PROVENTIL HFA;VENTOLIN HFA) 108 (90 BASE) MCG/ACT inhaler Inhale 2 puffs into the lungs every 6 (six) hours as needed for wheezing. 04/15/15   Lorna Few, DO  Artificial Tear Solution (SOOTHE HYDRATION OP) Place 1 Dose into the left eye 2 (two) times daily. Reported  on 12/26/2015    [provider]  benzonatate (TESSALON) 100 MG capsule Take 2 capsules (200 mg total) by mouth 3 (three) times daily as needed for cough. 11/11/16   Lysbeth Penner, FNP  bismuth subsalicylate (PEPTO BISMOL) 262 MG/15ML suspension Take 30 mLs by mouth every 6 (six) hours as needed for indigestion.    [provider]  Ca Carbonate-Mag Hydroxide (ROLAIDS PO) Take 1-2 tablets by mouth 4 (four) times daily as needed (For indigestion.). Reported on 12/26/2015    [provider]  cetirizine (ZYRTEC) 10 MG tablet Take 1 tablet (10 mg total) by mouth daily. 12/05/16   Lorna Few, DO  Cholecalciferol (D3-1000) 1000 units tablet Take 1,000 Units by mouth daily. Reported on 12/26/2015    [provider]  Coenzyme Q10 (COQ-10) 200 MG CAPS Take 1 capsule by mouth daily. Reported on 12/26/2015    [provider]  diclofenac sodium (VOLTAREN) 1 % GEL Apply 2 g topically daily as needed (For muscle pain.). Reported on 12/26/2015 01/02/17   Mcarthur Rossetti, MD  doxycycline (VIBRA-TABS) 100 MG tablet Take 1 tablet (100 mg total) by mouth 2 (two) times daily. 11/24/16   Lily Kocher, PA-C  HYDROcodone-homatropine [facility]) 5-1.5 MG/5ML syrup Take 5 mLs by mouth every 6 (six) hours as needed for cough. 11/24/16   Lily Kocher, PA-C  Menthol-Methyl Salicylate (MUSCLE RUB) 10-15 % CREA Apply 1 application topically as needed for muscle pain. Reported on 12/26/2015    [provider]  methylPREDNISolone (MEDROL DOSEPAK) 4 MG TBPK tablet Take 6-5-4-3-2-1 po qd 11/11/16   Lysbeth Penner, FNP  Multiple Vitamin (MULTIVITAMIN WITH MINERALS) TABS tablet Take 1 tablet by mouth daily. Reported on 12/26/2015    [provider]  ofloxacin (OCUFLOX) 0.3 % ophthalmic solution Place 2 drops into the left eye daily. Reported on 12/26/2015    [provider]  predniSONE (DELTASONE) 50 MG tablet Take 1 tablet (50 mg total) by mouth daily with  breakfast. 12/05/16   Rumley, [place] N, DO  promethazine (PHENERGAN) 25 MG tablet Take 25 mg by mouth every 6 (six) hours as needed for nausea or vomiting. Reported on 12/26/2015    [provider]  simethicone (MYLICON) 284 MG chewable tablet Chew 125 mg by mouth every 6 (six) hours as needed for flatulence. Reported on 12/26/2015    [provider]    Family History Family History  Problem Relation Age of Onset  . Cancer Father        lung  . Hypertension Mother   . Arthritis Mother   . Other Neg Hx        pituitary problem    Social History Social History  Substance Use Topics  . Smoking status: Current Every Day Smoker    Packs/day: 0.50    Years: 18.00    Types: Cigarettes  . Smokeless tobacco: Never Used     Comment:  1-800 number given   . Alcohol use 0.6 oz/week    1 Glasses of wine per week     Comment: OCCASIONAL     Allergies   Zanaflex [tizanidine hcl]; Penicillins; and Sulfonamide derivatives   Review of Systems Review of Systems  Constitutional: Negative for chills and fever.  Gastrointestinal: Negative for nausea and vomiting.  Skin: Positive for color change and rash.  Neurological: Negative for weakness and numbness.     Physical Exam Updated Vital Signs BP (!) 136/93 (BP Location: Right Arm)   Pulse 73   Temp 98.1 F (36.7 C) (Oral)   Resp 18   Wt 101.6 kg (224 lb)   SpO2 100%   BMI 38.45 kg/m   Physical Exam  Constitutional: She appears well-developed and well-nourished. No distress.  Sitting comfortably in bed.  HENT:  Head: Normocephalic and atraumatic.  Eyes: Conjunctivae are normal. Right eye exhibits no discharge. Left eye exhibits no discharge.  EOMs normal to gross examination.  Neck: Normal range of motion.  Cardiovascular: Normal rate, regular rhythm and intact distal pulses.   Intact, 2+ radial pulse. 2+ DP pulses.  Pulmonary/Chest:  Normal respiratory effort. Patient converses comfortably. No audible  wheeze or stridor.  Abdominal: She exhibits no distension.  Musculoskeletal:  Patient has erythema over dorsum of foot and lateral malleolus. Puncture site of Bite noted over dorsum of foot. Pain to palpation of right foot and ankle and pain to palpation of the lower right calf. Patient has full range of motion of right plantar flexion, extension, inversion, eversion but reduced due to pain.  Neurological: She is alert.  Cranial nerves intact to gross observation. Patient moves extremities without difficulty.  Skin: Skin is warm and dry. She is not diaphoretic.  Psychiatric: She has a normal mood and affect. Her behavior is normal. Judgment and thought content normal.  Nursing note and vitals reviewed.    ED Treatments / Results  Labs (all labs ordered are listed, but only abnormal results are displayed) Labs Reviewed - No data to display  EKG  EKG Interpretation None       Radiology No results found.  Procedures Procedures (including critical care time)  Medications Ordered in ED Medications  rabies vaccine (RABAVERT) injection 1 mL (not administered)  rabies immune globulin (HYPERAB/KEDRAB) injection 2,025 Units (not administered)  amoxicillin-clavulanate (AUGMENTIN) 875-125 MG per tablet 1 tablet (not administered)  naproxen (NAPROSYN) tablet 500 mg (not administered)     Initial Impression / Assessment and Plan / ED Course  I have reviewed the triage vital signs and the nursing notes.  Pertinent labs & imaging results that were available during my care of the patient were reviewed by me and considered in my medical decision making (see chart for details).    Final Clinical Impressions(s) / ED Diagnoses   Final diagnoses:  None   MDM  Patient is a 56 year old female with a history of GERD and fibromyalgia presenting 24 hours after a cat bite. Patient reports that she had been feeding a stray cat but it suddenly bit her yesterday. Patient is having pain and  swelling surrounding bite wounds indicating need for treatment with Augmentin at this time. Additionally, as patient does not know the rabies vaccination status of this stray animal, patient will initiate rabies vaccination series today. Patient given strict return precautions for any worsening erythema, swelling, purulent drainage, systemic symptoms patient is in understanding and agrees with the plan of care.   Nursing notes reviewed. Vital signs  reviewed. All questions answered by patient and family.  New Prescriptions New Prescriptions   No medications on file     Tamala Julian 02/28/17 2146    Gareth Morgan, MD 03/07/17 1245

## 2017-02-28 NOTE — Discharge Instructions (Signed)
Please see the information and instructions below regarding your visit.  Your diagnoses today include: No diagnosis found.  Cats have a high risk of transmitting an infection from a bacteria called Pasteurella.  You were given a dose of Augmentin today. This is an antibiotic specific for this infection. You are also given the first vaccinations of your rabies series. There will be an additional 4 doses to receive. You may receive these vaccinations at the [facility] urgent care here on the campus of [facility].  Tests performed today include: See side panel of your discharge paperwork for testing performed today. Vital signs are listed at the bottom of these instructions.   Medications prescribed:    1. Augmentin. Please take all of your antibiotics until finished.   You may develop abdominal discomfort or nausea from the antibiotic. If this occurs, you may take it with food. Some patients also get diarrhea with antibiotics. You may help offset this with probiotics which you can buy or get in yogurt. Do not eat or take the probiotics until 2 hours after your antibiotic. Some women develop vaginal yeast infections after antibiotics. If you develop unusual vaginal discharge after being on this medication, please see your primary care provider.   Some people develop allergies to antibiotics. Symptoms of antibiotic allergy can be mild and include a flat rash and itching. They can also be more serious and include:  ?Hives - Hives are raised, red patches of skin that are usually very itchy.  ?Lip or tongue swelling  ?Trouble swallowing or breathing  ?Blistering of the skin or mouth.  If you have any of these serious symptoms, please seek emergency medical care immediately.  2. You are prescribed Naproxen, a non-steroidal anti-inflammatory agent (NSAID) for pain. You may take 250 mg every 8 hours as needed for pain. If still requiring this medication around the clock for acute pain after 10 days,  please see your primary healthcare provider.  You may combine this medication with Tylenol, 650 mg every 6 hours, so you are receiving something for pain every 3 hours.  This is not a long-term medication unless under the care and direction of your primary provider. Taking this medication long-term and not under the supervision of a healthcare provider could increase the risk of stomach ulcers, kidney problems, and cardiovascular problems such as high blood pressure.      Take any prescribed medications only as prescribed, and any over the counter medications only as directed on the packaging.  Home care instructions:  Please follow any educational materials contained in this packet.   Keep the foot elevated when you are not walking. Please apply ice at least 3 times a day to the inflamed area for 15-20 minutes. Please do not apply ice directly on his skin.  Follow-up instructions: Please follow-up with your primary care provider in 5-7 days for further evaluation of your symptoms if they are not completely improved.   Please follow up with [place] urgent care in 3 days for your next set of vaccinations. Please follow the sheet with the vaccination series for your subsequent visits.   Return instructions:  Please return to the Emergency Department if you experience worsening symptoms.  Please return to the emergency department for any increased pain, swelling, redness, drainage that is yellow or green from your wound, inability to move your ankle, numbness or loss of color in your right foot, or systemic symptoms such as fever, chills, nausea, or vomiting. Please return if  you have any other emergent concerns.  Additional Information: We administered the first series of your rabies vaccinations today. Instructions on these vaccinations are listed in this packet. Your tetanus vaccination was updated by your primary care provider in 2017.  Your vital signs today were: BP (!) 136/93 (BP  Location: Right Arm)    Pulse 73    Temp 98.1 F (36.7 C) (Oral)    Resp 18    Wt 101.6 kg (224 lb)    SpO2 100%    BMI 38.45 kg/m  If your blood pressure (BP) was elevated on multiple readings during this visit above 130 for the top number or above 80 for the bottom number, please have this repeated by your primary care provider within one month. --------------  Thank you for allowing [place] to participate in your care today.

## 2017-03-02 NOTE — ED Provider Notes (Signed)
ADDENDUM: 03/02/17  Patient returned to ED with concern for prescription for Augmentin. She states pharmacist would not fill the prescription due to allergy. Discussed with in-hospital pharmacist who recommends Cipro 500 mg bid x 7 days as alternative. Handwritten Rx provided to the patient.   Recheck of the wound to right ankle: 2 puncture wounds, both with minimal surrounding erythema. No streaking, swelling or drainage.   She is due for 2nd rabies vaccination tomorrow morning and will return at that time to maintain schedule.   Charlann Lange, PA-C 03/02/17 2232    Charlann Lange, PA-C 03/02/17 [id number]    Gareth Morgan, MD 03/07/17 1315

## 2017-03-03 ENCOUNTER — Encounter (HOSPITAL_COMMUNITY): Payer: Self-pay | Admitting: Emergency Medicine

## 2017-03-03 ENCOUNTER — Ambulatory Visit (HOSPITAL_COMMUNITY)
Admission: EM | Admit: 2017-03-03 | Discharge: 2017-03-03 | Disposition: A | Discharge status: Home / Self Care | Payer: Medicare Other | Attending: Internal Medicine | Admitting: Internal Medicine

## 2017-03-03 DIAGNOSIS — Z23 Encounter for immunization: Secondary | ICD-10-CM | POA: Diagnosis not present

## 2017-03-03 DIAGNOSIS — Z203 Contact with and (suspected) exposure to rabies: Secondary | ICD-10-CM | POA: Diagnosis not present

## 2017-03-03 MED ORDER — RABIES VACCINE, PCEC IM SUSR
INTRAMUSCULAR | Status: AC
  Filled 2017-03-03: qty 1

## 2017-03-03 MED ORDER — RABIES VACCINE, PCEC IM SUSR
1.0000 mL | Freq: Once | INTRAMUSCULAR | Status: AC
  Administered 2017-03-03: 1 mL via INTRAMUSCULAR

## 2017-03-03 NOTE — Discharge Instructions (Signed)
Return on 9/27 for day 7.

## 2017-03-03 NOTE — ED Triage Notes (Signed)
Pt here for day 3 rabies vaccination .... Voices no new concerns  A&O x4... NAD... Ambulatory

## 2017-03-08 ENCOUNTER — Encounter (HOSPITAL_COMMUNITY): Payer: Self-pay | Admitting: Emergency Medicine

## 2017-03-08 ENCOUNTER — Ambulatory Visit (HOSPITAL_COMMUNITY)
Admission: EM | Admit: 2017-03-08 | Discharge: 2017-03-08 | Disposition: A | Discharge status: Home / Self Care | Payer: Medicare Other | Attending: Internal Medicine | Admitting: Internal Medicine

## 2017-03-08 DIAGNOSIS — Z23 Encounter for immunization: Secondary | ICD-10-CM | POA: Diagnosis not present

## 2017-03-08 DIAGNOSIS — Z203 Contact with and (suspected) exposure to rabies: Secondary | ICD-10-CM | POA: Diagnosis not present

## 2017-03-08 MED ORDER — RABIES VACCINE, PCEC IM SUSR
INTRAMUSCULAR | Status: AC
  Filled 2017-03-08: qty 1

## 2017-03-08 MED ORDER — RABIES VACCINE, PCEC IM SUSR
1.0000 mL | Freq: Once | INTRAMUSCULAR | Status: AC
  Administered 2017-03-08: 1 mL via INTRAMUSCULAR

## 2017-03-08 NOTE — ED Triage Notes (Signed)
Pt here for day 7 rabies vaccination... Voices no new concerns.  A&O x4.... NAD... Ambulatory

## 2017-03-08 NOTE — Discharge Instructions (Signed)
Return on 03/14/17 for day 14

## 2017-03-08 NOTE — Telephone Encounter (Signed)
error 

## 2017-03-15 ENCOUNTER — Encounter (INDEPENDENT_AMBULATORY_CARE_PROVIDER_SITE_OTHER): Payer: Self-pay

## 2017-04-03 ENCOUNTER — Other Ambulatory Visit (INDEPENDENT_AMBULATORY_CARE_PROVIDER_SITE_OTHER): Payer: Self-pay | Admitting: Orthopaedic Surgery

## 2017-04-03 DIAGNOSIS — M791 Myalgia, unspecified site: Secondary | ICD-10-CM

## 2017-04-09 DIAGNOSIS — Z23 Encounter for immunization: Secondary | ICD-10-CM | POA: Diagnosis not present

## 2017-05-15 ENCOUNTER — Ambulatory Visit: Payer: Self-pay | Admitting: Neurology

## 2017-07-04 ENCOUNTER — Ambulatory Visit (INDEPENDENT_AMBULATORY_CARE_PROVIDER_SITE_OTHER): Payer: Medicare Other | Admitting: Internal Medicine

## 2017-07-04 ENCOUNTER — Other Ambulatory Visit: Payer: Self-pay

## 2017-07-04 ENCOUNTER — Encounter: Payer: Self-pay | Admitting: Student

## 2017-07-04 VITALS — BP 130/70 | HR 77 | Temp 98.4°F | Ht 64.0 in | Wt 227.2 lb

## 2017-07-04 DIAGNOSIS — R519 Headache, unspecified: Secondary | ICD-10-CM

## 2017-07-04 DIAGNOSIS — J0100 Acute maxillary sinusitis, unspecified: Secondary | ICD-10-CM | POA: Diagnosis not present

## 2017-07-04 DIAGNOSIS — R51 Headache: Secondary | ICD-10-CM | POA: Diagnosis not present

## 2017-07-04 MED ORDER — FLUTICASONE PROPIONATE 50 MCG/ACT NA SUSP: 1.0000 | Freq: Every day | NASAL | 5 refills | Status: DC

## 2017-07-04 MED ORDER — CETIRIZINE HCL 10 MG PO TABS: 10.0000 mg | ORAL_TABLET | Freq: Every day | ORAL | 0 refills | Status: DC

## 2017-07-04 MED ORDER — KETOROLAC TROMETHAMINE 30 MG/ML IJ SOLN
30.0000 mg | Freq: Once | INTRAMUSCULAR | Status: AC
  Administered 2017-07-04: 30 mg via INTRAMUSCULAR

## 2017-07-04 NOTE — Progress Notes (Signed)
   [facility] Kerrin Mo, MD Phone: [phone]  Reason For Visit: Acute Sinusitis   #Patient present with acute sinusitis . States she has had 3 days of nasal congestion, on and off headache. Feels congested in maxillary and ethmoid sinus. Patient has used some sinus medicine which has helped with headache. She denies any fevers, chills. Denies having had sinusitis for greater than 10 days. Currently has a headaches. Has not used any antihistamines or nasal decongestions   Past Medical History Reviewed problem list.  Medications- reviewed and updated No additions to family history   Objective: BP 130/70   Pulse 77   Temp 98.4 F (36.9 C) (Axillary)   Ht 5\' 4"  (1.626 m)   Wt 227 lb 3.2 oz (103.1 kg)   SpO2 97%   BMI 39.00 kg/m  Gen: NAD, alert, cooperative with exam HEENT: Normal, tenderness to palpation over the maxillary sinuses     Neck: No masses palpated. No lymphadenopathy    Ears: Tympanic membranes intact, normal light reflex, no erythema, no bulging    Nose: nasal turbinates congested     Throat: moist mucus membranes, no erythema Cardio: regular rate and rhythm, S1S2 heard, no murmurs appreciated Pulm: clear to auscultation bilaterally, no wheezes, rhonchi or rales Skin: dry, intact, no rashes or lesions    Assessment/Plan: See problem based a/p  Sinus infection Acute sinusitis, viral infection. No concern for bacterial infection at this point  - Prescribed certizine and flonase  - explained to patient, antibiotics unlikely to be helpful for viral infection  - Toradol shot for sinus headache  - if no improvement in 3-4 days, may call in for Augmentin prescription or return for follow up

## 2017-07-04 NOTE — Patient Instructions (Signed)
I am sorry you are feeling so bad today.  Your sinus infection I want you to take Zyrtec and Flonase.  You can take up to 2 puffs of Flonase at night.  If you do not have improvement in 2-3 days you can either call the clinic or make another appointment to discuss next steps

## 2017-07-07 NOTE — Assessment & Plan Note (Signed)
Acute sinusitis, viral infection. No concern for bacterial infection at this point  - Prescribed certizine and flonase  - explained to patient, antibiotics unlikely to be helpful for viral infection  - Toradol shot for sinus headache  - if no improvement in 3-4 days, may call in for Augmentin prescription or return for follow up

## 2017-08-29 NOTE — Telephone Encounter (Signed)
Opened in error. Deseree Blount, CMA 

## 2017-09-24 ENCOUNTER — Other Ambulatory Visit: Payer: Self-pay

## 2017-09-24 ENCOUNTER — Encounter: Payer: Self-pay | Admitting: Internal Medicine

## 2017-09-24 ENCOUNTER — Ambulatory Visit (INDEPENDENT_AMBULATORY_CARE_PROVIDER_SITE_OTHER): Payer: Medicare Other | Admitting: Internal Medicine

## 2017-09-24 VITALS — BP 132/82 | HR 78 | Temp 99.0°F | Ht 64.0 in | Wt 225.4 lb

## 2017-09-24 DIAGNOSIS — J209 Acute bronchitis, unspecified: Secondary | ICD-10-CM | POA: Diagnosis not present

## 2017-09-24 DIAGNOSIS — J019 Acute sinusitis, unspecified: Secondary | ICD-10-CM | POA: Diagnosis present

## 2017-09-24 MED ORDER — DOXYCYCLINE HYCLATE 100 MG PO TABS: 100.0000 mg | ORAL_TABLET | Freq: Two times a day (BID) | ORAL | 0 refills | Status: DC

## 2017-09-24 MED ORDER — ALBUTEROL SULFATE HFA 108 (90 BASE) MCG/ACT IN AERS: 2.0000 | INHALATION_SPRAY | Freq: Four times a day (QID) | RESPIRATORY_TRACT | 0 refills | Status: DC | PRN

## 2017-09-24 MED ORDER — BENZONATATE 100 MG PO CAPS: 100.0000 mg | ORAL_CAPSULE | Freq: Three times a day (TID) | ORAL | 0 refills | Status: DC | PRN

## 2017-09-24 NOTE — Progress Notes (Signed)
Subjective:    Alyssa Montgomery - 57 y.o. female MRN [id number]  Date of birth: 28-May-1961  HPI  Alyssa Montgomery is here for cough and congestion. Started >1 week ago and symptoms acutely worsened the past 2-3 days. Reports subjective fevers for the past couple of days as well as worsening of her congestion, rhinorrhea. Cough is non-productive. Denies sore throat, ear pain, chest pain, and SOB. Has been taking Nyquil and Tylenol for her symptoms. 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   Additionally, patient brought up concerns that she would like to be tested for herpes although she has never had any skin lesions or break outs concerning for HSV. She thinks her husband may have it. Discussed with patient that many people are asymptomatic carriers of HSV and that blood testing typically causes undue stress and that I would not recommend testing if she is not symptomatic.   Also very fixated on her chronic back pain. Concerned that this may be something wrong with her kidneys or liver even though pain has been present for years and is unchanged. I have recommended following up with PCP Dr. Burr Medico to better address her variety of concerns outside of this acute illness.    -  reports that she has been smoking cigarettes.  She has a 9.00 pack-year smoking history. She has never used smokeless tobacco. - Review of Systems: Per HPI. - Past Medical History: Patient Active Problem List   Diagnosis Date Noted  . Anxiety 11/13/2016  . 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 Forgetfulness 09/06/2016  . Hypocalcemia 09/06/2016  . Claudication ([place]) 09/03/2016  . Palpitations 09/03/2016  . Other chest pain 08/10/2016  . Sinus infection 08/10/2016  . TIA (transient ischemic attack) 08/10/2016  . Weakness generalized 08/10/2016  . Near syncope 08/09/2016  . Bleeding from left ear 02/23/2016  . History of cervical dysplasia 01/18/2016  . Postoperative state 12/27/2015  . Diabetes ([place]) 04/17/2015   . Healthcare maintenance 04/17/2015  . Abscess 04/17/2015  . Tobacco use disorder 04/17/2015  . Right rotator cuff tendinitis 11/16/2014  . Neck pain on right side 11/16/2014  . Shoulder pain, right 11/03/2014  . Lower abdominal pain 08/05/2014  . Fibromyalgia 06/18/2014  . Preventative health care 01/21/2014  . Sciatic pain 10/15/2013  . Trigger point of neck 05/01/2013  . Goiter 02/28/2013  . Wheeze 12/08/2012  . Hematuria 06/19/2012  . Back pain 06/19/2012  . Tobacco abuse 01/18/2012  . FIBROIDS, UTERUS 03/09/2010  . DEPRESSION 12/26/2006  . GERD 12/26/2006   - Medications: reviewed and updated   Objective:   Physical Exam BP 132/82   Pulse 78   Temp 99 F (37.2 C) (Oral)   Ht 5\' 4"  (1.626 m)   Wt 225 lb 6.4 oz (102.2 kg)   SpO2 97%   BMI 38.69 kg/m  Gen: NAD, alert, cooperative with exam, well-appearing HEENT: NCAT, PERRL, clear conjunctiva, oropharynx erythematous without tonsillar hypertrophy or exudates, nasal turbinates edematous L >R, rhinorrhea present, supple neck, TMs normal bilaterally  CV: RRR, good S1/S2, no murmur Resp: diffuse wheezing throughout, no focal lung exam findings, good air movement throughout, comfortable WOB, dry cough throughout exam  Skin: no rashes, normal turgor  Neuro: no gross deficits.  Psych: very anxious, alert and oriented    Assessment & Plan:   1. Acute non-recurrent sinusitis, unspecified location Concern for acute bacterial sinusitis with history that sounds consistent with double sickening. Will treat with Doxycyline given Penicillin allergy. Continue with  other supportive care measures such as Flonase, Mucinex, etc. Return precautions discussed.  - doxycycline (VIBRA-TABS) 100 MG tablet; Take 1 tablet (100 mg total) by mouth 2 (two) times daily.  Dispense: 20 tablet; Refill: 0  2. Acute bronchitis, unspecified organism Lung exam concerning for viral bronchitis given diffuse wheezing. Will treat with Albuterol inhaler.  Patient has no known underlying lung disease but is a current smoker. Will also give Tessalon for cough. Reassuring that patient is not hypoxic and not in respiratory distress. Return precautions discussed.  - benzonatate (TESSALON) 100 MG capsule; Take 1 capsule (100 mg total) by mouth 3 (three) times daily as needed for cough.  Dispense: 30 capsule; Refill: 0 - albuterol (PROVENTIL HFA;VENTOLIN HFA) 108 (90 Base) MCG/ACT inhaler; Inhale 2 puffs into the lungs every 6 (six) hours as needed for wheezing or shortness of breath.  Dispense: 1 Inhaler; Refill: 0   Phill Myron, D.O. 09/24/2017, 11:46 AM PGY-3, [place]

## 2017-09-24 NOTE — Patient Instructions (Addendum)
I think you have a sinus infection and bronchitis. I have prescribed an antibiotic, Doxycycline, that you should take twice per day for ten days. I have prescribed Tessalon which is a cough medication. I have also prescribed an Albuterol inhaler to help open up your airways. You can continue to use the Flonase. Mucinex may also be a good option for you. Please return if you continue to have fevers, cough lasts longer than 4 weeks, or you have trouble breathing.    Please make a follow up appointment with your PCP, Dr. Burr Medico, to discuss your concerns with your back pain.

## 2017-10-14 DIAGNOSIS — Z970 Presence of artificial eye: Secondary | ICD-10-CM | POA: Diagnosis not present

## 2017-10-14 DIAGNOSIS — H40012 Open angle with borderline findings, low risk, left eye: Secondary | ICD-10-CM | POA: Diagnosis not present

## 2017-10-14 DIAGNOSIS — H2512 Age-related nuclear cataract, left eye: Secondary | ICD-10-CM | POA: Diagnosis not present

## 2017-10-14 DIAGNOSIS — H04122 Dry eye syndrome of left lacrimal gland: Secondary | ICD-10-CM | POA: Diagnosis not present

## 2017-10-18 ENCOUNTER — Ambulatory Visit (INDEPENDENT_AMBULATORY_CARE_PROVIDER_SITE_OTHER): Payer: Medicare Other | Admitting: Student in an Organized Health Care Education/Training Program

## 2017-10-18 ENCOUNTER — Other Ambulatory Visit: Payer: Self-pay

## 2017-10-18 ENCOUNTER — Encounter: Payer: Self-pay | Admitting: Student in an Organized Health Care Education/Training Program

## 2017-10-18 VITALS — BP 124/72 | HR 62 | Temp 98.2°F | Ht 64.0 in | Wt 224.0 lb

## 2017-10-18 DIAGNOSIS — F17200 Nicotine dependence, unspecified, uncomplicated: Secondary | ICD-10-CM

## 2017-10-18 DIAGNOSIS — Z1231 Encounter for screening mammogram for malignant neoplasm of breast: Secondary | ICD-10-CM

## 2017-10-18 DIAGNOSIS — M545 Low back pain, unspecified: Secondary | ICD-10-CM

## 2017-10-18 DIAGNOSIS — E119 Type 2 diabetes mellitus without complications: Secondary | ICD-10-CM

## 2017-10-18 DIAGNOSIS — Z0000 Encounter for general adult medical examination without abnormal findings: Secondary | ICD-10-CM

## 2017-10-18 DIAGNOSIS — Z1239 Encounter for other screening for malignant neoplasm of breast: Secondary | ICD-10-CM

## 2017-10-18 LAB — POCT URINALYSIS DIP (MANUAL ENTRY)
Bilirubin, UA: NEGATIVE
Glucose, UA: NEGATIVE mg/dL
Ketones, POC UA: NEGATIVE mg/dL
Leukocytes, UA: NEGATIVE
Nitrite, UA: NEGATIVE
Protein Ur, POC: NEGATIVE mg/dL
Spec Grav, UA: 1.02 (ref 1.010–1.025)
Urobilinogen, UA: 0.2 E.U./dL
pH, UA: 6 (ref 5.0–8.0)

## 2017-10-18 LAB — POCT UA - MICROSCOPIC ONLY

## 2017-10-18 LAB — POCT GLYCOSYLATED HEMOGLOBIN (HGB A1C): Hemoglobin A1C: 6.4

## 2017-10-18 MED ORDER — VARENICLINE TARTRATE 0.5 MG X 11 & 1 MG X 42 PO MISC: ORAL | 0 refills | Status: DC

## 2017-10-18 NOTE — Assessment & Plan Note (Addendum)
Reports she was unaware she had this diagnosis. A1c at goal 6.4. Diet and lifestyle controlled. Foot exam completed today.

## 2017-10-18 NOTE — Patient Instructions (Signed)
It was a pleasure seeing you today in our clinic. Here is the treatment plan we have discussed and agreed upon together:  Call [place] is available 24/7 toll-free at [phone]-NOW [phone]).Our clinic's number is [phone]. Please call with questions or concerns about what we discussed today.  Please schedule a visit with Dr. Valentina Lucks as you leave today to further discuss smoking cessation.  Please follow up with me in the office so we can continue to address your medical concerns.  Sign up for My Chart to have easy access to your labs results, and communication with your primary care physician.  Be well, Dr. Burr Medico

## 2017-10-18 NOTE — Assessment & Plan Note (Addendum)
-   varenicline (CHANTIX STARTING MONTH PAK) 0.5 MG X 11 & 1 MG X 42 tablet; Take one 0.5 mg tablet by mouth once daily for 3 days, then increase to one 0.5 mg tablet twice daily for 4 days, then increase to one 1 mg tablet twice daily.  Dispense: 53 tablet; Refill: 0 - Goals: starting by cutting out one mealtime cigarette  - refer to Dr. Valentina Lucks for further resources and cessation counseling - Discussed Quit [employer]

## 2017-10-18 NOTE — Progress Notes (Signed)
Subjective:    Alyssa Montgomery - 57 y.o. 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 Patient states she was unaware she had this diagnosis. A1c 6.4 from 6.6 on previous check >1 year ago. No vision changes, polydipsia or polyuria, or chronic wounds.  Health maintenance - patient asks for breast exam and mammogram referral today. States she does not do regular self breast exams and she has not noticed anything abnormal.  ROS see HPI Smoking Status noted.  Health Maintenance:  Health Maintenance Due  Topic Date Due  . FOOT EXAM  09/08/1969  . OPHTHALMOLOGY EXAM  09/08/1969  . URINE MICROALBUMIN  09/08/1969  . MAMMOGRAM  09/13/2016   Review of Systems See HPI     Objective:   Physical Exam BP 124/72   Pulse 62   Temp 98.2 F (36.8 C) (Oral)   Ht 5\' 4"  (1.626 m)   Wt 224 lb (101.6 kg)   SpO2 99%   BMI 38.45 kg/m  Gen: NAD, alert, cooperative with exam, well-appearing HEENT: NCAT, PERRL, clear conjunctiva, oropharynx clear, supple neck CV: RRR, good S1/S2, no murmur, no edema, capillary refill brisk  Resp: CTABL, no wheezes, non-labored Abd: SNTND, BS present, no guarding or organomegaly Skin: no rashes, normal turgor  Neuro: no gross deficits.  Psych: good insight, alert and oriented     Assessment &  Plan:   Tobacco use disorder - varenicline (CHANTIX STARTING MONTH PAK) 0.5 MG X 11 & 1 MG X 42 tablet; Take one 0.5 mg tablet by mouth once daily for 3 days, then increase to one 0.5 mg tablet twice daily for 4 days, then increase to one 1 mg tablet twice daily.  Dispense: 53 tablet; Refill: 0 - Goals: starting by cutting out one mealtime cigarette  - refer to Dr. Valentina Lucks for further resources and cessation counseling - Discussed Quit Line  Diabetes [facility]) Reports she was unaware she had this diagnosis. A1c at goal 6.4. Diet and lifestyle controlled. Foot exam completed today.  Preventative health care Patient is interested in health screening and "checking all her organs." Taking care of her mother has made her recently health conscious. - MS DIGITAL SCREENING BILATERAL; Future - Lipid panel - screening CMP for liver function, renal function, electrolytes - patient to follow up to continue addressing health maintenance   **UA and dipstick were ordered at this visit by CMA because patient had a complaint about back pain, however this was not addressed in the visit. They were cosigned to another provider and I was unaware they were sent until they had resulted making it difficult to cancel the labs. Will reach out to lab and see if these can be cancelled retroactively.  Orders Placed This Encounter  Procedures  .  MS DIGITAL SCREENING BILATERAL    Standing Status:   Future    Standing Expiration Date:   01/19/2019    Order Specific Question:   Reason for Exam (SYMPTOM  OR DIAGNOSIS REQUIRED)    Answer:   cancer screening    Order Specific Question:   Is the patient pregnant?    Answer:   No    Order Specific Question:   Preferred imaging location?    Answer:   [facility] [place]  . Comprehensive metabolic panel    Order Specific Question:   Has the patient fasted?    Answer:   No  . Lipid panel    Order Specific Question:   Has the patient fasted?    Answer:   No  . POCT urinalysis  dipstick  . POCT UA - Microscopic Only  . HgB A1c    Meds ordered this encounter  Medications  . varenicline (CHANTIX STARTING MONTH PAK) 0.5 MG X 11 & 1 MG X 42 tablet    Sig: Take one 0.5 mg tablet by mouth once daily for 3 days, then increase to one 0.5 mg tablet twice daily for 4 days, then increase to one 1 mg tablet twice daily.    Dispense:  53 tablet    Refill:  0    Alyssa Coombe, MD,MS,  PGY2 10/21/2017 10:39 AM

## 2017-10-19 LAB — COMPREHENSIVE METABOLIC PANEL
ALT: 19 IU/L (ref 0–32)
AST: 18 IU/L (ref 0–40)
Albumin/Globulin Ratio: 1.8 (ref 1.2–2.2)
Albumin: 4.4 g/dL (ref 3.5–5.5)
Alkaline Phosphatase: 118 IU/L — ABNORMAL HIGH (ref 39–117)
BUN/Creatinine Ratio: 15 (ref 9–23)
BUN: 11 mg/dL (ref 6–24)
Bilirubin Total: <0.2 mg/dL (ref 0.0–1.2)
CO2: 20 mmol/L (ref 20–29)
Calcium: 9.4 mg/dL (ref 8.7–10.2)
Chloride: 104 mmol/L (ref 96–106)
Creatinine, Ser: 0.71 mg/dL (ref 0.57–1.00)
GFR calc Af Amer: 109 mL/min/{1.73_m2} (ref >59)
GFR calc non Af Amer: 94 mL/min/{1.73_m2} (ref >59)
Globulin, Total: 2.4 g/dL (ref 1.5–4.5)
Glucose: 84 mg/dL (ref 65–99)
Potassium: 4 mmol/L (ref 3.5–5.2)
Sodium: 143 mmol/L (ref 134–144)
Total Protein: 6.8 g/dL (ref 6.0–8.5)

## 2017-10-19 LAB — LIPID PANEL
Chol/HDL Ratio: 3.5 ratio (ref 0.0–4.4)
Cholesterol, Total: 219 mg/dL — ABNORMAL HIGH (ref 100–199)
HDL: 63 mg/dL (ref >39)
LDL Calculated: 125 mg/dL — ABNORMAL HIGH (ref 0–99)
Triglycerides: 155 mg/dL — ABNORMAL HIGH (ref 0–149)
VLDL Cholesterol Cal: 31 mg/dL (ref 5–40)

## 2017-10-21 ENCOUNTER — Ambulatory Visit: Payer: Self-pay | Admitting: Pharmacist

## 2017-10-21 NOTE — Assessment & Plan Note (Signed)
Patient is interested in health screening and "checking all her organs." Taking care of her mother has made her recently health conscious. - MS DIGITAL SCREENING BILATERAL; Future - Lipid panel - screening CMP for liver function, renal function, electrolytes - patient to follow up to continue addressing health maintenance

## 2017-10-24 ENCOUNTER — Ambulatory Visit: Payer: Medicare Other | Admitting: Pharmacist

## 2017-10-24 ENCOUNTER — Ambulatory Visit (INDEPENDENT_AMBULATORY_CARE_PROVIDER_SITE_OTHER): Payer: Medicare Other

## 2017-10-24 ENCOUNTER — Other Ambulatory Visit: Payer: Self-pay

## 2017-10-24 ENCOUNTER — Ambulatory Visit (HOSPITAL_COMMUNITY)
Admission: EM | Admit: 2017-10-24 | Discharge: 2017-10-24 | Disposition: A | Discharge status: Home / Self Care | Payer: Medicare Other | Attending: Family Medicine | Admitting: Family Medicine

## 2017-10-24 DIAGNOSIS — M25561 Pain in right knee: Secondary | ICD-10-CM

## 2017-10-24 DIAGNOSIS — M25511 Pain in right shoulder: Secondary | ICD-10-CM

## 2017-10-24 DIAGNOSIS — S8991XA Unspecified injury of right lower leg, initial encounter: Secondary | ICD-10-CM | POA: Diagnosis not present

## 2017-10-24 DIAGNOSIS — W19XXXA Unspecified fall, initial encounter: Secondary | ICD-10-CM | POA: Diagnosis not present

## 2017-10-24 DIAGNOSIS — S4991XA Unspecified injury of right shoulder and upper arm, initial encounter: Secondary | ICD-10-CM | POA: Diagnosis not present

## 2017-10-24 DIAGNOSIS — M62838 Other muscle spasm: Secondary | ICD-10-CM | POA: Diagnosis not present

## 2017-10-24 MED ORDER — KETOROLAC TROMETHAMINE 30 MG/ML IJ SOLN
INTRAMUSCULAR | Status: AC
  Filled 2017-10-24: qty 1

## 2017-10-24 MED ORDER — KETOROLAC TROMETHAMINE 30 MG/ML IJ SOLN
30.0000 mg | Freq: Once | INTRAMUSCULAR | Status: AC
  Administered 2017-10-24: 30 mg via INTRAMUSCULAR

## 2017-10-24 MED ORDER — CYCLOBENZAPRINE HCL 5 MG PO TABS: 5.0000 mg | ORAL_TABLET | Freq: Every evening | ORAL | 0 refills | Status: DC | PRN

## 2017-10-24 MED ORDER — NAPROXEN 375 MG PO TABS: 375.0000 mg | ORAL_TABLET | Freq: Two times a day (BID) | ORAL | 0 refills | Status: DC

## 2017-10-24 NOTE — ED Provider Notes (Signed)
[facility]   [id number] 10/24/17 Arrival Time: 5643  SUBJECTIVE: History from: patient. FYNLEE ROWLANDS is a 57 y.o. female complains of right shoulder and knee pain that began last night.  Began after dog, weighting approximately 112 lbs, ran into her leg and knocked her over while opening the door to her house.  Shoulder pain is diffuse.  Localizes the knee pain to the inside of knee.  Describes the pain as constant and achy in character.  Has NOT tried OTC medications without relief.  Symptoms are made worse with reaching and overhead activities, as well as weight-bearing.  Denies similar symptoms in the past.  Complains of swelling.  Denies fever, chills, LOC, erythema, ecchymosis, weakness, numbness and tingling.      Patient also reports hitting head on floor after being in a seated position on the floor, following the fall.  Denies LOC or bleeding.    ROS: As per HPI.  Past Medical History:  Diagnosis Date  . Anxiety   . Bronchitis   . Diverticulitis   . Fibromyalgia   . GERD (gastroesophageal reflux disease)   . Headache   . Substance abuse ([place])    10 years ago ( cocaine)   Past Surgical History:  Procedure Laterality Date  . ANKLE SURGERY    . BACK SURGERY    . Bowel obstruction    . CERVICAL CONIZATION W/BX N/A 12/27/2015   Procedure: CONIZATION CERVIX WITH BIOPSY;  Surgeon: Terrance Mass, MD;  Location: [facility];  Service: Gynecology;  Laterality: N/A;  . DILATATION & CURETTAGE/HYSTEROSCOPY WITH MYOSURE N/A 12/27/2015   Procedure: DILATATION & CURETTAGE/HYSTEROSCOPY WITH MYOSURE;  Surgeon: Terrance Mass, MD;  Location: [facility];  Service: Gynecology;  Laterality: N/A;  . ECTOPIC PREGNANCY SURGERY    . EYE SURGERY     removed right eye;    Allergies  Allergen Reactions  . Zanaflex [Tizanidine Hcl] Other (See Comments)    Syncope  . Penicillins Other (See Comments)    Unknown childhood reaction.  . Sulfonamide Derivatives Itching   No current  facility-administered medications on file prior to encounter.    Current Outpatient Medications on File Prior to Encounter  Medication Sig Dispense Refill  . acetaminophen (TYLENOL) 325 MG tablet Take 650-975 mg by mouth 2 (two) times daily as needed for mild pain.    Marland Kitchen albuterol (PROVENTIL HFA;VENTOLIN HFA) 108 (90 Base) MCG/ACT inhaler Inhale 2 puffs into the lungs every 6 (six) hours as needed for wheezing or shortness of breath. 1 Inhaler 0  . benzonatate (TESSALON) 100 MG capsule Take 1 capsule (100 mg total) by mouth 3 (three) times daily as needed for cough. 30 capsule 0  . doxycycline (VIBRA-TABS) 100 MG tablet Take 1 tablet (100 mg total) by mouth 2 (two) times daily. 20 tablet 0  . Artificial Tear Solution (SOOTHE HYDRATION OP) Place 1 Dose into the left eye 2 (two) times daily. Reported on 12/26/2015    . bismuth subsalicylate (PEPTO BISMOL) 262 MG/15ML suspension Take 30 mLs by mouth every 6 (six) hours as needed for indigestion.    . Ca Carbonate-Mag Hydroxide (ROLAIDS PO) Take 1-2 tablets by mouth 4 (four) times daily as needed (For indigestion.). Reported on 12/26/2015    . cetirizine (ZYRTEC) 10 MG tablet Take 1 tablet (10 mg total) by mouth daily. 30 tablet 0  . Cholecalciferol (D3-1000) 1000 units tablet Take 1,000 Units by mouth daily. Reported on 12/26/2015    . Coenzyme Q10 (COQ-10) 200  MG CAPS Take 1 capsule by mouth daily. Reported on 12/26/2015    . fluticasone (FLONASE) 50 MCG/ACT nasal spray Place 1 spray into both nostrils daily. 1 spray in each nostril every day 16 g 5  . HYDROcodone-homatropine (HYCODAN) 5-1.5 MG/5ML syrup Take 5 mLs by mouth every 6 (six) hours as needed for cough. 120 mL 0  . Menthol-Methyl Salicylate (MUSCLE RUB) 10-15 % CREA Apply 1 application topically as needed for muscle pain. Reported on 12/26/2015    . methylPREDNISolone (MEDROL DOSEPAK) 4 MG TBPK tablet Take 6-5-4-3-2-1 po qd 21 tablet 0  . Multiple Vitamin (MULTIVITAMIN WITH MINERALS) TABS tablet  Take 1 tablet by mouth daily. Reported on 12/26/2015    . ofloxacin (OCUFLOX) 0.3 % ophthalmic solution Place 2 drops into the left eye daily. Reported on 12/26/2015    . predniSONE (DELTASONE) 50 MG tablet Take 1 tablet (50 mg total) by mouth daily with breakfast. 5 tablet 0  . promethazine (PHENERGAN) 25 MG tablet Take 25 mg by mouth every 6 (six) hours as needed for nausea or vomiting. Reported on 12/26/2015    . simethicone (MYLICON) 086 MG chewable tablet Chew 125 mg by mouth every 6 (six) hours as needed for flatulence. Reported on 12/26/2015    . varenicline (CHANTIX STARTING MONTH PAK) 0.5 MG X 11 & 1 MG X 42 tablet Take one 0.5 mg tablet by mouth once daily for 3 days, then increase to one 0.5 mg tablet twice daily for 4 days, then increase to one 1 mg tablet twice daily. 53 tablet 0  . VOLTAREN 1 % GEL APPLY 2 G TOPICALLY DAILY AS NEEDED FOR MUSCLE PAIN 1 Tube 2   Social History   Socioeconomic History  . Marital status: Married    Spouse name: Not on file  . Number of children: Not on file  . Years of education: Not on file  . Highest education level: Not on file  Occupational History  . Not on file  Social Needs  . Financial resource strain: Not on file  . Food insecurity:    Worry: Not on file    Inability: Not on file  . Transportation needs:    Medical: Not on file    Non-medical: Not on file  Tobacco Use  . Smoking status: Current Every Day Smoker    Packs/day: 0.50    Years: 18.00    Pack years: 9.00    Types: Cigarettes  . Smokeless tobacco: Never Used  . Tobacco comment: 1-800 number given   Substance and Sexual Activity  . Alcohol use: Yes    Alcohol/week: 0.6 oz    Types: 1 Glasses of wine per week    Comment: OCCASIONAL  . 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 Sexual activity: Yes    Partners: Male    Birth control/protection: Post-menopausal  Lifestyle  . Physical activity:    Days per week: Not on file    Minutes per session: Not  on file  . Stress: Not on file  Relationships  . Social connections:    Talks on phone: Not on file    Gets together: Not on file    Attends religious service: Not on file    Active member of club or organization: Not on file    Attends meetings of clubs or organizations: Not on file    Relationship status: Not on file  . Intimate partner violence:    Fear of  current or ex partner: Not on file    Emotionally abused: Not on file    Physically abused: Not on file    Forced sexual activity: Not on file  Other Topics Concern  . Not on file  Social History Narrative  . Not on file   Family History  Problem Relation Age of Onset  . Cancer Father        lung  . Hypertension Mother   . Arthritis Mother   . Other Neg Hx        pituitary problem    OBJECTIVE:  Vitals:   10/24/17 1511  BP: 137/70  Pulse: 77  Temp: 98.6 F (37 C)  TempSrc: Oral    General appearance: AOx3; in no acute distress.  Head: NCAT.  Mildly tender to palpation about the parietal bone.  No skull depressions or obvious fractures.  No dried blood or active bleeding Lungs: CTA bilaterally Heart: RRR.  Clear S1 and S2 without murmur, gallops, or rubs.  Radial pulses 2+ bilaterally. Musculoskeletal: Right shoulder  Inspection: Skin warm, dry, clear and intact without obvious erythema,  effusion, or ecchymosis.  Palpation: Tender to palpation about anterior shoulder and trapezius ROM: FROM passive, 0-90 with active Strength: 5/5 shld abduction, 5/5 shld adduction, 5/5 elbow flexion, 5/5 elbow extension, 5/5 grip strength Sensation intact Right knee:  Inspection: Skin clear and intact without obvious erythema, effusion, or  ecchymosis.  Skin warm and dry to the touch  Palpation: tender about the MJL. Tender with varus and valgus stress  ROM: FROM active and passive  Strength: 5/5 hip flexion, 5/5 knee abduction, 5/5 knee adduction, 5/5  knee flexion, 5/5 knee extension, 5/5 dorsiflexion  Sensation  intact Skin: warm and dry Neurologic: Antalgic gait Psychological: alert and cooperative; normal mood and affect  DIAGNOSTIC   Imaging Orders     DG Shoulder Right     DG Knee Complete 4 Views Right CLINICAL DATA: Fall. Pain.  EXAM: RIGHT SHOULDER - 2+ VIEW  COMPARISON: MRI 12/29/2014.  FINDINGS: Acromioclavicular and glenohumeral degenerative change. No acute bony abnormality identified. No evidence of fracture or dislocation.  IMPRESSION: No acute abnormality identified. No evidence of fracture or dislocation.   Electronically Signed By: Marcello Moores Register On: 10/24/2017 16:40  CLINICAL DATA: Pain after trauma  EXAM: RIGHT KNEE - COMPLETE 4+ VIEW  COMPARISON: None.  FINDINGS: No effusion. Mild irregularity along the medial femoral epicondyle on AP imaging. No other evidence of fracture. Mild degenerative changes in the medial compartment. No joint effusion. No other abnormalities.  IMPRESSION: Mild irregularity along the distal right medial epicondyle. I suspect this is nonacute, possibly degenerative or enthesopathic. Recommend clinical correlation in this region. No other abnormalities.   Electronically Signed By: Dorise Bullion III M.D On: 10/24/2017 16:42 ASSESSMENT & PLAN:  1. Acute pain of right knee   2. Acute pain of right shoulder   3. Fall, initial encounter   4. Muscle spasm   5. Medial joint line tenderness of knee, right     @NIMG @  Meds ordered this encounter  Medications  . ketorolac (TORADOL) 30 MG/ML injection 30 mg  . naproxen (NAPROSYN) 375 MG tablet    Sig: Take 1 tablet (375 mg total) by mouth 2 (two) times daily.    Dispense:  20 tablet    Refill:  0    Order Specific Question:   Supervising Provider    Answer:   Wynona Luna [id number]  . cyclobenzaprine (FLEXERIL) 5 MG tablet  Sig: Take 1 tablet (5 mg total) by mouth at bedtime as needed for muscle spasms.    Dispense:  12 tablet    Refill:  0     Order Specific Question:   Supervising Provider    Answer:   Wynona Luna [[id number]]    Ortho information given.  Recommend further evaluation and management with ortho if symptoms persists.  Will call tomorrow to schedule appointment Knee brace given Instructed to use crutches at home Continue conservative management of rest, ice, elevate, compress, and gentle stretches Take naproxen as needed for pain relief (may cause abdominal discomfort, ulcers, and GI bleeds avoid taking with other NSAIDs) Take flexeril at nighttime as needed for spasm Return or go to ER if you have any new or worsening symptoms (fever, chills, chest pain, abdominal pain, changes in bowel or bladder habits, pain radiating into lower legs, etc...)   Reviewed expectations re: course of current medical issues. Questions answered. Outlined signs and symptoms indicating need for more acute intervention. Patient verbalized understanding. After Visit Summary given.    Lestine Box, PA-C 10/24/17 1756

## 2017-10-24 NOTE — Discharge Instructions (Addendum)
Ortho information given.  Recommend further evaluation and management with ortho if symptoms persists Knee brace given Use crutches at home Continue conservative management of rest, ice, elevate, compress, and gentle stretches Take naproxen as needed for pain relief (may cause abdominal discomfort, ulcers, and GI bleeds avoid taking with other NSAIDs) Take flexeril at nighttime as needed for spasm Return or go to ER if you have any new or worsening symptoms (fever, chills, chest pain, abdominal pain, changes in bowel or bladder habits, pain radiating into lower legs, etc...)

## 2017-10-24 NOTE — ED Notes (Signed)
Copy of [facility] paperwork copied to be scanned into medical record

## 2017-10-24 NOTE — ED Notes (Signed)
Patient transported to X-ray Not available for medication injection

## 2017-10-24 NOTE — ED Triage Notes (Signed)
Pt states her dog ran into the medial side of her right knee and knocked her over and she fell.  Pt states she is stiff and sore everywhere, but the medial side of her knee hurts the most and she is most concerned about that.

## 2017-10-25 ENCOUNTER — Ambulatory Visit: Payer: Self-pay | Admitting: Internal Medicine

## 2017-10-31 ENCOUNTER — Ambulatory Visit: Payer: Self-pay | Admitting: Pharmacist

## 2017-11-08 ENCOUNTER — Ambulatory Visit: Payer: Medicare Other | Admitting: Internal Medicine

## 2017-11-08 ENCOUNTER — Ambulatory Visit: Payer: Self-pay | Admitting: Student in an Organized Health Care Education/Training Program

## 2017-11-08 ENCOUNTER — Telehealth: Payer: Self-pay | Admitting: Student in an Organized Health Care Education/Training Program

## 2017-11-08 NOTE — Telephone Encounter (Signed)
[phone] 5 and at night.  She would like her blood test results from may 10 visit.

## 2017-11-10 NOTE — Telephone Encounter (Signed)
Covering inbox for Dr. Burr Medico. ASCVD 10 yr risk 16.9%, patient would benefit from high intensity statin. Will call patient to discuss.

## 2017-11-13 NOTE — Progress Notes (Signed)
Subjective:    Patient ID: Alyssa Montgomery , female   DOB: 03-18-1961 , 57 y.o..   MRN: [id number]  HPI  Alyssa Montgomery is a 57 year old female past medical history of diabetes, tobacco abuse, depression, TIA here for  Chief Complaint  Patient presents with  . lab results  . Ear Fullness    1. HYPERLIPIDEMIA: Her labs checked on May 10.  Her cholesterol was high at 219, LDL 125, triglycerides 155.  HDL normal.  She notes that she has had high cholesterol in the past.  She has never been on any medications for this.  2. Ear fullness: Patient notes that for the last couple weeks she has felt ear fullness of both of her ears.  She notes that she had a cold and has had a runny nose.  Denies any ear drainage or fever at this time.  Review of Systems: Per HPI.   Past Medical History: Patient Active Problem List   Diagnosis Date Noted  . Hyperlipidemia 11/14/2017  . Anxiety 11/13/2016  . 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 Claudication ([place]) 09/03/2016  . TIA (transient ischemic attack) 08/10/2016  . History of cervical dysplasia 01/18/2016  . Diabetes ([place]) 04/17/2015  . Healthcare maintenance 04/17/2015  . Tobacco use disorder 04/17/2015  . Fibromyalgia 06/18/2014  . Preventative health care 01/21/2014  . Goiter 02/28/2013  . Tobacco abuse 01/18/2012  . FIBROIDS, UTERUS 03/09/2010  . DEPRESSION 12/26/2006  . GERD 12/26/2006    Medications: reviewed   Social Hx:  reports that she has been smoking cigarettes.  She has a 9.00 pack-year smoking history. She has never used smokeless tobacco.   Objective:   BP 120/70 (BP Location: Left Arm, Patient Position: Sitting, Cuff Size: Normal)   Pulse 74   Temp 97.9 F (36.6 C) (Oral)   Ht 5\' 4"  (1.626 m)   Wt 222 lb 12.8 oz (101.1 kg)   SpO2 95%   BMI 38.24 kg/m  Physical Exam  Gen: NAD, alert, cooperative with exam, well-appearing HEENT:     Head: Normocephalic, atraumatic    Neck: No masses palpated. No goiter. No  lymphadenopathy     Ears: External ears normal, no drainage.Tympanic membranes intact with fluid seen behind bilateral tympanic membranes, non-erythematous    Eyes: PERRLA, EOMI, sclera white, normal conjunctiva    Nose: nasal turbinates moist congested, no nasal discharge    Throat: moist mucus membranes, no pharyngeal erythema, no tonsillar exudate. Airway is patent Psych: good insight, tearful at times  Assessment & Plan:   1. Hyperlipidemia: ASCVD 21.1% Cholesterol 219, LDL elevated 125, Triglycerides 155 - atorvastatin (LIPITOR) 40 MG tablet; Take 1 tablet (40 mg total) by mouth daily.  Dispense: 90 tablet; Refill: 3  -Advised decreasing fried fatty foods and decreasing carbohydrates and sugars from diet -Can recheck lipid panel in 6 months  2. Bilateral acute serous otitis media, recurrence not specified - fluticasone (FLONASE) 50 MCG/ACT nasal spray; Place 1 spray into both nostrils daily. 1 spray in each nostril every day  Dispense: 16 g; Refill: 5 -Return precautions discussed, if no improvement over the next couple weeks may consider referral to ENT  3. Breast cancer screening: Patient has not received a call to schedule her mammogram another order was placed - MM Digital Screening; Future  4. Muscle pain: Not discussed thoroughly at visit today but she did request a refill for her Voltaren - VOLTAREN 1 % GEL; APPLY 2 G TOPICALLY DAILY AS NEEDED FOR MUSCLE  PAIN  Dispense: 1 Tube; Refill: 2  Of note patient became tearful towards the end of the visit and mentioned how she has been under increased stress and has some intermittent depression.  No SI. 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  Patient was seen by our clinical social worker Neoma Laming today for this.    Smitty Cords, MD [place], PGY-3

## 2017-11-14 ENCOUNTER — Telehealth: Payer: Self-pay | Admitting: Pharmacist

## 2017-11-14 ENCOUNTER — Encounter: Payer: Self-pay | Admitting: Licensed Clinical Social Worker

## 2017-11-14 ENCOUNTER — Encounter: Payer: Self-pay | Admitting: Family Medicine

## 2017-11-14 ENCOUNTER — Ambulatory Visit: Payer: Medicare Other | Admitting: Pharmacist

## 2017-11-14 ENCOUNTER — Encounter: Payer: Self-pay | Admitting: Pharmacist

## 2017-11-14 ENCOUNTER — Ambulatory Visit (INDEPENDENT_AMBULATORY_CARE_PROVIDER_SITE_OTHER): Payer: Medicare Other | Admitting: Family Medicine

## 2017-11-14 VITALS — BP 120/70 | HR 74 | Temp 97.9°F | Ht 64.0 in | Wt 222.8 lb

## 2017-11-14 DIAGNOSIS — F17200 Nicotine dependence, unspecified, uncomplicated: Secondary | ICD-10-CM

## 2017-11-14 DIAGNOSIS — Z1239 Encounter for other screening for malignant neoplasm of breast: Secondary | ICD-10-CM

## 2017-11-14 DIAGNOSIS — H6503 Acute serous otitis media, bilateral: Secondary | ICD-10-CM

## 2017-11-14 DIAGNOSIS — E785 Hyperlipidemia, unspecified: Secondary | ICD-10-CM | POA: Diagnosis present

## 2017-11-14 DIAGNOSIS — M791 Myalgia, unspecified site: Secondary | ICD-10-CM

## 2017-11-14 DIAGNOSIS — Z1231 Encounter for screening mammogram for malignant neoplasm of breast: Secondary | ICD-10-CM | POA: Diagnosis not present

## 2017-11-14 MED ORDER — VOLTAREN 1 % TD GEL: TRANSDERMAL | 2 refills | Status: DC

## 2017-11-14 MED ORDER — FLUTICASONE PROPIONATE 50 MCG/ACT NA SUSP: 1.0000 | Freq: Every day | NASAL | 5 refills | Status: DC

## 2017-11-14 MED ORDER — ATORVASTATIN CALCIUM 40 MG PO TABS: 40.0000 mg | ORAL_TABLET | Freq: Every day | ORAL | 3 refills | Status: DC

## 2017-11-14 NOTE — Telephone Encounter (Signed)
PA for Chantix approved. [place] and asked them to run it, they report it went through and will cost $3.80.   Carlean Jews, Pharm.D., BCPS PGY2 Ambulatory Care Pharmacy Resident Phone: [phone]

## 2017-11-14 NOTE — Telephone Encounter (Signed)
Called patient's pharmacy to verify Chantix prescription. Primary insurance requires prior authorization and they can't bill to medicaid until primary coverage is billed. Discovered patient has [employer] Medicare PBM. BIN [id number] PCN [employer] [id number]. Submitted PA via Cover my meds 11/14/2017, Key = [id number], [place] ID# [id number]. Will f/u in 1-3 days.   Carlean Jews, Pharm.D., BCPS PGY2 Ambulatory Care Pharmacy Resident Phone: [phone]

## 2017-11-14 NOTE — Telephone Encounter (Signed)
Letter sent to patient home.

## 2017-11-14 NOTE — Progress Notes (Signed)
   S:  Patient arrives in good spirits, ambulating without assistnace.   Patient arrives for evaluation/assistance with tobacco dependence.  Patient was referred by Dr. Burr Medico on 10/18/17.  Last PCP visit 10/18/17 - at that time Chantix was started however patient has not picked up yet as she never received communication from pharmacy (her phone broke). Spoke with pharmacy, PA was required. PA completed 11/14/2017 and medication will now be available to patient at $3.80.   Age when started using tobacco on a daily basis 8. Number of cigarettes/day 19. Estimated nicotine content per cigarette 1 mg.  Estimated nicotine intake per day 20mg .   Denies waking to smoke but smokes most often after work, immediately prior to bed.  Reports increased smoking of late as she cares for her elderly mother and she has had recent stress in her marriage.   Fagerstrom Score Question Scoring Patient Score  How soon after waking do you smoke your first cigarette? <5 mins (3) 5-30 mins (2) 31-60 mins (1) >60 mins (0) 3  Do you find it difficult NOT to smoke in places where you shouldn't? Yes(1) No (0) 1  Which cigarette would you most hate to give up? First one in AM (1)  Any other one (0) 0     How many cigarettes do you smoke/day? 10 or less (0) 11-20 (1) 21-30 (2) >30 (3) 2  Do you smoke more during the first few hours after waking? Yes (1) No (0) 0  Do you smoke if you are so ill you cannot get out of bed? Yes (1) No (0) 0   Total Score 6  Score interpretation: low 1-2, low-to-moderate 3-4, moderate 5-7, high >7  Longest time ever been tobacco free 1 year - quit cold [place] but was using marijuana instead. No longer using illicit substances, reports "17 years clean!"  Medications used in past cessation efforts include: NRT (inhaler, lozenge)  Rates CONFIDENCE of quitting tobacco on 1-10 scale of 5.  Most common triggers to use tobacco include; stress, relaxation late at night, driving, after  eating  Motivation to quit: health, husband (husband quit smoking when he became an Education administrator)   A/P: Tobacco use disorder with moderate nicotine dependence of 49 years duration in a patient who is good candidate for success because of motivation to quit and support system.    -Initiated varenicline titration of 0.5 mg by mouth once daily with food x3 days, then 0.5 mg by mouth twice daily with food x4 days, then 1 mg by mouth twice daily with food thereafter. Patient counseled on purpose, proper use, and potential adverse effects, including GI upset, and potential change in mood.  -Set quit plan, reduce cigarettes by 5/week beginning Monday 11/18/17. Goal is to be tobacco free by 12/12/17.  -Provided information on 1 800-QUIT NOW support program.  F/U phone call 2 weeks.  F/U Rx Clinic Visit  To be determined at that time.   Total time in face-to-face counseling 20 minutes.  Patient seen with Deirdre Pippins, PharmD, BCPS, PGY2 Pharmacy Resident.

## 2017-11-14 NOTE — Progress Notes (Signed)
Type of Service: Integrated Behavioral Health Warm Handoff  Alyssa Montgomery is a 57 y.o. female referred by Dr. Juanito Doom for assistance with managing stress.  Patient is pleasant and engaged in conversation. Reports :concerns with  Marriage conflict and caregiver stress.  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 Recent llfe changes: marriage conflict Issues discussed:  Integrated care services, support system, previous and current coping skills, community resources( care giver support group and ongoing therapy) , things patient enjoy or use to enjoy doing.    Intervention: supportive counseling, solutions focus strategies, community resources, emotional support, relaxed breathing, Referral to Counselor/Psychotherapist ;  Assessment/Plan: Patient is currently experiencing symptoms of stress which are exacerbated by Marriage conflict and caregiver stress. Patient also admits that she has history of trauma and needs ongoing therapy to deal with some of these things. Patient may benefit from, and is in agreement to receive further assessment and ongoing therapeutic interventions to assist with managing her stress.  List of providers given.  Patient will make calls.  She will also implement relaxed breathing as needed and F/U on caregiver support group.    Warm Hand Off Completed.      Casimer Lanius, LCSW Licensed Clinical Social Worker [place]   [phone] 10:26 AM

## 2017-11-14 NOTE — Patient Instructions (Addendum)
Good to meet you!  1. Start chantix  0.5 mg by mouth once daily with food x3 days, then 0.5 mg by mouth twice daily with food x4 days, then 1 mg by mouth twice daily with food thereafter.  2. Reduce cigarettes by 5/week. Goal is to be quit by fourth of July  We will call you in 2 weeks to check on you!

## 2017-11-14 NOTE — Patient Instructions (Signed)
Thank you for coming in today, it was so nice to see you! Today we talked about:    High Cholesterol: Your cholesterol is high.  We need to prevent strokes and heart attacks, we are starting you on a cholesterol-lowering medicine.  Take 1 pill every day.  If you begin to get worsening muscle cramps and stop the medicine.  Need to keep well-hydrated and drink enough water to your urine is clear to light yellow  Ear fullness; your ears are full because you have congestion.  I do recommend using Flonase twice a day and this will help.  If you continue to have symptoms after a couple weeks please come back and we may need to refer you to ear nose and throat specialist  Prediabetes; your A1c was 6.4 please decrease sugars and carbohydrates from your diet  Please follow up in 3 months.    If you have any questions or concerns, please do not hesitate to call the office at [phone]. You can also message me directly via MyChart.   Sincerely,  Smitty Cords, MD

## 2017-11-14 NOTE — Addendum Note (Signed)
Addended by: Leavy Cella on: 11/14/2017 02:45 PM   Modules accepted: Level of Service

## 2017-11-14 NOTE — Assessment & Plan Note (Signed)
Tobacco use disorder with moderate nicotine dependence of 49 years duration in a patient who is good candidate for success because of motivation to quit and support system.    -Initiated varenicline titration of 0.5 mg by mouth once daily with food x3 days, then 0.5 mg by mouth twice daily with food x4 days, then 1 mg by mouth twice daily with food thereafter. Patient counseled on purpose, proper use, and potential adverse effects, including GI upset, and potential change in mood.  -Set quit plan, reduce cigarettes by 5/week beginning Monday 11/18/17. Goal is to be tobacco free by 12/12/17.  -Provided information on 1 800-QUIT NOW support program.

## 2017-11-22 ENCOUNTER — Encounter: Payer: Medicare Other | Admitting: Obstetrics & Gynecology

## 2017-11-22 DIAGNOSIS — Z0289 Encounter for other administrative examinations: Secondary | ICD-10-CM

## 2017-11-27 ENCOUNTER — Encounter (HOSPITAL_COMMUNITY): Payer: Self-pay | Admitting: Family Medicine

## 2017-11-27 ENCOUNTER — Ambulatory Visit (HOSPITAL_COMMUNITY)
Admission: EM | Admit: 2017-11-27 | Discharge: 2017-11-27 | Disposition: A | Discharge status: Home / Self Care | Payer: Medicare Other | Attending: Internal Medicine | Admitting: Internal Medicine

## 2017-11-27 DIAGNOSIS — G5602 Carpal tunnel syndrome, left upper limb: Secondary | ICD-10-CM | POA: Diagnosis not present

## 2017-11-27 MED ORDER — PREDNISONE 10 MG PO TABS: 10.0000 mg | ORAL_TABLET | Freq: Every day | ORAL | 0 refills | Status: DC

## 2017-11-27 NOTE — ED Provider Notes (Signed)
[employer]    CSN: [id number] Arrival date & time: 11/27/17  1940     History   Chief Complaint Chief Complaint  Patient presents with  . Numbness  . Hand Pain    HPI Alyssa Montgomery is a 57 y.o. female presents to the urgent care facility for evaluation of left hand numbness.  Patient states she said left hand numbness for the last 3 to 4 days.  This is been awakening her at nighttime and she has to shake her hand to get the sensation back.  Patient reports numbness and tingling in the thumb index middle and ring finger.  She denies any chest pain, shortness of breath, neck pain or cervical radicular symptoms.  No recent trauma or injury.  She is been taking naproxen for last couple days with no improvement.  She has not been wearing any bracing.  Denies any history of carpal tunnel syndrome.  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  HPI  Past Medical History:  Diagnosis Date  . Anxiety   . Bronchitis   . Diverticulitis   . Fibromyalgia   . GERD (gastroesophageal reflux disease)   . Headache   . Substance abuse ([place])    10 years ago ( cocaine)    Patient Active Problem List   Diagnosis Date Noted  . Hyperlipidemia 11/14/2017  . Anxiety 11/13/2016  . 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 Claudication ([place]) 09/03/2016  . TIA (transient ischemic attack) 08/10/2016  . History of cervical dysplasia 01/18/2016  . Diabetes ([place]) 04/17/2015  . Healthcare maintenance 04/17/2015  . Tobacco use disorder 04/17/2015  . Fibromyalgia 06/18/2014  . Preventative health care 01/21/2014  . Goiter 02/28/2013  . Tobacco abuse 01/18/2012  . FIBROIDS, UTERUS 03/09/2010  . DEPRESSION 12/26/2006  . GERD 12/26/2006    Past Surgical History:  Procedure Laterality Date  . ANKLE SURGERY    . BACK SURGERY    . Bowel obstruction    . CERVICAL CONIZATION W/BX N/A 12/27/2015   Procedure: CONIZATION CERVIX WITH BIOPSY;  Surgeon: Terrance Mass, MD;  Location: [facility];  Service: Gynecology;  Laterality: N/A;  . DILATATION & CURETTAGE/HYSTEROSCOPY WITH MYOSURE N/A 12/27/2015   Procedure: DILATATION & CURETTAGE/HYSTEROSCOPY WITH MYOSURE;  Surgeon: Terrance Mass, MD;  Location: [facility];  Service: Gynecology;  Laterality: N/A;  . ECTOPIC PREGNANCY SURGERY    . EYE SURGERY     removed right eye;     OB History    Gravida  6   Para  2   Term  2   Preterm      AB  4   Living  2     SAB  2   TAB  1   Ectopic  1   Multiple      Live Births               Home Medications    Prior to Admission medications   Medication Sig Start Date End Date Taking? Authorizing Provider  acetaminophen (TYLENOL) 325 MG tablet Take 650-975 mg by mouth 2 (two) times daily as needed for mild pain.    [provider]  albuterol (PROVENTIL HFA;VENTOLIN HFA) 108 (90 Base) MCG/ACT inhaler Inhale 2 puffs into the lungs every 6 (six) hours as needed for wheezing or shortness of breath. 09/24/17   Nicolette Bang, DO  Artificial Tear Solution (SOOTHE HYDRATION OP) Place 1 Dose into the  left eye 2 (two) times daily. Reported on 12/26/2015    [provider]  atorvastatin (LIPITOR) 40 MG tablet Take 1 tablet (40 mg total) by mouth daily. 11/14/17   Carlyle Dolly, MD  benzonatate (TESSALON) 100 MG capsule Take 1 capsule (100 mg total) by mouth 3 (three) times daily as needed for cough. 09/24/17   Nicolette Bang, DO  bismuth subsalicylate (PEPTO BISMOL) 262 MG/15ML suspension Take 30 mLs by mouth every 6 (six) hours as needed for indigestion.    [provider]  Ca Carbonate-Mag Hydroxide (ROLAIDS PO) Take 1-2 tablets by mouth 4 (four) times daily as needed (For indigestion.). Reported on 12/26/2015    [provider]  cetirizine (ZYRTEC) 10 MG tablet Take 1 tablet (10 mg total) by mouth daily. 07/04/17   Mikell, Jeani Sow, MD  Cholecalciferol (D3-1000) 1000 units tablet Take  1,000 Units by mouth daily. Reported on 12/26/2015    [provider]  Coenzyme Q10 (COQ-10) 200 MG CAPS Take 1 capsule by mouth daily. Reported on 12/26/2015    [provider]  cyclobenzaprine (FLEXERIL) 5 MG tablet Take 1 tablet (5 mg total) by mouth at bedtime as needed for muscle spasms. 10/24/17   Wurst, [place], PA-C  doxycycline (VIBRA-TABS) 100 MG tablet Take 1 tablet (100 mg total) by mouth 2 (two) times daily. 09/24/17   Nicolette Bang, DO  fluticasone (FLONASE) 50 MCG/ACT nasal spray Place 1 spray into both nostrils daily. 1 spray in each nostril every day 11/14/17   Carlyle Dolly, MD  HYDROcodone-homatropine [facility]) 5-1.5 MG/5ML syrup Take 5 mLs by mouth every 6 (six) hours as needed for cough. 11/24/16   Lily Kocher, PA-C  Menthol-Methyl Salicylate (MUSCLE RUB) 10-15 % CREA Apply 1 application topically as needed for muscle pain. Reported on 12/26/2015    [provider]  methylPREDNISolone (MEDROL DOSEPAK) 4 MG TBPK tablet Take 6-5-4-3-2-1 po qd 11/11/16   Lysbeth Penner, FNP  Multiple Vitamin (MULTIVITAMIN WITH MINERALS) TABS tablet Take 1 tablet by mouth daily. Reported on 12/26/2015    [provider]  naproxen (NAPROSYN) 375 MG tablet Take 1 tablet (375 mg total) by mouth 2 (two) times daily. 10/24/17   Wurst, [place], PA-C  ofloxacin (OCUFLOX) 0.3 % ophthalmic solution Place 2 drops into the left eye daily. Reported on 12/26/2015    [provider]  predniSONE (DELTASONE) 10 MG tablet Take 1 tablet (10 mg total) by mouth daily. 6,5,4,3,2,1 six day taper 11/27/17   Duanne Guess, PA-C  promethazine (PHENERGAN) 25 MG tablet Take 25 mg by mouth every 6 (six) hours as needed for nausea or vomiting. Reported on 12/26/2015    [provider]  simethicone (MYLICON) 749 MG chewable tablet Chew 125 mg by mouth every 6 (six) hours as needed for flatulence. Reported on 12/26/2015    [provider]  varenicline  (CHANTIX STARTING MONTH PAK) 0.5 MG X 11 & 1 MG X 42 tablet Take one 0.5 mg tablet by mouth once daily for 3 days, then increase to one 0.5 mg tablet twice daily for 4 days, then increase to one 1 mg tablet twice daily. 10/18/17   Everrett Coombe, MD  VOLTAREN 1 % GEL APPLY 2 G TOPICALLY DAILY AS NEEDED FOR MUSCLE PAIN 11/14/17   Carlyle Dolly, MD    Family History Family History  Problem Relation Age of Onset  . Cancer Father        lung  . Hypertension Mother   .  Arthritis Mother   . Other Neg Hx        pituitary problem    Social History Social History   Tobacco Use  . Smoking status: Current Every Day Smoker    Packs/day: 0.50    Years: 18.00    Pack years: 9.00    Types: Cigarettes  . Smokeless tobacco: Never Used  . Tobacco comment: 1-800 number given   Substance Use Topics  . Alcohol use: Yes    Alcohol/week: 0.6 oz    Types: 1 Glasses of wine per week    Comment: OCCASIONAL  . Drug use: No    Comment: clean 10 years crack cocaine     Allergies   Zanaflex [tizanidine hcl]; Penicillins; and Sulfonamide derivatives   Review of Systems Review of Systems  Constitutional: Negative for fever.  Respiratory: Negative for shortness of breath.   Cardiovascular: Negative for chest pain.  Gastrointestinal: Negative for abdominal pain.  Genitourinary: Negative for difficulty urinating, dysuria and urgency.  Musculoskeletal: Positive for arthralgias. Negative for back pain, joint swelling, myalgias and neck pain.  Skin: Negative for rash.  Neurological: Positive for numbness. Negative for dizziness and headaches.     Physical Exam Triage Vital Signs ED Triage Vitals  Enc Vitals Group     BP 11/27/17 1958 123/78     Pulse Rate 11/27/17 1958 85     Resp 11/27/17 1958 18     Temp 11/27/17 1958 98.5 F (36.9 C)     Temp src --      SpO2 11/27/17 1958 98 %     Weight --      Height --      Head Circumference --      Peak Flow --      Pain Score 11/27/17 1956 4      Pain Loc --      Pain Edu? --      Excl. in [place]? --    No data found.  Updated Vital Signs BP 123/78   Pulse 85   Temp 98.5 F (36.9 C)   Resp 18   SpO2 98%   Visual Acuity Right Eye Distance:   Left Eye Distance:   Bilateral Distance:    Right Eye Near:   Left Eye Near:    Bilateral Near:     Physical Exam  Constitutional: She is oriented to person, place, and time. She appears well-developed and well-nourished.  HENT:  Head: Normocephalic and atraumatic.  Eyes: Conjunctivae are normal.  Neck: Normal range of motion.  Cardiovascular: Normal rate.  Pulmonary/Chest: Effort normal. No respiratory distress.  Musculoskeletal: Normal range of motion.  Full active range of motion of both upper extremities with no weakness or neurological deficits.  She has full range of motion cervical spine with no radicular symptoms and negative Spurling's test bilaterally.  Patient has positive Tinel's and Phalen sign on the left wrist which reproduces numbness and tingling in the median nerve distribution.  There is no thenar atrophy.  Neurological: She is alert and oriented to person, place, and time.  Skin: Skin is warm. No rash noted.  Psychiatric: She has a normal mood and affect. Her behavior is normal. Thought content normal.     UC Treatments / Results  Labs (all labs ordered are listed, but only abnormal results are displayed) Labs Reviewed - No data to display  EKG None  Radiology No results found.  Procedures Procedures (including critical care time)  Medications Ordered in UC Medications -  No data to display  Initial Impression / Assessment and Plan / UC Course  I have reviewed the triage vital signs and the nursing notes.  Pertinent labs & imaging results that were available during my care of the patient were reviewed by me and considered in my medical decision making (see chart for details).     57 year old female with left carpal tunnel syndrome.  She is  placed into a Velcro wrist brace.  She does not appear to be having any cervical nerve impingement.  Her vital signs are stable.  No chest pain or shortness of breath.  Physical exam and history consistent with left carpal tunnel syndrome.  She will wear Velcro wrist brace for 4 weeks and follow-up PCP if no improvement.  She was placed on a Medrol Dosepak to help with acute inflammation and will discontinue proximal until she finishes the Medrol Dosepak. Final Clinical Impressions(s) / UC Diagnoses   Final diagnoses:  Left carpal tunnel syndrome     Discharge Instructions     Please take prednisone as prescribed, wear Velcro wrist brace at all times except for showering for next 4 weeks.  Follow-up with primary care provider and orthopedist in 4 weeks if no improvement.   ED Prescriptions    Medication Sig Dispense Auth. Provider   predniSONE (DELTASONE) 10 MG tablet Take 1 tablet (10 mg total) by mouth daily. 6,5,4,3,2,1 six day taper 21 tablet Renata Caprice       Duanne Guess, [place] 11/27/17 2021

## 2017-11-27 NOTE — ED Triage Notes (Addendum)
Pt here for left hand and arm numbness, weakness and pain over the last 3 days. sts also right foot numbness. She did have a fall about a month ago and used hand to break the fall. She has been stressed and recently was told that her cholesterol was elevated. She just started taking cholesterol meds. Denies any chest pain or SOB but admits to indigestion and gas.

## 2017-11-27 NOTE — Discharge Instructions (Addendum)
Please take prednisone as prescribed, wear Velcro wrist brace at all times except for showering for next 4 weeks.  Follow-up with primary care provider and orthopedist in 4 weeks if no improvement.

## 2017-11-28 ENCOUNTER — Telehealth: Payer: Self-pay | Admitting: Pharmacist

## 2017-11-28 NOTE — Telephone Encounter (Signed)
Called pt to f/u tobacco cessation success. No answer. Will attempt to contact again next week.   Carlean Jews, Pharm.D., BCPS PGY2 Ambulatory [facility] Pharmacy Resident Phone: [phone]

## 2017-11-29 ENCOUNTER — Telehealth: Payer: Self-pay | Admitting: Pharmacist

## 2017-11-29 NOTE — Telephone Encounter (Signed)
-----   Message from Leavy Cella, [facility] sent at 11/14/2017 11:23 AM EDT ----- Regarding: Call and f/u on smoking cessation Should be down to 10 cigs/day. Started Chantix. Quit date = 12/12/17

## 2017-11-29 NOTE — Telephone Encounter (Signed)
Attempted phone contact on 6/20 and 6/21  Phone number provider did not go through.  Will f/u at next appointment.

## 2017-12-11 ENCOUNTER — Ambulatory Visit: Payer: Self-pay | Admitting: Family Medicine

## 2017-12-13 ENCOUNTER — Emergency Department (HOSPITAL_COMMUNITY)
Admission: EM | Admit: 2017-12-13 | Discharge: 2017-12-14 | Disposition: A | Discharge status: Home / Self Care | Payer: Medicare Other | Attending: Emergency Medicine | Admitting: Emergency Medicine

## 2017-12-13 ENCOUNTER — Emergency Department (HOSPITAL_COMMUNITY): Payer: Medicare Other

## 2017-12-13 ENCOUNTER — Encounter (HOSPITAL_COMMUNITY): Payer: Self-pay | Admitting: Nurse Practitioner

## 2017-12-13 DIAGNOSIS — M542 Cervicalgia: Secondary | ICD-10-CM | POA: Diagnosis not present

## 2017-12-13 DIAGNOSIS — Z8673 Personal history of transient ischemic attack (TIA), and cerebral infarction without residual deficits: Secondary | ICD-10-CM | POA: Insufficient documentation

## 2017-12-13 DIAGNOSIS — F17210 Nicotine dependence, cigarettes, uncomplicated: Secondary | ICD-10-CM | POA: Diagnosis not present

## 2017-12-13 DIAGNOSIS — Z79899 Other long term (current) drug therapy: Secondary | ICD-10-CM | POA: Insufficient documentation

## 2017-12-13 DIAGNOSIS — M25551 Pain in right hip: Secondary | ICD-10-CM | POA: Diagnosis not present

## 2017-12-13 DIAGNOSIS — R202 Paresthesia of skin: Secondary | ICD-10-CM

## 2017-12-13 DIAGNOSIS — M130 Polyarthritis, unspecified: Secondary | ICD-10-CM | POA: Insufficient documentation

## 2017-12-13 DIAGNOSIS — E119 Type 2 diabetes mellitus without complications: Secondary | ICD-10-CM | POA: Diagnosis not present

## 2017-12-13 DIAGNOSIS — R51 Headache: Secondary | ICD-10-CM | POA: Diagnosis not present

## 2017-12-13 DIAGNOSIS — S0990XA Unspecified injury of head, initial encounter: Secondary | ICD-10-CM | POA: Diagnosis not present

## 2017-12-13 DIAGNOSIS — M545 Low back pain: Secondary | ICD-10-CM | POA: Diagnosis not present

## 2017-12-13 DIAGNOSIS — M2550 Pain in unspecified joint: Secondary | ICD-10-CM

## 2017-12-13 DIAGNOSIS — M25552 Pain in left hip: Secondary | ICD-10-CM | POA: Diagnosis not present

## 2017-12-13 LAB — I-STAT CHEM 8, ED
BUN: 14 mg/dL (ref 6–20)
Calcium, Ion: 1.18 mmol/L (ref 1.15–1.40)
Chloride: 104 mmol/L (ref 98–111)
Creatinine, Ser: 0.8 mg/dL (ref 0.44–1.00)
Glucose, Bld: 115 mg/dL — ABNORMAL HIGH (ref 70–99)
HCT: 37 % (ref 36.0–46.0)
Hemoglobin: 12.6 g/dL (ref 12.0–15.0)
Potassium: 3.1 mmol/L — ABNORMAL LOW (ref 3.5–5.1)
Sodium: 142 mmol/L (ref 135–145)
TCO2: 25 mmol/L (ref 22–32)

## 2017-12-13 MED ORDER — LIDOCAINE 5 % EX PTCH: 1.0000 | MEDICATED_PATCH | CUTANEOUS | 0 refills | Status: DC

## 2017-12-13 MED ORDER — NAPROXEN 500 MG PO TABS: 500.0000 mg | ORAL_TABLET | Freq: Two times a day (BID) | ORAL | 0 refills | Status: DC

## 2017-12-13 MED ORDER — FAMOTIDINE 20 MG PO TABS: 20.0000 mg | ORAL_TABLET | Freq: Two times a day (BID) | ORAL | 0 refills | Status: DC

## 2017-12-13 MED ORDER — KETOROLAC TROMETHAMINE 60 MG/2ML IM SOLN
60.0000 mg | Freq: Once | INTRAMUSCULAR | Status: AC
  Administered 2017-12-14: 60 mg via INTRAMUSCULAR
  Filled 2017-12-13: qty 2

## 2017-12-13 MED ORDER — CYCLOBENZAPRINE HCL 5 MG PO TABS: 5.0000 mg | ORAL_TABLET | Freq: Three times a day (TID) | ORAL | 0 refills | Status: DC | PRN

## 2017-12-13 MED ORDER — POTASSIUM CHLORIDE CRYS ER 20 MEQ PO TBCR
20.0000 meq | EXTENDED_RELEASE_TABLET | Freq: Once | ORAL | Status: AC
  Administered 2017-12-14: 20 meq via ORAL
  Filled 2017-12-13: qty 1

## 2017-12-13 NOTE — ED Triage Notes (Signed)
Pt is c/o pain in multiple areas stating she is "hurting everywhere", bilateral shoulders, lower back worse on the left radiating to left leg, right him and groin and "compelete numbness on her right arm and mild numbness to the right. Pt is also c/o feeling tired all the time and notes that she was once told she "has fibromyalgia." Seen at UC recently as well and diagnosed with carpel tunnel tendonitis.

## 2017-12-13 NOTE — Discharge Instructions (Addendum)
The cause of your symptoms was not identified today.  Stop taking the over the counter back and body medication and do not use your muscle creams more than directed.

## 2017-12-13 NOTE — ED Provider Notes (Signed)
[facility] DEPT Provider Note   CSN: [id number] Arrival date & time: 12/13/17  1543     History   Chief Complaint Chief Complaint  Patient presents with  . Multiple Complaints    HPI Alyssa Montgomery is a 57 y.o. female.  The history is provided by the patient. No language interpreter was used.   Alyssa Montgomery is a 57 y.o. female who presents to the Emergency Department complaining of paresthesias and joint pain. Complains of paresthesia to the left hand and forearm that started about three weeks ago. Her arm feels like it is numb. She has been seen in urgent care for this and was told it was carpal tunnel and has been wearing a wrist splint. Her symptoms have persisted to worsen since then. Today when she awoke she had numbness in her right arm as well as her right foot. That numbness has since resolved. She also endorses severe joint pains and body aches. She has pain and bilateral hips and low back as well as bilateral shoulders and neck. Pain is constant in nature and worsening over time. She did start on a cholesterol medication two weeks ago but states that her pains and numbness began prior to initiating that medication. She endorses intermittent headaches as well as coughing, intermittent abdominal pain, occasional dysuria, now resolved. She has been taking voltaren gel as well as over-the-counter muscle cream. She states there is no improvement with these treatments. Past Medical History:  Diagnosis Date  . Anxiety   . Bronchitis   . Diverticulitis   . Fibromyalgia   . GERD (gastroesophageal reflux disease)   . Headache   . Substance abuse ([place])    10 years ago ( cocaine)    Patient Active Problem List   Diagnosis Date Noted  . Hyperlipidemia 11/14/2017  . Anxiety 11/13/2016  . 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 Claudication ([place]) 09/03/2016  . TIA (transient ischemic attack) 08/10/2016  . History of cervical dysplasia 01/18/2016    . Diabetes ([place]) 04/17/2015  . Healthcare maintenance 04/17/2015  . Tobacco use disorder 04/17/2015  . Fibromyalgia 06/18/2014  . Preventative health care 01/21/2014  . Goiter 02/28/2013  . Tobacco abuse 01/18/2012  . FIBROIDS, UTERUS 03/09/2010  . DEPRESSION 12/26/2006  . GERD 12/26/2006    Past Surgical History:  Procedure Laterality Date  . ANKLE SURGERY    . BACK SURGERY    . Bowel obstruction    . CERVICAL CONIZATION W/BX N/A 12/27/2015   Procedure: CONIZATION CERVIX WITH BIOPSY;  Surgeon: Terrance Mass, MD;  Location: [facility];  Service: Gynecology;  Laterality: N/A;  . DILATATION & CURETTAGE/HYSTEROSCOPY WITH MYOSURE N/A 12/27/2015   Procedure: DILATATION & CURETTAGE/HYSTEROSCOPY WITH MYOSURE;  Surgeon: Terrance Mass, MD;  Location: [facility];  Service: Gynecology;  Laterality: N/A;  . ECTOPIC PREGNANCY SURGERY    . EYE SURGERY     removed right eye;      OB History    Gravida  6   Para  2   Term  2   Preterm      AB  4   Living  2     SAB  2   TAB  1   Ectopic  1   Multiple      Live Births               Home Medications    Prior to Admission medications   Medication Sig Start Date End Date Taking?  Authorizing Provider  atorvastatin (LIPITOR) 40 MG tablet Take 1 tablet (40 mg total) by mouth daily. 11/14/17  Yes Carlyle Dolly, MD  Ca Carbonate-Mag Hydroxide (ROLAIDS PO) Take 1-2 tablets by mouth 4 (four) times daily as needed (For indigestion.). Reported on 12/26/2015   Yes [provider]  Calcium Carbonate-Simethicone (ALKA-SELTZER HEARTBURN + GAS) 750-80 MG CHEW Chew 1 tablet by mouth 2 (two) times daily as needed (heartburn).   Yes [provider]  Cholecalciferol (D3-1000) 1000 units tablet Take 1,000 Units by mouth daily. Reported on 12/26/2015   Yes [provider]  fluticasone (FLONASE) 50 MCG/ACT nasal spray Place 1 spray into both nostrils daily. 1 spray in each nostril every day 11/14/17  Yes Carlyle Dolly, MD  loratadine (ALLERGY) 10 MG tablet Take 10 mg by mouth daily as needed for allergies.   Yes [provider]  Menthol-Methyl Salicylate (MUSCLE RUB) 10-15 % CREA Apply 1 application topically as needed for muscle pain. Reported on 12/26/2015   Yes [provider]  acetaminophen (TYLENOL) 325 MG tablet Take 325-975 mg by mouth 2 (two) times daily as needed for mild pain.     [provider]  albuterol (PROVENTIL HFA;VENTOLIN HFA) 108 (90 Base) MCG/ACT inhaler Inhale 2 puffs into the lungs every 6 (six) hours as needed for wheezing or shortness of breath. 09/24/17   Nicolette Bang, DO  Artificial Tear Solution (SOOTHE HYDRATION OP) Place 1 Dose into the left eye 2 (two) times daily. Reported on 12/26/2015    [provider]  cyclobenzaprine (FLEXERIL) 5 MG tablet Take 1 tablet (5 mg total) by mouth 3 (three) times daily as needed for muscle spasms. 12/13/17   Quintella Reichert, MD  famotidine (PEPCID) 20 MG tablet Take 1 tablet (20 mg total) by mouth 2 (two) times daily. 12/13/17   Quintella Reichert, MD  lidocaine (LIDODERM) 5 % Place 1 patch onto the skin daily. Remove & Discard patch within 12 hours or as directed by MD 12/13/17   Quintella Reichert, MD  naproxen (NAPROSYN) 500 MG tablet Take 1 tablet (500 mg total) by mouth 2 (two) times daily with a meal. 12/13/17   Quintella Reichert, MD  varenicline (CHANTIX STARTING MONTH PAK) 0.5 MG X 11 & 1 MG X 42 tablet Take one 0.5 mg tablet by mouth once daily for 3 days, then increase to one 0.5 mg tablet twice daily for 4 days, then increase to one 1 mg tablet twice daily. 10/18/17   Everrett Coombe, MD  VOLTAREN 1 % GEL APPLY 2 G TOPICALLY DAILY AS NEEDED FOR MUSCLE PAIN 11/14/17   Carlyle Dolly, MD    Family History Family History  Problem Relation Age of Onset  . Cancer Father        lung  . Hypertension Mother   . Arthritis Mother   . Other Neg Hx        pituitary problem    Social History Social  History   Tobacco Use  . Smoking status: Current Every Day Smoker    Packs/day: 0.50    Years: 18.00    Pack years: 9.00    Types: Cigarettes  . Smokeless tobacco: Never Used  . Tobacco comment: 1-800 number given   Substance Use Topics  . Alcohol use: Yes    Alcohol/week: 0.6 oz    Types: 1 Glasses of wine per week    Comment: OCCASIONAL  . Drug use: No    Comment: clean 10 years  crack cocaine     Allergies   Zanaflex [tizanidine hcl]; Penicillins; and Sulfonamide derivatives   Review of Systems Review of Systems  All other systems reviewed and are negative.    Physical Exam Updated Vital Signs BP (!) 141/93 (BP Location: Right Arm)   Pulse 67   Temp 98.5 F (36.9 C) (Oral)   Resp 17   SpO2 100%   Physical Exam  Constitutional: She is oriented to person, place, and time. She appears well-developed and well-nourished.  HENT:  Head: Normocephalic and atraumatic.  Eyes:  Prosthetic right eye. Left eye is reactive to light and tracks in all directions.  Cardiovascular: Normal rate and regular rhythm.  No murmur heard. Pulmonary/Chest: Effort normal and breath sounds normal. No respiratory distress.  Abdominal: Soft. There is no tenderness. There is no rebound and no guarding.  Musculoskeletal: She exhibits no edema or tenderness.  2+ radial pulses bilaterally.  Neurological: She is alert and oriented to person, place, and time.  Five out of five strength in all four extremities with sensation to light touch intact in all four extremities. Patient does endorse altered sensation to light touch over the left forearm. No pronator drift.  Skin: Skin is warm and dry.  Psychiatric: She has a normal mood and affect. Her behavior is normal.  Nursing note and vitals reviewed.    ED Treatments / Results  Labs (all labs ordered are listed, but only abnormal results are displayed) Labs Reviewed  I-STAT CHEM 8, ED - Abnormal; Notable for the following components:       Result Value   Potassium 3.1 (*)    Glucose, Bld 115 (*)    All other components within normal limits    EKG None  Radiology Ct Head Wo Contrast  Result Date: 12/13/2017 CLINICAL DATA:  Pain all over. Numbness to the right arm and right side. Tired feeling. EXAM: CT HEAD WITHOUT CONTRAST TECHNIQUE: Contiguous axial images were obtained from the base of the skull through the vertex without intravenous contrast. COMPARISON:  None. FINDINGS: Brain: No evidence of acute infarction, hemorrhage, hydrocephalus, extra-axial collection or mass lesion/mass effect. Vascular: No hyperdense vessel or unexpected calcification. Skull: Normal. Negative for fracture or focal lesion. Sinuses/Orbits: Old nasal bone fractures. Paranasal sinuses and mastoid air cells are clear. Postoperative changes in the right globe. Other: None IMPRESSION: No acute intracranial abnormalities. Postoperative changes in the right globe. Old appearing nasal bone fractures. Electronically Signed   By: Lucienne Capers M.D.   On: 12/13/2017 22:22    Procedures Procedures (including critical care time)  Medications Ordered in ED Medications  ketorolac (TORADOL) injection 60 mg (has no administration in time range)  potassium chloride SA (K-DUR,KLOR-CON) CR tablet 20 mEq (has no administration in time range)     Initial Impression / Assessment and Plan / ED Course  I have reviewed the triage vital signs and the nursing notes.  Pertinent labs & imaging results that were available during my care of the patient were reviewed by me and considered in my medical decision making (see chart for details).     Patient here for evaluation of several weeks of paresthesias, body aches. She is non-toxic appearing on examination, neurovascularly intact on evaluation. Presentation is not consistent with CVA, myositis, rhabdomyolysis. Labs do demonstrate mild hypokalemia, will replace. Discussed with patient findings of studies. Discussed  importance of PCP follow-up for further evaluation and testing. Return precautions discussed.  Final Clinical Impressions(s) / ED Diagnoses   Final diagnoses:  Paresthesia  Polyarthralgia    ED Discharge Orders        Ordered    naproxen (NAPROSYN) 500 MG tablet  2 times daily with meals     12/13/17 2334    famotidine (PEPCID) 20 MG tablet  2 times daily     12/13/17 2334    lidocaine (LIDODERM) 5 %  Every 24 hours     12/13/17 2334    cyclobenzaprine (FLEXERIL) 5 MG tablet  3 times daily PRN     12/13/17 2334       Quintella Reichert, MD 12/13/17 2349

## 2017-12-14 DIAGNOSIS — R202 Paresthesia of skin: Secondary | ICD-10-CM | POA: Diagnosis not present

## 2017-12-20 ENCOUNTER — Ambulatory Visit (INDEPENDENT_AMBULATORY_CARE_PROVIDER_SITE_OTHER): Payer: Medicare Other | Admitting: Student in an Organized Health Care Education/Training Program

## 2017-12-20 ENCOUNTER — Other Ambulatory Visit: Payer: Self-pay

## 2017-12-20 ENCOUNTER — Encounter: Payer: Self-pay | Admitting: Student in an Organized Health Care Education/Training Program

## 2017-12-20 VITALS — BP 136/74 | HR 70 | Temp 98.7°F | Ht 64.0 in | Wt 222.8 lb

## 2017-12-20 DIAGNOSIS — R52 Pain, unspecified: Secondary | ICD-10-CM | POA: Diagnosis not present

## 2017-12-20 DIAGNOSIS — Z0000 Encounter for general adult medical examination without abnormal findings: Secondary | ICD-10-CM | POA: Diagnosis not present

## 2017-12-20 DIAGNOSIS — R252 Cramp and spasm: Secondary | ICD-10-CM

## 2017-12-20 DIAGNOSIS — N644 Mastodynia: Secondary | ICD-10-CM | POA: Diagnosis not present

## 2017-12-20 NOTE — Patient Instructions (Signed)
It was a pleasure seeing you today in our clinic. Today we discussed your pain. Here is the treatment plan we have discussed and agreed upon together:  We drew blood work at today's visit. I will call or send you a letter with these results. If you do not hear from me within the next week, please give our office a call.  Sign up for My Chart to have easy access to your labs results, and communication with your primary care physician.   Please stop taking your statin for now. I will call you with your lab results and we can discuss a plan further. Please return for a follow up visit at your earliest convenience.  Our clinic's number is [phone]. Please call with questions or concerns about what we discussed today.  Be well, Dr. Burr Medico

## 2017-12-20 NOTE — Progress Notes (Addendum)
CC: Total body soreness  HPI: Alyssa Montgomery is a 57 y.o. female with PMH significant for depression, anxiety, tobacco use, fibromyalgia, TIA, GERD who presents to [facility] today with total body pain of several months duration.   Total Body Pain Today patient reports that her entire body hurts. She strongly requests referral to orthopedics. Pain has been "relentless" and is in shoulders and neck, arms, body, legs. She has a histor yof fibromyalgia. She reports this pain has been going on for months. She was started on a statin after the pain began, however she does feel that the pain has worsened since starting the statin. No hx of injury. No fevers or weight loss. Pain is not localized.  Left breast soreness - patient attempted to schedule a mammogram but because she has a complaint of L breast soreness this was not able to be completed as a screening mammo, must be placed as a diagnostic mammo. She denies feeling any mass. Soreness is on left breast lateral to the areola. No fevers. Denies skin changes in the L breast. Denies discharge.  Review of Symptoms:  See HPI for ROS.   CC, SH/smoking status, and VS noted.  Objective: BP 136/74   Pulse 70   Temp 98.7 F (37.1 C) (Oral)   Ht _0  (1.626 m)   Wt 222 lb 12.8 oz (101.1 kg)   SpO2 97%   BMI 38.24 kg/m  GEN: NAD, alert, irritable NECK: full ROM, no thyromegaly RESPIRATORY: clear to auscultation bilaterally with no wheezes, rhonchi or rales, good effort CV: RRR, no m/r/g, no peripheral edema BREAST: No palpable masses bilaterally, no skin changes, no palpable lymph nodes GI: soft, non-tender, non-distended, no hepatosplenomegaly SKIN: warm and dry, no rashes or lesions NEURO: II-XII grossly intact, normal gait, peripheral sensation intact PSYCH: AAOx3, appropriate affect  CMA present for breast exam  Assessment and plan:  Complaints of total body pain Symptoms may be consistent with fibromyalgia, however patient is very  concerned about her pain and strongly requests an orthopedic referral. We can check ESR and sed rate to be sure nothing more serious is going on for her. She had recent labwork with a normal CMP in 10/2017 so we will hold off on repeating this test.  - DC statin medication until CK results - C-reactive protein - Sedimentation rate -  Check CK - TSH - return for follow up in 1 week, at that time if lab results are negative would test PHQ9, GAD7, consider treatment for fibromyalgia   Soreness breast Normal breast exam. Will reorder as diagnostic ultrasound given complaints of discomfort.   Orders Placed This Encounter  Procedures  . MM DIAG BREAST TOMO BILATERAL    INS-MCR PF 09/14/14 @ [facility]/NO NEEDS/TOMO/BC APRIL @ [facility] COSIGN REQ    Standing Status:   Future    Number of Occurrences:   1    Standing Expiration Date:   02/21/2019    Order Specific Question:   Reason for Exam (SYMPTOM  OR DIAGNOSIS REQUIRED)    Answer:   left breast soreness    Order Specific Question:   Is the patient pregnant?    Answer:   No    Order Specific Question:   Preferred imaging location?    Answer:   [facility]  . C-reactive protein  . Sedimentation rate  . CK  . TSH    No orders of the defined types were placed in this encounter.   Everrett Coombe, MD,MS,  PGY2 12/26/2017 4:40 AM

## 2017-12-20 NOTE — Assessment & Plan Note (Addendum)
Symptoms may be consistent with fibromyalgia, however patient is very concerned about her pain and strongly requests an orthopedic referral. We can check ESR and sed rate to be sure nothing more serious is going on for her. She had recent labwork with a normal CMP in 10/2017 so we will hold off on repeating this test.  - DC statin medication until CK results - C-reactive protein - Sedimentation rate -  Check CK - TSH - return for follow up in 1 week, at that time if lab results are negative would test PHQ9, GAD7, consider treatment for fibromyalgia

## 2017-12-21 LAB — C-REACTIVE PROTEIN: CRP: 5 mg/L (ref 0–10)

## 2017-12-21 LAB — SEDIMENTATION RATE: Sed Rate: 46 mm/hr — ABNORMAL HIGH (ref 0–40)

## 2017-12-21 LAB — CK: Total CK: 213 U/L — ABNORMAL HIGH (ref 24–173)

## 2017-12-24 ENCOUNTER — Telehealth: Payer: Self-pay

## 2017-12-24 NOTE — Telephone Encounter (Signed)
Patient calling for lab results from 12/20/17.  Call back [phone]  Danley Danker, RN [facility] RN)

## 2017-12-25 ENCOUNTER — Ambulatory Visit
Admission: RE | Admit: 2017-12-25 | Discharge: 2017-12-25 | Disposition: A | Discharge status: Home / Self Care | Payer: Medicare Other | Source: Ambulatory Visit | Attending: Family Medicine | Admitting: Family Medicine

## 2017-12-25 ENCOUNTER — Ambulatory Visit: Payer: Self-pay

## 2017-12-25 DIAGNOSIS — N644 Mastodynia: Secondary | ICD-10-CM | POA: Diagnosis not present

## 2017-12-25 NOTE — Telephone Encounter (Signed)
Please let patient know that her lab results did not suggest an inflammatory process in her body to explain her pain.   She should continue a trial off of the statin medication and we will follow up at her 7/29 appointment to see if that medication was contributing to her discomfort.

## 2017-12-26 NOTE — Assessment & Plan Note (Signed)
Normal breast exam. Will reorder as diagnostic ultrasound given complaints of discomfort.

## 2017-12-27 ENCOUNTER — Emergency Department (HOSPITAL_COMMUNITY): Payer: Medicare Other

## 2017-12-27 ENCOUNTER — Other Ambulatory Visit: Payer: Self-pay

## 2017-12-27 ENCOUNTER — Emergency Department (HOSPITAL_COMMUNITY)
Admission: EM | Admit: 2017-12-27 | Discharge: 2017-12-27 | Disposition: A | Discharge status: Home / Self Care | Payer: Medicare Other | Attending: Emergency Medicine | Admitting: Emergency Medicine

## 2017-12-27 ENCOUNTER — Encounter (HOSPITAL_COMMUNITY): Payer: Self-pay

## 2017-12-27 DIAGNOSIS — R197 Diarrhea, unspecified: Secondary | ICD-10-CM | POA: Diagnosis not present

## 2017-12-27 DIAGNOSIS — R112 Nausea with vomiting, unspecified: Secondary | ICD-10-CM | POA: Insufficient documentation

## 2017-12-27 DIAGNOSIS — R1084 Generalized abdominal pain: Secondary | ICD-10-CM | POA: Diagnosis not present

## 2017-12-27 DIAGNOSIS — F17210 Nicotine dependence, cigarettes, uncomplicated: Secondary | ICD-10-CM | POA: Diagnosis not present

## 2017-12-27 DIAGNOSIS — E119 Type 2 diabetes mellitus without complications: Secondary | ICD-10-CM | POA: Diagnosis not present

## 2017-12-27 DIAGNOSIS — Z8673 Personal history of transient ischemic attack (TIA), and cerebral infarction without residual deficits: Secondary | ICD-10-CM | POA: Diagnosis not present

## 2017-12-27 DIAGNOSIS — R1013 Epigastric pain: Secondary | ICD-10-CM | POA: Diagnosis present

## 2017-12-27 DIAGNOSIS — M79602 Pain in left arm: Secondary | ICD-10-CM | POA: Insufficient documentation

## 2017-12-27 DIAGNOSIS — R0602 Shortness of breath: Secondary | ICD-10-CM | POA: Diagnosis not present

## 2017-12-27 DIAGNOSIS — Z79899 Other long term (current) drug therapy: Secondary | ICD-10-CM | POA: Diagnosis not present

## 2017-12-27 DIAGNOSIS — R079 Chest pain, unspecified: Secondary | ICD-10-CM | POA: Diagnosis not present

## 2017-12-27 LAB — HEPATIC FUNCTION PANEL
ALT: 19 U/L (ref 0–44)
AST: 39 U/L (ref 15–41)
Albumin: 4 g/dL (ref 3.5–5.0)
Alkaline Phosphatase: 102 U/L (ref 38–126)
Bilirubin, Direct: 0.6 mg/dL — ABNORMAL HIGH (ref 0.0–0.2)
Indirect Bilirubin: 0.6 mg/dL (ref 0.3–0.9)
Total Bilirubin: 1.2 mg/dL (ref 0.3–1.2)
Total Protein: 6.9 g/dL (ref 6.5–8.1)

## 2017-12-27 LAB — BASIC METABOLIC PANEL
Anion gap: 13 (ref 5–15)
BUN: 17 mg/dL (ref 6–20)
CO2: 22 mmol/L (ref 22–32)
Calcium: 9.3 mg/dL (ref 8.9–10.3)
Chloride: 106 mmol/L (ref 98–111)
Creatinine, Ser: 0.75 mg/dL (ref 0.44–1.00)
GFR calc Af Amer: >60 mL/min (ref >60)
GFR calc non Af Amer: >60 mL/min (ref >60)
Glucose, Bld: 95 mg/dL (ref 70–99)
Potassium: 5.1 mmol/L (ref 3.5–5.1)
Sodium: 141 mmol/L (ref 135–145)

## 2017-12-27 LAB — CBC
HCT: 42.5 % (ref 36.0–46.0)
Hemoglobin: 13.3 g/dL (ref 12.0–15.0)
MCH: 26.5 pg (ref 26.0–34.0)
MCHC: 31.3 g/dL (ref 30.0–36.0)
MCV: 84.7 fL (ref 78.0–100.0)
Platelets: 240 10*3/uL (ref 150–400)
RBC: 5.02 MIL/uL (ref 3.87–5.11)
RDW: 14.4 % (ref 11.5–15.5)
WBC: 4.5 10*3/uL (ref 4.0–10.5)

## 2017-12-27 LAB — CK: Total CK: 317 U/L — ABNORMAL HIGH (ref 38–234)

## 2017-12-27 LAB — URINALYSIS, ROUTINE W REFLEX MICROSCOPIC
Bacteria, UA: NONE SEEN
Bilirubin Urine: NEGATIVE
Glucose, UA: NEGATIVE mg/dL
Ketones, ur: 5 mg/dL — AB
Leukocytes, UA: NEGATIVE
Nitrite: NEGATIVE
Protein, ur: NEGATIVE mg/dL
Specific Gravity, Urine: 1.014 (ref 1.005–1.030)
pH: 5 (ref 5.0–8.0)

## 2017-12-27 LAB — I-STAT TROPONIN, ED: Troponin i, poc: 0.01 ng/mL (ref 0.00–0.08)

## 2017-12-27 LAB — I-STAT BETA HCG BLOOD, ED (MC, WL, AP ONLY): I-stat hCG, quantitative: <5 m[IU]/mL (ref <5)

## 2017-12-27 LAB — LIPASE, BLOOD: Lipase: 28 U/L (ref 11–51)

## 2017-12-27 MED ORDER — METOCLOPRAMIDE HCL 10 MG PO TABS
10.0000 mg | ORAL_TABLET | Freq: Three times a day (TID) | ORAL | Status: DC | PRN
  Administered 2017-12-27: 10 mg via ORAL
  Filled 2017-12-27: qty 1

## 2017-12-27 MED ORDER — KETOROLAC TROMETHAMINE 15 MG/ML IJ SOLN
15.0000 mg | Freq: Once | INTRAMUSCULAR | Status: AC
  Administered 2017-12-27: 15 mg via INTRAVENOUS
  Filled 2017-12-27: qty 1

## 2017-12-27 MED ORDER — GI COCKTAIL ~~LOC~~
30.0000 mL | Freq: Once | ORAL | Status: AC
  Administered 2017-12-27: 30 mL via ORAL
  Filled 2017-12-27: qty 30

## 2017-12-27 MED ORDER — ONDANSETRON HCL 4 MG/2ML IJ SOLN: 4.0000 mg | Freq: Once | INTRAMUSCULAR | Status: DC

## 2017-12-27 MED ORDER — PROMETHAZINE HCL 25 MG/ML IJ SOLN
25.0000 mg | Freq: Once | INTRAMUSCULAR | Status: AC
Start: 2017-12-27 — End: 2017-12-27
  Administered 2017-12-27: 25 mg via INTRAVENOUS
  Filled 2017-12-27: qty 1

## 2017-12-27 MED ORDER — LIDOCAINE 5 % EX PTCH: 1.0000 | MEDICATED_PATCH | CUTANEOUS | 0 refills | Status: DC

## 2017-12-27 MED ORDER — FAMOTIDINE IN NACL 20-0.9 MG/50ML-% IV SOLN
20.0000 mg | Freq: Once | INTRAVENOUS | Status: AC
  Administered 2017-12-27: 20 mg via INTRAVENOUS
  Filled 2017-12-27: qty 50

## 2017-12-27 MED ORDER — METOCLOPRAMIDE HCL 5 MG/ML IJ SOLN
10.0000 mg | Freq: Once | INTRAMUSCULAR | Status: AC
  Administered 2017-12-27: 10 mg via INTRAVENOUS
  Filled 2017-12-27: qty 2

## 2017-12-27 MED ORDER — SODIUM CHLORIDE 0.9 % IV BOLUS
1000.0000 mL | Freq: Once | INTRAVENOUS | Status: AC
  Administered 2017-12-27: 1000 mL via INTRAVENOUS

## 2017-12-27 NOTE — ED Notes (Signed)
Pt has prolonged QT. MD Little made aware of N/V/D.

## 2017-12-27 NOTE — ED Notes (Addendum)
Pt can be heard from nurses station attempting to make herself vomit in the restroom.

## 2017-12-27 NOTE — ED Triage Notes (Signed)
Pt endorses "upset stomach" that began yesterday.Marland KitchenMarland KitchenAlso, pt complains of left arm pain from the mid forearm to the hand, wearing wrist brace due to being diagnosed with carpal tunnel at [facility] recently. States that left hand has been numb since yesterday. VSS.

## 2017-12-27 NOTE — Discharge Instructions (Addendum)
Do not take anti-inflammatories.  This includes ibuprofen, Motrin, Advil, naproxen.  You may take Tylenol as needed for pain. Start taking the famotidine as prescribed. Use Lidoderm patches as needed for muscle pain. Make sure you are staying well-hydrated with water.  Your urine should be clear to pale yellow. Follow up with your primary care doctor if your symptoms are not improving.  Return to the emergency room if you develop any new, worsening, or concerning symptoms.

## 2017-12-27 NOTE — ED Provider Notes (Signed)
[facility] EMERGENCY DEPARTMENT Provider Note   CSN: [id number] Arrival date & time: 12/27/17  1117     History   Chief Complaint Chief Complaint  Patient presents with  . Arm Pain  . Nausea    HPI Alyssa Montgomery is a 57 y.o. female presenting for evaluation of nausea, vomiting, diarrhea, abdominal pain.  Patient states she has been having issues with her left arm,.previously diagnosed with carpal tunnel.  This is been bothering her for a while.  Today she was at work and driving home when she started to feel poorly.  She states she developed epigastric pain and felt very hot.  Since then, she has had multiple episodes of diarrhea and vomiting.  She reports she is vomiting bile and having water like diarrhea.  She denies blood in her stool or emesis.  She has been taking an increased number of NSAIDs recently due to generalized body pain.  She was prescribed famotidine earlier this month, but has not been taking it.  She does have a history of reflux, but does not take anything daily.  She states that 5 days ago, she had deceased urination and urinary odor.  She had some extra antibiotics at home which she is taking the past 4 days.  She does not know the name of them.  She denies fevers, chills, chest pain, shortness of breath.  She has stopped her Lipitor per her PCPs orders.  She denies sick contacts.  She denies recent travel.  Patient reports generalized abdominal pain.  No focal abdominal pain.  Nothing makes it better or worse.  She has not taken anything for her symptoms. No radiation of the pain. Per pt, pt with h/o 2 bowel obstructions requiring surgery.   HPI  Past Medical History:  Diagnosis Date  . Anxiety   . Bronchitis   . Diverticulitis   . Fibromyalgia   . GERD (gastroesophageal reflux disease)   . Headache   . Substance abuse ([place])    10 years ago ( cocaine)    Patient Active Problem List   Diagnosis Date Noted  . Complaints of total body  pain 12/20/2017  . Hyperlipidemia 11/14/2017  . Anxiety 11/13/2016  . 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 Claudication ([place]) 09/03/2016  . Soreness breast 08/10/2016  . TIA (transient ischemic attack) 08/10/2016  . History of cervical dysplasia 01/18/2016  . Diabetes ([place]) 04/17/2015  . Healthcare maintenance 04/17/2015  . Tobacco use disorder 04/17/2015  . Fibromyalgia 06/18/2014  . Preventative health care 01/21/2014  . Goiter 02/28/2013  . Tobacco abuse 01/18/2012  . FIBROIDS, UTERUS 03/09/2010  . DEPRESSION 12/26/2006  . GERD 12/26/2006    Past Surgical History:  Procedure Laterality Date  . ANKLE SURGERY    . BACK SURGERY    . Bowel obstruction    . CERVICAL CONIZATION W/BX N/A 12/27/2015   Procedure: CONIZATION CERVIX WITH BIOPSY;  Surgeon: Terrance Mass, MD;  Location: [facility];  Service: Gynecology;  Laterality: N/A;  . DILATATION & CURETTAGE/HYSTEROSCOPY WITH MYOSURE N/A 12/27/2015   Procedure: DILATATION & CURETTAGE/HYSTEROSCOPY WITH MYOSURE;  Surgeon: Terrance Mass, MD;  Location: [facility];  Service: Gynecology;  Laterality: N/A;  . ECTOPIC PREGNANCY SURGERY    . EYE SURGERY     removed right eye;      OB History    Gravida  6   Para  2   Term  2   Preterm      AB  4   Living  2     SAB  2   TAB  1   Ectopic  1   Multiple      Live Births               Home Medications    Prior to Admission medications   Medication Sig Start Date End Date Taking? Authorizing Provider  acetaminophen (TYLENOL) 325 MG tablet Take 325-975 mg by mouth 2 (two) times daily as needed for mild pain.    Yes [provider]  albuterol (PROVENTIL HFA;VENTOLIN HFA) 108 (90 Base) MCG/ACT inhaler Inhale 2 puffs into the lungs every 6 (six) hours as needed for wheezing or shortness of breath. 09/24/17  Yes Nicolette Bang, DO  Calcium Carbonate-Simethicone (ALKA-SELTZER HEARTBURN + GAS) 750-80 MG CHEW Chew 1 tablet by mouth 2 (two) times daily  as needed (heartburn).   Yes [provider]  Cholecalciferol (D3-1000) 1000 units tablet Take 1,000 Units by mouth daily. Reported on 12/26/2015   Yes [provider]  cyclobenzaprine (FLEXERIL) 5 MG tablet Take 1 tablet (5 mg total) by mouth 3 (three) times daily as needed for muscle spasms. 12/13/17  Yes Quintella Reichert, MD  fluticasone [facility]) 50 MCG/ACT nasal spray Place 1 spray into both nostrils daily. 1 spray in each nostril every day 11/14/17  Yes Carlyle Dolly, MD  loratadine (ALLERGY) 10 MG tablet Take 10 mg by mouth daily as needed for allergies.   Yes [provider]  Menthol-Methyl Salicylate (MUSCLE RUB) 10-15 % CREA Apply 1 application topically as needed for muscle pain. Reported on 12/26/2015   Yes [provider]  naproxen (NAPROSYN) 500 MG tablet Take 1 tablet (500 mg total) by mouth 2 (two) times daily with a meal. 12/13/17  Yes Quintella Reichert, MD  VOLTAREN 1 % GEL APPLY 2 G TOPICALLY DAILY AS NEEDED FOR MUSCLE PAIN 11/14/17  Yes Carlyle Dolly, MD  atorvastatin (LIPITOR) 40 MG tablet Take 1 tablet (40 mg total) by mouth daily. Patient not taking: Reported on 12/27/2017 11/14/17   Carlyle Dolly, MD  famotidine (PEPCID) 20 MG tablet Take 1 tablet (20 mg total) by mouth 2 (two) times daily. 12/13/17   Quintella Reichert, MD  lidocaine (LIDODERM) 5 % Place 1 patch onto the skin daily. Remove & Discard patch within 12 hours or as directed by MD 12/27/17   Neave Lenger, PA-C  varenicline (CHANTIX STARTING MONTH PAK) 0.5 MG X 11 & 1 MG X 42 tablet Take one 0.5 mg tablet by mouth once daily for 3 days, then increase to one 0.5 mg tablet twice daily for 4 days, then increase to one 1 mg tablet twice daily. 10/18/17   Everrett Coombe, MD    Family History Family History  Problem Relation Age of Onset  . Cancer Father        lung  . Hypertension Mother   . Arthritis Mother   . Other Neg Hx        pituitary problem    Social History Social  History   Tobacco Use  . Smoking status: Current Every Day Smoker    Packs/day: 0.50    Years: 18.00    Pack years: 9.00    Types: Cigarettes  . Smokeless tobacco: Never Used  . Tobacco comment: 1-800 number given   Substance Use Topics  . Alcohol use: Yes    Alcohol/week: 0.6 oz    Types: 1 Glasses of wine per week  Comment: OCCASIONAL  . Drug use: No    Comment: clean 10 years crack cocaine     Allergies   Zanaflex [tizanidine hcl]; Penicillins; and Sulfonamide derivatives   Review of Systems Review of Systems  Gastrointestinal: Positive for abdominal pain, diarrhea, nausea and vomiting.  Genitourinary: Positive for decreased urine volume (resolved).  Musculoskeletal: Positive for myalgias (for months).  All other systems reviewed and are negative.    Physical Exam Updated Vital Signs BP (!) 143/67   Pulse 75   Temp 98.5 F (36.9 C) (Oral)   Resp (!) 22   Ht 5\' 4"  (1.626 m)   Wt 100.7 kg (222 lb)   SpO2 98%   BMI 38.11 kg/m   Physical Exam  Constitutional: She is oriented to person, place, and time. She appears well-developed and well-nourished. No distress.  Pt appears in no distress  HENT:  Head: Normocephalic and atraumatic.  MM moist  Eyes: Pupils are equal, round, and reactive to light. Conjunctivae and EOM are normal.  Neck: Normal range of motion. Neck supple.  Cardiovascular: Normal rate, regular rhythm and intact distal pulses.  Pulmonary/Chest: Effort normal and breath sounds normal. No respiratory distress. She has no wheezes.  Abdominal: Soft. Bowel sounds are normal. She exhibits no distension and no mass. There is tenderness. There is no rebound and no guarding.  Obese female.  Generalized tenderness palpation of the abdomen.  Soft without rigidity, guarding or distention.  Negative rebound.  Negative Murphy's.  Musculoskeletal: Normal range of motion.  Neurological: She is alert and oriented to person, place, and time.  Skin: Skin is warm  and dry. Capillary refill takes less than 2 seconds.  Psychiatric: She has a normal mood and affect.  Nursing note and vitals reviewed.    ED Treatments / Results  Labs (all labs ordered are listed, but only abnormal results are displayed) Labs Reviewed  CK - Abnormal; Notable for the following components:      Result Value   Total CK 317 (*)    All other components within normal limits  HEPATIC FUNCTION PANEL - Abnormal; Notable for the following components:   Bilirubin, Direct 0.6 (*)    All other components within normal limits  URINALYSIS, ROUTINE W REFLEX MICROSCOPIC - Abnormal; Notable for the following components:   Hgb urine dipstick SMALL (*)    Ketones, ur 5 (*)    All other components within normal limits  CBC  BASIC METABOLIC PANEL  LIPASE, BLOOD  I-STAT TROPONIN, ED  I-STAT BETA HCG BLOOD, ED ([facility], [facility], [facility] ONLY)    EKG EKG Interpretation  Date/Time:  Friday December 27 2017 16:18:23 EDT Ventricular Rate:  69 PR Interval:  148 QRS Duration: 100 QT Interval:  438 QTC Calculation: 470 R Axis:   -36 Text Interpretation:  Sinus rhythm Probable left atrial enlargement Abnormal R-wave progression, early transition Left ventricular hypertrophy previous prolonged QTc resolved  Confirmed by Wandra Arthurs [phone]) on 12/27/2017 4:30:05 PM   Radiology Dg Chest 2 View  Result Date: 12/27/2017 CLINICAL DATA:  Chest pain and shortness of breath today. Numbness in the left arm. EXAM: CHEST - 2 VIEW COMPARISON:  PA and lateral chest 11/26/2016. FINDINGS: The lungs are clear. Heart size is normal. No pneumothorax or pleural fluid. No acute or focal bony abnormality. IMPRESSION: Negative chest. Electronically Signed   By: Inge Rise M.D.   On: 12/27/2017 12:24    Procedures Procedures (including critical care time)  Medications Ordered in ED Medications  metoCLOPramide (REGLAN) tablet 10 mg (10 mg Oral Given 12/27/17 2221)  sodium chloride 0.9 % bolus 1,000 mL (0 mLs  Intravenous Stopped 12/27/17 1831)  famotidine (PEPCID) IVPB 20 mg premix (0 mg Intravenous Stopped 12/27/17 1928)  promethazine (PHENERGAN) injection 25 mg (25 mg Intravenous Given 12/27/17 1830)  gi cocktail (Maalox,Lidocaine,Donnatal) (30 mLs Oral Given 12/27/17 1831)  ketorolac (TORADOL) 15 MG/ML injection 15 mg (15 mg Intravenous Given 12/27/17 1928)  metoCLOPramide (REGLAN) injection 10 mg (10 mg Intravenous Given 12/27/17 2148)     Initial Impression / Assessment and Plan / ED Course  I have reviewed the triage vital signs and the nursing notes.  Pertinent labs & imaging results that were available during my care of the patient were reviewed by me and considered in my medical decision making (see chart for details).     Patient presenting for evaluation of nausea, vomiting, diarrhea, and abdominal pain.  Physical exam reassuring, patient is afebrile not tachycardic.  Appears nontoxic.  Generalized abdominal pain, no focal pain.  As patient is also having diarrhea, doubt bowel obstruction.  Patient with increased NSAID use recently, and taking unknown antibiotic for the past several days.  Likely contributors to patient's symptoms.  Labs reassuring, lipase negative, liver function reassuring, no leukocytosis.  Troponin negative.  CK mildly elevated, similar to previous.  Urine pending.  Fluids started and symptoms treated. Initial ekg concerning for possible qt prolongation, repeat ekg shows resolution. Discussed with Dr. Darl Householder, who evaluated the pt.   On reevaluation, patient reports no further vomiting.  Urine pending.  Patient reports improvement of abdominal symptoms, reports generalized pain.  Will give dose of Toradol and reassess.  Urine negative for infection.  Patient reports her nausea has returned, will give Reglan and reassess.  Patient reports improvement of nausea with Reglan.  Discussed cessation of NSAID use, and likely etiology of her symptoms.  At this time, surgical abdomen,  obstruction, or perforation.  I do not believe CT scan is necessary at this time.  Low suspicion for perforation.  Discussed importance of taking famotidine as prescribed.  Follow-up with PCP for generalized body pain as well as reassessment of her abdominal symptoms.  At this time, patient appears safe for discharge.  Return precautions given.  Patient states she understands and agrees plan.   Final Clinical Impressions(s) / ED Diagnoses   Final diagnoses:  Nausea vomiting and diarrhea  Acute generalized abdominal pain    ED Discharge Orders        Ordered    lidocaine (LIDODERM) 5 %  Every 24 hours     12/27/17 2158       Franchot Heidelberg, PA-C 12/27/17 2321    Drenda Freeze, MD 12/27/17 2329

## 2017-12-27 NOTE — ED Notes (Signed)
ED Provider at bedside. 

## 2017-12-27 NOTE — Telephone Encounter (Signed)
Contacted pt and gave her the below message and she stated that something is wrong and she just wants to know what it is.  She said that she is in a lot of pain and that she is going to ED in hopes they will tell her what is causing the pain.  Told pt that if she feels like she needs to be seen here before the 29th to contact us and we would get her an appointment. Katharina Caper, April D, [place]

## 2018-01-06 ENCOUNTER — Ambulatory Visit: Payer: Self-pay | Admitting: Student in an Organized Health Care Education/Training Program

## 2018-01-24 ENCOUNTER — Telehealth: Payer: Self-pay | Admitting: Student in an Organized Health Care Education/Training Program

## 2018-01-24 NOTE — Telephone Encounter (Signed)
Pt would like to know if Dr. Burr Medico could please send a referral to [facility] for the issues she has been seen for concerning her arm and hand. She states that she is not getting any results but that she knows something is wrong. She would also like for Dr. Burr Medico to give her a call.

## 2018-01-26 NOTE — Telephone Encounter (Signed)
I do not think a neurosurgery referral is appropriate based on previous evaluations. Patient needs to be seen for another appointment. She no showed her last appointment with me. She has an appointment scheduled for 8/19 and can be reevaluated at that time.

## 2018-01-27 ENCOUNTER — Ambulatory Visit (INDEPENDENT_AMBULATORY_CARE_PROVIDER_SITE_OTHER): Payer: Medicare Other | Admitting: Family Medicine

## 2018-01-27 ENCOUNTER — Other Ambulatory Visit: Payer: Self-pay

## 2018-01-27 ENCOUNTER — Encounter: Payer: Self-pay | Admitting: Family Medicine

## 2018-01-27 ENCOUNTER — Ambulatory Visit: Payer: Self-pay | Admitting: Family Medicine

## 2018-01-27 VITALS — BP 130/82 | HR 70 | Temp 98.1°F | Wt 217.0 lb

## 2018-01-27 DIAGNOSIS — G5602 Carpal tunnel syndrome, left upper limb: Secondary | ICD-10-CM

## 2018-01-27 HISTORY — DX: Carpal tunnel syndrome, left upper limb: G56.02

## 2018-01-27 MED ORDER — PREDNISONE 10 MG PO TABS: 20.0000 mg | ORAL_TABLET | Freq: Every day | ORAL | 0 refills | Status: AC

## 2018-01-27 NOTE — Progress Notes (Signed)
    Subjective:  Alyssa Montgomery is a 57 y.o. female who presents to the [facility] today with a chief complaint of L hand/arm numbness.   HPI:  L arm/hand numbness for the last 1 month. Woke up one day and L hand was numb. Was seen for this urgent care and was told that had carpal tunnel. Was given a brace which initially help but has been getting worse, wakes her up at hand. Is always flicking her hand down Also has diffuse pain in both shoulders and both hips down her legs.    ROS: Per HPI  Objective:  Physical Exam: BP 130/82   Pulse 70   Temp 98.1 F (36.7 C) (Oral)   Wt 217 lb (98.4 kg)   SpO2 97%   BMI 37.25 kg/m   Gen: NAD, resting comfortably Pulm: NWOB on room air  MSK: L hand with decreased sensation to palpation. Positive phalen. Negative tinel.  Skin: warm, dry   Assessment/Plan:  Left carpal tunnel syndrome Has failed conservative management with brace. Trial of prednisone 20mg  for 1 week to symptomatic improvement. Referral to neurology for nerve testing placed today.   Bufford Lope, DO PGY-3, [facility] 01/27/2018 11:38 AM

## 2018-01-27 NOTE — Patient Instructions (Addendum)
Referral to neurology placed today for nerve testing. Someone should call you, let us know if you have not heard about an appointment in the next 1-2 weeks.  Prednisone 20mg  with breakfast for 1 week.

## 2018-01-27 NOTE — Assessment & Plan Note (Signed)
Has failed conservative management with brace. Trial of prednisone 20mg  for 1 week to symptomatic improvement. Referral to neurology for nerve testing placed today.

## 2018-01-28 ENCOUNTER — Ambulatory Visit: Payer: Medicare Other | Admitting: Obstetrics & Gynecology

## 2018-01-28 DIAGNOSIS — Z0289 Encounter for other administrative examinations: Secondary | ICD-10-CM

## 2018-02-19 DIAGNOSIS — Z23 Encounter for immunization: Secondary | ICD-10-CM | POA: Diagnosis not present

## 2018-03-03 ENCOUNTER — Ambulatory Visit (INDEPENDENT_AMBULATORY_CARE_PROVIDER_SITE_OTHER): Payer: Medicare Other | Admitting: Neurology

## 2018-03-03 ENCOUNTER — Encounter: Payer: Self-pay | Admitting: Neurology

## 2018-03-03 ENCOUNTER — Encounter

## 2018-03-03 DIAGNOSIS — G5603 Carpal tunnel syndrome, bilateral upper limbs: Secondary | ICD-10-CM | POA: Diagnosis not present

## 2018-03-03 HISTORY — DX: Carpal tunnel syndrome, bilateral upper limbs: G56.03

## 2018-03-03 NOTE — Progress Notes (Signed)
Please refer to EMG and nerve conduction procedure note.  

## 2018-03-03 NOTE — Procedures (Signed)
     HISTORY:  Michaela Broski is a 57 year old patient with a 6-week history of numbness and discomfort in the hands, left greater than right.  The patient has bilateral shoulder discomfort, right greater than left.  The patient is being evaluated for a possible neuropathy or a cervical radiculopathy.  NERVE CONDUCTION STUDIES:  Nerve conduction studies were performed on both upper extremities.  The distal motor latencies for the median nerves were prolonged bilaterally with low motor amplitudes for these nerves bilaterally.  The nerve conduction velocities for the median nerves were normal bilaterally.  The distal motor latency and motor amplitude for the left ulnar nerve was normal, the nerve conduction velocities for this nerve were normal.  The sensory latencies for the median nerves were unobtainable on the left, prolonged on the right, and normal for the left ulnar nerve.  The left ulnar F-wave latency was normal.  EMG STUDIES:  EMG study was performed on the left upper extremity:  The first dorsal interosseous muscle reveals 2 to 4 K units with full recruitment. No fibrillations or positive waves were noted. The abductor pollicis brevis muscle reveals 2 to 4 K units with slightly reduced recruitment. No fibrillations or positive waves were noted. The extensor indicis proprius muscle reveals 1 to 3 K units with full recruitment. No fibrillations or positive waves were noted. The pronator teres muscle reveals 2 to 3 K units with full recruitment. No fibrillations or positive waves were noted. The biceps muscle reveals 1 to 2 K units with full recruitment. No fibrillations or positive waves were noted. The triceps muscle reveals 2 to 4 K units with full recruitment. No fibrillations or positive waves were noted. The anterior deltoid muscle reveals 2 to 3 K units with full recruitment. No fibrillations or positive waves were noted. The cervical paraspinal muscles were tested at 2 levels. No  abnormalities of insertional activity were seen at either level tested. There was poor relaxation.   IMPRESSION:  Nerve conduction studies done on both upper extremities shows evidence of bilateral carpal tunnel syndrome of mild to moderate severity bilaterally.  EMG evaluation of the left upper extremity shows no evidence of an overlying cervical radiculopathy.  Jill Alexanders MD 03/03/2018 3:44 PM  [facility] [address] [place], [place] [address]  Phone [phone] Fax [phone]

## 2018-03-05 NOTE — Progress Notes (Signed)
[place]    Nerve / Sites Muscle Latency Ref. Amplitude Ref. Rel Amp Segments Distance Velocity Ref. Area    ms ms mV mV %  cm m/s m/s mVms  L Median - APB     Wrist APB 6.9 ?4.4 3.3 ?4.0 100 Wrist - APB 7   10.6     Upper arm APB 11.6  2.9  87.6 Upper arm - Wrist 23 49 ?49 9.5  R Median - APB     Wrist APB 5.0 ?4.4 0.7 ?4.0 100 Wrist - APB 7   1.9     Upper arm APB 8.8  0.7  106 Upper arm - Wrist 21 55 ?49 2.2  L Ulnar - ADM     Wrist ADM 2.4 ?3.3 8.6 ?6.0 100 Wrist - ADM 7   27.1     B.Elbow ADM 6.2  7.6  89.4 B.Elbow - Wrist 20 53 ?49 25.7     A.Elbow ADM 7.9  7.1  92.4 A.Elbow - B.Elbow 10 58 ?49 22.4         A.Elbow - Wrist               SNC    Nerve / Sites Rec. Site Peak Lat Ref.  Amp Ref. Segments Distance    ms ms V V  cm  L Median - Orthodromic (Dig II, Mid palm)     Dig II Wrist NR ?3.4 NR ?10 Dig II - Wrist 13  R Median - Orthodromic (Dig II, Mid palm)     Dig II Wrist 4.0 ?3.4 3 ?10 Dig II - Wrist 13  L Ulnar - Orthodromic, (Dig V, Mid palm)     Dig V Wrist 2.9 ?3.1 6 ?5 Dig V - Wrist 26                   F  Wave    Nerve F Lat Ref.   ms ms  L Ulnar - ADM 24.4 ?32.0

## 2018-03-06 ENCOUNTER — Telehealth: Payer: Self-pay | Admitting: Student in an Organized Health Care Education/Training Program

## 2018-03-06 ENCOUNTER — Other Ambulatory Visit: Payer: Self-pay | Admitting: Student in an Organized Health Care Education/Training Program

## 2018-03-06 DIAGNOSIS — G5602 Carpal tunnel syndrome, left upper limb: Secondary | ICD-10-CM

## 2018-03-06 NOTE — Telephone Encounter (Signed)
Called patient and LVM. Next step for her bilateral carpal tunnel syndrome is referral to orthopedics. Referral placed. Patient can call back with any questions about this.

## 2018-03-06 NOTE — Telephone Encounter (Signed)
Pt is calling concerning the appointment she had at [facility] on 03/03/18. She says they gave her results but she is not sure as to what she needs to do next. She was told the results would be sent here and she should her from Dr. Burr Medico. She would like for someone to call her.  The best contact number is [phone].

## 2018-03-07 IMAGING — CR DG LUMBAR SPINE COMPLETE 4+V
5 series · 5 of 5 positions shown · non-contrast
Comparison: 05/05/2011 CT.

CLINICAL DATA: 55-year-old female with low back strain after injury
06/18/2016. Someone fell on patient at work. Left foot numbness.
Initial encounter.

EXAM:
LUMBAR SPINE - COMPLETE 4+ VIEW

[t l-spine a.p.]
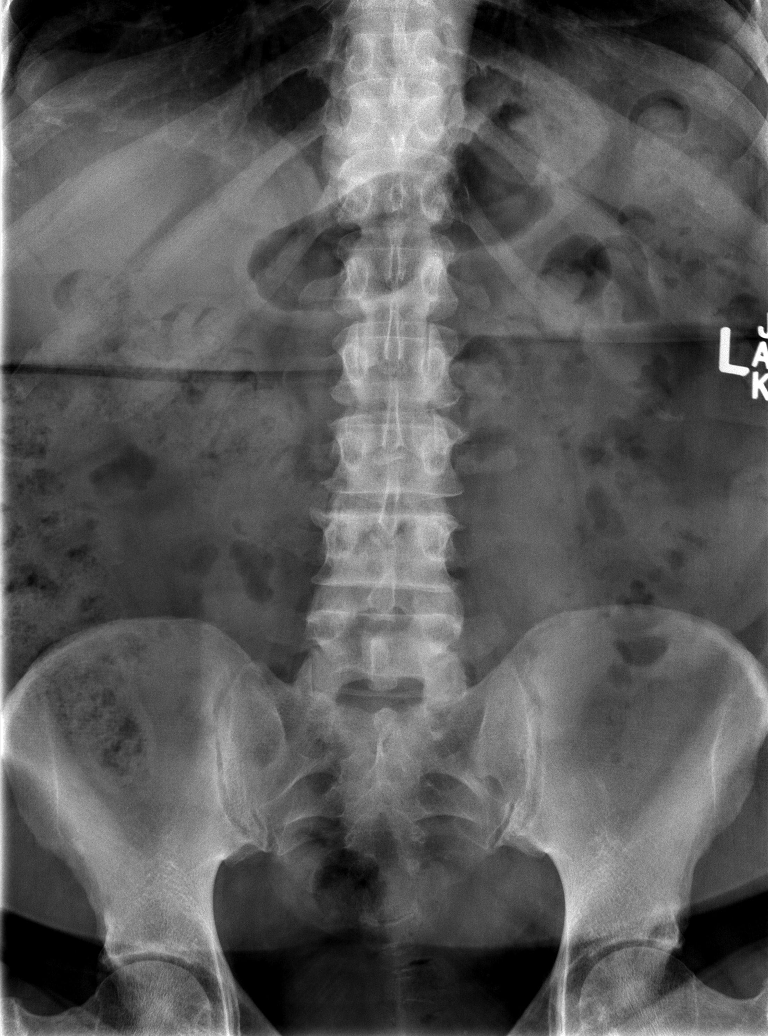

[t l-spine oblique exposure (1 of 2)]
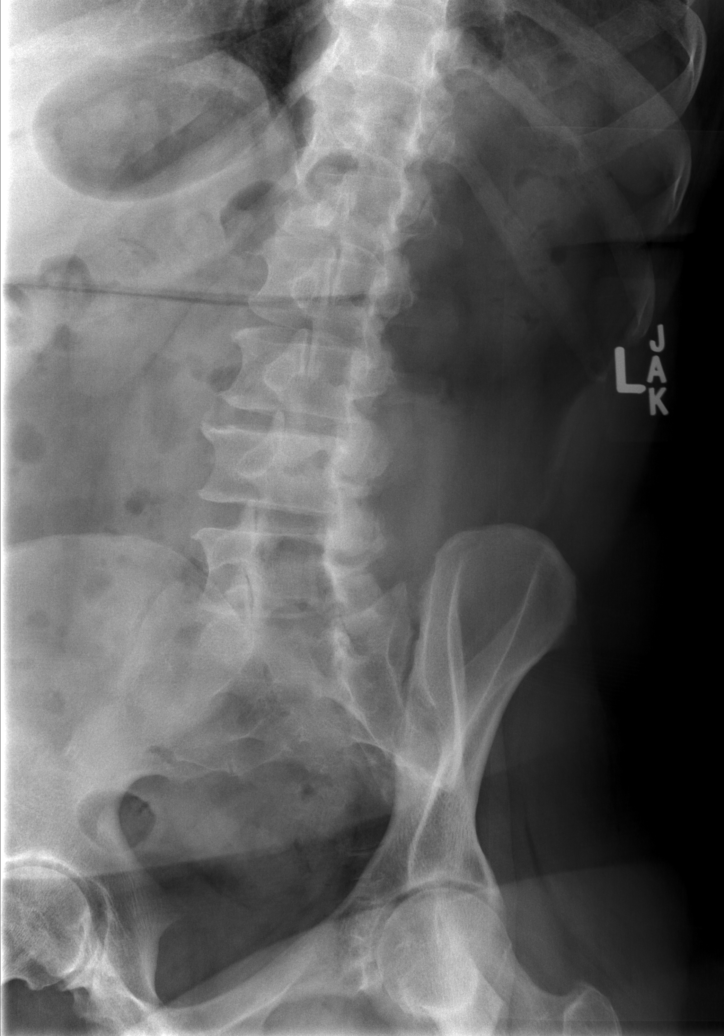

[t l-spine oblique exposure (2 of 2)]
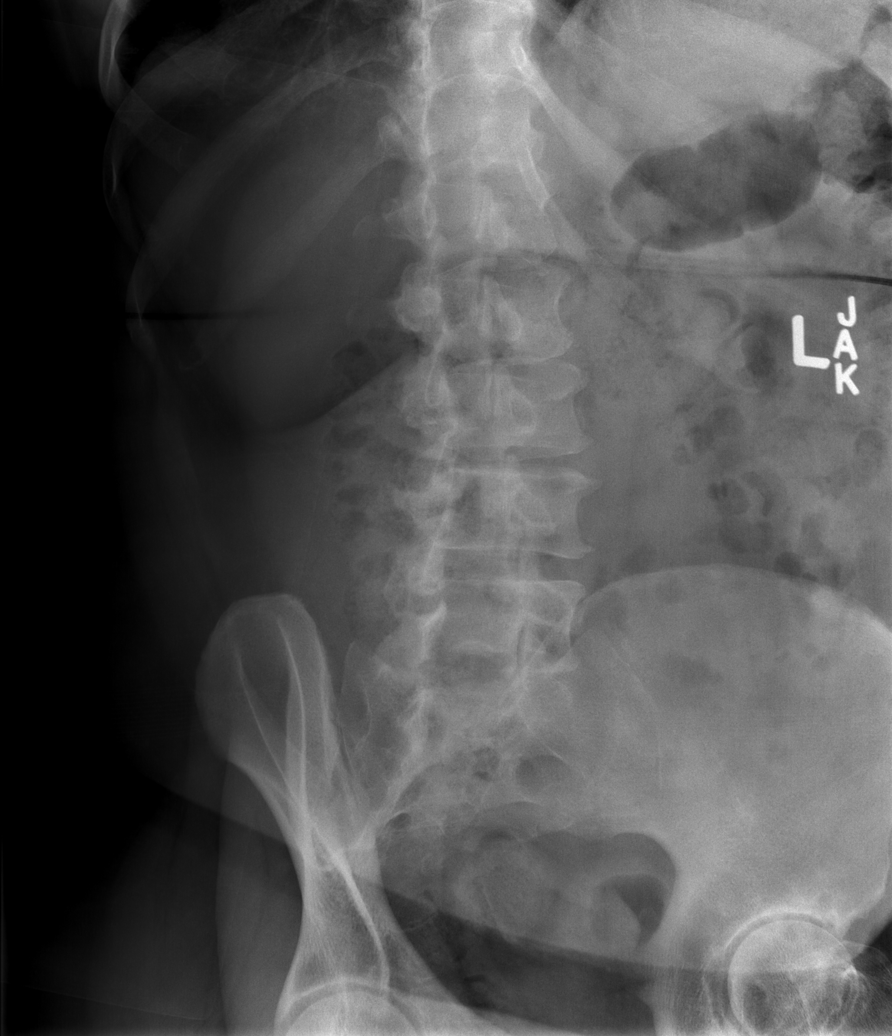

[t l-spine lat]
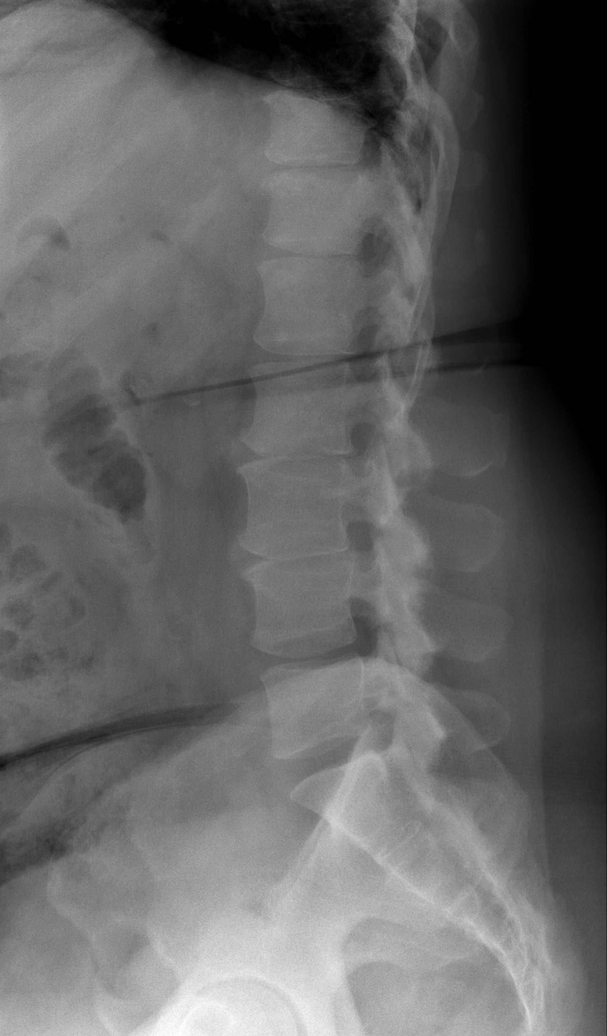

[t l-spine l5-s1 spot]
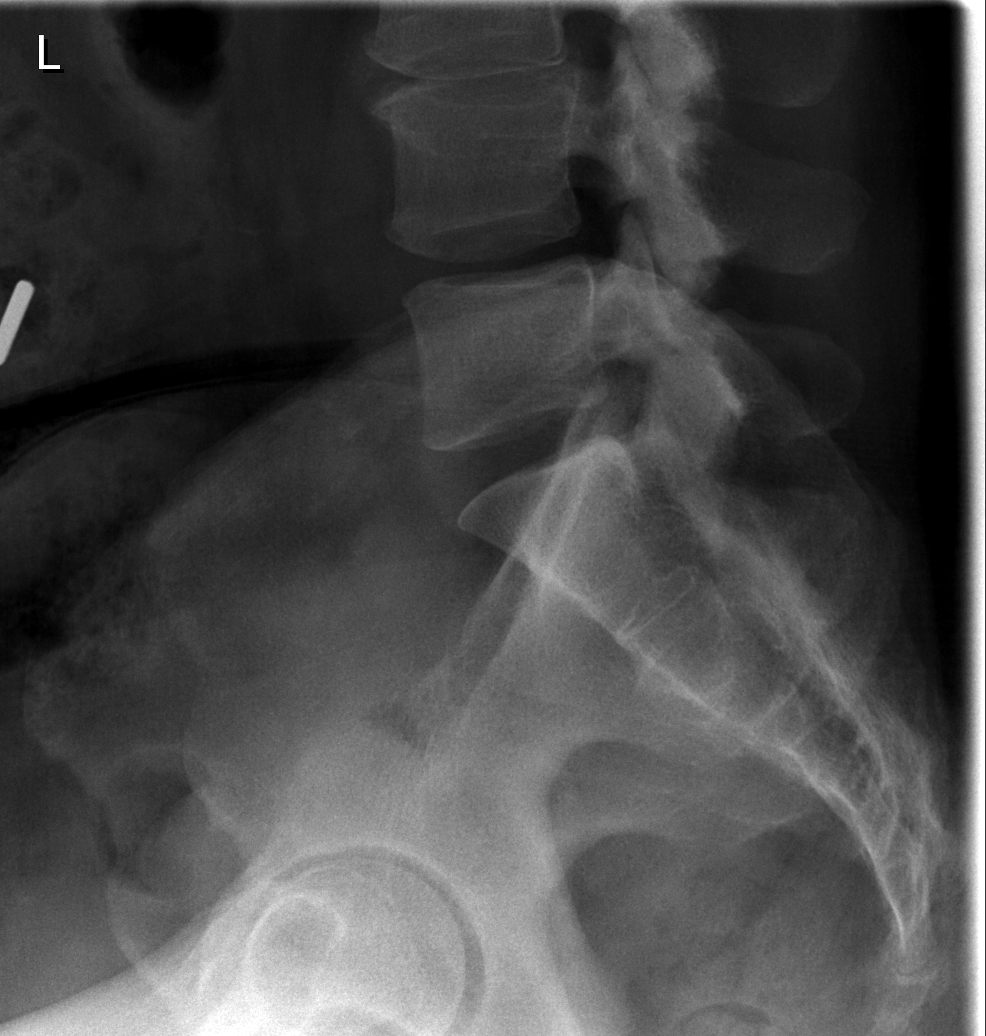

[5 of 5 positions shown; findings below may reference images not displayed]

FINDINGS: Straightening of the lumbar spine.

No fracture noted.

Mild degenerative changes lower thoracic spine.

Mild L2-3 and L3-4 disc space narrowing.

No pars defect noted.

Bilateral hip degenerative changes.
IMPRESSION: Straightening of the lumbar spine.

No fracture noted.

Mild degenerative changes lower thoracic spine.

Mild L2-3 and L3-4 disc space narrowing.

## 2018-03-12 ENCOUNTER — Telehealth: Payer: Self-pay | Admitting: Student in an Organized Health Care Education/Training Program

## 2018-03-12 NOTE — Telephone Encounter (Signed)
Dr. Burr Medico printed and handed me the information for the Alyssa Montgomery. I spoke to Alyssa Montgomery to let her know the information would be left up front for her to pick up. Ottis Stain, CMA

## 2018-03-12 NOTE — Telephone Encounter (Signed)
Pt called and would like to know if someone could have a copy made of the note her doctor at [facility] sent Dr. Burr Medico concerning her appointment and needing to be seen by their office. Her employer needs to have it as "a form of proof". Pt would like someone to call her when this is ready to be picked up at the front. I made her aware that this could take a week but she would like to have it asap.

## 2018-03-24 ENCOUNTER — Ambulatory Visit (INDEPENDENT_AMBULATORY_CARE_PROVIDER_SITE_OTHER): Payer: Medicare Other | Admitting: Orthopaedic Surgery

## 2018-03-24 ENCOUNTER — Encounter (INDEPENDENT_AMBULATORY_CARE_PROVIDER_SITE_OTHER): Payer: Self-pay | Admitting: Orthopaedic Surgery

## 2018-03-24 VITALS — BP 117/71 | HR 74 | Resp 16 | Ht 65.0 in | Wt 215.0 lb

## 2018-03-24 DIAGNOSIS — G5602 Carpal tunnel syndrome, left upper limb: Secondary | ICD-10-CM | POA: Diagnosis not present

## 2018-03-24 MED ORDER — LIDOCAINE 5 % EX PTCH: 1.0000 | MEDICATED_PATCH | CUTANEOUS | 0 refills | Status: DC

## 2018-03-24 NOTE — Progress Notes (Signed)
Office Visit Note   Patient: Alyssa Montgomery           Date of Birth: 1960-06-17           MRN: [id number] Visit Date: 03/24/2018              Requested by: Everrett Coombe, MD [address] [place], [place] [address] PCP: Everrett Coombe, MD   Assessment & Plan: Visit Diagnoses:  1. Carpal tunnel syndrome, left upper limb     Plan: Left carpal tunnel syndrome.  Long discussion over 45 minutes regarding the diagnosis that has been established via nerve conduction studies and EMGs.  Discussed in detail the different treatment options.  Alyssa Montgomery would like to proceed with surgical decompression.  I talked about potential pain relief.  She will continue with her volar wrist splint.  I will order Lidoderm patches  Follow-Up Instructions: Return will schedule surgery.   Orders:  No orders of the defined types were placed in this encounter.  No orders of the defined types were placed in this encounter.     Procedures: No procedures performed   Clinical Data: No additional findings.   Subjective: Chief Complaint  Patient presents with  . Right Wrist - Pain  . Left Wrist - Pain  . Wrist Pain    BIL CTS, discuss surgery  Alyssa Montgomery is 57 years old visited the office with a many month history of bilateral wrist pain.  She is having more trouble on the left than the right.  Her primary care physician recently referred her to [facility] neural logical office for EMGs and nerve conduction studies.  These demonstrated mild to moderate bilateral carpal tunnel syndrome.  She is been wearing a volar wrist splint and taking "lots of aspirin."  She continues to have trouble dropping objects and difficulty sleeping at night.  She was once told that she might have fibromyalgia and does note that she hurts "all over.  He has been experiencing some shoulder hand symptoms probably related to her left carpal tunnel HPI  Review of Systems  Constitutional: Positive for fatigue.  HENT: Negative for  trouble swallowing.   Eyes: Positive for pain.  Respiratory: Negative for shortness of breath.   Cardiovascular: Negative for leg swelling.  Gastrointestinal: Positive for constipation and diarrhea.  Endocrine: Positive for heat intolerance.  Genitourinary: Negative for difficulty urinating.  Musculoskeletal: Positive for back pain, neck pain and neck stiffness.  Skin: Negative for rash.  Allergic/Immunologic: Negative for food allergies.  Neurological: Positive for weakness and numbness.  Hematological: Does not bruise/bleed easily.  Psychiatric/Behavioral: Positive for sleep disturbance.     Objective: Vital Signs: BP 117/71 (BP Location: Right Arm, Patient Position: Sitting, Cuff Size: Normal)   Pulse 74   Resp 16   Ht 5\' 5"  (1.651 m)   Wt 215 lb (97.5 kg)   BMI 35.78 kg/m   Physical Exam  Constitutional: She is oriented to person, place, and time. She appears well-developed and well-nourished.  HENT:  Mouth/Throat: Oropharynx is clear and moist.  Eyes: Pupils are equal, round, and reactive to light. EOM are normal.  Pulmonary/Chest: Effort normal.  Neurological: She is alert and oriented to person, place, and time.  Skin: Skin is warm and dry.  Psychiatric: She has a normal mood and affect. Her behavior is normal.    Ortho Exam awake and alert and oriented x3.  Comfortable sitting.  Multiple areas of tenderness in her shoulders and her hips to pinpoint  palpation possibly consistent with fibromyalgia.  In terms of her left wrist she had a positive Tinel's and Phalen's about the wrist.  She does have some altered sensibility of the tips of the thumb index long and radial side of the ring finger.  Able to make a full grip.  Does have weakness holding her thumb to little finger compared to her right hand.  Good capillary refill.  No hand swelling.  Can make a full fist.  Specialty Comments:  No specialty comments available.  Imaging: No results found.   PMFS  History: Patient Active Problem List   Diagnosis Date Noted  . Bilateral carpal tunnel syndrome 03/03/2018  . Left carpal tunnel syndrome 01/27/2018  . Complaints of total body pain 12/20/2017  . Hyperlipidemia 11/14/2017  . Anxiety 11/13/2016  . 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 Claudication ([place]) 09/03/2016  . Soreness breast 08/10/2016  . TIA (transient ischemic attack) 08/10/2016  . History of cervical dysplasia 01/18/2016  . Diabetes ([place]) 04/17/2015  . Healthcare maintenance 04/17/2015  . Tobacco use disorder 04/17/2015  . Fibromyalgia 06/18/2014  . Preventative health care 01/21/2014  . Goiter 02/28/2013  . Tobacco abuse 01/18/2012  . FIBROIDS, UTERUS 03/09/2010  . DEPRESSION 12/26/2006  . GERD 12/26/2006   Past Medical History:  Diagnosis Date  . Anxiety   . Bilateral carpal tunnel syndrome 03/03/2018  . Bronchitis   . Diverticulitis   . Fibromyalgia   . GERD (gastroesophageal reflux disease)   . Headache   . Substance abuse ([place])    10 years ago ( cocaine)    Family History  Problem Relation Age of Onset  . Cancer Father        lung  . Hypertension Mother   . Arthritis Mother   . Other Neg Hx        pituitary problem    Past Surgical History:  Procedure Laterality Date  . ANKLE SURGERY    . BACK SURGERY    . Bowel obstruction    . CERVICAL CONIZATION W/BX N/A 12/27/2015   Procedure: CONIZATION CERVIX WITH BIOPSY;  Surgeon: Terrance Mass, MD;  Location: [facility];  Service: Gynecology;  Laterality: N/A;  . DILATATION & CURETTAGE/HYSTEROSCOPY WITH MYOSURE N/A 12/27/2015   Procedure: DILATATION & CURETTAGE/HYSTEROSCOPY WITH MYOSURE;  Surgeon: Terrance Mass, MD;  Location: [facility];  Service: Gynecology;  Laterality: N/A;  . ECTOPIC PREGNANCY SURGERY    . EYE SURGERY     removed right eye;   . EYE SURGERY     Social History   Occupational History  . Not on file  Tobacco Use  . Smoking status: Current Every Day Smoker    Packs/day: 0.50     Years: 18.00    Pack years: 9.00    Types: Cigarettes  . Smokeless tobacco: Never Used  . Tobacco comment: 1-800 number given   Substance and Sexual Activity  . Alcohol use: Yes    Alcohol/week: 0.0 standard drinks    Comment: HOLIDAYS  . 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 Sexual activity: Yes    Partners: Male    Birth control/protection: Post-menopausal

## 2018-03-25 ENCOUNTER — Telehealth (INDEPENDENT_AMBULATORY_CARE_PROVIDER_SITE_OTHER): Payer: Self-pay | Admitting: Orthopaedic Surgery

## 2018-03-25 NOTE — Telephone Encounter (Signed)
Left message on patient's voicemail, providing her with my name and direct number for scheduling her Left carpal tunnel release surgery.

## 2018-04-15 ENCOUNTER — Telehealth (INDEPENDENT_AMBULATORY_CARE_PROVIDER_SITE_OTHER): Payer: Self-pay | Admitting: Orthopaedic Surgery

## 2018-04-15 NOTE — Telephone Encounter (Signed)
Patient called stating she is filling out the paperwork for her disability and needs the ICD code for her procedure.  Patient requested a return call ASAP.

## 2018-04-17 ENCOUNTER — Encounter: Payer: Self-pay | Admitting: Orthopaedic Surgery

## 2018-04-17 DIAGNOSIS — M65842 Other synovitis and tenosynovitis, left hand: Secondary | ICD-10-CM | POA: Diagnosis not present

## 2018-04-17 DIAGNOSIS — G5602 Carpal tunnel syndrome, left upper limb: Secondary | ICD-10-CM | POA: Diagnosis not present

## 2018-04-17 DIAGNOSIS — M659 Synovitis and tenosynovitis, unspecified: Secondary | ICD-10-CM | POA: Diagnosis not present

## 2018-04-18 ENCOUNTER — Other Ambulatory Visit (INDEPENDENT_AMBULATORY_CARE_PROVIDER_SITE_OTHER): Payer: Self-pay | Admitting: Radiology

## 2018-04-18 ENCOUNTER — Telehealth (INDEPENDENT_AMBULATORY_CARE_PROVIDER_SITE_OTHER): Payer: Self-pay | Admitting: Orthopaedic Surgery

## 2018-04-18 MED ORDER — PROMETHAZINE HCL 25 MG PO TABS: ORAL_TABLET | ORAL | 0 refills | Status: DC

## 2018-04-18 NOTE — Telephone Encounter (Signed)
meds called in and notified pt

## 2018-04-18 NOTE — Telephone Encounter (Signed)
Patient called requesting a prescription of Phenergan be called into [facility].  Patient states the pain medication is making her "extremely nauseous."   Patient states that her hand is a little swollen today.

## 2018-04-21 ENCOUNTER — Ambulatory Visit (INDEPENDENT_AMBULATORY_CARE_PROVIDER_SITE_OTHER): Payer: Medicare Other | Admitting: Orthopaedic Surgery

## 2018-04-21 ENCOUNTER — Encounter (INDEPENDENT_AMBULATORY_CARE_PROVIDER_SITE_OTHER): Payer: Self-pay | Admitting: Orthopaedic Surgery

## 2018-04-21 ENCOUNTER — Inpatient Hospital Stay (INDEPENDENT_AMBULATORY_CARE_PROVIDER_SITE_OTHER): Payer: Medicare Other | Admitting: Orthopaedic Surgery

## 2018-04-21 VITALS — BP 152/101 | HR 85 | Ht 65.0 in | Wt 215.0 lb

## 2018-04-21 DIAGNOSIS — G5602 Carpal tunnel syndrome, left upper limb: Secondary | ICD-10-CM

## 2018-04-21 NOTE — Progress Notes (Signed)
Office Visit Note   Patient: Alyssa Montgomery           Date of Birth: 1960/06/19           MRN: [id number] Visit Date: 04/21/2018              Requested by: Everrett Coombe, MD [address] [place], [place] [address] PCP: Everrett Coombe, MD   Assessment & Plan: Visit Diagnoses:  1. Left carpal tunnel syndrome     Plan: 4 days status post left carpal tunnel release and doing well.  The nerve is quite compressed with a large volar carpal ligament.  Alyssa Montgomery can already tell a difference in her pain.  Wound is looking clean and dry.  Applied new dressing and will check her in 1 week to remove stitches  Follow-Up Instructions: Return in about 1 week (around 04/28/2018).   Orders:  No orders of the defined types were placed in this encounter.  No orders of the defined types were placed in this encounter.     Procedures: No procedures performed   Clinical Data: No additional findings.   Subjective: Chief Complaint  Patient presents with  . Follow-up    L CARPAL TUNNEL WRIST SURGERY  4 days status post left carpal tunnel release and doing quite well.  Can already tell a difference in her pain.  Still having some numbness in her radial 3 digits but more of a "tingling than this.  No fever or chills.  HPI  Review of Systems  Constitutional: Negative for fatigue and fever.  HENT: Negative for ear pain.   Eyes: Negative for pain.  Respiratory: Negative for cough and shortness of breath.   Cardiovascular: Negative for leg swelling.  Gastrointestinal: Negative for constipation and diarrhea.  Genitourinary: Negative for difficulty urinating.  Musculoskeletal: Negative for back pain and neck pain.  Skin: Negative for rash.  Allergic/Immunologic: Negative for food allergies.  Neurological: Positive for weakness and numbness.  Hematological: Does not bruise/bleed easily.  Psychiatric/Behavioral: Positive for sleep disturbance.     Objective: Vital Signs: Ht 5\' 5"  (1.651 m)    Wt 215 lb (97.5 kg)   BMI 35.78 kg/m   Physical Exam  Ortho Exam dressing removed from left hand.  Wound is clean and dry.  Cleaned with alcohol and a waterproof Band-Aid applied.  Will wear the volar wrist splint and check her again in a week.  Neurovascular exam intact.  Able to oppose thumb the little finger and make a full fist which she could not do before surgery.  Tingling in fingers but excellent capillary refill.  No swelling of any of the digits.  Specialty Comments:  No specialty comments available.  Imaging: No results found.   PMFS History: Patient Active Problem List   Diagnosis Date Noted  . Bilateral carpal tunnel syndrome 03/03/2018  . Left carpal tunnel syndrome 01/27/2018  . Complaints of total body pain 12/20/2017  . Hyperlipidemia 11/14/2017  . Anxiety 11/13/2016  . 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 Claudication ([place]) 09/03/2016  . Soreness breast 08/10/2016  . TIA (transient ischemic attack) 08/10/2016  . History of cervical dysplasia 01/18/2016  . Diabetes ([place]) 04/17/2015  . Healthcare maintenance 04/17/2015  . Tobacco use disorder 04/17/2015  . Fibromyalgia 06/18/2014  . Preventative health care 01/21/2014  . Goiter 02/28/2013  . Tobacco abuse 01/18/2012  . FIBROIDS, UTERUS 03/09/2010  . DEPRESSION 12/26/2006  . GERD 12/26/2006   Past Medical History:  Diagnosis Date  .  Anxiety   . Bilateral carpal tunnel syndrome 03/03/2018  . Bronchitis   . Diverticulitis   . Fibromyalgia   . GERD (gastroesophageal reflux disease)   . Headache   . Substance abuse ([place])    10 years ago ( cocaine)    Family History  Problem Relation Age of Onset  . Cancer Father        lung  . Hypertension Mother   . Arthritis Mother   . Other Neg Hx        pituitary problem    Past Surgical History:  Procedure Laterality Date  . ANKLE SURGERY    . BACK SURGERY    . Bowel obstruction    . CERVICAL CONIZATION W/BX N/A 12/27/2015   Procedure: CONIZATION  CERVIX WITH BIOPSY;  Surgeon: Terrance Mass, MD;  Location: [facility];  Service: Gynecology;  Laterality: N/A;  . DILATATION & CURETTAGE/HYSTEROSCOPY WITH MYOSURE N/A 12/27/2015   Procedure: DILATATION & CURETTAGE/HYSTEROSCOPY WITH MYOSURE;  Surgeon: Terrance Mass, MD;  Location: [facility];  Service: Gynecology;  Laterality: N/A;  . ECTOPIC PREGNANCY SURGERY    . EYE SURGERY     removed right eye;   . EYE SURGERY     Social History   Occupational History  . Not on file  Tobacco Use  . Smoking status: Current Every Day Smoker    Packs/day: 0.50    Years: 18.00    Pack years: 9.00    Types: Cigarettes  . Smokeless tobacco: Never Used  . Tobacco comment: 1-800 number given   Substance and Sexual Activity  . Alcohol use: Yes    Alcohol/week: 0.0 standard drinks    Comment: HOLIDAYS  . 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 Sexual activity: Yes    Partners: Male    Birth control/protection: Post-menopausal

## 2018-04-28 ENCOUNTER — Ambulatory Visit (INDEPENDENT_AMBULATORY_CARE_PROVIDER_SITE_OTHER): Payer: Medicare Other | Admitting: Orthopaedic Surgery

## 2018-04-29 ENCOUNTER — Ambulatory Visit (INDEPENDENT_AMBULATORY_CARE_PROVIDER_SITE_OTHER): Payer: Medicare Other | Admitting: Orthopaedic Surgery

## 2018-04-29 ENCOUNTER — Encounter (INDEPENDENT_AMBULATORY_CARE_PROVIDER_SITE_OTHER): Payer: Self-pay | Admitting: Orthopaedic Surgery

## 2018-04-29 VITALS — BP 142/95 | HR 75 | Ht 65.0 in | Wt 218.0 lb

## 2018-04-29 DIAGNOSIS — G5602 Carpal tunnel syndrome, left upper limb: Secondary | ICD-10-CM

## 2018-04-29 NOTE — Progress Notes (Signed)
Office Visit Note   Patient: Alyssa Montgomery           Date of Birth: 1960/07/01           MRN: [id number] Visit Date: 04/29/2018              Requested by: Everrett Coombe, MD [address] [place], [place] [address] PCP: Everrett Coombe, MD   Assessment & Plan: Visit Diagnoses:  1. Left carpal tunnel syndrome     Plan: 12 days status post left carpal tunnel release and doing well.  Has some burning around her incisions.  I remove the stitches and applied waterproof Band-Aid.  Some induration around the incision very minimal tenderness.  No longer has to wake up at night and shake her hands.  Feeling is returning to all of her digits.  Will wear the volar wrist splint work on range of motion exercises and return in 2 weeks.  No work  Follow-Up Instructions: Return in about 2 weeks (around 05/13/2018).   Orders:  No orders of the defined types were placed in this encounter.  No orders of the defined types were placed in this encounter.     Procedures: No procedures performed   Clinical Data: No additional findings.   Subjective: Chief Complaint  Patient presents with  . Follow-up    04/17/18 L CARPAL TUNNEL RELEASE STILL HAVING PAIN IN HAND  Definitely feeling better since surgery.  Has not had to awaken at night and shake her hand.  Has better feeling in her thumb and index finger.  Still having some numbness in the long finger.  HPI  Review of Systems  Constitutional: Negative for fatigue and fever.  HENT: Negative for ear pain.   Eyes: Negative for pain.  Respiratory: Negative for cough and shortness of breath.   Cardiovascular: Negative for leg swelling.  Gastrointestinal: Negative for constipation and diarrhea.  Genitourinary: Negative for difficulty urinating.  Musculoskeletal: Negative for back pain and neck pain.  Skin: Negative for rash.  Allergic/Immunologic: Negative for food allergies.  Neurological: Positive for weakness. Negative for numbness.    Hematological: Does not bruise/bleed easily.  Psychiatric/Behavioral: Positive for sleep disturbance.     Objective: Vital Signs: Ht 5\' 5"  (1.651 m)   Wt 218 lb (98.9 kg)   BMI 36.28 kg/m   Physical Exam  Ortho Exam little bit of induration around the incision.  I removed the stitches and applied a waterproof Band-Aid.  Good capillary refill to fingers.  Good opposition of thumb to little finger.  No wound drainage  Specialty Comments:  No specialty comments available.  Imaging: No results found.   PMFS History: Patient Active Problem List   Diagnosis Date Noted  . Bilateral carpal tunnel syndrome 03/03/2018  . Left carpal tunnel syndrome 01/27/2018  . Complaints of total body pain 12/20/2017  . Hyperlipidemia 11/14/2017  . Anxiety 11/13/2016  . 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 Claudication ([place]) 09/03/2016  . Soreness breast 08/10/2016  . TIA (transient ischemic attack) 08/10/2016  . History of cervical dysplasia 01/18/2016  . Diabetes ([place]) 04/17/2015  . Healthcare maintenance 04/17/2015  . Tobacco use disorder 04/17/2015  . Fibromyalgia 06/18/2014  . Preventative health care 01/21/2014  . Goiter 02/28/2013  . Tobacco abuse 01/18/2012  . FIBROIDS, UTERUS 03/09/2010  . DEPRESSION 12/26/2006  . GERD 12/26/2006   Past Medical History:  Diagnosis Date  . Anxiety   . Bilateral carpal tunnel syndrome 03/03/2018  . Bronchitis   .  Diverticulitis   . Fibromyalgia   . GERD (gastroesophageal reflux disease)   . Headache   . Substance abuse ([place])    10 years ago ( cocaine)    Family History  Problem Relation Age of Onset  . Cancer Father        lung  . Hypertension Mother   . Arthritis Mother   . Other Neg Hx        pituitary problem    Past Surgical History:  Procedure Laterality Date  . ANKLE SURGERY    . BACK SURGERY    . Bowel obstruction    . CARPAL TUNNEL RELEASE    . CERVICAL CONIZATION W/BX N/A 12/27/2015   Procedure: CONIZATION CERVIX  WITH BIOPSY;  Surgeon: Terrance Mass, MD;  Location: [facility];  Service: Gynecology;  Laterality: N/A;  . DILATATION & CURETTAGE/HYSTEROSCOPY WITH MYOSURE N/A 12/27/2015   Procedure: DILATATION & CURETTAGE/HYSTEROSCOPY WITH MYOSURE;  Surgeon: Terrance Mass, MD;  Location: [facility];  Service: Gynecology;  Laterality: N/A;  . ECTOPIC PREGNANCY SURGERY    . EYE SURGERY     removed right eye;   . EYE SURGERY     Social History   Occupational History  . Not on file  Tobacco Use  . Smoking status: Current Every Day Smoker    Packs/day: 0.50    Years: 18.00    Pack years: 9.00    Types: Cigarettes  . Smokeless tobacco: Never Used  . Tobacco comment: 1-800 number given   Substance and Sexual Activity  . Alcohol use: Yes    Alcohol/week: 0.0 standard drinks    Comment: HOLIDAYS  . 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 Sexual activity: Yes    Partners: Male    Birth control/protection: Post-menopausal

## 2018-05-05 ENCOUNTER — Ambulatory Visit (INDEPENDENT_AMBULATORY_CARE_PROVIDER_SITE_OTHER): Payer: Medicare Other | Admitting: Obstetrics & Gynecology

## 2018-05-05 ENCOUNTER — Encounter: Payer: Self-pay | Admitting: Obstetrics & Gynecology

## 2018-05-05 VITALS — BP 134/80 | Ht 65.0 in | Wt 219.0 lb

## 2018-05-05 DIAGNOSIS — E7800 Pure hypercholesterolemia, unspecified: Secondary | ICD-10-CM | POA: Diagnosis not present

## 2018-05-05 DIAGNOSIS — Z01419 Encounter for gynecological examination (general) (routine) without abnormal findings: Secondary | ICD-10-CM | POA: Diagnosis not present

## 2018-05-05 DIAGNOSIS — Z779 Other contact with and (suspected) exposures hazardous to health: Secondary | ICD-10-CM | POA: Diagnosis not present

## 2018-05-05 DIAGNOSIS — Z13820 Encounter for screening for osteoporosis: Secondary | ICD-10-CM

## 2018-05-05 DIAGNOSIS — Z124 Encounter for screening for malignant neoplasm of cervix: Secondary | ICD-10-CM | POA: Diagnosis not present

## 2018-05-05 DIAGNOSIS — Z780 Asymptomatic menopausal state: Secondary | ICD-10-CM | POA: Diagnosis not present

## 2018-05-05 DIAGNOSIS — E6609 Other obesity due to excess calories: Secondary | ICD-10-CM | POA: Diagnosis not present

## 2018-05-05 DIAGNOSIS — Z113 Encounter for screening for infections with a predominantly sexual mode of transmission: Secondary | ICD-10-CM | POA: Diagnosis not present

## 2018-05-05 DIAGNOSIS — Z6836 Body mass index (BMI) 36.0-36.9, adult: Secondary | ICD-10-CM

## 2018-05-05 NOTE — Progress Notes (Addendum)
Our patient presents for Breast and Pelvic Exam.      Alyssa Montgomery Mar 29, 1961 [id number]   History:    57 y.o. [id number] Married  RP:  Established patient presenting for annual gyn exam   HPI: Menopause, well on no hormone replacement therapy.  No postmenopausal bleeding.  No pelvic pain.  No pain with intercourse.  No abnormal vaginal discharge.  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  Urine and bowel movements normal.  Breasts normal.  Body mass index 36.44.  Lost 5 pounds since last year.  Mildly physically active.  Improved diet when got diagnosed with hypercholesterolemia.  Health labs with family physician.  Past medical history,surgical history, family history and social history were all reviewed and documented in the EPIC chart.  Gynecologic History No LMP recorded. Patient is postmenopausal. Contraception: post menopausal status Last Pap: 11/2016. Results were: ASCUS/HPV HR neg Last mammogram: 12/2017. Results were: Neg Bone Density: 02/2011 Colonoscopy: 03/2010  Obstetric History OB History  Gravida Para Term Preterm AB Living  6 2 2   4 2   SAB TAB Ectopic Multiple Live Births  2 1 1         # Outcome Date GA Lbr Len/2nd Weight Sex Delivery Anes PTL Lv  6 Term 03/24/89   8 lb 2 oz (3.685 kg) M Vag-Spont     5 Term 12/27/79   6 lb 10 oz (3.005 kg) F Vag-Spont     4 SAB           3 TAB           2 SAB           1 Ectopic              ROS: A ROS was performed and pertinent positives and negatives are included in the history.  GENERAL: No fevers or chills. HEENT: No change in vision, no earache, sore throat or sinus congestion. NECK: No pain or stiffness. CARDIOVASCULAR: No chest pain or pressure. No palpitations. PULMONARY: No shortness of breath, cough or wheeze. GASTROINTESTINAL: No abdominal pain, nausea, vomiting or diarrhea, melena or bright red blood per rectum. GENITOURINARY: No urinary frequency, urgency, hesitancy or dysuria. MUSCULOSKELETAL: No  joint or muscle pain, no back pain, no recent trauma. DERMATOLOGIC: No rash, no itching, no lesions. ENDOCRINE: No polyuria, polydipsia, no heat or cold intolerance. No recent change in weight. HEMATOLOGICAL: No anemia or easy bruising or bleeding. NEUROLOGIC: No headache, seizures, numbness, tingling or weakness. PSYCHIATRIC: No depression, no loss of interest in normal activity or change in sleep pattern.     Exam:   BP 134/80 (BP Location: Right Arm, Patient Position: Sitting, Cuff Size: Large)   Ht 5\' 5"  (1.651 m)   Wt 219 lb (99.3 kg)   BMI 36.44 kg/m   Body mass index is 36.44 kg/m.  General appearance : Well developed well nourished female. No acute distress HEENT: Eyes: no retinal hemorrhage or exudates,  Neck supple, trachea midline, no carotid bruits, no thyroidmegaly Lungs: Clear to auscultation, no rhonchi or wheezes, or rib retractions  Heart: Regular rate and rhythm, no murmurs or gallops Breast:Examined in sitting and supine position were symmetrical in appearance, no palpable masses or tenderness,  no skin retraction, no nipple inversion, no nipple discharge, no skin discoloration, no axillary or supraclavicular lymphadenopathy Abdomen: no palpable masses or tenderness, no rebound or guarding Extremities: no edema or skin discoloration or tenderness  Pelvic: Vulva: Normal  Vagina: No gross lesions or discharge  Cervix: No gross lesions or discharge.  Pap/HPV HR, Gono-Chlam  Uterus  AV, normal size, shape and consistency, non-tender and mobile  Adnexa  Without masses or tenderness  Anus: Normal   Assessment/Plan:  57 y.o. female for annual exam   1. Encounter for routine gynecological examination with Papanicolaou smear of cervix Normal gynecologic exam.  Pap with high-risk HPV done today.  Breast exam normal.  Screening mammogram was negative in July 2019.  Health labs with family physician.  Last colonoscopy October 2011.  2. Hypercholesteremia Not on  medical treatment currently.  Fasting lipid panel drawn today. - Lipid panel; Future  3. Postmenopausal Well on no hormone replacement therapy.  No post menopausal bleeding.  Recommend vitamin D supplements, calcium intake of 1.5 g/day and regular weightbearing physical activities.  4. Screen for STD (sexually transmitted disease) - Gono-Chlam on pap - HIV antibody (with reflex) - RPR - Hepatitis C Antibody - Hepatitis B Surface AntiGEN  5. Screening for osteoporosis Will schedule bone density here now.  Vitamin D supplements, calcium intake of 1.5 g/day, smoking cessation and regular weightbearing physical activity recommended. - DG Bone Density; Future  6. Class 2 obesity due to excess calories without serious comorbidity with body mass index (BMI) of 36.0 to 36.9 in adult Low calorie/low carb diet such as [employer] suggested.  Aerobic physical activity 5 times a week and weightlifting every 2 days.  Princess Bruins MD, 4:24 PM 05/05/2018

## 2018-05-05 NOTE — Patient Instructions (Signed)
1. Encounter for routine gynecological examination with Papanicolaou smear of cervix Normal gynecologic exam.  Pap with high-risk HPV done today.  Breast exam normal.  Screening mammogram was negative in July 2019.  Health labs with family physician.  Last colonoscopy October 2011.  2. Hypercholesteremia Not on medical treatment currently.  Fasting lipid panel drawn today. - Lipid panel; Future  3. Postmenopausal Well on no hormone replacement therapy.  No post menopausal bleeding.  4. Screen for STD (sexually transmitted disease) - Gono-Chlam on pap - HIV antibody (with reflex) - RPR - Hepatitis C Antibody - Hepatitis B Surface AntiGEN  5. Screening for osteoporosis Will schedule bone density here now.  Vitamin D supplements, calcium intake of 1.5 g/day, smoking cessation and regular weightbearing physical activity recommended. - DG Bone Density; Future  6. Class 2 obesity due to excess calories without serious comorbidity with body mass index (BMI) of 36.0 to 36.9 in adult Low calorie/low carb diet such as [employer] suggested.  Aerobic physical activity 5 times a week and weightlifting every 2 days.  [name], it was a pleasure meeting you today!  I will inform you of your results as soon as they are available.

## 2018-05-06 ENCOUNTER — Telehealth (INDEPENDENT_AMBULATORY_CARE_PROVIDER_SITE_OTHER): Payer: Self-pay | Admitting: Orthopaedic Surgery

## 2018-05-06 LAB — HEPATITIS C ANTIBODY
Hepatitis C Ab: NONREACTIVE
SIGNAL TO CUT-OFF: 0.01 (ref <1.00)

## 2018-05-06 LAB — RPR: RPR Ser Ql: NONREACTIVE

## 2018-05-06 LAB — HEPATITIS B SURFACE ANTIGEN: Hepatitis B Surface Ag: NONREACTIVE

## 2018-05-06 LAB — HIV ANTIBODY (ROUTINE TESTING W REFLEX): HIV 1&2 Ab, 4th Generation: NONREACTIVE

## 2018-05-06 NOTE — Telephone Encounter (Signed)
Patient wants disability paper work to say light duty, return to work part time on 05/26/18. Agree?

## 2018-05-06 NOTE — Telephone Encounter (Signed)
Patient called stating [facility] will be faxing another disability form for Dr. Durward Fortes to sign today.  Patient states the date the disability is to start was entered incorrectly (according to [facility]).  Patient is requesting Dr. Durward Fortes correct and sign the form and fax back to  [facility] today so she can be paid.  Patient requested a return call.

## 2018-05-06 NOTE — Telephone Encounter (Signed)
LMOM for patient, physicians part of disability faxed

## 2018-05-06 NOTE — Telephone Encounter (Signed)
ok 

## 2018-05-06 NOTE — Telephone Encounter (Signed)
I called patient, patient will come by office to complete patient's part of paper work.

## 2018-05-09 LAB — PAP IG, CT-NG NAA, HPV HIGH-RISK
C. trachomatis RNA, TMA: NOT DETECTED
HPV DNA High Risk: NOT DETECTED
N. gonorrhoeae RNA, TMA: NOT DETECTED

## 2018-05-12 ENCOUNTER — Encounter (INDEPENDENT_AMBULATORY_CARE_PROVIDER_SITE_OTHER): Payer: Self-pay | Admitting: Orthopaedic Surgery

## 2018-05-12 ENCOUNTER — Ambulatory Visit (INDEPENDENT_AMBULATORY_CARE_PROVIDER_SITE_OTHER): Payer: Medicare Other | Admitting: Orthopaedic Surgery

## 2018-05-12 ENCOUNTER — Other Ambulatory Visit: Payer: Medicare Other

## 2018-05-12 ENCOUNTER — Ambulatory Visit (INDEPENDENT_AMBULATORY_CARE_PROVIDER_SITE_OTHER): Payer: Medicare Other

## 2018-05-12 VITALS — BP 114/88 | HR 82 | Ht 65.0 in | Wt 219.0 lb

## 2018-05-12 DIAGNOSIS — M791 Myalgia, unspecified site: Secondary | ICD-10-CM

## 2018-05-12 DIAGNOSIS — G5602 Carpal tunnel syndrome, left upper limb: Secondary | ICD-10-CM

## 2018-05-12 DIAGNOSIS — M25511 Pain in right shoulder: Secondary | ICD-10-CM | POA: Diagnosis not present

## 2018-05-12 MED ORDER — METHYLPREDNISOLONE ACETATE 40 MG/ML IJ SUSP
80.0000 mg | INTRAMUSCULAR | Status: AC | PRN
  Administered 2018-05-12: 80 mg

## 2018-05-12 MED ORDER — LIDOCAINE HCL 1 % IJ SOLN
2.0000 mL | INTRAMUSCULAR | Status: AC | PRN
  Administered 2018-05-12: 2 mL

## 2018-05-12 MED ORDER — BUPIVACAINE HCL 0.5 % IJ SOLN
2.0000 mL | INTRAMUSCULAR | Status: AC | PRN
  Administered 2018-05-12: 2 mL via INTRA_ARTICULAR

## 2018-05-12 MED ORDER — DICLOFENAC SODIUM 1 % TD GEL: TRANSDERMAL | 4 refills | Status: DC

## 2018-05-12 NOTE — Progress Notes (Signed)
Office Visit Note   Patient: Alyssa Montgomery           Date of Birth: 07-19-1960           MRN: [id number] Visit Date: 05/12/2018              Requested by: Everrett Coombe, MD [address] [place], [place] [address] PCP: Everrett Coombe, MD   Assessment & Plan: Visit Diagnoses:  1. Acute pain of right shoulder   2. Left carpal tunnel syndrome     Plan: 1 month status post left carpal tunnel release and doing well in terms of pain relief.  Still having some numbness in the long finger but feeling much better in the index and thumb.  Less pain than she was before surgery.  Some soreness in the area of her wrist. Has also been experiencing right shoulder pain for a "long time".  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  Will inject her shoulder and have her return to see me in 2 weeks for both her shoulder and her carpal tunnel   Follow-Up Instructions: No follow-ups on file.   Orders:  Orders Placed This Encounter  Procedures  . XR Shoulder Right   No orders of the defined types were placed in this encounter.     Procedures: Large Joint Inj: R glenohumeral on 05/12/2018 3:23 PM Indications: pain and diagnostic evaluation Details: 25 G 1.5 in needle, posterior approach  Arthrogram: No  Medications: 2 mL lidocaine 1 %; 2 mL bupivacaine 0.5 %; 80 mg methylPREDNISolone acetate 40 MG/ML Consent was given by the patient. Immediately prior to procedure a time out was called to verify the correct patient, procedure, equipment, support staff and site/side marked as required. Patient was prepped and draped in the usual sterile fashion.       Clinical Data: No additional findings.   Subjective: Chief Complaint  Patient presents with  . Left Wrist - Follow-up    04/17/18 Left CTR  Patient returns for two week follow up. She is status post left carpal tunnel release on 04/17/18.  She continues to have numbness in her left middle  finger. The numbness that she had in her thumb and index finger is getting better. She continues to have a sharp pain that runs through her wrist and hand off and on. She is still very sore around the incision.  She continues to wear the wrist splint.  She is out of the Percocet.  She continues to be out of work.  Has not had to wake up at night and shake her hand  Patient also has complaint of right sided neck and shoulder pain.  She states that she cannot stand this pain anymore. She requests an injection. The pain radiates from her right shoulder into her right neck.  Has had problems predating the carpal tunnel surgery without injury or trauma.  Having difficulty raising her right arm over her head.  HPI  Review of Systems   Objective: Vital Signs: BP 114/88   Pulse 82   Ht 5\' 5"  (1.651 m)   Wt 219 lb (99.3 kg)   BMI 36.44 kg/m   Physical Exam  Ortho Exam awake alert and oriented x3.  Comfortable sitting.  Surgical incision left wrist is healing without problem.  Still has a bit of swelling along the volar aspect of her wrist.  Good opposition of thumb to little finger.  No swelling of  any of her digits.  Wearing rings on her ring finger.  Subjectively has better feeling in the index finger and thumb but possibly more numbness in the long finger. Right shoulder with positive impingement.  Biceps intact.  Skin intact.  Able to place her arm fully overhead with a circuitous motion.  No crepitation.  Good grip and release  Specialty Comments:  No specialty comments available.  Imaging: No results found.   PMFS History: Patient Active Problem List   Diagnosis Date Noted  . Bilateral carpal tunnel syndrome 03/03/2018  . Left carpal tunnel syndrome 01/27/2018  . Complaints of total body pain 12/20/2017  . Hyperlipidemia 11/14/2017  . Anxiety 11/13/2016  . 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 Claudication ([place]) 09/03/2016  . Soreness breast 08/10/2016  . TIA (transient  ischemic attack) 08/10/2016  . History of cervical dysplasia 01/18/2016  . Diabetes ([place]) 04/17/2015  . Healthcare maintenance 04/17/2015  . Tobacco use disorder 04/17/2015  . Acute pain of right shoulder 11/03/2014  . Fibromyalgia 06/18/2014  . Preventative health care 01/21/2014  . Goiter 02/28/2013  . Tobacco abuse 01/18/2012  . FIBROIDS, UTERUS 03/09/2010  . DEPRESSION 12/26/2006  . GERD 12/26/2006   Past Medical History:  Diagnosis Date  . Anxiety   . Bilateral carpal tunnel syndrome 03/03/2018  . Bronchitis   . Diverticulitis   . Fibromyalgia   . GERD (gastroesophageal reflux disease)   . Headache   . Substance abuse ([place])    10 years ago ( cocaine)    Family History  Problem Relation Age of Onset  . Cancer Father        lung  . Hypertension Mother   . Arthritis Mother   . Other Neg Hx        pituitary problem    Past Surgical History:  Procedure Laterality Date  . ANKLE SURGERY    . BACK SURGERY    . Bowel obstruction    . CARPAL TUNNEL RELEASE    . CERVICAL CONIZATION W/BX N/A 12/27/2015   Procedure: CONIZATION CERVIX WITH BIOPSY;  Surgeon: Terrance Mass, MD;  Location: [facility];  Service: Gynecology;  Laterality: N/A;  . DILATATION & CURETTAGE/HYSTEROSCOPY WITH MYOSURE N/A 12/27/2015   Procedure: DILATATION & CURETTAGE/HYSTEROSCOPY WITH MYOSURE;  Surgeon: Terrance Mass, MD;  Location: [facility];  Service: Gynecology;  Laterality: N/A;  . ECTOPIC PREGNANCY SURGERY    . EYE SURGERY     removed right eye;   . EYE SURGERY     Social History   Occupational History  . Not on file  Tobacco Use  . Smoking status: Current Every Day Smoker    Packs/day: 0.50    Years: 18.00    Pack years: 9.00    Types: Cigarettes  . Smokeless tobacco: Never Used  . Tobacco comment: 1-800 number given   Substance and Sexual Activity  . Alcohol use: Yes    Alcohol/week: 0.0 standard drinks    Comment: HOLIDAYS  . 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  Sexual activity: Yes    Partners: Male    Birth control/protection: Post-menopausal

## 2018-05-13 ENCOUNTER — Other Ambulatory Visit: Payer: Medicare Other

## 2018-05-15 ENCOUNTER — Telehealth: Payer: Self-pay | Admitting: Orthopaedic Surgery

## 2018-05-15 NOTE — Telephone Encounter (Signed)
Patient called stating [facility] is requiring her to submit a statement letter on letterhead regarding her activities of daily living.  Patient states she has been unable to use her hand and needed help with all of her daily activities, including bathing, cooking, and cleaning.  Patient states the letter needs to be faxed ASAP to:  [facility] Attn: Claims Department [phone]  Patient requested a return call.

## 2018-05-16 ENCOUNTER — Telehealth (INDEPENDENT_AMBULATORY_CARE_PROVIDER_SITE_OTHER): Payer: Self-pay | Admitting: Orthopaedic Surgery

## 2018-05-16 NOTE — Telephone Encounter (Signed)
Patient request a note from Dr. Durward Fortes stating she needed help three weeks after her surgery for bathing, cooking, etc. The Aid that helps her mother had to help her too. Please call to discuss. [phone] fax# attn. Claims

## 2018-05-16 NOTE — Telephone Encounter (Signed)
Per Dr. Durward Fortes, CTS surgery doesn't qualify for these activities. LMOM for patient.

## 2018-05-16 NOTE — Telephone Encounter (Signed)
Patient would like for you to call her.  CB#[phone].

## 2018-05-19 ENCOUNTER — Other Ambulatory Visit: Payer: Self-pay | Admitting: Obstetrics & Gynecology

## 2018-05-19 ENCOUNTER — Telehealth (INDEPENDENT_AMBULATORY_CARE_PROVIDER_SITE_OTHER): Payer: Self-pay | Admitting: Orthopaedic Surgery

## 2018-05-19 ENCOUNTER — Other Ambulatory Visit (INDEPENDENT_AMBULATORY_CARE_PROVIDER_SITE_OTHER): Payer: Medicare Other

## 2018-05-19 ENCOUNTER — Ambulatory Visit (INDEPENDENT_AMBULATORY_CARE_PROVIDER_SITE_OTHER): Payer: Medicare Other

## 2018-05-19 DIAGNOSIS — Z780 Asymptomatic menopausal state: Secondary | ICD-10-CM | POA: Diagnosis not present

## 2018-05-19 DIAGNOSIS — E7800 Pure hypercholesterolemia, unspecified: Secondary | ICD-10-CM | POA: Diagnosis not present

## 2018-05-19 DIAGNOSIS — Z13820 Encounter for screening for osteoporosis: Secondary | ICD-10-CM

## 2018-05-19 NOTE — Telephone Encounter (Signed)
Patient called requesting a return call ASAP before anything is sent.  Please call back at  [phone].

## 2018-05-20 ENCOUNTER — Telehealth (INDEPENDENT_AMBULATORY_CARE_PROVIDER_SITE_OTHER): Payer: Self-pay | Admitting: Orthopaedic Surgery

## 2018-05-20 LAB — LIPID PANEL
Cholesterol: 225 mg/dL — ABNORMAL HIGH (ref <200)
HDL: 73 mg/dL (ref >50)
LDL Cholesterol (Calc): 127 mg/dL (calc) — ABNORMAL HIGH
Non-HDL Cholesterol (Calc): 152 mg/dL (calc) — ABNORMAL HIGH (ref <130)
Total CHOL/HDL Ratio: 3.1 (calc) (ref <5.0)
Triglycerides: 131 mg/dL (ref <150)

## 2018-05-20 NOTE — Telephone Encounter (Signed)
Patient reques you give her a call back when you get a moment.

## 2018-05-20 NOTE — Telephone Encounter (Signed)
Dr. Durward Fortes declined at home assistance for CTS, [facility] for patient.

## 2018-05-20 NOTE — Telephone Encounter (Signed)
After Dr. Durward Fortes declined patient's in home assistance, Aaron Edelman reviewed chart, I called patient and asked for information from agency who provided the assistance for 3 weeks in order to write a note. Patient stated she did not have an Environmental consultant; however, her mother does have a full time assistance. Her mother's assistant is the one who helped. Since the patient did not have an Environmental consultant, a note cannot be given. Patient asked that I send [employer] her office notes, faxed to [phone].

## 2018-05-20 NOTE — Telephone Encounter (Signed)
I called patient, patient's disability will be sending forms for restrictions for work.

## 2018-05-21 ENCOUNTER — Telehealth (INDEPENDENT_AMBULATORY_CARE_PROVIDER_SITE_OTHER): Payer: Self-pay | Admitting: Orthopaedic Surgery

## 2018-05-21 NOTE — Telephone Encounter (Signed)
Patient left a voicemail requesting to speak with you directly ASAP.

## 2018-05-21 NOTE — Telephone Encounter (Signed)
I called patient, no FMLA paper's received

## 2018-05-23 ENCOUNTER — Encounter (INDEPENDENT_AMBULATORY_CARE_PROVIDER_SITE_OTHER): Payer: Self-pay | Admitting: *Deleted

## 2018-05-23 NOTE — Telephone Encounter (Signed)
Letter with restrictions faxed to [phone] at [facility] for patient

## 2018-05-26 ENCOUNTER — Encounter (INDEPENDENT_AMBULATORY_CARE_PROVIDER_SITE_OTHER): Payer: Self-pay | Admitting: Orthopaedic Surgery

## 2018-05-26 ENCOUNTER — Ambulatory Visit (INDEPENDENT_AMBULATORY_CARE_PROVIDER_SITE_OTHER): Payer: Medicare Other | Admitting: Orthopaedic Surgery

## 2018-05-26 VITALS — BP 125/86 | HR 84 | Ht 65.0 in | Wt 219.0 lb

## 2018-05-26 DIAGNOSIS — G8929 Other chronic pain: Secondary | ICD-10-CM

## 2018-05-26 DIAGNOSIS — M25532 Pain in left wrist: Secondary | ICD-10-CM | POA: Diagnosis not present

## 2018-05-26 DIAGNOSIS — M25511 Pain in right shoulder: Secondary | ICD-10-CM | POA: Diagnosis not present

## 2018-05-26 NOTE — Progress Notes (Signed)
Office Visit Note   Patient: Alyssa Montgomery           Date of Birth: 01-17-1961           MRN: [id number] Visit Date: 05/26/2018              Requested by: Everrett Coombe, MD [address] [place], [place] [address] PCP: Everrett Coombe, MD   Assessment & Plan: Visit Diagnoses:  1. Chronic pain of left wrist   2. Chronic right shoulder pain     Plan: 1 month status post left carpal tunnel release.  Feeling much better in terms of pain.  Still having some numbness in at least the long and ring finger may be the index.  Has full range of motion.  Still some discomfort along the incision.  We will send to physical therapy and start using Voltaren gel the incision.  Also have revisited the issue with her chronic right shoulder pain.  She has had prior films demonstrating some minor arthritic changes of the glenohumeral joint.  Will obtain MRI scan as she is not done well with a cortisone injection.  Unable to work until she is reevaluated.  Has had a lot of trouble with her insurance company regarding her disability.  Follow-Up Instructions: No follow-ups on file.   Orders:  Orders Placed This Encounter  Procedures  . MR SHOULDER RIGHT WO CONTRAST  . Ambulatory referral to Physical Therapy   No orders of the defined types were placed in this encounter.     Procedures: No procedures performed   Clinical Data: No additional findings.   Subjective: Chief Complaint  Patient presents with  . Left Wrist - Follow-up  . Carpal Tunnel    feels sore and she feel like it maybe infection , 2 week follow up   1 month status post left carpal tunnel release.  Not having the pain that she was before surgery.  Still having a little stiffness and some decreased sensibility in the long and index finger.  Better feeling in the thumb and the index.  Still a sore around the incision.  No fever or chills.  Has chronic pain with her right shoulder as previously outlined.  She had "1 day relief  with a cortisone shot".  Also has some neck discomfort but cannot reproduce her shoulder pain with any motion of her neck.  Difficulty raising her arm over her head with positive impingement.  HPI  Review of Systems   Objective: Vital Signs: BP 125/86   Pulse 84   Ht 5\' 5"  (1.651 m)   Wt 219 lb (99.3 kg)   BMI 36.44 kg/m   Physical Exam Constitutional:      Appearance: She is well-developed.  Eyes:     Pupils: Pupils are equal, round, and reactive to light.  Pulmonary:     Effort: Pulmonary effort is normal.  Skin:    General: Skin is warm and dry.  Neurological:     Mental Status: She is alert and oriented to person, place, and time.  Psychiatric:        Behavior: Behavior normal.     Ortho Exam no swelling of left hand.  Does have some induration around her carpal tunnel incision with some mild edema.  Scar is somewhat tender.  Able to oppose thumb to little finger.  Does have some altered sensibility to the tips of the long and ring finger particularly and to some extent the index finger.  No erythema.  Some mild discomfort with range of motion of the cervical spine but no referred pain to her right or left shoulder or upper extremity.  Does have positive impingement right shoulder.  Positive empty can testing.  No crepitation.  Full overhead motion without evidence of adhesive capsulitis  Specialty Comments:  No specialty comments available.  Imaging: No results found.   PMFS History: Patient Active Problem List   Diagnosis Date Noted  . Bilateral carpal tunnel syndrome 03/03/2018  . Left carpal tunnel syndrome 01/27/2018  . Complaints of total body pain 12/20/2017  . Hyperlipidemia 11/14/2017  . Anxiety 11/13/2016  . 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 Claudication ([place]) 09/03/2016  . Soreness breast 08/10/2016  . TIA (transient ischemic attack) 08/10/2016  . History of cervical dysplasia 01/18/2016  . Diabetes ([place]) 04/17/2015  . Healthcare  maintenance 04/17/2015  . Tobacco use disorder 04/17/2015  . Acute pain of right shoulder 11/03/2014  . Fibromyalgia 06/18/2014  . Preventative health care 01/21/2014  . Goiter 02/28/2013  . Tobacco abuse 01/18/2012  . FIBROIDS, UTERUS 03/09/2010  . DEPRESSION 12/26/2006  . GERD 12/26/2006   Past Medical History:  Diagnosis Date  . Anxiety   . Bilateral carpal tunnel syndrome 03/03/2018  . Bronchitis   . Diverticulitis   . Fibromyalgia   . GERD (gastroesophageal reflux disease)   . Headache   . Substance abuse ([place])    10 years ago ( cocaine)    Family History  Problem Relation Age of Onset  . Cancer Father        lung  . Hypertension Mother   . Arthritis Mother   . Other Neg Hx        pituitary problem    Past Surgical History:  Procedure Laterality Date  . ANKLE SURGERY    . BACK SURGERY    . Bowel obstruction    . CARPAL TUNNEL RELEASE    . CERVICAL CONIZATION W/BX N/A 12/27/2015   Procedure: CONIZATION CERVIX WITH BIOPSY;  Surgeon: Terrance Mass, MD;  Location: [facility];  Service: Gynecology;  Laterality: N/A;  . DILATATION & CURETTAGE/HYSTEROSCOPY WITH MYOSURE N/A 12/27/2015   Procedure: DILATATION & CURETTAGE/HYSTEROSCOPY WITH MYOSURE;  Surgeon: Terrance Mass, MD;  Location: [facility];  Service: Gynecology;  Laterality: N/A;  . ECTOPIC PREGNANCY SURGERY    . EYE SURGERY     removed right eye;   . EYE SURGERY     Social History   Occupational History  . Not on file  Tobacco Use  . Smoking status: Current Every Day Smoker    Packs/day: 0.50    Years: 18.00    Pack years: 9.00    Types: Cigarettes  . Smokeless tobacco: Never Used  . Tobacco comment: 1-800 number given   Substance and Sexual Activity  . Alcohol use: Yes    Alcohol/week: 0.0 standard drinks    Comment: HOLIDAYS  . 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 Sexual activity: Yes    Partners: Male    Birth control/protection: Post-menopausal     Garald Balding,  MD   Note - This record has been created using [employer].  Chart creation errors have been sought, but may not always  have been located. Such creation errors do not reflect on  the standard of medical care.

## 2018-06-11 ENCOUNTER — Encounter (HOSPITAL_COMMUNITY): Payer: Self-pay

## 2018-06-11 ENCOUNTER — Ambulatory Visit (HOSPITAL_COMMUNITY)
Admission: EM | Admit: 2018-06-11 | Discharge: 2018-06-11 | Disposition: A | Discharge status: Home / Self Care | Payer: Medicare Other | Attending: Family Medicine | Admitting: Family Medicine

## 2018-06-11 DIAGNOSIS — M542 Cervicalgia: Secondary | ICD-10-CM

## 2018-06-11 DIAGNOSIS — S161XXA Strain of muscle, fascia and tendon at neck level, initial encounter: Secondary | ICD-10-CM

## 2018-06-11 MED ORDER — CYCLOBENZAPRINE HCL 5 MG PO TABS: 5.0000 mg | ORAL_TABLET | Freq: Three times a day (TID) | ORAL | 0 refills | Status: DC | PRN

## 2018-06-11 MED ORDER — NAPROXEN 500 MG PO TABS: 500.0000 mg | ORAL_TABLET | Freq: Two times a day (BID) | ORAL | 0 refills | Status: DC

## 2018-06-11 NOTE — ED Triage Notes (Signed)
Pt presents with neck pain since trying to pick up grandchild around [place].

## 2018-06-11 NOTE — Discharge Instructions (Signed)
Be aware, muscle relaxing medications may cause drowsiness. Please do not drive, operate heavy machinery or make important decisions while on this medication, it can cloud your judgement.

## 2018-06-13 ENCOUNTER — Other Ambulatory Visit: Payer: Self-pay

## 2018-06-13 NOTE — ED Provider Notes (Signed)
[place]   [id number] 06/11/18 Arrival Time: 5784  ASSESSMENT & PLAN:  1. Neck pain   2. Acute strain of neck muscle, initial encounter    No indication for imaging of neck. No radicular pain that would suggest spinal involvement. Discussed.  Meds ordered this encounter  Medications  . naproxen (NAPROSYN) 500 MG tablet    Sig: Take 1 tablet (500 mg total) by mouth 2 (two) times daily with a meal.    Dispense:  20 tablet    Refill:  0  . cyclobenzaprine (FLEXERIL) 5 MG tablet    Sig: Take 1 tablet (5 mg total) by mouth 3 (three) times daily as needed for muscle spasms.    Dispense:  15 tablet    Refill:  0    Follow-up Information    Everrett Coombe, MD.   Specialty:  Family Medicine Why:  As needed. Contact information: [address] [place] [address] [phone]          Medication sedation precautions given. Encourage neck ROM as she tolerates.  Reviewed expectations re: course of current medical issues. Questions answered. Outlined signs and symptoms indicating need for more acute intervention. Patient verbalized understanding. After Visit Summary given.  SUBJECTIVE: History from: patient. Alyssa Montgomery is a 58 y.o. female who reports fairly persistent mild to moderate pain of her right posterior neck; described as cramping and stiffness without radiation. Onset: gradual, started a few days ago after lifting her grandchild; no direct neck trauma. Symptoms have progressed to a point and plateaued since beginning. Aggravating factors: certain neck movements; more when turning head to her right. Alleviating factors: rest. Associated symptoms: none reported. Extremity sensation changes or weakness: none. Self treatment: OTC analgesics without much help; discomfort now affecting sleep. No headaches or vision changes. Afebrile. Husband with her; reports no confusion or mental status changes. History of similar: no.  Past Surgical History:    Procedure Laterality Date  . ANKLE SURGERY    . BACK SURGERY    . Bowel obstruction    . CARPAL TUNNEL RELEASE    . CERVICAL CONIZATION W/BX N/A 12/27/2015   Procedure: CONIZATION CERVIX WITH BIOPSY;  Surgeon: Terrance Mass, MD;  Location: [facility];  Service: Gynecology;  Laterality: N/A;  . DILATATION & CURETTAGE/HYSTEROSCOPY WITH MYOSURE N/A 12/27/2015   Procedure: DILATATION & CURETTAGE/HYSTEROSCOPY WITH MYOSURE;  Surgeon: Terrance Mass, MD;  Location: [facility];  Service: Gynecology;  Laterality: N/A;  . ECTOPIC PREGNANCY SURGERY    . EYE SURGERY     removed right eye;   . EYE SURGERY      ROS: As per HPI. All other systems negative.   OBJECTIVE:  Vitals:   06/11/18 1803  BP: 121/85  Pulse: 66  Resp: 19  Temp: (!) 97.2 F (36.2 C)  SpO2: 100%    General appearance: alert; no distress HEENT: [place]; AT; PERRLA; EOMI Neck: supple but "feels very stiff"; hesitates to look to either side secondary to reported pain; no midline tenderness; can look up and down with less pain CV: RRR Lungs: CTAB; unlabored Extremities: . RUE and LUE: warm and well perfused; without tenderness; without gross deformities; with no swelling; with no bruising; ROM: normal; normal grip strength CV: brisk extremity capillary refill of RUE and LUE; 2+ radial pulse of RUE and LUE. Skin: warm and dry; no visible rashes Neurologic: gait normal; normal reflexes of RUE and LUE; normal sensation of RUE and LUE; normal strength of RUE and  LUE Psychological: alert and cooperative; normal mood and affect  Allergies  Allergen Reactions  . Zanaflex [Tizanidine Hcl] Other (See Comments)    Syncope  . Penicillins Other (See Comments)    Unknown childhood reaction. Has patient had a PCN reaction causing immediate rash, facial/tongue/throat swelling, SOB or lightheadedness with hypotension: Unknown Has patient had a PCN reaction causing severe rash involving mucus membranes or skin necrosis: Unknown Has patient had  a PCN reaction that required hospitalization: Unknown Has patient had a PCN reaction occurring within the last 10 years: No If all of the above answers are "NO", then may proceed with Cephalosporin use.   . Sulfonamide Derivatives Itching    Past Medical History:  Diagnosis Date  . Anxiety   . Bilateral carpal tunnel syndrome 03/03/2018  . Bronchitis   . Diverticulitis   . Fibromyalgia   . GERD (gastroesophageal reflux disease)   . Headache   . Substance abuse ([place])    10 years ago ( cocaine)   Social History   Socioeconomic History  . Marital status: Married    Spouse name: Not on file  . Number of children: Not on file  . Years of education: Not on file  . Highest education level: Not on file  Occupational History  . Not on file  Social Needs  . Financial resource strain: Not on file  . Food insecurity:    Worry: Not on file    Inability: Not on file  . Transportation needs:    Medical: Not on file    Non-medical: Not on file  Tobacco Use  . Smoking status: Current Every Day Smoker    Packs/day: 0.50    Years: 18.00    Pack years: 9.00    Types: Cigarettes  . Smokeless tobacco: Never Used  . Tobacco comment: 1-800 number given   Substance and Sexual Activity  . Alcohol use: Yes    Alcohol/week: 0.0 standard drinks    Comment: HOLIDAYS  . 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 Sexual activity: Yes    Partners: Male    Birth control/protection: Post-menopausal  Lifestyle  . Physical activity:    Days per week: Not on file    Minutes per session: Not on file  . Stress: Not on file  Relationships  . Social connections:    Talks on phone: Not on file    Gets together: Not on file    Attends religious service: Not on file    Active member of club or organization: Not on file    Attends meetings of clubs or organizations: Not on file    Relationship status: Not on file  Other Topics Concern  . Not on file  Social History Narrative  .  Not on file   Family History  Problem Relation Age of Onset  . Cancer Father        lung  . Hypertension Mother   . Arthritis Mother   . Other Neg Hx        pituitary problem   Past Surgical History:  Procedure Laterality Date  . ANKLE SURGERY    . BACK SURGERY    . Bowel obstruction    . CARPAL TUNNEL RELEASE    . CERVICAL CONIZATION W/BX N/A 12/27/2015   Procedure: CONIZATION CERVIX WITH BIOPSY;  Surgeon: Terrance Mass, MD;  Location: [facility];  Service: Gynecology;  Laterality: N/A;  . DILATATION & CURETTAGE/HYSTEROSCOPY WITH MYOSURE N/A  12/27/2015   Procedure: DILATATION & CURETTAGE/HYSTEROSCOPY WITH MYOSURE;  Surgeon: Terrance Mass, MD;  Location: [facility];  Service: Gynecology;  Laterality: N/A;  . ECTOPIC PREGNANCY SURGERY    . EYE SURGERY     removed right eye;   . EYE SURGERY        Vanessa Kick, MD 06/13/18 1051

## 2018-06-19 ENCOUNTER — Ambulatory Visit: Payer: Medicare Other | Admitting: Occupational Therapy

## 2018-06-22 ENCOUNTER — Ambulatory Visit
Admission: RE | Admit: 2018-06-22 | Discharge: 2018-06-22 | Disposition: A | Discharge status: Home / Self Care | Payer: Medicare Other | Source: Ambulatory Visit | Attending: Orthopaedic Surgery | Admitting: Orthopaedic Surgery

## 2018-06-22 DIAGNOSIS — G8929 Other chronic pain: Secondary | ICD-10-CM

## 2018-06-22 DIAGNOSIS — M25511 Pain in right shoulder: Principal | ICD-10-CM

## 2018-06-22 DIAGNOSIS — M19011 Primary osteoarthritis, right shoulder: Secondary | ICD-10-CM | POA: Diagnosis not present

## 2018-06-23 ENCOUNTER — Telehealth (INDEPENDENT_AMBULATORY_CARE_PROVIDER_SITE_OTHER): Payer: Self-pay | Admitting: Orthopaedic Surgery

## 2018-06-23 NOTE — Telephone Encounter (Signed)
Patient called stating she was returning your call regarding scheduling an appointment for today.  Patient states she is not able to come in today, but is scheduled to see Dr. Durward Fortes on Thursday 1/16 at 1:45 pm.

## 2018-06-24 ENCOUNTER — Ambulatory Visit: Payer: Medicare Other | Attending: Orthopaedic Surgery | Admitting: Occupational Therapy

## 2018-06-25 ENCOUNTER — Encounter

## 2018-06-26 ENCOUNTER — Ambulatory Visit (INDEPENDENT_AMBULATORY_CARE_PROVIDER_SITE_OTHER): Payer: Medicare Other

## 2018-06-26 ENCOUNTER — Ambulatory Visit (INDEPENDENT_AMBULATORY_CARE_PROVIDER_SITE_OTHER): Payer: Medicare Other | Admitting: Orthopaedic Surgery

## 2018-06-26 ENCOUNTER — Encounter (INDEPENDENT_AMBULATORY_CARE_PROVIDER_SITE_OTHER): Payer: Self-pay | Admitting: Orthopaedic Surgery

## 2018-06-26 DIAGNOSIS — M542 Cervicalgia: Secondary | ICD-10-CM

## 2018-06-26 DIAGNOSIS — M25511 Pain in right shoulder: Secondary | ICD-10-CM

## 2018-06-26 NOTE — Progress Notes (Signed)
Office Visit Note   Patient: Alyssa Montgomery           Date of Birth: [birth year]           MRN: [id number] Visit Date: 06/26/2018              Requested by: Everrett Coombe, MD [address] [place], [place] [address] PCP: Everrett Coombe, MD   Assessment & Plan: Visit Diagnoses:  1. Acute pain of right shoulder   2. Cervicalgia     Plan: Discussed results of MRI scan right shoulder as outlined below.  Does have a rotator cuff tear that is essentially unchanged since 2016.  I am concerned that a lot of her pain may be referred from her cervical spine.  Films demonstrate significant degenerative changes with loss of normal cervical lordosis.  Will order MRI scan C-spine.  Continue with therapy for the left rotator cuff release and consider of therapy for cervical spine and right shoulder after the MRI scan.  No work.  Completed paperwork.  Office visit over 30 minutes 50% of the time in counseling discussing diagnosis and treatment options.    Follow-Up Instructions: No follow-ups on file.   Orders:  Orders Placed This Encounter  Procedures  . XR Cervical Spine 2 or 3 views   No orders of the defined types were placed in this encounter.     Procedures: No procedures performed   Clinical Data: No additional findings.   Subjective: Chief Complaint  Patient presents with  . Right Shoulder - Follow-up  . Lt wrist  . Results  Just over 2 months status post left carpal tunnel release.  No longer has any pain in her hand but has some numbness in the index and long finger.  Continues to go to physical therapy.  Not unhappy with the with the results that she no longer has pain.  We have also been evaluating the problem with her shoulder.  She had an MRI scan of the rotator cuff involving the supraspinatus which is unchanged but has had a full rupture of the long head of the biceps.  She has some arthritic changes the [facility] joint and mild degenerative changes the glenohumeral joint she  recently visited the emergency room for acute onset of neck pain.  HPI  Review of Systems   Objective: Vital Signs: There were no vitals taken for this visit.  Physical Exam Constitutional:      Appearance: She is well-developed.  Eyes:     Pupils: Pupils are equal, round, and reactive to light.  Pulmonary:     Effort: Pulmonary effort is normal.  Skin:    General: Skin is warm and dry.  Neurological:     Mental Status: She is alert and oriented to person, place, and time.  Psychiatric:        Behavior: Behavior normal.     Ortho Exam awake alert and oriented x3.  Comfortable sitting.  Wearing the splint to her left wrist.  Still some mild tenderness about the carpal tunnel incision.  The incision has healed nicely.  Less induration.  Good capillary refill to fingers.  Subjective decreased sensibility to index and long fingers.  Full fist.  Some pain with range of motion of the cervical spine on extremes of flexion extension and rotation to the right with posterior cervical pain and some discomfort referred to the right scapula.  Minimally positive impingement.  Good grip and good release.  Full overhead motion able  to scratch her back  Specialty Comments:  No specialty comments available.  Imaging: No results found.   PMFS History: Patient Active Problem List   Diagnosis Date Noted  . Bilateral carpal tunnel syndrome 03/03/2018  . Left carpal tunnel syndrome 01/27/2018  . Complaints of total body pain 12/20/2017  . Hyperlipidemia 11/14/2017  . Anxiety 11/13/2016  . 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 Claudication ([place]) 09/03/2016  . Soreness breast 08/10/2016  . TIA (transient ischemic attack) 08/10/2016  . History of cervical dysplasia 01/18/2016  . Diabetes ([place]) 04/17/2015  . Healthcare maintenance 04/17/2015  . Tobacco use disorder 04/17/2015  . Cervicalgia 11/16/2014  . Acute pain of right shoulder 11/03/2014  . Fibromyalgia 06/18/2014  .  Preventative health care 01/21/2014  . Goiter 02/28/2013  . Tobacco abuse 01/18/2012  . FIBROIDS, UTERUS 03/09/2010  . DEPRESSION 12/26/2006  . GERD 12/26/2006   Past Medical History:  Diagnosis Date  . Anxiety   . Bilateral carpal tunnel syndrome 03/03/2018  . Bronchitis   . Diverticulitis   . Fibromyalgia   . GERD (gastroesophageal reflux disease)   . Headache   . Substance abuse ([place])    10 years ago ( cocaine)    Family History  Problem Relation Age of Onset  . Cancer Father        lung  . Hypertension Mother   . Arthritis Mother   . Other Neg Hx        pituitary problem    Past Surgical History:  Procedure Laterality Date  . ANKLE SURGERY    . BACK SURGERY    . Bowel obstruction    . CARPAL TUNNEL RELEASE    . CERVICAL CONIZATION W/BX N/A 12/27/2015   Procedure: CONIZATION CERVIX WITH BIOPSY;  Surgeon: Terrance Mass, MD;  Location: [facility];  Service: Gynecology;  Laterality: N/A;  . DILATATION & CURETTAGE/HYSTEROSCOPY WITH MYOSURE N/A 12/27/2015   Procedure: DILATATION & CURETTAGE/HYSTEROSCOPY WITH MYOSURE;  Surgeon: Terrance Mass, MD;  Location: [facility];  Service: Gynecology;  Laterality: N/A;  . ECTOPIC PREGNANCY SURGERY    . EYE SURGERY     removed right eye;   . EYE SURGERY     Social History   Occupational History  . Not on file  Tobacco Use  . Smoking status: Current Every Day Smoker    Packs/day: 0.50    Years: 18.00    Pack years: 9.00    Types: Cigarettes  . Smokeless tobacco: Never Used  . Tobacco comment: 1-800 number given   Substance and Sexual Activity  . Alcohol use: Yes    Alcohol/week: 0.0 standard drinks    Comment: HOLIDAYS  . 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 Sexual activity: Yes    Partners: Male    Birth control/protection: Post-menopausal     Garald Balding, MD   Note - This record has been created using [employer].  Chart creation errors have been sought, but may not always  have been  located. Such creation errors do not reflect on  the standard of medical care.

## 2018-07-06 ENCOUNTER — Ambulatory Visit
Admission: RE | Admit: 2018-07-06 | Discharge: 2018-07-06 | Disposition: A | Discharge status: Home / Self Care | Payer: Medicare Other | Source: Ambulatory Visit | Attending: Orthopaedic Surgery | Admitting: Orthopaedic Surgery

## 2018-07-06 DIAGNOSIS — M4802 Spinal stenosis, cervical region: Secondary | ICD-10-CM | POA: Diagnosis not present

## 2018-07-06 DIAGNOSIS — M50222 Other cervical disc displacement at C5-C6 level: Secondary | ICD-10-CM | POA: Diagnosis not present

## 2018-07-06 DIAGNOSIS — M50223 Other cervical disc displacement at C6-C7 level: Secondary | ICD-10-CM | POA: Diagnosis not present

## 2018-07-06 DIAGNOSIS — M542 Cervicalgia: Secondary | ICD-10-CM

## 2018-07-10 ENCOUNTER — Encounter (INDEPENDENT_AMBULATORY_CARE_PROVIDER_SITE_OTHER): Payer: Self-pay | Admitting: Orthopaedic Surgery

## 2018-07-10 ENCOUNTER — Ambulatory Visit (INDEPENDENT_AMBULATORY_CARE_PROVIDER_SITE_OTHER): Payer: Medicare Other | Admitting: Orthopaedic Surgery

## 2018-07-10 VITALS — BP 125/83 | HR 71 | Ht 65.0 in | Wt 219.0 lb

## 2018-07-10 DIAGNOSIS — M542 Cervicalgia: Secondary | ICD-10-CM | POA: Diagnosis not present

## 2018-07-10 MED ORDER — TRAMADOL HCL 50 MG PO TABS: 50.0000 mg | ORAL_TABLET | Freq: Three times a day (TID) | ORAL | 0 refills | Status: DC

## 2018-07-10 NOTE — Addendum Note (Signed)
Addended by: Tyrone Apple on: 07/10/2018 04:55 PM   Modules accepted: Orders

## 2018-07-10 NOTE — Progress Notes (Signed)
Office Visit Note   Patient: Alyssa Montgomery           Date of Birth: 04-29-1961           MRN: [id number] Visit Date: 07/10/2018              Requested by: Everrett Coombe, MD [address] [place], [place] [address] PCP: Everrett Coombe, MD   Assessment & Plan: Visit Diagnoses:  1. Cervicalgia     Plan: MRI scan reveals straightening of the normal lordosis which we did see on plain films.  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  Long discussion with Alyssa Montgomery regarding the findings and treatment options.  I think a course of physical therapy will help.  I would also like her to see Dr. Ernestina Patches for consideration of a cervical epidural steroid.  We will give her prescription for tramadol.  She also is going to follow-up with her primary care physician regarding her fibromyalgia.  Follow-Up Instructions: Return in about 1 month (around 08/09/2018).   Orders:  Orders Placed This Encounter  Procedures  . Ambulatory referral to Physical Medicine Rehab   Meds ordered this encounter  Medications  . traMADol (ULTRAM) 50 MG tablet    Sig: Take 1 tablet (50 mg total) by mouth 3 (three) times daily.    Dispense:  30 tablet    Refill:  0      Procedures: No procedures performed   Clinical Data: No additional findings.   Subjective: Chief Complaint  Patient presents with  . Neck - Follow-up  . Neck Pain    discuss results   No change in symptoms.  Has multiple joint complaints which could be related to her fibromyalgia.  She is status post carpal tunnel release on the left and still having some numbness in the index and long finger.  I wonder if this is in some are related to her neck as she is having less numbness in the long finger and none of the preoperative pain.  Having some trouble with her shoulders and going to physical therapy for her left hand status post carpal tunnel release  HPI  Review of Systems   Objective: Vital Signs: BP 125/83 (BP Location: Left Arm)   Pulse  71   Ht 5\' 5"  (1.651 m)   Wt 219 lb (99.3 kg)   BMI 36.44 kg/m   Physical Exam Constitutional:      Appearance: She is well-developed.  Eyes:     Pupils: Pupils are equal, round, and reactive to light.  Pulmonary:     Effort: Pulmonary effort is normal.  Skin:    General: Skin is warm and dry.  Neurological:     Mental Status: She is alert and oriented to person, place, and time.  Psychiatric:        Behavior: Behavior normal.     Ortho Exam some limitation of cervical spine motion.  No specific referred pain to either upper extremity with neck motion.  Limitation of neck extension.  Able to touch her chin to her chest but slowly.  Good grip and good release.  No change in exam of her left hand status post carpal tunnel release.  She is able to make a full fist has good opposition of thumb to little finger.  Specialty Comments:  No specialty comments available.  Imaging: No results found.   PMFS History: Patient Active Problem List   Diagnosis Date Noted  . Bilateral carpal  tunnel syndrome 03/03/2018  . Left carpal tunnel syndrome 01/27/2018  . Complaints of total body pain 12/20/2017  . Hyperlipidemia 11/14/2017  . Anxiety 11/13/2016  . 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 Claudication ([place]) 09/03/2016  . Soreness breast 08/10/2016  . TIA (transient ischemic attack) 08/10/2016  . History of cervical dysplasia 01/18/2016  . Diabetes ([place]) 04/17/2015  . Healthcare maintenance 04/17/2015  . Tobacco use disorder 04/17/2015  . Cervicalgia 11/16/2014  . Acute pain of right shoulder 11/03/2014  . Fibromyalgia 06/18/2014  . Preventative health care 01/21/2014  . Goiter 02/28/2013  . Tobacco abuse 01/18/2012  . FIBROIDS, UTERUS 03/09/2010  . DEPRESSION 12/26/2006  . GERD 12/26/2006   Past Medical History:  Diagnosis Date  . Anxiety   . Bilateral carpal tunnel syndrome 03/03/2018  . Bronchitis   . Diverticulitis   . Fibromyalgia   . GERD (gastroesophageal  reflux disease)   . Headache   . Substance abuse ([place])    10 years ago ( cocaine)    Family History  Problem Relation Age of Onset  . Cancer Father        lung  . Hypertension Mother   . Arthritis Mother   . Other Neg Hx        pituitary problem    Past Surgical History:  Procedure Laterality Date  . ANKLE SURGERY    . BACK SURGERY    . Bowel obstruction    . CARPAL TUNNEL RELEASE    . CERVICAL CONIZATION W/BX N/A 12/27/2015   Procedure: CONIZATION CERVIX WITH BIOPSY;  Surgeon: Terrance Mass, MD;  Location: [facility];  Service: Gynecology;  Laterality: N/A;  . DILATATION & CURETTAGE/HYSTEROSCOPY WITH MYOSURE N/A 12/27/2015   Procedure: DILATATION & CURETTAGE/HYSTEROSCOPY WITH MYOSURE;  Surgeon: Terrance Mass, MD;  Location: [facility];  Service: Gynecology;  Laterality: N/A;  . ECTOPIC PREGNANCY SURGERY    . EYE SURGERY     removed right eye;   . EYE SURGERY     Social History   Occupational History  . Not on file  Tobacco Use  . Smoking status: Current Every Day Smoker    Packs/day: 0.50    Years: 18.00    Pack years: 9.00    Types: Cigarettes  . Smokeless tobacco: Never Used  . Tobacco comment: 1-800 number given   Substance and Sexual Activity  . Alcohol use: Yes    Alcohol/week: 0.0 standard drinks    Comment: HOLIDAYS  . 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 Sexual activity: Yes    Partners: Male    Birth control/protection: Post-menopausal     Garald Balding, MD   Note - This record has been created using [employer].  Chart creation errors have been sought, but may not always  have been located. Such creation errors do not reflect on  the standard of medical care.

## 2018-07-14 ENCOUNTER — Ambulatory Visit: Payer: Medicare Other | Attending: Occupational Therapy | Admitting: Occupational Therapy

## 2018-07-15 ENCOUNTER — Ambulatory Visit (INDEPENDENT_AMBULATORY_CARE_PROVIDER_SITE_OTHER): Payer: Medicare Other | Admitting: Orthopaedic Surgery

## 2018-07-18 ENCOUNTER — Telehealth (INDEPENDENT_AMBULATORY_CARE_PROVIDER_SITE_OTHER): Payer: Self-pay | Admitting: Orthopaedic Surgery

## 2018-07-18 NOTE — Telephone Encounter (Signed)
I called patient, MRI reports at front desk.

## 2018-07-18 NOTE — Telephone Encounter (Signed)
Patient requesting another copy of MRI report. She lost her copy doctor gave her at her last appt. Patient will pick up copy.

## 2018-07-21 ENCOUNTER — Telehealth (INDEPENDENT_AMBULATORY_CARE_PROVIDER_SITE_OTHER): Payer: Self-pay | Admitting: Orthopaedic Surgery

## 2018-07-21 NOTE — Telephone Encounter (Signed)
Patient paid $25.00 cash to [employer] for [employer] form on 07/21/2018.

## 2018-07-29 ENCOUNTER — Ambulatory Visit (INDEPENDENT_AMBULATORY_CARE_PROVIDER_SITE_OTHER): Payer: Medicare Other | Admitting: Physical Medicine and Rehabilitation

## 2018-07-29 ENCOUNTER — Encounter (INDEPENDENT_AMBULATORY_CARE_PROVIDER_SITE_OTHER): Payer: Self-pay | Admitting: Physical Medicine and Rehabilitation

## 2018-07-29 ENCOUNTER — Telehealth (INDEPENDENT_AMBULATORY_CARE_PROVIDER_SITE_OTHER): Payer: Self-pay | Admitting: *Deleted

## 2018-07-29 ENCOUNTER — Encounter

## 2018-07-29 VITALS — BP 140/85 | HR 65 | Ht 65.0 in | Wt 219.0 lb

## 2018-07-29 DIAGNOSIS — M4802 Spinal stenosis, cervical region: Secondary | ICD-10-CM | POA: Diagnosis not present

## 2018-07-29 DIAGNOSIS — M7918 Myalgia, other site: Secondary | ICD-10-CM | POA: Diagnosis not present

## 2018-07-29 DIAGNOSIS — M797 Fibromyalgia: Secondary | ICD-10-CM | POA: Diagnosis not present

## 2018-07-29 DIAGNOSIS — M542 Cervicalgia: Secondary | ICD-10-CM

## 2018-07-29 DIAGNOSIS — G4486 Cervicogenic headache: Secondary | ICD-10-CM

## 2018-07-29 DIAGNOSIS — M47812 Spondylosis without myelopathy or radiculopathy, cervical region: Secondary | ICD-10-CM

## 2018-07-29 DIAGNOSIS — R51 Headache: Secondary | ICD-10-CM

## 2018-07-29 HISTORY — DX: Spondylosis without myelopathy or radiculopathy, cervical region: M47.812

## 2018-07-29 HISTORY — DX: Cervicogenic headache: G44.86

## 2018-07-29 HISTORY — DX: Spinal stenosis, cervical region: M48.02

## 2018-07-29 MED ORDER — CYCLOBENZAPRINE HCL 5 MG PO TABS: 5.0000 mg | ORAL_TABLET | Freq: Three times a day (TID) | ORAL | 0 refills | Status: DC | PRN

## 2018-07-29 NOTE — Progress Notes (Signed)
 .  Numeric Pain Rating Scale and Functional Assessment Average Pain 10 Pain Right Now 10 My pain is constant, sharp and stabbing Pain is worse with: unsure Pain improves with: therapy/exercise   In the last MONTH (on 0-10 scale) has pain interfered with the following?  1. General activity like being  able to carry out your everyday physical activities such as walking, climbing stairs, carrying groceries, or moving a chair?  Rating(10)  2. Relation with others like being able to carry out your usual social activities and roles such as  activities at home, at work and in your community. Rating(10)  3. Enjoyment of life such that you have  been bothered by emotional problems such as feeling anxious, depressed or irritable?  Rating(6)

## 2018-07-30 ENCOUNTER — Telehealth (INDEPENDENT_AMBULATORY_CARE_PROVIDER_SITE_OTHER): Payer: Self-pay | Admitting: *Deleted

## 2018-07-30 NOTE — Telephone Encounter (Signed)
Please advise 

## 2018-07-30 NOTE — Telephone Encounter (Signed)
Can you look to see if [facility] is in that area or [place]

## 2018-07-31 ENCOUNTER — Other Ambulatory Visit (INDEPENDENT_AMBULATORY_CARE_PROVIDER_SITE_OTHER): Payer: Self-pay | Admitting: *Deleted

## 2018-07-31 DIAGNOSIS — M4802 Spinal stenosis, cervical region: Secondary | ICD-10-CM

## 2018-07-31 NOTE — Telephone Encounter (Signed)
According to Epic, Dr Ernestina Patches prescribed it on 2/18

## 2018-07-31 NOTE — Telephone Encounter (Signed)
Referral has been sent to [facility] per Dr. Ernestina Patches

## 2018-07-31 NOTE — Telephone Encounter (Signed)
Called patient. No answer. LMOM that medication was sent in two days ago.

## 2018-08-11 ENCOUNTER — Encounter (INDEPENDENT_AMBULATORY_CARE_PROVIDER_SITE_OTHER): Payer: Self-pay | Admitting: Orthopaedic Surgery

## 2018-08-11 ENCOUNTER — Telehealth (INDEPENDENT_AMBULATORY_CARE_PROVIDER_SITE_OTHER): Payer: Self-pay | Admitting: Orthopaedic Surgery

## 2018-08-11 ENCOUNTER — Encounter (INDEPENDENT_AMBULATORY_CARE_PROVIDER_SITE_OTHER): Payer: Medicare Other | Admitting: Physical Medicine and Rehabilitation

## 2018-08-11 ENCOUNTER — Telehealth (INDEPENDENT_AMBULATORY_CARE_PROVIDER_SITE_OTHER): Payer: Self-pay | Admitting: Physical Medicine and Rehabilitation

## 2018-08-11 NOTE — Telephone Encounter (Signed)
Please advise 

## 2018-08-11 NOTE — Telephone Encounter (Signed)
Please complete and call patient. Thank you.

## 2018-08-11 NOTE — Telephone Encounter (Signed)
Left message for patient to call back to reschedule.

## 2018-08-11 NOTE — Telephone Encounter (Signed)
Patient called requesting Dr. Durward Fortes write her a note so she is able to go back to work light duty at [employer].   Patient requested the note be faxed to # [phone]  Attn: Jocelyn Lamer

## 2018-08-11 NOTE — Telephone Encounter (Signed)
Patient needs to cancel her appointment for today due to not having any transportation and would like for someone to give her a call to r/s this appointment.   She did leave a voicemail as well.   CB#[phone]

## 2018-08-11 NOTE — Telephone Encounter (Signed)
Note has been faxed to number provided.

## 2018-08-11 NOTE — Telephone Encounter (Signed)
Ok to write? 

## 2018-08-14 NOTE — Telephone Encounter (Signed)
Rescheduled

## 2018-08-20 ENCOUNTER — Telehealth (INDEPENDENT_AMBULATORY_CARE_PROVIDER_SITE_OTHER): Payer: Self-pay | Admitting: Orthopaedic Surgery

## 2018-08-20 NOTE — Telephone Encounter (Signed)
Patient called stating she needs Dr. Durward Fortes to fax a letter to her employer releasing her to return to work light duty.  Fax [phone]  Attn:  [employer]

## 2018-08-21 ENCOUNTER — Encounter (INDEPENDENT_AMBULATORY_CARE_PROVIDER_SITE_OTHER): Payer: Self-pay | Admitting: Physical Medicine and Rehabilitation

## 2018-08-21 ENCOUNTER — Encounter (INDEPENDENT_AMBULATORY_CARE_PROVIDER_SITE_OTHER): Payer: Self-pay | Admitting: Orthopaedic Surgery

## 2018-08-21 NOTE — Progress Notes (Signed)
[name] - 58 y.o. female MRN [id number]  Date of birth: 11-Apr-1961  Office Visit Note: Visit Date: 07/29/2018 PCP: Everrett Coombe, MD Referred by: Everrett Coombe, MD  Subjective: Chief Complaint  Patient presents with   Neck - Pain   Right Shoulder - Pain   Left Shoulder - Pain   Right Arm - Pain   Left Arm - Pain   HPI: Alyssa Montgomery is a 58 y.o. female who comes in today At the request of Dr. Joni Fears for evaluation management of chronic worsening severe neck and bilateral shoulder and arm pain with significant headache that has started over the last 6 months.  Patient is somewhat complicated in the fact that she underwent electrodiagnostic studies last year that showed carpal tunnel problems and median nerve neuropathy.  This was completed by Dr. Margette Fast at [facility] neurolog[facility].  This electrodiagnostic studies reviewed below but did not show any evidence of radiculopathy or peripheral polyneuropathy.  The patient is an insulin-dependent diabetic as well.  Subsequently she underwent carpal tunnel release by Dr. Joni Fears and is done well with that.  Unfortunately she continued to have worsening neck shoulder and arm pain and ended up in the emergency room to the severity of her symptoms.  Dr. Durward Fortes did obtain MRI of the cervical spine and this is reviewed with the patient today and reviewed below.  She reports feeling like there is a constant kink in her neck and the right side is worse than the left.  She reports constant sharp stabbing pain.  She is really unsure and is a difficult historian in terms of what makes it worse.  She does think that going to therapy and doing exercises helps with the pain to a degree.  She reports that it significantly limits what she can do during the day at this point and she is having difficulty at work and doing other activities.  Dr. Durward Fortes evaluated her from an orthopedic standpoint did not feel like this was her shoulder.   She does not really endorse some much numbness and tingling as she does pain.  Furthermore, case is really complicated by underlying fibromyalgia and anxiety.  Review of Systems  Constitutional: Negative for chills, fever, malaise/fatigue and weight loss.  HENT: Negative for hearing loss and sinus pain.   Eyes: Negative for blurred vision, double vision and photophobia.  Respiratory: Negative for cough and shortness of breath.   Cardiovascular: Negative for chest pain, palpitations and leg swelling.  Gastrointestinal: Negative for abdominal pain, nausea and vomiting.  Genitourinary: Negative for flank pain.  Musculoskeletal: Positive for joint pain and neck pain. Negative for myalgias.  Skin: Negative for itching and rash.  Neurological: Positive for headaches. Negative for tremors, focal weakness and weakness.  Endo/Heme/Allergies: Negative.   Psychiatric/Behavioral: Negative for depression.  All other systems reviewed and are negative.  Otherwise per HPI.  Assessment & Plan: Visit Diagnoses:  1. Spinal stenosis of cervical region   2. Cervical spondylosis without myelopathy   3. Cervicalgia   4. Myofascial pain syndrome   5. Fibromyalgia   6. Cervicogenic headache     Plan: Findings:  6 months of worsening severe neck pain bilateral shoulder and arm pain without paresthesia and it is constant without significant worsening with position.  Exam is consistent with cervical source of her pain but also some underlying fibromyalgia and myofascial trigger points.  There is no indication that this is her shoulder or anything intrinsic to the arms.  Electrodiagnostic study was done last year and did not show radiculopathy but this could have changed.  MRI does show cervical stenosis again as reviewed below.  I think the best approach given her level of symptoms is cervical epidural steroid injection.  This would be diagnostic and therapeutic.  If she gets some relief would repeat the injection.   If she gets relief of the shoulder pain and neck pain but is still having some rotational pain would look at facet joint block and potentially facet joint block in the upper cervical spine for the headache.  Would suggest she follow back up with her neurologist concerning the headache.  We also discussed relative risk of cervical procedures as well as increasing blood sugar with the cortisone medication.  She will continue with current therapy and exercises and medication.    Meds & Orders:  Meds ordered this encounter  Medications   cyclobenzaprine (FLEXERIL) 5 MG tablet    Sig: Take 1 tablet (5 mg total) by mouth 3 (three) times daily as needed for muscle spasms.    Dispense:  30 tablet    Refill:  0   No orders of the defined types were placed in this encounter.   Follow-up: Return for C7-T1 interlaminar steroid injection.   Procedures: No procedures performed  No notes on file   Clinical History: MRI CERVICAL SPINE WITHOUT CONTRAST  TECHNIQUE: Multiplanar, multisequence MR imaging of the cervical spine was performed. No intravenous contrast was administered.  COMPARISON:  Cervical spine radiographs 06/26/2018  FINDINGS: Alignment: Reversal of normal cervical lordosis.  Vertebrae: No focal marrow lesion. No compression fracture or evidence of discitis osteomyelitis.  Cord: Normal caliber and signal.  Posterior Fossa, vertebral arteries, paraspinal tissues: Visualized posterior fossa is normal. Vertebral artery flow voids are preserved. No prevertebral effusion.  Disc levels: Sagittal imaging includes the atlantoaxial joint to the level of the T1-2 disc space, with axial imaging of the disc spaces from C2-3 to C7-T1.  C2-3: Right-greater-than-left facet hypertrophy. No spinal canal or neural foraminal stenosis.  C3-4: Small disc osteophyte complex with moderate right foraminal stenosis. There is effacement of the ventral thecal sac with mild spinal canal  stenosis. No left foraminal stenosis.  C4-5: Moderate right facet hypertrophy. Small disc bulge with bilateral uncovertebral hypertrophy and superimposed left subarticular disc protrusion. Mild spinal canal stenosis. Moderate right foraminal stenosis.  C5-6: Intermediate central disc protrusion with narrowing of the ventral thecal sac and moderate spinal canal stenosis. Bilateral uncovertebral hypertrophy contributes to mild right and severe left neural foraminal stenosis.  C6-7: Mild disc bulge and mild left foraminal stenosis. No central spinal canal stenosis.  C7-T1: Small disc bulge without stenosis.  T1-T2: No spinal canal stenosis.  T2-T3: Mild left foraminal stenosis.  IMPRESSION: 1. C5-6 moderate spinal canal stenosis with mild right and severe left neural foraminal stenosis. 2. C4-5 mild spinal canal stenosis and moderate right neural foraminal stenosis. 3. C3-4 mild spinal canal stenosis.   Electronically Signed   By: Ulyses Jarred M.D.   On: 07/06/2018 22:50   IMPRESSION:  Nerve conduction studies done on both upper extremities shows evidence of bilateral carpal tunnel syndrome of mild to moderate severity bilaterally. EMG evaluation of the left upper extremity shows no evidence of an overlying cervical radiculopathy.  Jill Alexanders MD 03/03/2018 3:44 PM   She reports that she has been smoking cigarettes. She has a 9.00 pack-year smoking history. She has never used smokeless tobacco.  Recent Labs    10/18/17  1045  HGBA1C 6.4    Objective:  VS:  HT:5\' 5"  (165.1 cm)    WT:219 lb (99.3 kg)   BMI:36.44     BP:140/85   HR:65bpm   TEMP: ( )   RESP:  Physical Exam Vitals signs and nursing note reviewed.  Constitutional:      General: She is not in acute distress.    Appearance: Normal appearance. She is well-developed.  HENT:     Head: Normocephalic and atraumatic.     Nose: Nose normal.     Mouth/Throat:     Mouth: Mucous membranes are  moist.     Pharynx: Oropharynx is clear.  Eyes:     Conjunctiva/sclera: Conjunctivae normal.     Pupils: Pupils are equal, round, and reactive to light.  Neck:     Musculoskeletal: Neck supple. Muscular tenderness present.  Cardiovascular:     Rate and Rhythm: Regular rhythm.  Pulmonary:     Effort: Pulmonary effort is normal. No respiratory distress.  Abdominal:     General: There is no distension.     Palpations: Abdomen is soft.     Tenderness: There is no guarding.  Musculoskeletal:     Right lower leg: No edema.     Left lower leg: No edema.     Comments: Patient sits with forward flexed cervical spine she does have pain with rotation to the right more than left reticulate in range.  She has a negative Spurling's test bilaterally and a negative Hoffmann's test bilaterally.  She has decent shoulder range of motion without much impingement.  She has good symmetric strength bilaterally.  There is well-healed carpal tunnel release scar.  She has no swelling or allodynia.  Skin:    General: Skin is warm and dry.     Findings: No erythema or rash.  Neurological:     General: No focal deficit present.     Mental Status: She is alert and oriented to person, place, and time.     Motor: No abnormal muscle tone.     Coordination: Coordination normal.     Gait: Gait normal.  Psychiatric:        Mood and Affect: Mood normal.        Behavior: Behavior normal.        Thought Content: Thought content normal.     Ortho Exam Imaging: No results found.  Past Medical/Family/Surgical/Social History: Medications & Allergies reviewed per EMR, new medications updated. 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   Claudication ([place]) 09/03/2016   Soreness breast 08/10/2016   TIA (transient ischemic attack) 08/10/2016   History of cervical dysplasia 01/18/2016   Diabetes ([place]) 04/17/2015   Healthcare maintenance 04/17/2015   Tobacco use disorder 04/17/2015   Cervicalgia 11/16/2014   Acute pain of right shoulder 11/03/2014   Fibromyalgia 06/18/2014   Preventative health care 01/21/2014   Goiter 02/28/2013   Tobacco abuse 01/18/2012   FIBROIDS, UTERUS 03/09/2010   DEPRESSION 12/26/2006   GERD 12/26/2006   Past Medical History:  Diagnosis Date   Anxiety    Bilateral carpal tunnel syndrome 03/03/2018   Bronchitis    Diverticulitis  Fibromyalgia    GERD (gastroesophageal reflux disease)    Headache    Substance abuse (HCC)    10 years ago ( cocaine)   Family History  Problem Relation Age of Onset   Cancer Father        lung   Hypertension Mother    Arthritis Mother    Other Neg Hx        pituitary problem   Past Surgical History:  Procedure Laterality Date   ANKLE SURGERY     BACK SURGERY     Bowel obstruction     CARPAL TUNNEL RELEASE     CERVICAL CONIZATION W/BX N/A 12/27/2015   Procedure: CONIZATION CERVIX WITH BIOPSY;  Surgeon: Terrance Mass, MD;  Location: [facility];  Service: Gynecology;  Laterality: N/A;   DILATATION & CURETTAGE/HYSTEROSCOPY WITH MYOSURE N/A 12/27/2015   Procedure: DILATATION & CURETTAGE/HYSTEROSCOPY WITH MYOSURE;  Surgeon: Terrance Mass, MD;  Location: [facility];  Service: Gynecology;  Laterality: N/A;   ECTOPIC PREGNANCY SURGERY     EYE SURGERY     removed right eye;    EYE SURGERY     Social History   Occupational History   Not on file  Tobacco Use   Smoking status: Current Every Day Smoker    Packs/day: 0.50    Years: 18.00    Pack years: 9.00    Types: Cigarettes   Smokeless tobacco: Never Used   Tobacco  comment: 1-800 number given   Substance and Sexual Activity   Alcohol use: Yes    Alcohol/week: 0.0 standard drinks    Comment: HOLIDAYS   Drug use: No    Comment: 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

## 2018-08-21 NOTE — Telephone Encounter (Signed)
Please call patient and write work note. Thank you.

## 2018-08-21 NOTE — Telephone Encounter (Signed)
Please advise 

## 2018-08-21 NOTE — Telephone Encounter (Signed)
Ok to write letter-might need to call pt to get specifics

## 2018-08-21 NOTE — Telephone Encounter (Signed)
Faxed to number below and patient notified.

## 2018-08-22 ENCOUNTER — Encounter (INDEPENDENT_AMBULATORY_CARE_PROVIDER_SITE_OTHER): Payer: Self-pay | Admitting: Orthopaedic Surgery

## 2018-08-22 ENCOUNTER — Telehealth (INDEPENDENT_AMBULATORY_CARE_PROVIDER_SITE_OTHER): Payer: Self-pay | Admitting: Orthopaedic Surgery

## 2018-08-22 NOTE — Telephone Encounter (Signed)
Faxed new note

## 2018-08-22 NOTE — Telephone Encounter (Signed)
Patient called stating she needs a new letter to be faxed to her employer.  Patient states in order to return to work she needs the letter to say that she can return to work regular duties with no restrictions.  Patient is requesting the letter be faxed today if possible so she can return to work on Monday, 3/16.  Fax [phone] Attn: [employer]

## 2018-09-01 ENCOUNTER — Encounter (INDEPENDENT_AMBULATORY_CARE_PROVIDER_SITE_OTHER): Payer: Medicare Other | Admitting: Physical Medicine and Rehabilitation

## 2018-09-03 ENCOUNTER — Telehealth: Payer: Self-pay | Admitting: Family Medicine

## 2018-09-03 NOTE — Telephone Encounter (Signed)
Patient calling in for concern of swollen toe x2 days.  States that she bumped her toe on Monday and ever since it is been progressively worsening and swelling.  Tried an Epson salt bath but this did not help.  States that it continues to get bigger.  No color change no leakage of fluid.  Does report that is very painful especially when she puts pressure on her foot.  Does states warm but not very hot.  These concerns I am concerned that patient may have fractured her toe as she is unable to walk on it and is continuing to swell.  I do think it is important that she be evaluated by a physician and patient also would like this appointment.  Have schedule appointment for 8:30 in the morning on 8/26 so that patient can be seen but also avoid excessive exposure to others that are sick.  Will forward to ATC provider.   Dalphine Handing, PGY-2 [facility] 09/03/2018 2:55 PM

## 2018-09-04 ENCOUNTER — Ambulatory Visit (INDEPENDENT_AMBULATORY_CARE_PROVIDER_SITE_OTHER): Payer: Medicare Other | Admitting: Family Medicine

## 2018-09-04 ENCOUNTER — Ambulatory Visit (HOSPITAL_COMMUNITY)
Admission: RE | Admit: 2018-09-04 | Discharge: 2018-09-04 | Disposition: A | Discharge status: Home / Self Care | Payer: Medicare Other | Source: Ambulatory Visit | Attending: Family Medicine | Admitting: Family Medicine

## 2018-09-04 ENCOUNTER — Other Ambulatory Visit: Payer: Self-pay

## 2018-09-04 VITALS — BP 110/70 | HR 66 | Temp 97.8°F | Wt 218.0 lb

## 2018-09-04 DIAGNOSIS — M25375 Other instability, left foot: Secondary | ICD-10-CM | POA: Insufficient documentation

## 2018-09-04 DIAGNOSIS — M79672 Pain in left foot: Secondary | ICD-10-CM | POA: Diagnosis not present

## 2018-09-04 DIAGNOSIS — F17200 Nicotine dependence, unspecified, uncomplicated: Secondary | ICD-10-CM | POA: Diagnosis not present

## 2018-09-04 DIAGNOSIS — E7800 Pure hypercholesterolemia, unspecified: Secondary | ICD-10-CM | POA: Diagnosis not present

## 2018-09-04 MED ORDER — PRAVASTATIN SODIUM 20 MG PO TABS: 20.0000 mg | ORAL_TABLET | Freq: Every day | ORAL | 3 refills | Status: DC

## 2018-09-04 MED ORDER — TRAMADOL HCL 50 MG PO TABS: 50.0000 mg | ORAL_TABLET | Freq: Three times a day (TID) | ORAL | 0 refills | Status: AC | PRN

## 2018-09-04 NOTE — Assessment & Plan Note (Signed)
ASCVD risk is 11% with current data. Recommended restarting statin at 20mg  pravastatin c/w hx of cramping with lipitor. Would recheck lipids in 6 weeks.

## 2018-09-04 NOTE — Progress Notes (Signed)
   CC: toe injury  HPI  L last two toes: injury was by kicking the end of the bed. This happened Monday. Never had any toe injuries in the past. Her foot was swollen, but it is improved this morning. She has tried soaking it in hot water and alcohol. She has used APAP frequently.   Cholesterol concerns - started lipitor about a year ago, and she had cramps in her legs a few days after taking it. So she stopped. Now has been taking fish oil off and on. She is worried bc this is a silent killer and her mom has hx of HLD. She is still smoking.   ROS: Denies CP, SOB, abdominal pain, dysuria, changes in BMs.   CC, SH/smoking status, and VS noted  Objective: BP 110/70   Pulse 66   Temp 97.8 F (36.6 C) (Oral)   Wt 218 lb (98.9 kg)   SpO2 95%   BMI 36.28 kg/m  Gen: NAD, alert, cooperative, and pleasant. HEENT: NCAT, EOMI, PERRL CV: RRR, no murmur Resp: CTAB, no wheezes, non-labored Ext: No edema, warm. L foot: no swelling or brusing. L 5th toe TTP, L 4th toe TTP, most tender over MTP and distal 4th metatarsal. No overlying skin changes. No pain with squeezing across metatarsals.  Neuro: Alert and oriented, Speech clear, No gross deficits  Assessment and plan:  MTP pain: c/w possible fracture of 4th MTP, XR ordered. Placed in post op shoe and tramadol sent for 5 day supply. Will call with results.   Tobacco use disorder Encouraged cessation.   Hyperlipidemia ASCVD risk is 11% with current data. Recommended restarting statin at 20mg  pravastatin c/w hx of cramping with lipitor. Would recheck lipids in 6 weeks.    Orders Placed This Encounter  Procedures  . DG Foot Complete Left    Standing Status:   Future    Number of Occurrences:   1    Standing Expiration Date:   11/04/2019    Order Specific Question:   Reason for Exam (SYMPTOM  OR DIAGNOSIS REQUIRED)    Answer:   trauma, c/w possible MTP Fracture    Order Specific Question:   Is patient pregnant?    Answer:   No    Order  Specific Question:   Preferred imaging location?    Answer:   [facility]    Order Specific Question:   Radiology Contrast Protocol - do NOT remove file path    Answer:   [email or web address]    Meds ordered this encounter  Medications  . pravastatin (PRAVACHOL) 20 MG tablet    Sig: Take 1 tablet (20 mg total) by mouth daily.    Dispense:  90 tablet    Refill:  3  . traMADol (ULTRAM) 50 MG tablet    Sig: Take 1 tablet (50 mg total) by mouth every 8 (eight) hours as needed for up to 5 days.    Dispense:  15 tablet    Refill:  0     Ralene Ok, MD, PGY3 09/04/2018 3:21 PM

## 2018-09-04 NOTE — Assessment & Plan Note (Signed)
Encouraged cessation.

## 2018-09-04 NOTE — Patient Instructions (Signed)
It was a pleasure to see you today! Thank you for choosing [facility] for your primary care. Alyssa Montgomery was seen for foot pain, cholesterol.   Our plans for today were:  Please do what you can to decrease how much you are smoking.   You should wear the hard soled shoe for the next 3 weeks or so and call us back to let us know how you are doing with it. I sent tramadol for pain. Expect it to take up to 12 weeks for your foot to feel back to normal.   You need to go get your xray today.   You should start the new cholesterol medication, and taking it at night sometimes helps people who have had cramping in the past.    Best,  Dr. Lindell Noe

## 2018-09-15 ENCOUNTER — Telehealth (INDEPENDENT_AMBULATORY_CARE_PROVIDER_SITE_OTHER): Payer: Self-pay | Admitting: Orthopaedic Surgery

## 2018-09-15 ENCOUNTER — Other Ambulatory Visit (INDEPENDENT_AMBULATORY_CARE_PROVIDER_SITE_OTHER): Payer: Self-pay | Admitting: Orthopaedic Surgery

## 2018-09-15 MED ORDER — DICLOFENAC SODIUM 1 % TD GEL: TRANSDERMAL | 4 refills | Status: DC

## 2018-09-15 NOTE — Telephone Encounter (Signed)
Spoke with patient. [facility]

## 2018-09-15 NOTE — Telephone Encounter (Signed)
Please advise 

## 2018-09-15 NOTE — Telephone Encounter (Signed)
Ok to refill 

## 2018-09-18 ENCOUNTER — Other Ambulatory Visit (INDEPENDENT_AMBULATORY_CARE_PROVIDER_SITE_OTHER): Payer: Self-pay | Admitting: *Deleted

## 2018-09-18 ENCOUNTER — Other Ambulatory Visit: Payer: Self-pay

## 2018-09-18 ENCOUNTER — Ambulatory Visit (INDEPENDENT_AMBULATORY_CARE_PROVIDER_SITE_OTHER): Payer: Medicare Other

## 2018-09-18 ENCOUNTER — Ambulatory Visit (INDEPENDENT_AMBULATORY_CARE_PROVIDER_SITE_OTHER): Payer: Medicare Other | Admitting: Orthopaedic Surgery

## 2018-09-18 ENCOUNTER — Encounter (INDEPENDENT_AMBULATORY_CARE_PROVIDER_SITE_OTHER): Payer: Self-pay | Admitting: Orthopaedic Surgery

## 2018-09-18 VITALS — BP 123/94 | HR 79 | Ht 65.0 in | Wt 218.0 lb

## 2018-09-18 DIAGNOSIS — M25552 Pain in left hip: Secondary | ICD-10-CM

## 2018-09-18 DIAGNOSIS — M5442 Lumbago with sciatica, left side: Secondary | ICD-10-CM

## 2018-09-18 DIAGNOSIS — G8929 Other chronic pain: Secondary | ICD-10-CM

## 2018-09-18 DIAGNOSIS — M5441 Lumbago with sciatica, right side: Secondary | ICD-10-CM

## 2018-09-18 DIAGNOSIS — M25551 Pain in right hip: Secondary | ICD-10-CM

## 2018-09-18 HISTORY — DX: Other chronic pain: G89.29

## 2018-09-18 MED ORDER — TRAMADOL HCL 50 MG PO TABS: 50.0000 mg | ORAL_TABLET | Freq: Every evening | ORAL | 0 refills | Status: DC | PRN

## 2018-09-18 MED ORDER — CYCLOBENZAPRINE HCL 5 MG PO TABS: 5.0000 mg | ORAL_TABLET | Freq: Three times a day (TID) | ORAL | 0 refills | Status: DC | PRN

## 2018-09-18 NOTE — Progress Notes (Signed)
Office Visit Note   Patient: Alyssa Montgomery           Date of Birth: [birth year]           MRN: [id number] Visit Date: 09/18/2018              Requested by: Everrett Coombe, MD [address] [place]  [address] PCP: Everrett Coombe, MD   Assessment & Plan: Visit Diagnoses:  1. Chronic bilateral low back pain with bilateral sciatica   2. Bilateral hip pain     Plan: Osteoarthritis left hip.  I believe this is what is causing her pain.  Long discussion over 30 minutes regarding her diagnosis and treatment options.  We will try Flexeril and tramadol at night and limit her prescriptions.  Consider intra-articular cortisone injection when available also discussed hip replacement which I do not think at this point is an option but at some point might be.  I do not think that her problem is referred from her lumbar spine.  Return in 4 to 6 weeks  Follow-Up Instructions: No follow-ups on file.   Orders:  Orders Placed This Encounter  Procedures  . XR Lumbar Spine 2-3 Views  . XR Pelvis 1-2 Views   No orders of the defined types were placed in this encounter.     Procedures: No procedures performed   Clinical Data: No additional findings.   Subjective: Chief Complaint  Patient presents with  . Left Hip - Pain  Patient presents today with left hip pain. She said that it has been hurting on the lateral side and into her groin for months. The pain has more recently started to radiate down the lateral side of her leg. She is taking OTC medicine for pain, but wants something stronger. She states that her left leg feels weak.  Pain was initially insidious in onset and localized along the left side of the lumbar spine.  Over period of several months it seems to have slowly progressed to involve pain along the lateral aspect of her hip and her left groin.  She is not having right-sided symptoms.  She is also had some tingling in her left foot "on occasion".  She is not diabetic.  No  injury or trauma.  HPI  Review of Systems  Constitutional: Negative for fatigue.  HENT: Negative for ear pain.   Eyes: Negative for pain.  Respiratory: Negative for shortness of breath.   Cardiovascular: Negative for leg swelling.  Gastrointestinal: Negative for constipation and diarrhea.  Endocrine: Negative for cold intolerance and heat intolerance.  Genitourinary: Negative for difficulty urinating.  Musculoskeletal: Negative for joint swelling.  Skin: Negative for rash.  Allergic/Immunologic: Negative for food allergies.  Neurological: Negative for weakness.  Hematological: Does not bruise/bleed easily.  Psychiatric/Behavioral: Positive for sleep disturbance.     Objective: Vital Signs: BP (!) 123/94   Pulse 79   Ht 5\' 5"  (1.651 m)   Wt 218 lb (98.9 kg)   BMI 36.28 kg/m   Physical Exam Constitutional:      Appearance: She is well-developed.  Eyes:     Pupils: Pupils are equal, round, and reactive to light.  Pulmonary:     Effort: Pulmonary effort is normal.  Skin:    General: Skin is warm and dry.  Neurological:     Mental Status: She is alert and oriented to person, place, and time.  Psychiatric:        Behavior: Behavior normal.  Ortho Exam awake alert and oriented x3.  Comfortable sitting.  Has some mild decrease internal/external rotation of her right hip but notes difference on the left.  On the left she does have some pain in her groin with motion of her left hip and along the lateral aspect of her hip in the area of the greater trochanter.  Straight leg raise negative.  Motor and sensory exam intact.  Some percussible tenderness along the lower lumbar spine.  Straight leg raise negative Specialty Comments:  No specialty comments available.  Imaging: No results found.   PMFS History: Patient Active Problem List   Diagnosis Date Noted  . Bilateral hip pain 09/18/2018  . Chronic bilateral low back pain with bilateral sciatica 09/18/2018  . Spinal  stenosis of cervical region 07/29/2018  . Cervical spondylosis without myelopathy 07/29/2018  . Cervicogenic headache 07/29/2018  . Bilateral carpal tunnel syndrome 03/03/2018  . Left carpal tunnel syndrome 01/27/2018  . Complaints of total body pain 12/20/2017  . Hyperlipidemia 11/14/2017  . Anxiety 11/13/2016  . 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 Claudication ([place]) 09/03/2016  . Soreness breast 08/10/2016  . TIA (transient ischemic attack) 08/10/2016  . History of cervical dysplasia 01/18/2016  . Diabetes ([place]) 04/17/2015  . Healthcare maintenance 04/17/2015  . Tobacco use disorder 04/17/2015  . Cervicalgia 11/16/2014  . Acute pain of right shoulder 11/03/2014  . Fibromyalgia 06/18/2014  . Preventative health care 01/21/2014  . Goiter 02/28/2013  . Tobacco abuse 01/18/2012  . FIBROIDS, UTERUS 03/09/2010  . DEPRESSION 12/26/2006  . GERD 12/26/2006   Past Medical History:  Diagnosis Date  . Anxiety   . Bilateral carpal tunnel syndrome 03/03/2018  . Bronchitis   . Diverticulitis   . Fibromyalgia   . GERD (gastroesophageal reflux disease)   . Headache   . Substance abuse ([place])    10 years ago ( cocaine)    Family History  Problem Relation Age of Onset  . Cancer Father        lung  . Hypertension Mother   . Arthritis Mother   . Other Neg Hx        pituitary problem    Past Surgical History:  Procedure Laterality Date  . ANKLE SURGERY    . BACK SURGERY    . Bowel obstruction    . CARPAL TUNNEL RELEASE    . CERVICAL CONIZATION W/BX N/A 12/27/2015   Procedure: CONIZATION CERVIX WITH BIOPSY;  Surgeon: Terrance Mass, MD;  Location: [facility];  Service: Gynecology;  Laterality: N/A;  . DILATATION & CURETTAGE/HYSTEROSCOPY WITH MYOSURE N/A 12/27/2015   Procedure: DILATATION & CURETTAGE/HYSTEROSCOPY WITH MYOSURE;  Surgeon: Terrance Mass, MD;  Location: [facility];  Service: Gynecology;  Laterality: N/A;  . ECTOPIC PREGNANCY SURGERY    . EYE SURGERY     removed right  eye;   . EYE SURGERY     Social History   Occupational History  . Not on file  Tobacco Use  . Smoking status: Current Every Day Smoker    Packs/day: 0.50    Years: 18.00    Pack years: 9.00    Types: Cigarettes  . Smokeless tobacco: Never Used  . Tobacco comment: 1-800 number given   Substance and Sexual Activity  . Alcohol use: Yes    Alcohol/week: 0.0 standard drinks    Comment: HOLIDAYS  . 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 Sexual activity: Yes    Partners: Male  Birth control/protection: Post-menopausal

## 2018-10-13 ENCOUNTER — Encounter (INDEPENDENT_AMBULATORY_CARE_PROVIDER_SITE_OTHER): Payer: Self-pay | Admitting: Physical Medicine and Rehabilitation

## 2018-10-15 ENCOUNTER — Telehealth: Payer: Self-pay | Admitting: Orthopaedic Surgery

## 2018-10-15 NOTE — Telephone Encounter (Signed)
Patient called requesting prescription refill of Tramadol and [facility] [address] [facility]  Patient has appointment with Dr. Ernestina Patches on 10/27/18.

## 2018-10-16 NOTE — Telephone Encounter (Signed)
Please advise 

## 2018-10-16 NOTE — Telephone Encounter (Signed)
Done

## 2018-10-16 NOTE — Telephone Encounter (Signed)
I called patient 

## 2018-10-17 ENCOUNTER — Other Ambulatory Visit: Payer: Self-pay | Admitting: *Deleted

## 2018-10-17 MED ORDER — CYCLOBENZAPRINE HCL 5 MG PO TABS: 5.0000 mg | ORAL_TABLET | Freq: Three times a day (TID) | ORAL | 0 refills | Status: DC | PRN

## 2018-10-17 MED ORDER — TRAMADOL HCL 50 MG PO TABS: 50.0000 mg | ORAL_TABLET | Freq: Every evening | ORAL | 0 refills | Status: DC | PRN

## 2018-10-23 ENCOUNTER — Other Ambulatory Visit: Payer: Self-pay | Admitting: *Deleted

## 2018-10-23 DIAGNOSIS — J209 Acute bronchitis, unspecified: Secondary | ICD-10-CM

## 2018-10-23 MED ORDER — ALBUTEROL SULFATE HFA 108 (90 BASE) MCG/ACT IN AERS: 2.0000 | INHALATION_SPRAY | Freq: Four times a day (QID) | RESPIRATORY_TRACT | 0 refills | Status: DC | PRN

## 2018-10-24 DIAGNOSIS — Z20828 Contact with and (suspected) exposure to other viral communicable diseases: Secondary | ICD-10-CM | POA: Diagnosis not present

## 2018-10-27 ENCOUNTER — Encounter: Payer: Self-pay | Admitting: Physical Medicine and Rehabilitation

## 2018-10-31 ENCOUNTER — Other Ambulatory Visit: Payer: Self-pay | Admitting: *Deleted

## 2018-10-31 DIAGNOSIS — H6503 Acute serous otitis media, bilateral: Secondary | ICD-10-CM

## 2018-11-04 MED ORDER — FLUTICASONE PROPIONATE 50 MCG/ACT NA SUSP: 1.0000 | Freq: Every day | NASAL | 5 refills | Status: DC

## 2018-11-17 ENCOUNTER — Encounter: Payer: Medicare Other | Admitting: Physical Medicine and Rehabilitation

## 2018-11-20 ENCOUNTER — Encounter: Payer: Self-pay | Admitting: Orthopaedic Surgery

## 2018-11-20 ENCOUNTER — Other Ambulatory Visit: Payer: Self-pay

## 2018-11-20 ENCOUNTER — Ambulatory Visit (INDEPENDENT_AMBULATORY_CARE_PROVIDER_SITE_OTHER): Payer: Medicare Other | Admitting: Orthopaedic Surgery

## 2018-11-20 VITALS — BP 135/89 | HR 69 | Ht 65.0 in | Wt 218.0 lb

## 2018-11-20 DIAGNOSIS — M25551 Pain in right hip: Secondary | ICD-10-CM

## 2018-11-20 DIAGNOSIS — M25552 Pain in left hip: Secondary | ICD-10-CM

## 2018-11-20 MED ORDER — OXYCODONE HCL 5 MG PO CAPS: 5.0000 mg | ORAL_CAPSULE | Freq: Two times a day (BID) | ORAL | 0 refills | Status: DC | PRN

## 2018-11-20 NOTE — Addendum Note (Signed)
Addended by: Lendon Collar on: 11/20/2018 11:34 AM   Modules accepted: Orders

## 2018-11-20 NOTE — Progress Notes (Signed)
Office Visit Note   Patient: Alyssa Montgomery           Date of Birth: [birth year]           MRN: [id number] Visit Date: 11/20/2018              Requested by: Everrett Coombe, MD [address] [place],  [place] [address] PCP: Everrett Coombe, MD   Assessment & Plan: Visit Diagnoses:  1. 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  She has not done well with tramadol.  Will prescribe Percocet and obtain intra-articular cortisone injection left hip.  Return in 1 month.  Outside chance that some of her pain may be referred from her back  Follow-Up Instructions: Return in about 1 month (around 12/20/2018).   Orders:  No orders of the defined types were placed in this encounter.  No orders of the defined types were placed in this encounter.     Procedures: No procedures performed   Clinical Data: No additional findings.   Subjective: Chief Complaint  Patient presents with  . Left Hip - Pain  Patient presents today with left hip pain X months. She was last here in April for her lower back and hips. She said that the pain is located in her buttock and travels into her groin and down her leg. She said that she wants something for the pain.. She has been taking Tramadol, tylenol, and Flexeril. She had x-rays on 09-18-18.   HPI  Review of Systems  Constitutional: Negative for fatigue.  HENT: Negative for ear pain.   Eyes: Negative for pain.  Respiratory: Positive for shortness of breath.   Cardiovascular: Negative for leg swelling.  Gastrointestinal: Positive for constipation and diarrhea.  Endocrine: Positive for heat intolerance. Negative for cold intolerance.  Genitourinary: Negative for difficulty urinating.  Musculoskeletal: Positive for joint swelling.  Skin: Negative for rash.  Allergic/Immunologic: Negative for food allergies.  Neurological: Positive for dizziness.  Hematological: Does  not bruise/bleed easily.  Psychiatric/Behavioral: Positive for sleep disturbance.     Objective: Vital Signs: BP 135/89   Pulse 69   Ht 5\' 5"  (1.651 m)   Wt 218 lb (98.9 kg)   BMI 36.28 kg/m   Physical Exam Constitutional:      Appearance: She is well-developed.  Eyes:     Pupils: Pupils are equal, round, and reactive to light.  Pulmonary:     Effort: Pulmonary effort is normal.  Skin:    General: Skin is warm and dry.  Neurological:     Mental Status: She is alert and oriented to person, place, and time.  Psychiatric:        Behavior: Behavior normal.     Ortho Exam awake alert and oriented x3.  Comfortable sitting.  However difficulty with ambulation related pain in her left hip.  Does have some tenderness along the lateral aspect of her hip.  Has large legs.  Neurologically intact.  Some pain along the left superior buttock.  Considerable pain with internal and external rotation of her left hip  Specialty Comments:  No specialty comments available.  Imaging: No results found.   PMFS History: Patient Active Problem List   Diagnosis Date Noted  . Bilateral hip pain 09/18/2018  . Chronic bilateral low back pain with bilateral sciatica 09/18/2018  . Spinal stenosis of cervical region 07/29/2018  .  Cervical spondylosis without myelopathy 07/29/2018  . Cervicogenic headache 07/29/2018  . Bilateral carpal tunnel syndrome 03/03/2018  . Left carpal tunnel syndrome 01/27/2018  . Complaints of total body pain 12/20/2017  . Hyperlipidemia 11/14/2017  . Anxiety 11/13/2016  . 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 Claudication ([place]) 09/03/2016  . Soreness breast 08/10/2016  . TIA (transient ischemic attack) 08/10/2016  . History of cervical dysplasia 01/18/2016  . Diabetes ([place]) 04/17/2015  . Healthcare maintenance 04/17/2015  . Tobacco use disorder 04/17/2015  . Cervicalgia 11/16/2014  . Acute pain of right shoulder 11/03/2014  . Fibromyalgia 06/18/2014  .  Preventative health care 01/21/2014  . Goiter 02/28/2013  . Tobacco abuse 01/18/2012  . FIBROIDS, UTERUS 03/09/2010  . DEPRESSION 12/26/2006  . GERD 12/26/2006   Past Medical History:  Diagnosis Date  . Anxiety   . Bilateral carpal tunnel syndrome 03/03/2018  . Bronchitis   . Diverticulitis   . Fibromyalgia   . GERD (gastroesophageal reflux disease)   . Headache   . Substance abuse ([place])    10 years ago ( cocaine)    Family History  Problem Relation Age of Onset  . Cancer Father        lung  . Hypertension Mother   . Arthritis Mother   . Other Neg Hx        pituitary problem    Past Surgical History:  Procedure Laterality Date  . ANKLE SURGERY    . BACK SURGERY    . Bowel obstruction    . CARPAL TUNNEL RELEASE    . CERVICAL CONIZATION W/BX N/A 12/27/2015   Procedure: CONIZATION CERVIX WITH BIOPSY;  Surgeon: Terrance Mass, MD;  Location: [facility];  Service: Gynecology;  Laterality: N/A;  . DILATATION & CURETTAGE/HYSTEROSCOPY WITH MYOSURE N/A 12/27/2015   Procedure: DILATATION & CURETTAGE/HYSTEROSCOPY WITH MYOSURE;  Surgeon: Terrance Mass, MD;  Location: [facility];  Service: Gynecology;  Laterality: N/A;  . ECTOPIC PREGNANCY SURGERY    . EYE SURGERY     removed right eye;   . EYE SURGERY     Social History   Occupational History  . Not on file  Tobacco Use  . Smoking status: Current Every Day Smoker    Packs/day: 0.50    Years: 18.00    Pack years: 9.00    Types: Cigarettes  . Smokeless tobacco: Never Used  . Tobacco comment: 1-800 number given   Substance and Sexual Activity  . Alcohol use: Yes    Alcohol/week: 0.0 standard drinks    Comment: HOLIDAYS  . 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 Sexual activity: Yes    Partners: Male    Birth control/protection: Post-menopausal

## 2018-11-26 ENCOUNTER — Other Ambulatory Visit: Payer: Self-pay

## 2018-11-26 ENCOUNTER — Ambulatory Visit (INDEPENDENT_AMBULATORY_CARE_PROVIDER_SITE_OTHER): Payer: Medicare Other | Admitting: Physical Medicine and Rehabilitation

## 2018-11-26 ENCOUNTER — Ambulatory Visit: Payer: Self-pay

## 2018-11-26 ENCOUNTER — Encounter: Payer: Self-pay | Admitting: Physical Medicine and Rehabilitation

## 2018-11-26 DIAGNOSIS — M25552 Pain in left hip: Secondary | ICD-10-CM

## 2018-11-26 NOTE — Progress Notes (Signed)
  Numeric Pain Rating Scale and Functional Assessment Average Pain 10   In the last MONTH (on 0-10 scale) has pain interfered with the following?  1. General activity like being  able to carry out your everyday physical activities such as walking, climbing stairs, carrying groceries, or moving a chair?  Rating(10)    -Dye Allergies. 

## 2018-11-26 NOTE — Progress Notes (Signed)
Alyssa Montgomery - 58 y.o. female MRN [id number]  Date of birth: [birth year]  Office Visit Note: Visit Date: 11/26/2018 PCP: Everrett Coombe, MD Referred by: Everrett Coombe, MD  Subjective: Chief Complaint  Patient presents with  . Left Hip - Pain  . Left Thigh - Pain   HPI:  Alyssa Montgomery is a 58 y.o. female who comes in today At the request of Dr. Joni Fears for diagnostic and therapeutic left anesthetic hip arthrogram for left hip and groin pain.  ROS Otherwise per HPI.  Assessment & Plan: Visit Diagnoses: No diagnosis found.  Plan: No additional findings.   Meds & Orders: No orders of the defined types were placed in this encounter.  No orders of the defined types were placed in this encounter.   Follow-up: No follow-ups on file.   Procedures: Large Joint Inj: L hip joint on 11/26/2018 4:04 PM Indications: pain and diagnostic evaluation Details: 22 G needle, anterior approach  Arthrogram: Yes  Medications: 80 mg triamcinolone acetonide 40 MG/ML; 4 mL bupivacaine 0.25 % Outcome: tolerated well, no immediate complications  Arthrogram demonstrated excellent flow of contrast throughout the joint surface without extravasation or obvious defect.  The patient had relief of symptoms during the anesthetic phase of the injection.  Procedure, treatment alternatives, risks and benefits explained, specific risks discussed. Consent was given by the patient. Immediately prior to procedure a time out was called to verify the correct patient, procedure, equipment, support staff and site/side marked as required. Patient was prepped and draped in the usual sterile fashion.      No notes on file   Clinical History: MRI CERVICAL SPINE WITHOUT CONTRAST  TECHNIQUE: Multiplanar, multisequence MR imaging of the cervical spine was performed. No intravenous contrast was administered.  COMPARISON:  Cervical spine radiographs 06/26/2018  FINDINGS: Alignment: Reversal of normal cervical  lordosis.  Vertebrae: No focal marrow lesion. No compression fracture or evidence of discitis osteomyelitis.  Cord: Normal caliber and signal.  Posterior Fossa, vertebral arteries, paraspinal tissues: Visualized posterior fossa is normal. Vertebral artery flow voids are preserved. No prevertebral effusion.  Disc levels: Sagittal imaging includes the atlantoaxial joint to the level of the T1-2 disc space, with axial imaging of the disc spaces from C2-3 to C7-T1.  C2-3: Right-greater-than-left facet hypertrophy. No spinal canal or neural foraminal stenosis.  C3-4: Small disc osteophyte complex with moderate right foraminal stenosis. There is effacement of the ventral thecal sac with mild spinal canal stenosis. No left foraminal stenosis.  C4-5: Moderate right facet hypertrophy. Small disc bulge with bilateral uncovertebral hypertrophy and superimposed left subarticular disc protrusion. Mild spinal canal stenosis. Moderate right foraminal stenosis.  C5-6: Intermediate central disc protrusion with narrowing of the ventral thecal sac and moderate spinal canal stenosis. Bilateral uncovertebral hypertrophy contributes to mild right and severe left neural foraminal stenosis.  C6-7: Mild disc bulge and mild left foraminal stenosis. No central spinal canal stenosis.  C7-T1: Small disc bulge without stenosis.  T1-T2: No spinal canal stenosis.  T2-T3: Mild left foraminal stenosis.  IMPRESSION: 1. C5-6 moderate spinal canal stenosis with mild right and severe left neural foraminal stenosis. 2. C4-5 mild spinal canal stenosis and moderate right neural foraminal stenosis. 3. C3-4 mild spinal canal stenosis.   Electronically Signed   By: Ulyses Jarred M.D.   On: 07/06/2018 22:50   IMPRESSION:  Nerve conduction studies done on both upper extremities shows evidence of bilateral carpal tunnel syndrome of mild to moderate severity bilaterally. EMG evaluation of  the left upper extremity shows no evidence of an overlying cervical radiculopathy.  Jill Alexanders MD 03/03/2018 3:44 PM     Objective:  VS:  HT:    WT:   BMI:     BP:   HR: bpm  TEMP: ( )  RESP:  Physical Exam  Ortho Exam Imaging: No results found.

## 2018-12-08 ENCOUNTER — Telehealth: Payer: Self-pay | Admitting: Orthopaedic Surgery

## 2018-12-08 NOTE — Telephone Encounter (Signed)
Patient called stating the injection worked for about a week and now the pain has returned and it is worse than before.  Patient is requesting a return call to discuss next steps.  Patient is also requesting prescription refills of Flexeril and Oxycodone [facility] [address].

## 2018-12-09 ENCOUNTER — Other Ambulatory Visit: Payer: Self-pay | Admitting: Orthopaedic Surgery

## 2018-12-09 ENCOUNTER — Other Ambulatory Visit: Payer: Self-pay | Admitting: Orthopedic Surgery

## 2018-12-09 MED ORDER — OXYCODONE HCL 5 MG PO CAPS: 5.0000 mg | ORAL_CAPSULE | Freq: Two times a day (BID) | ORAL | 0 refills | Status: DC | PRN

## 2018-12-09 MED ORDER — CYCLOBENZAPRINE HCL 5 MG PO TABS: 5.0000 mg | ORAL_TABLET | Freq: Three times a day (TID) | ORAL | 0 refills | Status: DC | PRN

## 2018-12-09 NOTE — Telephone Encounter (Signed)
Call you please call patient and advise? Thank you.

## 2018-12-09 NOTE — Telephone Encounter (Signed)
Please advise 

## 2018-12-09 NOTE — Telephone Encounter (Signed)
Done

## 2018-12-09 NOTE — Telephone Encounter (Signed)
Spoke with patient and advised her that both scripts had [facility] on [address].

## 2018-12-09 NOTE — Telephone Encounter (Signed)
Ok to prescribe

## 2018-12-18 ENCOUNTER — Other Ambulatory Visit: Payer: Self-pay

## 2018-12-18 ENCOUNTER — Encounter: Payer: Self-pay | Admitting: Orthopaedic Surgery

## 2018-12-18 ENCOUNTER — Ambulatory Visit (INDEPENDENT_AMBULATORY_CARE_PROVIDER_SITE_OTHER): Payer: Medicare Other | Admitting: Orthopaedic Surgery

## 2018-12-18 VITALS — BP 124/77 | HR 79 | Resp 16 | Ht 65.0 in | Wt 210.0 lb

## 2018-12-18 DIAGNOSIS — M5441 Lumbago with sciatica, right side: Secondary | ICD-10-CM

## 2018-12-18 DIAGNOSIS — G8929 Other chronic pain: Secondary | ICD-10-CM | POA: Diagnosis not present

## 2018-12-18 DIAGNOSIS — M5442 Lumbago with sciatica, left side: Secondary | ICD-10-CM

## 2018-12-18 NOTE — Progress Notes (Signed)
Office Visit Note   Patient: Alyssa Montgomery           Date of Birth: 1960/10/10           MRN: [id number] Visit Date: 12/18/2018              Requested by: Wilber Oliphant, MD [address],  [place] [address] PCP: Wilber Oliphant, MD   Assessment & Plan: Visit Diagnoses:  1. Chronic bilateral low back pain with bilateral sciatica     Plan: Multiple joint arthralgias.  Presently having more trouble with the left hip and low back.  Had cortisone injection by Dr. Ernestina Patches in the left hip with about 4 days of relief.  Now having more trouble referable to her back.  Had an MRI scan in 2018 with foraminal stenosis at L2-3 and L3-4 to the left.  I think it is worth trying an epidural steroid injection.  Alyssa Montgomery will check with her primary care physicians regarding the lab work.  Long discussion regarding minimal use of pain medicines.  I think her pain is multifactorial and partly related to the arthritis of the left hip and I think at this point some issues with her back.  Follow-Up Instructions: Return After epidural steroid injection.   Orders:  No orders of the defined types were placed in this encounter.  No orders of the defined types were placed in this encounter.     Procedures: No procedures performed   Clinical Data: No additional findings.   Subjective: Chief Complaint  Patient presents with  . Right Hip - Pain  . Left Hip - Pain   Alyssa Montgomery is a 58 year old female who presents with bilateral hip pain. She fell 11/15/2018. She had a left hip injection with Dr. Ernestina Patches which helped 3-4 days. She takes Tylenol, pain patch and oxycodone for pain which helps. She wants 10 mg of oxycodone. She has numbness and weakness in the left leg and foot. She has difficulty sleeping at night. Now experiencing left paralumbar and sacral pain with referred discomfort in her buttock lateral left hip, left groin and left thigh.  Will also have some pain on the plantar aspect of both  of her feet.  Had an MRI scan in 2018 demonstrating foraminal stenosis at L2-3 and L3-4 mostly to the left.  She is also concerned that she might be diabetic and will check with her primary care physicians at [facility] health HPI  Review of Systems  Constitutional: Positive for fatigue.  HENT: Negative for trouble swallowing.   Eyes: Negative for pain.  Respiratory: Negative for shortness of breath.   Cardiovascular: Negative for leg swelling.  Gastrointestinal: Negative for constipation.  Endocrine: Negative for cold intolerance.  Genitourinary: Negative for difficulty urinating.  Musculoskeletal: Positive for back pain and gait problem.  Skin: Negative for rash.  Allergic/Immunologic: Negative for food allergies.  Neurological: Positive for weakness and numbness.  Hematological: Does not bruise/bleed easily.  Psychiatric/Behavioral: Positive for sleep disturbance.     Objective: Vital Signs: BP 124/77 (BP Location: Right Arm, Patient Position: Sitting, Cuff Size: Normal)   Pulse 79   Resp 16   Ht 5\' 5"  (1.651 m)   Wt 210 lb (95.3 kg)   BMI 34.95 kg/m   Physical Exam Constitutional:      Appearance: She is well-developed.  Eyes:     Pupils: Pupils are equal, round, and reactive to light.  Pulmonary:     Effort:  Pulmonary effort is normal.  Skin:    General: Skin is warm and dry.  Neurological:     Mental Status: She is alert and oriented to person, place, and time.  Psychiatric:        Behavior: Behavior normal.     Ortho Exam no pain with range of motion right hip did have some pain with range of motion of the left hip and very minimal loss of external rotation and internal rotation associated with groin pain.  Did have some percussible tenderness of the lumbar spine in the paralumbar region and to some extent over the greater trochanter of the left hip.  Motor and sensory exam intact  Specialty Comments:  No specialty comments available.  Imaging: No results found.    PMFS History: Patient Active Problem List   Diagnosis Date Noted  . Bilateral hip pain 09/18/2018  . Chronic bilateral low back pain with bilateral sciatica 09/18/2018  . Spinal stenosis of cervical region 07/29/2018  . Cervical spondylosis without myelopathy 07/29/2018  . Cervicogenic headache 07/29/2018  . Bilateral carpal tunnel syndrome 03/03/2018  . Left carpal tunnel syndrome 01/27/2018  . Complaints of total body pain 12/20/2017  . Hyperlipidemia 11/14/2017  . Anxiety 11/13/2016  . 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 Claudication ([place]) 09/03/2016  . Soreness breast 08/10/2016  . TIA (transient ischemic attack) 08/10/2016  . History of cervical dysplasia 01/18/2016  . Diabetes ([place]) 04/17/2015  . Healthcare maintenance 04/17/2015  . Tobacco use disorder 04/17/2015  . Cervicalgia 11/16/2014  . Acute pain of right shoulder 11/03/2014  . Fibromyalgia 06/18/2014  . Preventative health care 01/21/2014  . Goiter 02/28/2013  . Tobacco abuse 01/18/2012  . FIBROIDS, UTERUS 03/09/2010  . DEPRESSION 12/26/2006  . GERD 12/26/2006   Past Medical History:  Diagnosis Date  . Anxiety   . Bilateral carpal tunnel syndrome 03/03/2018  . Bronchitis   . Diverticulitis   . Fibromyalgia   . GERD (gastroesophageal reflux disease)   . Headache   . Substance abuse ([place])    10 years ago ( cocaine)    Family History  Problem Relation Age of Onset  . Cancer Father        lung  . Hypertension Mother   . Arthritis Mother   . Other Neg Hx        pituitary problem    Past Surgical History:  Procedure Laterality Date  . ANKLE SURGERY    . BACK SURGERY    . Bowel obstruction    . CARPAL TUNNEL RELEASE    . CERVICAL CONIZATION W/BX N/A 12/27/2015   Procedure: CONIZATION CERVIX WITH BIOPSY;  Surgeon: Terrance Mass, MD;  Location: [facility];  Service: Gynecology;  Laterality: N/A;  . DILATATION & CURETTAGE/HYSTEROSCOPY WITH MYOSURE N/A 12/27/2015   Procedure: DILATATION &  CURETTAGE/HYSTEROSCOPY WITH MYOSURE;  Surgeon: Terrance Mass, MD;  Location: [facility];  Service: Gynecology;  Laterality: N/A;  . ECTOPIC PREGNANCY SURGERY    . EYE SURGERY     removed right eye;   . EYE SURGERY     Social History   Occupational History  . Not on file  Tobacco Use  . Smoking status: Current Every Day Smoker    Packs/day: 0.50    Years: 18.00    Pack years: 9.00    Types: Cigarettes  . Smokeless tobacco: Never Used  . Tobacco comment: 1-800 number given   Substance and Sexual Activity  . Alcohol use: Yes    Alcohol/week:  0.0 standard drinks    Comment: HOLIDAYS  . 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 Sexual activity: Yes    Partners: Male    Birth control/protection: Post-menopausal

## 2018-12-31 ENCOUNTER — Telehealth: Payer: Self-pay | Admitting: Orthopaedic Surgery

## 2018-12-31 ENCOUNTER — Other Ambulatory Visit: Payer: Self-pay | Admitting: Orthopedic Surgery

## 2018-12-31 MED ORDER — OXYCODONE HCL 5 MG PO CAPS: 5.0000 mg | ORAL_CAPSULE | Freq: Two times a day (BID) | ORAL | 0 refills | Status: DC | PRN

## 2018-12-31 NOTE — Telephone Encounter (Signed)
Patient does not see Dr. Ernestina Patches until 01/12/2019. Patient request a [facility] [address]. Patient was not able to sleep last night to due to pain.

## 2018-12-31 NOTE — Telephone Encounter (Signed)
Please advise 

## 2018-12-31 NOTE — Telephone Encounter (Signed)
Rx escribed.

## 2019-01-01 NOTE — Telephone Encounter (Signed)
LMOM

## 2019-01-02 ENCOUNTER — Ambulatory Visit (INDEPENDENT_AMBULATORY_CARE_PROVIDER_SITE_OTHER): Payer: Medicare Other | Admitting: Family Medicine

## 2019-01-02 ENCOUNTER — Other Ambulatory Visit: Payer: Self-pay

## 2019-01-02 VITALS — BP 140/80 | HR 85 | Wt 216.4 lb

## 2019-01-02 DIAGNOSIS — E782 Mixed hyperlipidemia: Secondary | ICD-10-CM | POA: Diagnosis not present

## 2019-01-02 DIAGNOSIS — G720 Drug-induced myopathy: Secondary | ICD-10-CM

## 2019-01-02 DIAGNOSIS — Z131 Encounter for screening for diabetes mellitus: Secondary | ICD-10-CM | POA: Diagnosis not present

## 2019-01-02 DIAGNOSIS — R739 Hyperglycemia, unspecified: Secondary | ICD-10-CM | POA: Diagnosis not present

## 2019-01-02 DIAGNOSIS — M722 Plantar fascial fibromatosis: Secondary | ICD-10-CM

## 2019-01-02 DIAGNOSIS — Z0000 Encounter for general adult medical examination without abnormal findings: Secondary | ICD-10-CM | POA: Diagnosis not present

## 2019-01-02 DIAGNOSIS — T466X5A Adverse effect of antihyperlipidemic and antiarteriosclerotic drugs, initial encounter: Secondary | ICD-10-CM

## 2019-01-02 DIAGNOSIS — F17200 Nicotine dependence, unspecified, uncomplicated: Secondary | ICD-10-CM

## 2019-01-02 DIAGNOSIS — R7303 Prediabetes: Secondary | ICD-10-CM | POA: Diagnosis not present

## 2019-01-02 LAB — POCT GLYCOSYLATED HEMOGLOBIN (HGB A1C): HbA1c, POC (controlled diabetic range): 6.4 % (ref 0.0–7.0)

## 2019-01-02 MED ORDER — BUPROPION HCL ER (XL) 150 MG PO TB24: 150.0000 mg | ORAL_TABLET | Freq: Every day | ORAL | 0 refills | Status: DC

## 2019-01-02 MED ORDER — NAPROXEN 500 MG PO TABS: 500.0000 mg | ORAL_TABLET | Freq: Two times a day (BID) | ORAL | 0 refills | Status: DC

## 2019-01-02 NOTE — Patient Instructions (Addendum)
Dear Alyssa Montgomery,   It was good to see you! Thank you for taking your time to come in to be seen. Today, we discussed the following:   Muscle Cramping    I don't think that the muscle pain is from the pravastatin. We will check your liver values.   I have attached some information for muscle cramping.   Stay hydrated   Plantar Fasciitis    I attached some information about stretches to this packet   I have sent you Naproxen to the pharmacy. Take this when you have a lot of pain.   Tobacco Cessation   Start taking Wellbutrin. Take 1 pill (150 mg) daily for the first 3 days, then increase to 2 pills (300 mg) daily.   Follow up in two weeks for medication management.   Please follow up in two weeks or sooner for concerning or worsening symptoms.   Be well,   [name], M.D   [facility] [phone]  *Sign up for MyChart for instant access to your health profile, labs, orders, upcoming appointments or to contact your provider with questions*  =================================================================================== Some important items for Alyssa Montgomery's health:    Your Blood Pressure.  Should be LESS THAN 140/90 or 150/90 if you are over 65 BP Readings from Last 3 Encounters:  01/02/19 140/80  12/18/18 124/77  11/20/18 135/89    Your Weight History Wt Readings from Last 3 Encounters:  01/02/19 216 lb 6.4 oz (98.2 kg)  12/18/18 210 lb (95.3 kg)  11/20/18 218 lb (98.9 kg)    Body mass index is 36.01 kg/m.  BMI Classes Classification BMI Category (kg/m2)  Underweight < 18.5  Normal Weight 18.5-24.9  Overweight  25.0-29.9  Obese Class I 30.0-34.9  Obese Class II 35.0-39.9  Obese Class III  > or  = 40.0      Your last A1C     Every 3-6 months if you have diabetes Lab Results  Component Value Date   HGBA1C 6.4 10/18/2017    Your last Cholesterol   Every 1-5 years    Component Value Date/Time   CHOL 225 (H) 05/19/2018 1240    CHOL 219 (H) 10/18/2017 1229   HDL 73 05/19/2018 1240   HDL 63 10/18/2017 1229   LDLCALC 127 (H) 05/19/2018 1240   LDLDIRECT 106.4 10/11/2006 1347    Your last Blood Tests -  Once a year if you take medications    Component Value Date/Time   K 5.1 12/27/2017 1131   CREATININE 0.75 12/27/2017 1131   CREATININE 0.85 08/23/2014 1623   GLUCOSE 95 12/27/2017 1131    To Keep You Healthy Your are due for the following Health Maintenance Items:  Health Maintenance Due  Topic Date Due  . OPHTHALMOLOGY EXAM  09/08/1970  . URINE MICROALBUMIN  09/08/1969  . HEMOGLOBIN A1C  04/20/2018  . FOOT EXAM  10/19/2018    Please schedule an appointment with your healthcare provider for any questions or concerns regarding your health or any of the items above.   Plantar Fasciitis  Plantar fasciitis is a painful foot condition that affects the heel. It occurs when the band of tissue that connects the toes to the heel bone (plantar fascia) becomes irritated. This can happen as the result of exercising too much or doing other repetitive activities (overuse injury). The pain from plantar fasciitis can range from mild irritation to severe pain that makes it difficult to walk or move. The pain is usually worse  in the morning after sleeping, or after sitting or lying down for a while. Pain may also be worse after long periods of walking or standing. What are the causes? This condition may be caused by:  Standing for long periods of time.  Wearing shoes that do not have good arch support.  Doing activities that put stress on joints (high-impact activities), including running, aerobics, and ballet.  Being overweight.  An abnormal way of walking (gait).  Tight muscles in the back of your lower leg (calf).  High arches in your feet.  Starting a new athletic activity. What are the signs or symptoms? The main symptom of this condition is heel pain. Pain may:  Be worse with first steps after a time of  rest, especially in the morning after sleeping or after you have been sitting or lying down for a while.  Be worse after long periods of standing still.  Decrease after 30-45 minutes of activity, such as gentle walking. How is this diagnosed? This condition may be diagnosed based on your medical history and your symptoms. Your health care provider may ask questions about your activity level. Your health care provider will do a physical exam to check for:  A tender area on the bottom of your foot.  A high arch in your foot.  Pain when you move your foot.  Difficulty moving your foot. You may have imaging tests to confirm the diagnosis, such as:  X-rays.  Ultrasound.  MRI. How is this treated? Treatment for plantar fasciitis depends on how severe your condition is. Treatment may include:  Rest, ice, applying pressure (compression), and raising the affected foot (elevation). This may be called RICE therapy. Your health care provider may recommend RICE therapy along with over-the-counter pain medicines to manage your pain.  Exercises to stretch your calves and your plantar fascia.  A splint that holds your foot in a stretched, upward position while you sleep (night splint).  Physical therapy to relieve symptoms and prevent problems in the future.  Injections of steroid medicine (cortisone) to relieve pain and inflammation.  Stimulating your plantar fascia with electrical impulses (extracorporeal shock wave therapy). This is usually the last treatment option before surgery.  Surgery, if other treatments have not worked after 12 months. Follow these instructions at home:  Managing pain, stiffness, and swelling  If directed, put ice on the painful area: ? Put ice in a plastic bag, or use a frozen bottle of water. ? Place a towel between your skin and the bag or bottle. ? Roll the bottom of your foot over the bag or bottle. ? Do this for 20 minutes, 2-3 times a day.  Wear  athletic shoes that have air-sole or gel-sole cushions, or try wearing soft shoe inserts that are designed for plantar fasciitis.  Raise (elevate) your foot above the level of your heart while you are sitting or lying down. Activity  Avoid activities that cause pain. Ask your health care provider what activities are safe for you.  Do physical therapy exercises and stretches as told by your health care provider.  Try activities and forms of exercise that are easier on your joints (low-impact). Examples include swimming, water aerobics, and biking. General instructions  Take over-the-counter and prescription medicines only as told by your health care provider.  Wear a night splint while sleeping, if told by your health care provider. Loosen the splint if your toes tingle, become numb, or turn cold and blue.  Maintain a healthy weight,  or work with your health care provider to lose weight as needed.  Keep all follow-up visits as told by your health care provider. This is important. Contact a health care provider if you:  Have symptoms that do not go away after caring for yourself at home.  Have pain that gets worse.  Have pain that affects your ability to move or do your daily activities. Summary  Plantar fasciitis is a painful foot condition that affects the heel. It occurs when the band of tissue that connects the toes to the heel bone (plantar fascia) becomes irritated.  The main symptom of this condition is heel pain that may be worse after exercising too much or standing still for a long time.  Treatment varies, but it usually starts with rest, ice, compression, and elevation (RICE therapy) and over-the-counter medicines to manage pain. This information is not intended to replace advice given to you by your health care provider. Make sure you discuss any questions you have with your health care provider. Document Released: 02/20/2001 Document Revised: 05/10/2017 Document Reviewed:  03/25/2017 Elsevier Patient Education  2020 [place].  Plantar Fasciitis Rehab Ask your health care provider which exercises are safe for you. Do exercises exactly as told by your health care provider and adjust them as directed. It is normal to feel mild stretching, pulling, tightness, or discomfort as you do these exercises. Stop right away if you feel sudden pain or your pain gets worse. Do not begin these exercises until told by your health care provider. Stretching and range-of-motion exercises These exercises warm up your muscles and joints and improve the movement and flexibility of your foot. These exercises also help to relieve pain. Plantar fascia stretch  3. Sit with your left / right leg crossed over your opposite knee. 4. Hold your heel with one hand with that thumb near your arch. With your other hand, hold your toes and gently pull them back toward the top of your foot. You should feel a stretch on the bottom of your toes or your foot (plantar fascia) or both. 5. Hold this stretch for__________ seconds. 6. Slowly release your toes and return to the starting position. Repeat __________ times. Complete this exercise __________ times a day. Gastrocnemius stretch, standing This exercise is also called a calf (gastroc) stretch. It stretches the muscles in the back of the upper calf. 1. Stand with your hands against a wall. 2. Extend your left / right leg behind you, and bend your front knee slightly. 3. Keeping your heels on the floor and your back knee straight, shift your weight toward the wall. Do not arch your back. You should feel a gentle stretch in your upper left / right calf. 4. Hold this position for __________ seconds. Repeat __________ times. Complete this exercise __________ times a day. Soleus stretch, standing This exercise is also called a calf (soleus) stretch. It stretches the muscles in the back of the lower calf. 1. Stand with your hands against a  wall. 2. Extend your left / right leg behind you, and bend your front knee slightly. 3. Keeping your heels on the floor, bend your back knee and shift your weight slightly over your back leg. You should feel a gentle stretch deep in your lower calf. 4. Hold this position for __________ seconds. Repeat __________ times. Complete this exercise __________ times a day. Gastroc and soleus stretch, standing step This exercise stretches the muscles in the back of the lower leg. These muscles are in  the upper calf (gastrocnemius) and the lower calf (soleus). 1. Stand with the ball of your left / right foot on a step. The ball of your foot is on the walking surface, right under your toes. 2. Keep your other foot firmly on the same step. 3. Hold on to the wall or a railing for balance. 4. Slowly lift your other foot, allowing your body weight to press your left / right heel down over the edge of the step. You should feel a stretch in your left / right calf. 5. Hold this position for __________ seconds. 6. Return both feet to the step. 7. Repeat this exercise with a slight bend in your left / right knee. Repeat __________ times with your left / right knee straight and __________ times with your left / right knee bent. Complete this exercise __________ times a day. Balance exercise This exercise builds your balance and strength control of your arch to help take pressure off your plantar fascia. Single leg stand If this exercise is too easy, you can try it with your eyes closed or while standing on a pillow. 1. Without shoes, stand near a railing or in a doorway. You may hold on to the railing or door frame as needed. 2. Stand on your left / right foot. Keep your big toe down on the floor and try to keep your arch lifted. Do not let your foot roll inward. 3. Hold this position for __________ seconds. Repeat __________ times. Complete this exercise __________ times a day. This information is not intended to  replace advice given to you by your health care provider. Make sure you discuss any questions you have with your health care provider. Document Released: 05/28/2005 Document Revised: 09/18/2018 Document Reviewed: 03/26/2018 Elsevier Patient Education  [place].  Leg Cramps Leg cramps occur when one or more muscles tighten and you have no control over this tightening (involuntary muscle contraction). Muscle cramps can develop in any muscle, but the most common place is in the calf muscles of the leg. Those cramps can occur during exercise or when you are at rest. Leg cramps are painful, and they may last for a few seconds to a few minutes. Cramps may return several times before they finally stop. Usually, leg cramps are not caused by a serious medical problem. In many cases, the cause is not known. Some common causes include:  Excessive physical effort (overexertion), such as during intense exercise.  Overuse from repetitive motions, or doing the same thing over and over.  Staying in a certain position for a long period of time.  Improper preparation, form, or technique while performing a sport or an activity.  Dehydration.  Injury.  Side effects of certain medicines.  Abnormally low levels of minerals in your blood (electrolytes), especially potassium and calcium. This could result from: ? Pregnancy. ? Taking diuretic medicines. Follow these instructions at home: Eating and drinking  Drink enough fluid to keep your urine pale yellow. Staying hydrated may help prevent cramps.  Eat a healthy diet that includes plenty of nutrients to help your muscles function. A healthy diet includes fruits and vegetables, lean protein, whole grains, and low-fat or nonfat dairy products. Managing pain, stiffness, and swelling      Try massaging, stretching, and relaxing the affected muscle. Do this for several minutes at a time.  If directed, put ice on areas that are sore or painful  after a cramp: ? Put ice in a plastic bag. ? Place  a towel between your skin and the bag. ? Leave the ice on for 20 minutes, 2-3 times a day.  If directed, apply heat to muscles that are tense or tight. Do this before you exercise, or as often as told by your health care provider. Use the heat source that your health care provider recommends, such as a moist heat pack or a heating pad. ? Place a towel between your skin and the heat source. ? Leave the heat on for 20-30 minutes. ? Remove the heat if your skin turns bright red. This is especially important if you are unable to feel pain, heat, or cold. You may have a greater risk of getting burned.  Try taking hot showers or baths to help relax tight muscles. General instructions  If you are having frequent leg cramps, avoid intense exercise for several days.  Take over-the-counter and prescription medicines only as told by your health care provider.  Keep all follow-up visits as told by your health care provider. This is important. Contact a health care provider if:  Your leg cramps get more severe or more frequent, or they do not improve over time.  Your foot becomes cold, numb, or blue. Summary  Muscle cramps can develop in any muscle, but the most common place is in the calf muscles of the leg.  Leg cramps are painful, and they may last for a few seconds to a few minutes.  Usually, leg cramps are not caused by a serious medical problem. Often, the cause is not known.  Stay hydrated and take over-the-counter and prescription medicines only as told by your health care provider. This information is not intended to replace advice given to you by your health care provider. Make sure you discuss any questions you have with your health care provider. Document Released: 07/05/2004 Document Revised: 05/10/2017 Document Reviewed: 03/07/2017 Elsevier Patient Education  [place]. Bupropion extended-release tablets (Depression/Mood  Disorders) What is this medicine? BUPROPION (byoo PROE pee on) is used to treat depression. This medicine may be used for other purposes; ask your health care provider or pharmacist if you have questions. COMMON BRAND NAME(S): Aplenzin, Budeprion XL, Forfivo XL, Wellbutrin XL What should I tell my health care provider before I take this medicine? They need to know if you have any of these conditions:  an eating disorder, such as anorexia or bulimia  bipolar disorder or psychosis  diabetes or high blood sugar, treated with medication  glaucoma  head injury or brain tumor  heart disease, previous heart attack, or irregular heart beat  high blood pressure  kidney or liver disease  seizures (convulsions)  suicidal thoughts or a previous suicide attempt  Tourette's syndrome  weight loss  an unusual or allergic reaction to bupropion, other medicines, foods, dyes, or preservatives  breast-feeding  pregnant or trying to become pregnant How should I use this medicine? Take this medicine by mouth with a glass of water. Follow the directions on the prescription label. You can take it with or without food. If it upsets your stomach, take it with food. Do not crush, chew, or cut these tablets. This medicine is taken once daily at the same time each day. Do not take your medicine more often than directed. Do not stop taking this medicine suddenly except upon the advice of your doctor. Stopping this medicine too quickly may cause serious side effects or your condition may worsen. A special MedGuide will be given to you by the pharmacist with each  prescription and refill. Be sure to read this information carefully each time. Talk to your pediatrician regarding the use of this medicine in children. Special care may be needed. Overdosage: If you think you have taken too much of this medicine contact a poison control center or emergency room at once. NOTE: This medicine is only for you. Do not  share this medicine with others. What if I miss a dose? If you miss a dose, skip the missed dose and take your next tablet at the regular time. Do not take double or extra doses. What may interact with this medicine? Do not take this medicine with any of the following medications:  linezolid  MAOIs like Azilect, Carbex, Eldepryl, Marplan, Nardil, and Parnate  methylene blue (injected into a vein)  other medicines that contain bupropion like Zyban This medicine may also interact with the following medications:  alcohol  certain medicines for anxiety or sleep  certain medicines for blood pressure like metoprolol, propranolol  certain medicines for depression or psychotic disturbances  certain medicines for HIV or AIDS like efavirenz, lopinavir, nelfinavir, ritonavir  certain medicines for irregular heart beat like propafenone, flecainide  certain medicines for Parkinson's disease like amantadine, levodopa  certain medicines for seizures like carbamazepine, phenytoin, phenobarbital  cimetidine  clopidogrel  cyclophosphamide  digoxin  furazolidone  isoniazid  nicotine  orphenadrine  procarbazine  steroid medicines like prednisone or cortisone  stimulant medicines for attention disorders, weight loss, or to stay awake  tamoxifen  theophylline  thiotepa  ticlopidine  tramadol  warfarin This list may not describe all possible interactions. Give your health care provider a list of all the medicines, herbs, non-prescription drugs, or dietary supplements you use. Also tell them if you smoke, drink alcohol, or use illegal drugs. Some items may interact with your medicine. What should I watch for while using this medicine? Tell your doctor if your symptoms do not get better or if they get worse. Visit your doctor or healthcare provider for regular checks on your progress. Because it may take several weeks to see the full effects of this medicine, it is important  to continue your treatment as prescribed by your doctor. This medicine may cause serious skin reactions. They can happen weeks to months after starting the medicine. Contact your healthcare provider right away if you notice fevers or flu-like symptoms with a rash. The rash may be red or purple and then turn into blisters or peeling of the skin. Or, you might notice a red rash with swelling of the face, lips or lymph nodes in your neck or under your arms. Patients and their families should watch out for new or worsening thoughts of suicide or depression. Also watch out for sudden changes in feelings such as feeling anxious, agitated, panicky, irritable, hostile, aggressive, impulsive, severely restless, overly excited and hyperactive, or not being able to sleep. If this happens, especially at the beginning of treatment or after a change in dose, call your healthcare provider. Avoid alcoholic drinks while taking this medicine. Drinking large amounts of alcoholic beverages, using sleeping or anxiety medicines, or quickly stopping the use of these agents while taking this medicine may increase your risk for a seizure. Do not drive or use heavy machinery until you know how this medicine affects you. This medicine can impair your ability to perform these tasks. Do not take this medicine close to bedtime. It may prevent you from sleeping. Your mouth may get dry. Chewing sugarless gum or sucking hard candy,  and drinking plenty of water may help. Contact your doctor if the problem does not go away or is severe. The tablet shell for some brands of this medicine does not dissolve. This is normal. The tablet shell may appear whole in the stool. This is not a cause for concern. What side effects may I notice from receiving this medicine? Side effects that you should report to your doctor or health care professional as soon as possible:  allergic reactions like skin rash, itching or hives, swelling of the face, lips,  or tongue  breathing problems  changes in vision  confusion  elevated mood, decreased need for sleep, racing thoughts, impulsive behavior  fast or irregular heartbeat  hallucinations, loss of contact with reality  increased blood pressure  rash, fever, and swollen lymph nodes  redness, blistering, peeling or loosening of the skin, including inside the mouth  seizures  suicidal thoughts or other mood changes  unusually weak or tired  vomiting Side effects that usually do not require medical attention (report to your doctor or health care professional if they continue or are bothersome):  constipation  headache  loss of appetite  nausea  tremors  weight loss This list may not describe all possible side effects. Call your doctor for medical advice about side effects. You may report side effects to FDA at 1-800-FDA-1088. Where should I keep my medicine? Keep out of the reach of children. Store at room temperature between 15 and 30 degrees C (59 and 86 degrees F). Throw away any unused medicine after the expiration date. NOTE: This sheet is a summary. It may not cover all possible information. If you have questions about this medicine, talk to your doctor, pharmacist, or health care provider.  2020 Elsevier/Gold Standard (2018-08-21 13:45:31)

## 2019-01-02 NOTE — Progress Notes (Signed)
Established Patient - Acute Visit Subjective  Subjective  Patient ID: MRN [id number]  Date of birth: 1961-03-06   PCP: Wilber Oliphant, MD  CC: Medication follow up  HPI: Alyssa Montgomery is a 58 y.o. female with past medical history significant for Anxiety, depression, fibromyalgia, tobacco abuse, TIA, chronic back pain who presents today with the following problems:  HLD- medication follow up  Is supposed to be on pravastatin. Previously on lipitor but caused cramping. Has been off of the medicine for two weeks. She took one yesterday because her head was hurting and feeling sick. Did not have a lot of cramps last night.  Worries that the pravastatin might be causing her cramps.   Right Foot pain Location is at the heel. Has been occurring for two weeks. It hurts when she puts pressure on her heel. Its achy constantly but worse with pressure. She also reports pain when she stretches the middle of her foot. It hurts as soon as she wakes up in the morning. She did buy a didn't pair of flip flops that are not supportive about two weeks ago. Usually wears good, supportive shoes.   Tobacco abuse  Has not tried chantix at home. Continues to smoke and feel bad about it every day. She is too afraid to use the chantix because she saw a commercial about it and does not want to experience any of the side effects. She has not tried anything else to help with cessation.   HISTORY Medications, allergies, medical history, family history and social history were reviewed and edited as necessary. Pertinent findings included in HPI.  Social Hx: Alyssa Montgomery reports that she has been smoking cigarettes. She has a 9.00 pack-year smoking history. She has never used smokeless tobacco. She reports current alcohol use. She reports that she does not use drugs. ROS: See HPI    Objective   Objective  Physical Exam:  BP 140/80   Pulse 85   Wt 216 lb 6.4 oz (98.2 kg)   SpO2 98%   BMI 36.01 kg/m  General: NAD,  non-toxic, well-appearing, sitting comfortably in chair. When walking, has ataxic gait and limps on right side.    HEENT: Alyssa Montgomery/AT. PERRLA. EOMI.  Cardiovascular:  B/L 2+ RP and DP. No BLEE Respiratory: No IWOB.  Extremities: Warm and well perfused. Moving spontaneously. Right foot: TTP of right heel. Sensry and motor intact. No obvious sins of trauma.  Integumentary: No obvious rashes, lesions, trauma on general exam. Neuro: CN grossly intact. No FND   Pertinent Labs & Imaging:  High cholesterol and LDL on lipid panel in December 2019.   Assessment  Assessment & Plan  Tobacco use disorder Given patient's history of depression and anxiety, patient is agreeable to tryng wellbutrin for tobacco cessation. 150mg  XL for 3 days, then increase to 300 mg XL. Will follow up in two weeks. Can also pair with duloxetine for chronic pain in the future.   Hyperlipidemia Patient off of pravastatin at this time. It is unlikely that patient's muscle pain due to statin, given patient's HPI.  Patient is unsure whether she will restart this medication, though she says she likely will. Will order HFP   Prediabetes Patient's a1c previously consistent with diabetes. Today, patient's A1C is 6.4. Will discuss with patient about starting metformin for prediabetes.   Orders Placed This Encounter  Procedures  . Basic Metabolic Panel  . Hepatic Function Panel  . POCT glycosylated hemoglobin (Hb A1C)     Health Maintenance discussed  with patient and patient agrees to address when able.    Wilber Oliphant, M.D.  PGY-2  Family Medicine  [phone] 01/04/2019 1:50 PM

## 2019-01-03 LAB — HEPATIC FUNCTION PANEL
ALT: 18 IU/L (ref 0–32)
AST: 15 IU/L (ref 0–40)
Albumin: 4.1 g/dL (ref 3.8–4.9)
Alkaline Phosphatase: 106 IU/L (ref 39–117)
Bilirubin Total: 0.2 mg/dL (ref 0.0–1.2)
Bilirubin, Direct: 0.09 mg/dL (ref 0.00–0.40)
Total Protein: 6.4 g/dL (ref 6.0–8.5)

## 2019-01-03 LAB — BASIC METABOLIC PANEL
BUN/Creatinine Ratio: 16 (ref 9–23)
BUN: 13 mg/dL (ref 6–24)
CO2: 23 mmol/L (ref 20–29)
Calcium: 9.2 mg/dL (ref 8.7–10.2)
Chloride: 101 mmol/L (ref 96–106)
Creatinine, Ser: 0.81 mg/dL (ref 0.57–1.00)
GFR calc Af Amer: 92 mL/min/{1.73_m2} (ref >59)
GFR calc non Af Amer: 80 mL/min/{1.73_m2} (ref >59)
Glucose: 94 mg/dL (ref 65–99)
Potassium: 4.1 mmol/L (ref 3.5–5.2)
Sodium: 140 mmol/L (ref 134–144)

## 2019-01-04 ENCOUNTER — Encounter: Payer: Self-pay | Admitting: Family Medicine

## 2019-01-04 DIAGNOSIS — M722 Plantar fascial fibromatosis: Secondary | ICD-10-CM | POA: Insufficient documentation

## 2019-01-04 NOTE — Assessment & Plan Note (Signed)
Patient's a1c previously consistent with diabetes. Today, patient's A1C is 6.4. Will discuss with patient about starting metformin for prediabetes.

## 2019-01-04 NOTE — Assessment & Plan Note (Addendum)
Patient off of pravastatin at this time. It is unlikely that patient's muscle pain due to statin, given patient's HPI.  Patient is unsure whether she will restart this medication, though she says she likely will. Will order HFP

## 2019-01-04 NOTE — Assessment & Plan Note (Signed)
Given patient's history of depression and anxiety, patient is agreeable to tryng wellbutrin for tobacco cessation. 150mg  XL for 3 days, then increase to 300 mg XL. Will follow up in two weeks. Can also pair with duloxetine for chronic pain in the future.

## 2019-01-12 ENCOUNTER — Ambulatory Visit: Payer: Self-pay

## 2019-01-12 ENCOUNTER — Ambulatory Visit (INDEPENDENT_AMBULATORY_CARE_PROVIDER_SITE_OTHER): Payer: Medicare Other | Admitting: Physical Medicine and Rehabilitation

## 2019-01-12 ENCOUNTER — Encounter: Payer: Self-pay | Admitting: Physical Medicine and Rehabilitation

## 2019-01-12 VITALS — BP 124/90 | HR 68

## 2019-01-12 DIAGNOSIS — M5416 Radiculopathy, lumbar region: Secondary | ICD-10-CM | POA: Diagnosis not present

## 2019-01-12 DIAGNOSIS — M542 Cervicalgia: Secondary | ICD-10-CM | POA: Diagnosis not present

## 2019-01-12 DIAGNOSIS — M25552 Pain in left hip: Secondary | ICD-10-CM

## 2019-01-12 DIAGNOSIS — M5442 Lumbago with sciatica, left side: Secondary | ICD-10-CM

## 2019-01-12 DIAGNOSIS — M48061 Spinal stenosis, lumbar region without neurogenic claudication: Secondary | ICD-10-CM

## 2019-01-12 DIAGNOSIS — M25511 Pain in right shoulder: Secondary | ICD-10-CM

## 2019-01-12 DIAGNOSIS — M797 Fibromyalgia: Secondary | ICD-10-CM

## 2019-01-12 DIAGNOSIS — G8929 Other chronic pain: Secondary | ICD-10-CM

## 2019-01-12 MED ORDER — METHYLPREDNISOLONE ACETATE 80 MG/ML IJ SUSP
80.0000 mg | Freq: Once | INTRAMUSCULAR | Status: AC
  Administered 2019-01-12: 80 mg

## 2019-01-12 MED ORDER — TRIAMCINOLONE ACETONIDE 40 MG/ML IJ SUSP
80.0000 mg | INTRAMUSCULAR | Status: AC | PRN
  Administered 2018-11-26: 80 mg via INTRA_ARTICULAR

## 2019-01-12 MED ORDER — BUPIVACAINE HCL 0.25 % IJ SOLN
4.0000 mL | INTRAMUSCULAR | Status: AC | PRN
  Administered 2018-11-26: 4 mL via INTRA_ARTICULAR

## 2019-01-12 NOTE — Progress Notes (Signed)
.  Numeric Pain Rating Scale and Functional Assessment Average Pain 10   In the last MONTH (on 0-10 scale) has pain interfered with the following?  1. General activity like being  able to carry out your everyday physical activities such as walking, climbing stairs, carrying groceries, or moving a chair?  Rating(9)   +Driver, -BT, -Dye Allergies.  

## 2019-01-13 ENCOUNTER — Other Ambulatory Visit: Payer: Self-pay | Admitting: Family Medicine

## 2019-01-13 DIAGNOSIS — G8929 Other chronic pain: Secondary | ICD-10-CM | POA: Diagnosis not present

## 2019-01-13 DIAGNOSIS — M25511 Pain in right shoulder: Secondary | ICD-10-CM | POA: Diagnosis not present

## 2019-01-13 MED ORDER — BUPIVACAINE HCL 0.5 % IJ SOLN
3.0000 mL | INTRAMUSCULAR | Status: AC | PRN
  Administered 2019-01-13: 06:00:00 3 mL via INTRA_ARTICULAR

## 2019-01-13 MED ORDER — TRIAMCINOLONE ACETONIDE 40 MG/ML IJ SUSP
80.0000 mg | INTRAMUSCULAR | Status: AC | PRN
  Administered 2019-01-13: 80 mg via INTRA_ARTICULAR

## 2019-01-13 NOTE — Procedures (Signed)
Lumbar Epidural Steroid Injection - Interlaminar Approach with Fluoroscopic Guidance  Patient: Alyssa Montgomery      Date of Birth: 26-Aug-1960 MRN: [id number] PCP: Wilber Oliphant, MD      Visit Date: 01/12/2019   Universal Protocol:     Consent Given By: the patient  Position: PRONE  Additional Comments: Vital signs were monitored before and after the procedure. Patient was prepped and draped in the usual sterile fashion. The correct patient, procedure, and site was verified.   Injection Procedure Details:  Procedure Site One Meds Administered:  Meds ordered this encounter  Medications  . methylPREDNISolone acetate (DEPO-MEDROL) injection 80 mg     Laterality: Left  Location/Site:  L4-L5  Needle size: 20 G  Needle type: Tuohy  Needle Placement: Paramedian epidural  Findings:   -Comments: Excellent flow of contrast into the epidural space.  Patient reaction to parts of the injection and movement on the table very exaggerated compared to most patients.  Procedure Details: Using a paramedian approach from the side mentioned above, the region overlying the inferior lamina was localized under fluoroscopic visualization and the soft tissues overlying this structure were infiltrated with 4 ml. of 1% Lidocaine without Epinephrine. The Tuohy needle was inserted into the epidural space using a paramedian approach.   The epidural space was localized using loss of resistance along with lateral and bi-planar fluoroscopic views.  After negative aspirate for air, blood, and CSF, a 2 ml. volume of Isovue-250 was injected into the epidural space and the flow of contrast was observed. Radiographs were obtained for documentation purposes.    The injectate was administered into the level noted above.   Additional Comments:  No complications occurred Dressing: 2 x 2 sterile gauze and Band-Aid    Post-procedure details: Patient was observed during the procedure. Post-procedure  instructions were reviewed.  Patient left the clinic in stable condition.

## 2019-01-13 NOTE — Progress Notes (Signed)
Alyssa Montgomery - 58 y.o. female MRN [id number]  Date of birth: April 29, 1961  Office Visit Note: Visit Date: 01/12/2019 PCP: Wilber Oliphant, MD Referred by: Wilber Oliphant, MD  Subjective: Chief Complaint  Patient presents with   Lower Back - Pain   Left Thigh - Pain   HPI: Alyssa Montgomery is a 58 y.o. female who comes in today For reevaluation and management of a multitude of complaints.  She has ongoing low back and left hip pain which has been a concern for intra-articular hip problem versus low back problem.  She also has history of neck pain and right shoulder pain.  She has had MRI imaging of both the cervical spine and right shoulder and lumbar spine.  These were all reviewed with the patient today and reviewed below.  We spent approximately 30 minutes in general evaluating her pain complaints today.  She also has a history of fibromyalgia.  She is followed in the office by Dr. Durward Fortes mainly but also by Dr. Jean Rosenthal.  Last time I saw the patient we completed intra-articular hip injection that she said helped for a few days.  In speaking with her quite a bit she said it helped for 2 days and then nothing after that.  She has pain on the left low back and left hip and leg and somewhat of an L5 distribution.  She gets some paresthesias but she gets paresthesias in general.  She has had no specific trauma.  She has had remote back surgery by Dr. Rodell Perna.  This seems like maybe it was at L3-4 but there are no good records and she does not remember.  This was likely a discectomy looking back at the old notes.  This was probably from around 2003 or 2005.  MRI of the lumbar spine shows lateral recess narrowing but otherwise pretty decent lumbar spine.  Hip does show arthritic changes.  She denies much in the way of groin pain today.  Pain worse with standing and walking.  Rated as severe.  Secondary issue today is her right shoulder.  She has pain with movement of the shoulder.  She  does have neck pain with referral pain to the shoulder and sometimes distally.  No real symptoms in the hands right now but she has had that in the past and has carried a diagnosis of carpal tunnel syndrome in the past.  She has had prior electrodiagnostic studies.  MRI of the cervical spine does show moderate stenosis at C5-6.  Her biggest complaint today is her right shoulder.  No left shoulder pain.  No focal weakness.  She has had multiple bouts of physical therapy as well as medication management with no relief.   Review of Systems  Constitutional: Negative for chills, fever, malaise/fatigue and weight loss.  HENT: Negative for hearing loss and sinus pain.   Eyes: Negative for blurred vision, double vision and photophobia.  Respiratory: Negative for cough and shortness of breath.   Cardiovascular: Negative for chest pain, palpitations and leg swelling.  Gastrointestinal: Negative for abdominal pain, nausea and vomiting.  Genitourinary: Negative for flank pain.  Musculoskeletal: Positive for back pain, joint pain and neck pain. Negative for myalgias.  Skin: Negative for itching and rash.  Neurological: Positive for tingling. Negative for tremors, focal weakness and weakness.  Endo/Heme/Allergies: Negative.   Psychiatric/Behavioral: Negative for depression.  All other systems reviewed and are negative.  Otherwise per HPI.  Assessment & Plan: Visit Diagnoses:  1. Lumbar radiculopathy   2. Bilateral stenosis of lateral recess of lumbar spine   3. Pain in left hip   4. Chronic bilateral low back pain with bilateral sciatica   5. Cervicalgia   6. Fibromyalgia   7. Chronic right shoulder pain     Plan: Findings:  1.  Chronic worsening severe and really limiting low back and left hip and leg pain that I feel like probably is radicular in nature.  She got 1 day of relief with intra-articular injection I am not sure that is diagnostic at this point.  Dr. Durward Fortes is also monitoring her  improvement with these injections and he did want to see her in follow-up and I will ask her to call his office for follow-up.  We did complete interlaminar injection today at L4-5 on the right.  She did get some pain in the leg during the injection somewhat confirming that potentially this is the source of her problems.  I had a hard time convincing her that it does take a few days for this to work but is not instantaneous like the hip injection would be with the anesthetic.  Underlying problem of fibromyalgia is really complicating the issue at this point.  She has been managed with tramadol by Dr. Durward Fortes and this is been increased recently over the last couple of months to 5 mg of oxycodone prescribed by Biagio Borg.  2.  Neck pain and right shoulder pain is also multifactorial.  She does have cervical stenosis at C5-6 and she also has some issues with mild degenerative changes well as rotator cuff issues of the right glenohumeral joint.  We will go ahead today and complete a right intra-articular shoulder injection with fluoroscopic guidance diagnostically hopefully therapeutically.  Again she is to call a follow-up with Dr. Durward Fortes to watch this as well.    Meds & Orders:  Meds ordered this encounter  Medications   methylPREDNISolone acetate (DEPO-MEDROL) injection 80 mg    Orders Placed This Encounter  Procedures   Large Joint Inj: R glenohumeral   XR C-ARM NO REPORT   Epidural Steroid injection    Follow-up: Return in about 2 weeks (around 01/26/2019) for Joni Fears, MD.   Procedures: Large Joint Inj: R glenohumeral on 01/13/2019 5:43 AM Indications: pain and diagnostic evaluation Details: 22 G 3.5 in needle, fluoroscopy-guided anteromedial approach  Arthrogram: No  Medications: 80 mg triamcinolone acetonide 40 MG/ML; 3 mL bupivacaine 0.5 % Outcome: tolerated well, no immediate complications  There was excellent flow of contrast producing a partial arthrogram of the  glenohumeral joint. The patient did have relief of symptoms during the anesthetic phase of the injection. Procedure, treatment alternatives, risks and benefits explained, specific risks discussed. Consent was given by the patient. Immediately prior to procedure a time out was called to verify the correct patient, procedure, equipment, support staff and site/side marked as required. Patient was prepped and draped in the usual sterile fashion.      Lumbar Epidural Steroid Injection - Interlaminar Approach with Fluoroscopic Guidance  Patient: Alyssa Montgomery      Date of Birth: [birth year] MRN: [id number] PCP: Wilber Oliphant, MD      Visit Date: 01/12/2019   Universal Protocol:     Consent Given By: the patient  Position: PRONE  Additional Comments: Vital signs were monitored before and after the procedure. Patient was prepped and draped in the usual sterile fashion. The correct patient, procedure, and site was verified.   Injection  Procedure Details:  Procedure Site One Meds Administered:  Meds ordered this encounter  Medications   methylPREDNISolone acetate (DEPO-MEDROL) injection 80 mg     Laterality: Left  Location/Site:  L4-L5  Needle size: 20 G  Needle type: Tuohy  Needle Placement: Paramedian epidural  Findings:   -Comments: Excellent flow of contrast into the epidural space.  Patient reaction to parts of the injection and movement on the table very exaggerated compared to most patients.  Procedure Details: Using a paramedian approach from the side mentioned above, the region overlying the inferior lamina was localized under fluoroscopic visualization and the soft tissues overlying this structure were infiltrated with 4 ml. of 1% Lidocaine without Epinephrine. The Tuohy needle was inserted into the epidural space using a paramedian approach.   The epidural space was localized using loss of resistance along with lateral and bi-planar fluoroscopic views.  After  negative aspirate for air, blood, and CSF, a 2 ml. volume of Isovue-250 was injected into the epidural space and the flow of contrast was observed. Radiographs were obtained for documentation purposes.    The injectate was administered into the level noted above.   Additional Comments:  No complications occurred Dressing: 2 x 2 sterile gauze and Band-Aid    Post-procedure details: Patient was observed during the procedure. Post-procedure instructions were reviewed.  Patient left the clinic in stable condition.   Clinical History: MRI LUMBAR SPINE WITHOUT CONTRAST  TECHNIQUE: Multiplanar, multisequence MR imaging of the lumbar spine was performed. No intravenous contrast was administered.  COMPARISON:  Lumbar spine radiograph 01/29/2017  FINDINGS: Segmentation:  Standard.  Alignment:  Physiologic.  Vertebrae:  No fracture, evidence of discitis, or bone lesion.  Conus medullaris: Extends to the L1-2 level and appears normal.  Paraspinal and other soft tissues: Normal  Disc levels:  T11-T12: Small disc bulge. No spinal canal or neural foraminal stenosis.  T12-L1: Normal disc space and facets. No spinal canal or neuroforaminal stenosis.  L1-L2: Normal disc space and facets. No spinal canal or neuroforaminal stenosis.  L2-L3: Left foraminal disc protrusion with moderate left foraminal stenosis. No spinal canal stenosis or right neural foraminal stenosis.  L3-L4: Intermediate disc bulge with mild spinal canal stenosis. Moderate bilateral facet hypertrophy. Mild bilateral neural foraminal stenosis.  L4-L5: Normal disc space and facets. No spinal canal or neuroforaminal stenosis.  L5-S1: Normal disc space and facets. No spinal canal or neuroforaminal stenosis.  Visualized sacrum: Normal.  IMPRESSION: 1. 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 2. Mild bilateral L3-4 neural foraminal stenosis,  multifactorial. 3. No lumbar spinal canal stenosis.   Electronically Signed   By: Ulyses Jarred M.D.   On: 01/30/2017 02:02   She reports that she has been smoking cigarettes. She has a 9.00 pack-year smoking history. She has never used smokeless tobacco.  Recent Labs    01/02/19 1600  HGBA1C 6.4    Objective:  VS:  HT:     WT:    BMI:      BP:124/90   HR:68bpm   TEMP: ( )   RESP:  Physical Exam Vitals signs and nursing note reviewed.  Constitutional:      General: She is not in acute distress.    Appearance: Normal appearance. She is well-developed. She is obese.  HENT:     Head: Normocephalic and atraumatic.     Nose: Nose normal.     Mouth/Throat:     Mouth: Mucous membranes are moist.  Pharynx: Oropharynx is clear.  Eyes:     Conjunctiva/sclera: Conjunctivae normal.     Pupils: Pupils are equal, round, and reactive to light.  Neck:     Musculoskeletal: Neck supple. No neck rigidity.  Cardiovascular:     Rate and Rhythm: Regular rhythm.  Pulmonary:     Effort: Pulmonary effort is normal. No respiratory distress.  Abdominal:     General: There is no distension.     Palpations: Abdomen is soft.     Tenderness: There is no guarding.  Musculoskeletal:     Right lower leg: No edema.     Left lower leg: No edema.     Comments: Cervical range of motion is only limited at end ranges.  She has negative Spurling's test.  She does have pain and impingement of the right shoulder with rotation externally.  She has good strength in the upper extremities.  Examination of the lower spine shows that she walks with a forward flexed lumbar spine she has pain with extension and facet loading.  She has generalized pain across the lower back and greater trochanters.  She has good distal strength.  Lymphadenopathy:     Cervical: No cervical adenopathy.  Skin:    General: Skin is warm and dry.     Findings: No erythema or rash.  Neurological:     General: No focal deficit present.       Mental Status: She is alert and oriented to person, place, and time.     Motor: No abnormal muscle tone.     Coordination: Coordination normal.     Gait: Gait normal.  Psychiatric:        Mood and Affect: Mood normal.        Behavior: Behavior normal.        Thought Content: Thought content normal.     Ortho Exam Imaging: Xr C-arm No Report  Result Date: 01/12/2019 Please see Notes tab for imaging impression.   Past Medical/Family/Surgical/Social History: Medications & Allergies reviewed per EMR, new medications updated. 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   Claudication ([place]) 09/03/2016   Soreness breast 08/10/2016   TIA (transient ischemic attack) 08/10/2016   History of cervical dysplasia 01/18/2016   Prediabetes 04/17/2015   Healthcare maintenance 04/17/2015   Tobacco use disorder 04/17/2015   Cervicalgia 11/16/2014   Fibromyalgia 06/18/2014   Preventative health care 01/21/2014   Goiter 02/28/2013   FIBROIDS, UTERUS 03/09/2010   GERD 12/26/2006   Past Medical History:  Diagnosis Date   Anxiety    Bilateral carpal tunnel syndrome 03/03/2018   Bronchitis    Diverticulitis    Fibromyalgia    GERD (gastroesophageal reflux disease)    Headache    Substance abuse ([place])    10 years ago ( cocaine)   Tobacco abuse 01/18/2012   Family History  Problem Relation Age of Onset   Cancer Father        lung   Hypertension  Mother    Arthritis Mother  Other Neg Hx        pituitary problem   Past Surgical History:  Procedure Laterality Date   ANKLE SURGERY     BACK SURGERY     Bowel obstruction     CARPAL TUNNEL RELEASE     CERVICAL CONIZATION W/BX N/A 12/27/2015   Procedure: CONIZATION CERVIX WITH BIOPSY;  Surgeon: Terrance Mass, MD;  Location: [facility];  Service: Gynecology;  Laterality: N/A;   DILATATION & CURETTAGE/HYSTEROSCOPY WITH MYOSURE N/A 12/27/2015   Procedure: DILATATION & CURETTAGE/HYSTEROSCOPY WITH MYOSURE;  Surgeon: Terrance Mass, MD;  Location: [facility];  Service: Gynecology;  Laterality: N/A;   ECTOPIC PREGNANCY SURGERY     EYE SURGERY     removed right eye;    EYE SURGERY     Social History   Occupational History   Not on file  Tobacco Use   Smoking status: Current Every Day Smoker    Packs/day: 0.50    Years: 18.00    Pack years: 9.00    Types: Cigarettes   Smokeless tobacco: Never Used   Tobacco comment: 1-800 number given   Substance and Sexual Activity   Alcohol use: Yes    Alcohol/week: 0.0 standard drinks    Comment: HOLIDAYS   Drug use: No    Comment: 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

## 2019-01-20 ENCOUNTER — Telehealth: Payer: Self-pay | Admitting: Orthopaedic Surgery

## 2019-01-20 NOTE — Telephone Encounter (Signed)
Called and advised patient

## 2019-01-20 NOTE — Telephone Encounter (Signed)
Needs return to office before any more meds

## 2019-01-20 NOTE — Telephone Encounter (Signed)
Please see below.

## 2019-01-20 NOTE — Telephone Encounter (Signed)
Patient left a voicemail requesting prescription refill of Oxycodone.

## 2019-01-22 ENCOUNTER — Ambulatory Visit: Payer: Medicare Other | Admitting: Orthopaedic Surgery

## 2019-01-28 ENCOUNTER — Ambulatory Visit: Payer: Medicare Other | Admitting: Orthopaedic Surgery

## 2019-01-29 ENCOUNTER — Ambulatory Visit (INDEPENDENT_AMBULATORY_CARE_PROVIDER_SITE_OTHER): Payer: Medicare Other

## 2019-01-29 ENCOUNTER — Ambulatory Visit (INDEPENDENT_AMBULATORY_CARE_PROVIDER_SITE_OTHER): Payer: Medicare Other | Admitting: Orthopaedic Surgery

## 2019-01-29 ENCOUNTER — Encounter: Payer: Self-pay | Admitting: Orthopaedic Surgery

## 2019-01-29 ENCOUNTER — Other Ambulatory Visit: Payer: Self-pay

## 2019-01-29 VITALS — BP 109/70 | HR 81 | Ht 65.0 in | Wt 215.0 lb

## 2019-01-29 DIAGNOSIS — M25511 Pain in right shoulder: Secondary | ICD-10-CM | POA: Diagnosis not present

## 2019-01-29 DIAGNOSIS — M722 Plantar fascial fibromatosis: Secondary | ICD-10-CM

## 2019-01-29 DIAGNOSIS — M79671 Pain in right foot: Secondary | ICD-10-CM | POA: Diagnosis not present

## 2019-01-29 DIAGNOSIS — M7541 Impingement syndrome of right shoulder: Secondary | ICD-10-CM

## 2019-01-29 MED ORDER — BUPIVACAINE HCL 0.25 % IJ SOLN
0.3300 mL | INTRAMUSCULAR | Status: AC | PRN
  Administered 2019-01-29: .33 mL

## 2019-01-29 MED ORDER — LIDOCAINE HCL 1 % IJ SOLN
1.0000 mL | INTRAMUSCULAR | Status: AC | PRN
  Administered 2019-01-29: 17:00:00 1 mL

## 2019-01-29 MED ORDER — METHYLPREDNISOLONE ACETATE 40 MG/ML IJ SUSP
40.0000 mg | INTRAMUSCULAR | Status: AC | PRN
  Administered 2019-01-29: 40 mg

## 2019-01-29 MED ORDER — OXYCODONE HCL 5 MG PO CAPS: 5.0000 mg | ORAL_CAPSULE | Freq: Four times a day (QID) | ORAL | 0 refills | Status: DC | PRN

## 2019-01-29 NOTE — Progress Notes (Signed)
Office Visit Note   Patient: Alyssa Montgomery           Date of Birth: [birth year]           MRN: [id number] Visit Date: 01/29/2019              Requested by: Wilber Oliphant, MD [address],   [id number] PCP: Wilber Oliphant, MD   Assessment & Plan: Visit Diagnoses:  1. Right shoulder pain, unspecified chronicity   2. Pain in right foot   3. Impingement syndrome of right shoulder   4. Plantar fasciitis of right foot     Plan:  #1: Corticosteroid injection to the right plantar calcaneal area at the spur.  She had good relief after injection. #2: She will need to make an appointment to follow back up for injection to the right shoulder at her leisure. #3: Given a prescription for oxycodone which this should last her 1 month.  Follow-Up Instructions: Return if symptoms worsen or fail to improve.    Face-to-face time spent with patient was greater than 30 minutes.  Greater than 50% of the time was spent in counseling and coordination of care. Orders:  Orders Placed This Encounter  Procedures  . XR Shoulder Right  . XR Foot Complete Right   Meds ordered this encounter  Medications  . oxycodone (OXY-IR) 5 MG capsule    Sig: Take 1 capsule (5 mg total) by mouth every 6 (six) hours as needed.    Dispense:  30 capsule    Refill:  0    Order Specific Question:   Supervising Provider    Answer:   Garald Balding [[id number]]      Procedures: Foot Inj  Date/Time: 01/29/2019 5:22 PM Performed by: Cherylann Ratel, PA-C Authorized by: Cherylann Ratel, PA-C   Consent Given by:  Patient Site marked: the procedure site was marked   Timeout: prior to procedure the correct patient, procedure, and site was verified   Indications:  Fasciitis Condition: Plantar Fasciitis   Location: right plantar fascia muscle   Prep: patient was prepped and draped in usual sterile fashion   Needle Size:  25 G Medications:  40 mg methylPREDNISolone acetate 40 MG/ML; 1 mL lidocaine  1 %; 0.33 mL bupivacaine 0.25 % Patient Tolerance:  Patient tolerated the procedure well with no immediate complications     Clinical Data: No additional findings.   Subjective: Chief Complaint  Patient presents with  . Right Foot - Pain  . Lower Back - Follow-up   Alyssa Montgomery is a 59 year old female who presents with low back pain. She had an injection with Dr. Ernestina Patches. It did help some; however, she is experiencing pain again. She is having cramps.  She is also having right foot pain. She has been diagnosed with plantar fasciatus.  She takes Tylenol which does not help. She is requesting pain medication.  HPI  Alyssa Montgomery comes in today apparently did have an appointment previously but showed up today to have evaluation.  As noted above she is seen Dr. Ernestina Patches and also multiple problems.  However when I asked her which was her worst problem she complained about her right shoulder.  She states that it bothers her at nighttime as well as with activities.  She is losing range of motion.  Pain worsens with trying to put her arm through the arc.  She denies any history of injury or trauma.    Review  of Systems  Constitutional: Positive for fatigue.  HENT: Negative for trouble swallowing.   Eyes: Positive for pain.  Respiratory: Positive for shortness of breath.   Cardiovascular: Negative for leg swelling.  Gastrointestinal: Positive for constipation.  Endocrine: Negative for cold intolerance.  Genitourinary: Negative for difficulty urinating.  Musculoskeletal: Positive for back pain and gait problem.  Skin: Negative for rash.  Allergic/Immunologic: Negative for food allergies.  Neurological: Positive for weakness and numbness.  Hematological: Does not bruise/bleed easily.  Psychiatric/Behavioral: Positive for sleep disturbance.     Objective: Vital Signs: BP 109/70 (BP Location: Right Arm, Patient Position: Sitting, Cuff Size: Normal)   Pulse 81   Ht 5\' 5"  (1.651 m)   Wt 215 lb  (97.5 kg)   BMI 35.78 kg/m   Physical Exam Constitutional:      Appearance: She is well-developed.  Eyes:     Pupils: Pupils are equal, round, and reactive to light.  Pulmonary:     Effort: Pulmonary effort is normal.  Skin:    General: Skin is warm and dry.  Neurological:     Mental Status: She is alert and oriented to person, place, and time.  Psychiatric:        Behavior: Behavior normal.     Ortho Exam  The right shoulder reveals decreased range of motion.  Forward flexion to about 90 degrees abduction to about 90 degrees.  With the arm 9 degrees of abduction she has around 30 degrees of external rotation 20 degrees of internal rotation.  The right foot reveals marked pain to palpation over the plantar aspect of the calcaneus at the plantar fascial insertion.  No pain with squeeze test.  Fairly good motion with dorsi and palmar plantar flexion.  No real tenderness except little discomfort over the plate on the cyst the distal fibula.   Specialty Comments:  No specialty comments available.  Imaging: Xr Foot Complete Right  Result Date: 01/29/2019 Three-view x-ray of the right foot reveals distal fibular plate noted.  On the lateral she does have a plantar calcaneal spur.  She does have some changes of the calcaneocuboid joint with some narrowing.   Xr Shoulder Right  Result Date: 01/29/2019 She does what appears to have some elevation of the humeral head in relation to the glenoid.  Cystic changes in the greater tuberosity and  superior humeral head. She may have some degenerative tearing degenerative changes calcaneocuboid with some joint space narrowing.    PMFS History: Current Outpatient Medications  Medication Sig Dispense Refill  . acetaminophen (TYLENOL) 325 MG tablet Take 325-975 mg by mouth 2 (two) times daily as needed for mild pain.     Marland Kitchen albuterol (VENTOLIN HFA) 108 (90 Base) MCG/ACT inhaler Inhale 2 puffs into the lungs every 6 (six) hours as needed for  wheezing or shortness of breath. 1 Inhaler 0  . Bismuth Subsalicylate (PEPTO-BISMOL PO) Take by mouth.    Marland Kitchen buPROPion (WELLBUTRIN XL) 150 MG 24 hr tablet TAKE 1 PILL (150 MG) DAILY FOR THE FIRST 3 DAYS, THEN INCREASE TO 2 PILLS (300 MG) DAILY. 180 tablet 1  . Calcium Carbonate-Simethicone (ALKA-SELTZER HEARTBURN + GAS) 750-80 MG CHEW Chew 1 tablet by mouth 2 (two) times daily as needed (heartburn).    . Cholecalciferol (D3-1000) 1000 units tablet Take 1,000 Units by mouth daily. Reported on 12/26/2015    . cyclobenzaprine (FLEXERIL) 5 MG tablet TAKE 1 TABLET BY MOUTH 3 TIMES A DAY AS NEEDED FOR MUSCLE SPASMS 30 tablet 0  .  diclofenac sodium (VOLTAREN) 1 % GEL Apply 2mg  to small joint TID prn. 3 Tube 4  . fluticasone (FLONASE) 50 MCG/ACT nasal spray Place 1 spray into both nostrils daily. 1 spray in each nostril every day 16 g 5  . loratadine (ALLERGY) 10 MG tablet Take 10 mg by mouth daily as needed for allergies.    . Menthol-Methyl Salicylate (MUSCLE RUB) 10-15 % CREA Apply 1 application topically as needed for muscle pain. Reported on 12/26/2015    . naproxen (NAPROSYN) 500 MG tablet Take 1 tablet (500 mg total) by mouth 2 (two) times daily with a meal. 30 tablet 0  . pravastatin (PRAVACHOL) 20 MG tablet Take 1 tablet (20 mg total) by mouth daily. 90 tablet 3  . famotidine (PEPCID) 20 MG tablet Take 1 tablet (20 mg total) by mouth 2 (two) times daily. (Patient not taking: Reported on 01/29/2019) 14 tablet 0  . oxycodone (OXY-IR) 5 MG capsule Take 1-2 capsules (5-10 mg total) by mouth 2 (two) times daily as needed. (Patient not taking: Reported on 01/29/2019) 30 capsule 0  . oxycodone (OXY-IR) 5 MG capsule Take 1 capsule (5 mg total) by mouth every 6 (six) hours as needed. 30 capsule 0  . Pseudoephedrine HCl (SUDAFED PO) Take by mouth.    . traMADol (ULTRAM) 50 MG tablet Take 1 tablet (50 mg total) by mouth at bedtime as needed. (Patient not taking: Reported on 01/29/2019) 30 tablet 0  . VOLTAREN 1 %  GEL APPLY 2 G TOPICALLY DAILY AS NEEDED FOR MUSCLE PAIN (Patient not taking: Reported on 01/29/2019) 1 Tube 2   No current facility-administered medications for this visit.     Patient Active Problem List   Diagnosis Date Noted  . Plantar fasciitis of right foot 01/04/2019  . Bilateral hip pain 09/18/2018  . Chronic bilateral low back pain with bilateral sciatica 09/18/2018  . Spinal stenosis of cervical region 07/29/2018  . Cervical spondylosis without myelopathy 07/29/2018  . Cervicogenic headache 07/29/2018  . Bilateral carpal tunnel syndrome 03/03/2018  . Left carpal tunnel syndrome 01/27/2018  . Complaints of total body pain 12/20/2017  . Hyperlipidemia 11/14/2017  . Anxiety 11/13/2016  . 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 Claudication ([place]) 09/03/2016  . Soreness breast 08/10/2016  . TIA (transient ischemic attack) 08/10/2016  . History of cervical dysplasia 01/18/2016  . Prediabetes 04/17/2015  . Healthcare maintenance 04/17/2015  . Tobacco use disorder 04/17/2015  . Cervicalgia 11/16/2014  . Fibromyalgia 06/18/2014  . Preventative health care 01/21/2014  . Goiter 02/28/2013  . FIBROIDS, UTERUS 03/09/2010  . GERD 12/26/2006   Past Medical History:  Diagnosis Date  . Anxiety   . Bilateral carpal tunnel syndrome 03/03/2018  . Bronchitis   . Diverticulitis   . Fibromyalgia   . GERD (gastroesophageal reflux disease)   . Headache   . Plantar fasciitis   . Substance abuse ([place])    10 years ago ( cocaine)  . Tobacco abuse 01/18/2012    Family History  Problem Relation Age of Onset  . Cancer Father        lung  . Hypertension Mother   . Arthritis Mother   . Other Neg Hx        pituitary problem    Past Surgical History:  Procedure Laterality Date  . ANKLE SURGERY    . BACK SURGERY    . Bowel obstruction    . CARPAL TUNNEL RELEASE    . CERVICAL CONIZATION W/BX N/A 12/27/2015   Procedure:  CONIZATION CERVIX WITH BIOPSY;  Surgeon: Terrance Mass, MD;   Location: [facility];  Service: Gynecology;  Laterality: N/A;  . DILATATION & CURETTAGE/HYSTEROSCOPY WITH MYOSURE N/A 12/27/2015   Procedure: DILATATION & CURETTAGE/HYSTEROSCOPY WITH MYOSURE;  Surgeon: Terrance Mass, MD;  Location: [facility];  Service: Gynecology;  Laterality: N/A;  . ECTOPIC PREGNANCY SURGERY    . EYE SURGERY     removed right eye;   . EYE SURGERY     Social History   Occupational History  . Not on file  Tobacco Use  . Smoking status: Current Every Day Smoker    Packs/day: 0.50    Years: 18.00    Pack years: 9.00    Types: Cigarettes  . Smokeless tobacco: Never Used  . Tobacco comment: 1-800 number given   Substance and Sexual Activity  . Alcohol use: Yes    Alcohol/week: 0.0 standard drinks    Comment: NEW YEARS  . 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 Sexual activity: Yes    Partners: Male    Birth control/protection: Post-menopausal

## 2019-01-30 DIAGNOSIS — Z23 Encounter for immunization: Secondary | ICD-10-CM | POA: Diagnosis not present

## 2019-02-05 ENCOUNTER — Ambulatory Visit: Payer: Medicare Other | Admitting: Orthopaedic Surgery

## 2019-02-10 ENCOUNTER — Other Ambulatory Visit: Payer: Self-pay

## 2019-02-10 ENCOUNTER — Ambulatory Visit (INDEPENDENT_AMBULATORY_CARE_PROVIDER_SITE_OTHER): Payer: Medicare Other | Admitting: Orthopaedic Surgery

## 2019-02-10 ENCOUNTER — Encounter: Payer: Self-pay | Admitting: Orthopaedic Surgery

## 2019-02-10 VITALS — BP 131/72 | HR 72 | Ht 65.0 in | Wt 215.0 lb

## 2019-02-10 DIAGNOSIS — M722 Plantar fascial fibromatosis: Secondary | ICD-10-CM | POA: Diagnosis not present

## 2019-02-10 MED ORDER — BUPIVACAINE HCL 0.5 % IJ SOLN
2.0000 mL | INTRAMUSCULAR | Status: AC | PRN
  Administered 2019-02-10: 2 mL via INTRA_ARTICULAR

## 2019-02-10 MED ORDER — METHYLPREDNISOLONE ACETATE 40 MG/ML IJ SUSP
80.0000 mg | INTRAMUSCULAR | Status: AC | PRN
  Administered 2019-02-10: 80 mg via INTRA_ARTICULAR

## 2019-02-10 MED ORDER — LIDOCAINE HCL 2 % IJ SOLN
2.0000 mL | INTRAMUSCULAR | Status: AC | PRN
  Administered 2019-02-10: 2 mL

## 2019-02-10 NOTE — Addendum Note (Signed)
Addended by: Lendon Collar on: 02/10/2019 04:29 PM   Modules accepted: Orders

## 2019-02-10 NOTE — Progress Notes (Signed)
Office Visit Note   Patient: Alyssa Montgomery           Date of Birth: Feb 14, 1961           MRN: [id number] Visit Date: 02/10/2019              Requested by: Wilber Oliphant, MD [address],  [place] [address] PCP: Wilber Oliphant, MD   Assessment & Plan: Visit Diagnoses:  1. Plantar fasciitis of right foot     Plan: Minimal relief of plantar fascial pain after cortisone injection.  Will obtain MRI scan.  Recently had an exacerbation of subacromial right shoulder pain.  Will inject with cortisone  Follow-Up Instructions: Return After MRI scan right heel.   Orders:  Orders Placed This Encounter  Procedures   Large Joint Inj: R subacromial bursa   No orders of the defined types were placed in this encounter.     Procedures: Large Joint Inj: R subacromial bursa on 02/10/2019 3:15 PM Indications: pain and diagnostic evaluation Details: 25 G 1.5 in needle, anterolateral approach  Arthrogram: No  Medications: 2 mL lidocaine 2 %; 2 mL bupivacaine 0.5 %; 80 mg methylPREDNISolone acetate 40 MG/ML Consent was given by the patient. Immediately prior to procedure a time out was called to verify the correct patient, procedure, equipment, support staff and site/side marked as required. Patient was prepped and draped in the usual sterile fashion.       Clinical Data: No additional findings.   Subjective: Chief Complaint  Patient presents with   Right Foot - Follow-up  Patient presents today for follow up on her right plantar fasciitis. She received a cortisone injection on 01/29/2019. Patient states that it helped for a day, but the pain returned.  Also having right shoulder pain that is relatively acute in onset after significant activity.  Having pain with overhead motion.  No numbness or tingling or neck pain  HPI  Review of Systems  Constitutional: Negative for fatigue.  HENT: Negative for ear pain.   Eyes: Negative for pain.  Respiratory: Positive for  shortness of breath.   Cardiovascular: Negative for leg swelling.  Gastrointestinal: Negative for constipation and diarrhea.  Endocrine: Negative for cold intolerance and heat intolerance.  Genitourinary: Negative for difficulty urinating.  Musculoskeletal: Negative for joint swelling.  Skin: Negative for rash.  Allergic/Immunologic: Negative for food allergies.  Neurological: Negative for weakness.  Hematological: Does not bruise/bleed easily.  Psychiatric/Behavioral: Positive for sleep disturbance.     Objective: Vital Signs: BP 131/72    Pulse 72    Ht 5\' 5"  (1.651 m)    Wt 215 lb (97.5 kg)    BMI 35.78 kg/m   Physical Exam  Ortho Exam right foot with pes planus.  Still having some tenderness along the medial aspect of the heel plantarly.  No masses.  Skin is thick and dry.  Positive impingement right shoulder.  Pain out of proportion to what I would expect.  Lots of pain just to touch in multiple places.  Able to place right arm in full overhead motion but with pain no crepitation.  Biceps intact.  Skin intact  Specialty Comments:  No specialty comments available.  Imaging: No results found.   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   Claudication ([place]) 09/03/2016   Soreness breast 08/10/2016   TIA (transient ischemic attack) 08/10/2016   History of cervical dysplasia 01/18/2016   Prediabetes 04/17/2015   Healthcare maintenance  04/17/2015   Tobacco use disorder 04/17/2015   Cervicalgia 11/16/2014   Pain in right shoulder 11/03/2014   Fibromyalgia 06/18/2014   Preventative health care 01/21/2014   Goiter 02/28/2013   FIBROIDS, UTERUS 03/09/2010   GERD 12/26/2006   Past Medical History:  Diagnosis Date   Anxiety    Bilateral carpal tunnel syndrome 03/03/2018   Bronchitis    Diverticulitis    Fibromyalgia    GERD (gastroesophageal reflux disease)    Headache    Plantar fasciitis    Substance abuse ([place])    10 years ago ( cocaine)   Tobacco abuse 01/18/2012    Family History  Problem Relation Age of Onset   Cancer Father        lung   Hypertension Mother    Arthritis Mother    Other Neg Hx        pituitary problem    Past Surgical History:  Procedure Laterality Date   ANKLE SURGERY     BACK SURGERY     Bowel obstruction     CARPAL TUNNEL RELEASE     CERVICAL CONIZATION W/BX N/A 12/27/2015   Procedure: CONIZATION CERVIX WITH BIOPSY;  Surgeon: Terrance Mass, MD;  Location: [facility];  Service: Gynecology;  Laterality: N/A;   DILATATION & CURETTAGE/HYSTEROSCOPY WITH MYOSURE N/A 12/27/2015   Procedure: DILATATION & CURETTAGE/HYSTEROSCOPY WITH MYOSURE;  Surgeon: Terrance Mass, MD;  Location: [facility];  Service: Gynecology;  Laterality: N/A;   ECTOPIC PREGNANCY SURGERY     EYE SURGERY     removed right eye;    EYE SURGERY     Social History   Occupational History   Not on file  Tobacco Use   Smoking status: Current Every Day Smoker    Packs/day: 0.50    Years: 18.00    Pack years: 9.00    Types: Cigarettes   Smokeless tobacco: Never Used   Tobacco comment: 1-800 number given   Substance and Sexual Activity   Alcohol use: Yes    Alcohol/week: 0.0 standard drinks    Comment: NEW YEARS   Drug use: No    Comment: 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

## 2019-02-11 ENCOUNTER — Telehealth: Payer: Self-pay | Admitting: Orthopaedic Surgery

## 2019-02-11 NOTE — Telephone Encounter (Signed)
Patient called stating she just received a call from [facility] to schedule the MRI for her right ankle.  Patient states she told them it was for her right foot and not her ankle and was told she needed to contact Dr. Rudene Anda office to clear up any confusion before scheduling the appointment.  Patient is requesting a return call.

## 2019-02-11 NOTE — Telephone Encounter (Signed)
Spoke with patient. I explained that the ankle was ordered to look at her heel. The foot MRI does not include her heel. She understands and will call GI back to schedule.

## 2019-02-19 ENCOUNTER — Telehealth: Payer: Self-pay | Admitting: Orthopaedic Surgery

## 2019-02-19 ENCOUNTER — Other Ambulatory Visit: Payer: Self-pay | Admitting: Orthopaedic Surgery

## 2019-02-19 DIAGNOSIS — M5442 Lumbago with sciatica, left side: Secondary | ICD-10-CM

## 2019-02-19 DIAGNOSIS — G8929 Other chronic pain: Secondary | ICD-10-CM

## 2019-02-19 MED ORDER — CYCLOBENZAPRINE HCL 5 MG PO TABS: ORAL_TABLET | ORAL | 0 refills | Status: DC

## 2019-02-19 MED ORDER — OXYCODONE-ACETAMINOPHEN 5-325 MG PO TABS: 1.0000 | ORAL_TABLET | Freq: Two times a day (BID) | ORAL | 0 refills | Status: DC | PRN

## 2019-02-19 NOTE — Telephone Encounter (Signed)
Please advise 

## 2019-02-19 NOTE — Telephone Encounter (Signed)
Patient called stating her left hip and leg are extremely painful.  Patient states the pain is in her butt and goes down her leg.  Patient states the pain is keeping her up all night and she is not able to walk, stand, or sit without "excruciating" pain.  Patient states she is scheduled for an MRI of her ankle, but is thinking she might need an MRI of her hip first.  Patient is also requesting prescription refill of Oxycodone and Flexeril to be sent to [facility] at [address] [facility].

## 2019-02-19 NOTE — Telephone Encounter (Signed)
Spoke with patient and advised

## 2019-02-19 NOTE — Telephone Encounter (Signed)
Contact Dr Ernestina Patches for another [facility]. I Renewed flexeril and oxycodone

## 2019-03-02 ENCOUNTER — Other Ambulatory Visit: Payer: Self-pay | Admitting: Orthopaedic Surgery

## 2019-03-02 NOTE — Telephone Encounter (Signed)
Ok to renew?  

## 2019-03-03 ENCOUNTER — Ambulatory Visit: Payer: Medicare Other | Admitting: Orthopaedic Surgery

## 2019-03-09 ENCOUNTER — Ambulatory Visit
Admission: RE | Admit: 2019-03-09 | Discharge: 2019-03-09 | Disposition: A | Discharge status: Home / Self Care | Payer: Medicare Other | Source: Ambulatory Visit | Attending: Orthopaedic Surgery | Admitting: Orthopaedic Surgery

## 2019-03-09 DIAGNOSIS — M722 Plantar fascial fibromatosis: Secondary | ICD-10-CM

## 2019-03-09 DIAGNOSIS — S96911A Strain of unspecified muscle and tendon at ankle and foot level, right foot, initial encounter: Secondary | ICD-10-CM | POA: Diagnosis not present

## 2019-03-11 ENCOUNTER — Ambulatory Visit: Payer: Medicare Other | Admitting: Orthopaedic Surgery

## 2019-03-12 ENCOUNTER — Ambulatory Visit (INDEPENDENT_AMBULATORY_CARE_PROVIDER_SITE_OTHER): Payer: Medicare Other | Admitting: Physical Medicine and Rehabilitation

## 2019-03-12 ENCOUNTER — Ambulatory Visit: Payer: Self-pay

## 2019-03-12 ENCOUNTER — Encounter: Payer: Self-pay | Admitting: Physical Medicine and Rehabilitation

## 2019-03-12 VITALS — BP 135/81 | HR 76

## 2019-03-12 DIAGNOSIS — M5416 Radiculopathy, lumbar region: Secondary | ICD-10-CM | POA: Diagnosis not present

## 2019-03-12 MED ORDER — BETAMETHASONE SOD PHOS & ACET 6 (3-3) MG/ML IJ SUSP
12.0000 mg | Freq: Once | INTRAMUSCULAR | Status: AC
  Administered 2019-03-12: 12 mg

## 2019-03-12 NOTE — Progress Notes (Signed)
 .  Numeric Pain Rating Scale and Functional Assessment Average Pain 8   In the last MONTH (on 0-10 scale) has pain interfered with the following?  1. General activity like being  able to carry out your everyday physical activities such as walking, climbing stairs, carrying groceries, or moving a chair?  Rating(8)   +Driver, -BT, -Dye Allergies.  

## 2019-03-16 ENCOUNTER — Other Ambulatory Visit: Payer: Self-pay | Admitting: Orthopaedic Surgery

## 2019-03-17 ENCOUNTER — Other Ambulatory Visit: Payer: Self-pay

## 2019-03-17 ENCOUNTER — Encounter: Payer: Self-pay | Admitting: Orthopaedic Surgery

## 2019-03-17 ENCOUNTER — Ambulatory Visit (INDEPENDENT_AMBULATORY_CARE_PROVIDER_SITE_OTHER): Payer: Medicare Other | Admitting: Orthopaedic Surgery

## 2019-03-17 ENCOUNTER — Ambulatory Visit (INDEPENDENT_AMBULATORY_CARE_PROVIDER_SITE_OTHER): Payer: Medicare Other

## 2019-03-17 VITALS — BP 126/72 | HR 73 | Ht 65.0 in | Wt 215.0 lb

## 2019-03-17 DIAGNOSIS — M7541 Impingement syndrome of right shoulder: Secondary | ICD-10-CM

## 2019-03-17 DIAGNOSIS — M25511 Pain in right shoulder: Secondary | ICD-10-CM | POA: Diagnosis not present

## 2019-03-17 DIAGNOSIS — M25571 Pain in right ankle and joints of right foot: Secondary | ICD-10-CM | POA: Diagnosis not present

## 2019-03-17 DIAGNOSIS — J209 Acute bronchitis, unspecified: Secondary | ICD-10-CM

## 2019-03-17 HISTORY — DX: Pain in right ankle and joints of right foot: M25.571

## 2019-03-17 MED ORDER — METHYLPREDNISOLONE ACETATE 40 MG/ML IJ SUSP
80.0000 mg | INTRAMUSCULAR | Status: AC | PRN
  Administered 2019-03-17: 80 mg via INTRA_ARTICULAR

## 2019-03-17 MED ORDER — BUPIVACAINE HCL 0.5 % IJ SOLN
2.0000 mL | INTRAMUSCULAR | Status: AC | PRN
  Administered 2019-03-17: 2 mL via INTRA_ARTICULAR

## 2019-03-17 MED ORDER — LIDOCAINE HCL 2 % IJ SOLN
2.0000 mL | INTRAMUSCULAR | Status: AC | PRN
  Administered 2019-03-17: 2 mL

## 2019-03-17 MED ORDER — ALBUTEROL SULFATE HFA 108 (90 BASE) MCG/ACT IN AERS: 2.0000 | INHALATION_SPRAY | Freq: Four times a day (QID) | RESPIRATORY_TRACT | 0 refills | Status: DC | PRN

## 2019-03-17 NOTE — Telephone Encounter (Signed)
Ok to renew?  
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
## 2019-04-08 NOTE — Procedures (Signed)
Lumbosacral Transforaminal Epidural Steroid Injection - Sub-Pedicular Approach with Fluoroscopic Guidance  Patient: Alyssa Montgomery      Date of Birth: 1960-10-09 MRN: [id number] PCP: Wilber Oliphant, MD      Visit Date: 03/12/2019   Universal Protocol:    Date/Time: 03/12/2019  Consent Given By: the patient  Position: PRONE  Additional Comments: Vital signs were monitored before and after the procedure. Patient was prepped and draped in the usual sterile fashion. The correct patient, procedure, and site was verified.   Injection Procedure Details:  Procedure Site One Meds Administered:  Meds ordered this encounter  Medications  . betamethasone acetate-betamethasone sodium phosphate (CELESTONE) injection 12 mg    Laterality: Bilateral  Location/Site:  L4-L5  Needle size: 22 G  Needle type: Spinal  Needle Placement: Transforaminal  Findings:    -Comments: Excellent flow of contrast along the nerve and into the epidural space.  Procedure Details: After squaring off the end-plates to get a true AP view, the C-arm was positioned so that an oblique view of the foramen as noted above was visualized. The target area is just inferior to the "nose of the scotty dog" or sub pedicular. The soft tissues overlying this structure were infiltrated with 2-3 ml. of 1% Lidocaine without Epinephrine.  The spinal needle was inserted toward the target using a "trajectory" view along the fluoroscope beam.  Under AP and lateral visualization, the needle was advanced so it did not puncture dura and was located close the 6 O'Clock position of the pedical in AP tracterory. Biplanar projections were used to confirm position. Aspiration was confirmed to be negative for CSF and/or blood. A 1-2 ml. volume of Isovue-250 was injected and flow of contrast was noted at each level. Radiographs were obtained for documentation purposes.   After attaining the desired flow of contrast documented above, a 0.5 to  1.0 ml test dose of 0.25% Marcaine was injected into each respective transforaminal space.  The patient was observed for 90 seconds post injection.  After no sensory deficits were reported, and normal lower extremity motor function was noted,   the above injectate was administered so that equal amounts of the injectate were placed at each foramen (level) into the transforaminal epidural space.   Additional Comments:  The patient tolerated the procedure well Dressing: 2 x 2 sterile gauze and Band-Aid    Post-procedure details: Patient was observed during the procedure. Post-procedure instructions were reviewed.  Patient left the clinic in stable condition.

## 2019-04-08 NOTE — Progress Notes (Signed)
Alyssa Montgomery - 58 y.o. female MRN [id number]  Date of birth: 10-02-1960  Office Visit Note: Visit Date: 03/12/2019 PCP: Wilber Oliphant, MD Referred by: Wilber Oliphant, MD  Subjective: Chief Complaint  Patient presents with  . Lower Back - Pain  . Right Shoulder - Pain   HPI:  Alyssa Montgomery is a 58 y.o. female who comes in today For planned bilateral L4 transforaminal epidural steroid injection.  Prior interlaminar injection at this level was very beneficial for a few weeks and the symptoms slowly returned.  Patient unfortunately has chronic pain syndrome with chronic musculoskeletal and orthopedic complaints.  She gets multiple joint injections through Dr. Durward Fortes.  She is having some right shoulder pain complaints today I will ask her to follow-up with him.  She reports that she does have a follow-up soon with him in general.  From a spine standpoint she has pretty minimal findings there is changes at L3-4 with likely lateral recess narrowing at L3-4 Duda facet arthropathy and disc bulging but no severe stenosis.  We will try a transforaminal injection.  If not getting better from that then I think she should continue with chronic pain management who she sees she does take some opioid treatment.  She would do well with weight loss and continued physical therapy regrouping.  ROS Otherwise per HPI.  Assessment & Plan: Visit Diagnoses:  1. Lumbar radiculopathy     Plan: No additional findings.   Meds & Orders:  Meds ordered this encounter  Medications  . betamethasone acetate-betamethasone sodium phosphate (CELESTONE) injection 12 mg    Orders Placed This Encounter  Procedures  . XR C-ARM NO REPORT  . Epidural Steroid injection    Follow-up: Return for With Dr. Joni Fears for right shoulder evaluation.   Procedures: No procedures performed  Lumbosacral Transforaminal Epidural Steroid Injection - Sub-Pedicular Approach with Fluoroscopic Guidance  Patient: Alyssa Montgomery       Date of Birth: [birth year] MRN: [id number] PCP: Wilber Oliphant, MD      Visit Date: 03/12/2019   Universal Protocol:    Date/Time: 03/12/2019  Consent Given By: the patient  Position: PRONE  Additional Comments: Vital signs were monitored before and after the procedure. Patient was prepped and draped in the usual sterile fashion. The correct patient, procedure, and site was verified.   Injection Procedure Details:  Procedure Site One Meds Administered:  Meds ordered this encounter  Medications  . betamethasone acetate-betamethasone sodium phosphate (CELESTONE) injection 12 mg    Laterality: Bilateral  Location/Site:  L4-L5  Needle size: 22 G  Needle type: Spinal  Needle Placement: Transforaminal  Findings:    -Comments: Excellent flow of contrast along the nerve and into the epidural space.  Procedure Details: After squaring off the end-plates to get a true AP view, the C-arm was positioned so that an oblique view of the foramen as noted above was visualized. The target area is just inferior to the "nose of the scotty dog" or sub pedicular. The soft tissues overlying this structure were infiltrated with 2-3 ml. of 1% Lidocaine without Epinephrine.  The spinal needle was inserted toward the target using a "trajectory" view along the fluoroscope beam.  Under AP and lateral visualization, the needle was advanced so it did not puncture dura and was located close the 6 O'Clock position of the pedical in AP tracterory. Biplanar projections were used to confirm position. Aspiration was confirmed to be negative for CSF and/or blood. A 1-2  ml. volume of Isovue-250 was injected and flow of contrast was noted at each level. Radiographs were obtained for documentation purposes.   After attaining the desired flow of contrast documented above, a 0.5 to 1.0 ml test dose of 0.25% Marcaine was injected into each respective transforaminal space.  The patient was observed for 90 seconds  post injection.  After no sensory deficits were reported, and normal lower extremity motor function was noted,   the above injectate was administered so that equal amounts of the injectate were placed at each foramen (level) into the transforaminal epidural space.   Additional Comments:  The patient tolerated the procedure well Dressing: 2 x 2 sterile gauze and Band-Aid    Post-procedure details: Patient was observed during the procedure. Post-procedure instructions were reviewed.  Patient left the clinic in stable condition.     Clinical History: MRI LUMBAR SPINE WITHOUT CONTRAST  TECHNIQUE: Multiplanar, multisequence MR imaging of the lumbar spine was performed. No intravenous contrast was administered.  COMPARISON:  Lumbar spine radiograph 01/29/2017  FINDINGS: Segmentation:  Standard.  Alignment:  Physiologic.  Vertebrae:  No fracture, evidence of discitis, or bone lesion.  Conus medullaris: Extends to the L1-2 level and appears normal.  Paraspinal and other soft tissues: Normal  Disc levels:  T11-T12: Small disc bulge. No spinal canal or neural foraminal stenosis.  T12-L1: Normal disc space and facets. No spinal canal or neuroforaminal stenosis.  L1-L2: Normal disc space and facets. No spinal canal or neuroforaminal stenosis.  L2-L3: Left foraminal disc protrusion with moderate left foraminal stenosis. No spinal canal stenosis or right neural foraminal stenosis.  L3-L4: Intermediate disc bulge with mild spinal canal stenosis. Moderate bilateral facet hypertrophy. Mild bilateral neural foraminal stenosis.  L4-L5: Normal disc space and facets. No spinal canal or neuroforaminal stenosis.  L5-S1: Normal disc space and facets. No spinal canal or neuroforaminal stenosis.  Visualized sacrum: Normal.  IMPRESSION: 1. 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 2. Mild bilateral L3-4 neural foraminal  stenosis, multifactorial. 3. No lumbar spinal canal stenosis.   Electronically Signed   By: Ulyses Jarred M.D.   On: 01/30/2017 02:02     Objective:  VS:  HT:    WT:   BMI:     BP:135/81  HR:76bpm  TEMP: ( )  RESP:  Physical Exam  Ortho Exam Imaging: No results found.

## 2019-04-25 ENCOUNTER — Other Ambulatory Visit: Payer: Self-pay | Admitting: Orthopaedic Surgery

## 2019-04-27 NOTE — Telephone Encounter (Signed)
Please advise 

## 2019-04-27 NOTE — Telephone Encounter (Signed)
Ok to renew?  

## 2019-05-12 ENCOUNTER — Other Ambulatory Visit: Payer: Self-pay

## 2019-05-12 DIAGNOSIS — Z20822 Contact with and (suspected) exposure to covid-19: Secondary | ICD-10-CM

## 2019-05-12 DIAGNOSIS — Z20828 Contact with and (suspected) exposure to other viral communicable diseases: Secondary | ICD-10-CM | POA: Diagnosis not present

## 2019-05-14 LAB — NOVEL CORONAVIRUS, NAA: SARS-CoV-2, NAA: DETECTED — AB

## 2019-05-15 ENCOUNTER — Encounter: Payer: Medicare Other | Admitting: Obstetrics & Gynecology

## 2019-06-08 ENCOUNTER — Telehealth: Payer: Self-pay | Admitting: Orthopaedic Surgery

## 2019-06-08 ENCOUNTER — Other Ambulatory Visit: Payer: Self-pay | Admitting: Orthopaedic Surgery

## 2019-06-08 DIAGNOSIS — G8929 Other chronic pain: Secondary | ICD-10-CM

## 2019-06-08 DIAGNOSIS — M7541 Impingement syndrome of right shoulder: Secondary | ICD-10-CM

## 2019-06-08 DIAGNOSIS — M25511 Pain in right shoulder: Secondary | ICD-10-CM

## 2019-06-08 NOTE — Telephone Encounter (Signed)
MRI has been ordered for her right shoulder based on last office note and patient notified via voicemail.

## 2019-06-08 NOTE — Telephone Encounter (Signed)
Patient called. Her right shoulder is still bothering her and she is requesting the MRI that was discussed be done.   Call back number: [phone]

## 2019-06-08 NOTE — Telephone Encounter (Signed)
Patient called back wanting to know if Dr. Durward Fortes would call her in a RX for Naprosyn called into the [facility] on [address].  CB#[phone].  Thank you.

## 2019-06-08 NOTE — Telephone Encounter (Signed)
Naprosyn 500mg  #30 1 tab po bid prn

## 2019-06-08 NOTE — Telephone Encounter (Signed)
thanks

## 2019-06-08 NOTE — Telephone Encounter (Signed)
Please advise 

## 2019-06-09 ENCOUNTER — Telehealth: Payer: Self-pay

## 2019-06-09 ENCOUNTER — Other Ambulatory Visit: Payer: Self-pay | Admitting: Orthopaedic Surgery

## 2019-06-09 ENCOUNTER — Ambulatory Visit
Admission: RE | Admit: 2019-06-09 | Discharge: 2019-06-09 | Disposition: A | Discharge status: Home / Self Care | Payer: Medicare Other | Source: Ambulatory Visit | Attending: Orthopaedic Surgery | Admitting: Orthopaedic Surgery

## 2019-06-09 ENCOUNTER — Other Ambulatory Visit: Payer: Self-pay

## 2019-06-09 DIAGNOSIS — M25511 Pain in right shoulder: Secondary | ICD-10-CM

## 2019-06-09 DIAGNOSIS — G8929 Other chronic pain: Secondary | ICD-10-CM

## 2019-06-09 DIAGNOSIS — M7541 Impingement syndrome of right shoulder: Secondary | ICD-10-CM

## 2019-06-09 MED ORDER — NAPROXEN 500 MG PO TABS: ORAL_TABLET | ORAL | 0 refills | Status: DC

## 2019-06-09 NOTE — Telephone Encounter (Signed)
Sent in to pharmacy.  

## 2019-06-09 NOTE — Telephone Encounter (Signed)
Patient was returning a call, but she stated that she received a VM about an appointment after having her MRI completed.  I scheduled her for 06/16/2019 if that is okay, if not please let me know.  Thank you.  CB# is [phone].

## 2019-06-16 ENCOUNTER — Encounter: Payer: Self-pay | Admitting: Orthopaedic Surgery

## 2019-06-16 ENCOUNTER — Ambulatory Visit (INDEPENDENT_AMBULATORY_CARE_PROVIDER_SITE_OTHER): Payer: Medicare Other | Admitting: Orthopaedic Surgery

## 2019-06-16 ENCOUNTER — Other Ambulatory Visit: Payer: Self-pay

## 2019-06-16 VITALS — Ht 65.0 in | Wt 215.0 lb

## 2019-06-16 DIAGNOSIS — M25511 Pain in right shoulder: Secondary | ICD-10-CM

## 2019-06-16 DIAGNOSIS — G8929 Other chronic pain: Secondary | ICD-10-CM | POA: Diagnosis not present

## 2019-06-16 MED ORDER — NAPROXEN 500 MG PO TABS: 500.0000 mg | ORAL_TABLET | Freq: Two times a day (BID) | ORAL | 0 refills | Status: DC

## 2019-06-16 NOTE — Progress Notes (Signed)
Office Visit Note   Patient: Alyssa Montgomery           Date of Birth: 1960/09/07           MRN: [id number] Visit Date: 06/16/2019              Requested by: Wilber Oliphant, MD [address],   [id number] PCP: Wilber Oliphant, MD   Assessment & Plan: Visit Diagnoses:  1. Chronic right shoulder pain     Plan: MRI scan demonstrates massive rotator cuff tear of the right shoulder involving the entire supraspinatus, superior half of the infraspinatus and the subscapularis.  There is marked generalized cuff muscle atrophy.  The biceps long head is not identified.  There is mild arthropathy of the [facility] joint.  There is superior migration of the humeral head that abuts the acromion.  There are degenerative changes along the superior lateral humeral head.  Alyssa Montgomery has rotator cuff arthropathy.  Long discussion over 30 minutes regarding her diagnosis and treatment options.  I think definitive treatment would be a reverse shoulder arthroplasty.  I have discussed this with her in detail.  I will consult Dr. Marlou Sa, place her in a sling and prescribed Naprosyn 500 mg p.o. twice daily as needed for her discomfort.  Follow-Up Instructions: Return Consult Dr. Marlou Sa for consideration of reverse shoulder arthroplasty.   Orders:  Orders Placed This Encounter  Procedures  . Ambulatory referral to Orthopedic Surgery   Meds ordered this encounter  Medications  . naproxen (NAPROSYN) 500 MG tablet    Sig: Take 1 tablet (500 mg total) by mouth 2 (two) times daily with a meal.    Dispense:  60 tablet    Refill:  0      Procedures: No procedures performed   Clinical Data: No additional findings.   Subjective: Chief Complaint  Patient presents with  . Right Shoulder - Follow-up    MRI results  Patient presents today for follow up on her right shoulder. She had an MRI on 06/09/2019 and is here today for those results. She said that she cannot sleep at night. She has pain all around her  shoulder. Her pain has worsened since her last visit. Alyssa Montgomery fell over 3 months ago with exacerbation of right shoulder pain.  She has had some difficulty raising her arm over her head ever since that time.  She was experiencing shoulder pain earlier in the year and I obtained an MRI scan revealing a small full-thickness tear of the supraspinatus without any atrophy.  She wished to avoid surgery and went through a course of exercises and cortisone injection.  HPI  Review of Systems   Objective: Vital Signs: Ht 5\' 5"  (1.651 m)   Wt 215 lb (97.5 kg)   BMI 35.78 kg/m   Physical Exam Constitutional:      Appearance: She is well-developed.  Eyes:     Pupils: Pupils are equal, round, and reactive to light.  Pulmonary:     Effort: Pulmonary effort is normal.  Skin:    General: Skin is warm and dry.  Neurological:     Mental Status: She is alert and oriented to person, place, and time.  Psychiatric:        Behavior: Behavior normal.     Ortho Exam awake alert and oriented x3 comfortable sitting.  She does have pain with motion of her right shoulder.  She could abduct about 80 degrees and flex about  the same with considerable anterior and lateral shoulder pain.  She had considerable weakness with external rotation and moderate weakness with internal rotation.  Positive impingement and empty can testing.  No crepitation.  Good grip and release.Good grip Specialty Comments:  No specialty comments available.  Imaging: No results found.   PMFS History: Patient Active Problem List   Diagnosis Date Noted  . Pain in right ankle and joints of right foot 03/17/2019  . Plantar fasciitis of right foot 01/04/2019  . Bilateral hip pain 09/18/2018  . Chronic bilateral low back pain with bilateral sciatica 09/18/2018  . Spinal stenosis of cervical region 07/29/2018  . Cervical spondylosis without myelopathy 07/29/2018  . Cervicogenic headache 07/29/2018  . Bilateral carpal tunnel syndrome  03/03/2018  . Left carpal tunnel syndrome 01/27/2018  . Complaints of total body pain 12/20/2017  . Hyperlipidemia 11/14/2017  . Anxiety 11/13/2016  . 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 Claudication ([place]) 09/03/2016  . Soreness breast 08/10/2016  . TIA (transient ischemic attack) 08/10/2016  . History of cervical dysplasia 01/18/2016  . Prediabetes 04/17/2015  . Healthcare maintenance 04/17/2015  . Tobacco use disorder 04/17/2015  . Cervicalgia 11/16/2014  . Pain in right shoulder 11/03/2014  . Fibromyalgia 06/18/2014  . Preventative health care 01/21/2014  . Goiter 02/28/2013  . FIBROIDS, UTERUS 03/09/2010  . GERD 12/26/2006   Past Medical History:  Diagnosis Date  . Anxiety   . Bilateral carpal tunnel syndrome 03/03/2018  . Bronchitis   . Diverticulitis   . Fibromyalgia   . GERD (gastroesophageal reflux disease)   . Headache   . Plantar fasciitis   . Substance abuse ([place])    10 years ago ( cocaine)  . Tobacco abuse 01/18/2012    Family History  Problem Relation Age of Onset  . Cancer Father        lung  . Hypertension Mother   . Arthritis Mother   . Other Neg Hx        pituitary problem    Past Surgical History:  Procedure Laterality Date  . ANKLE SURGERY    . BACK SURGERY    . Bowel obstruction    . CARPAL TUNNEL RELEASE    . CERVICAL CONIZATION W/BX N/A 12/27/2015   Procedure: CONIZATION CERVIX WITH BIOPSY;  Surgeon: Terrance Mass, MD;  Location: [facility];  Service: Gynecology;  Laterality: N/A;  . DILATATION & CURETTAGE/HYSTEROSCOPY WITH MYOSURE N/A 12/27/2015   Procedure: DILATATION & CURETTAGE/HYSTEROSCOPY WITH MYOSURE;  Surgeon: Terrance Mass, MD;  Location: [facility];  Service: Gynecology;  Laterality: N/A;  . ECTOPIC PREGNANCY SURGERY    . EYE SURGERY     removed right eye;   . EYE SURGERY     Social History   Occupational History  . Not on file  Tobacco Use  . Smoking status: Current Every Day Smoker    Packs/day: 0.50    Years: 18.00     Pack years: 9.00    Types: Cigarettes  . Smokeless tobacco: Never Used  . Tobacco comment: 1-800 number given   Substance and Sexual Activity  . Alcohol use: Yes    Alcohol/week: 0.0 standard drinks    Comment: NEW YEARS  . 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 Sexual activity: Yes    Partners: Male    Birth control/protection: Post-menopausal

## 2019-06-23 ENCOUNTER — Ambulatory Visit (INDEPENDENT_AMBULATORY_CARE_PROVIDER_SITE_OTHER): Payer: Medicare Other | Admitting: Obstetrics & Gynecology

## 2019-06-23 ENCOUNTER — Other Ambulatory Visit: Payer: Self-pay

## 2019-06-23 ENCOUNTER — Encounter: Payer: Self-pay | Admitting: Obstetrics & Gynecology

## 2019-06-23 VITALS — BP 128/84 | Ht 63.5 in | Wt 224.0 lb

## 2019-06-23 DIAGNOSIS — Z6839 Body mass index (BMI) 39.0-39.9, adult: Secondary | ICD-10-CM | POA: Diagnosis not present

## 2019-06-23 DIAGNOSIS — Z780 Asymptomatic menopausal state: Secondary | ICD-10-CM | POA: Diagnosis not present

## 2019-06-23 DIAGNOSIS — Z9889 Other specified postprocedural states: Secondary | ICD-10-CM

## 2019-06-23 DIAGNOSIS — Z01419 Encounter for gynecological examination (general) (routine) without abnormal findings: Secondary | ICD-10-CM | POA: Diagnosis not present

## 2019-06-23 DIAGNOSIS — Z9289 Personal history of other medical treatment: Secondary | ICD-10-CM | POA: Diagnosis not present

## 2019-06-23 DIAGNOSIS — E6609 Other obesity due to excess calories: Secondary | ICD-10-CM

## 2019-06-23 DIAGNOSIS — Z9189 Other specified personal risk factors, not elsewhere classified: Secondary | ICD-10-CM | POA: Diagnosis not present

## 2019-06-23 DIAGNOSIS — Z23 Encounter for immunization: Secondary | ICD-10-CM | POA: Diagnosis not present

## 2019-06-23 NOTE — Progress Notes (Signed)
Alyssa Montgomery 07/02/1960 [id number]   History:    59 y.o. [id number] Married  RP:  Established patient presenting for annual gyn exam   HPI: Menopause, well on no hormone replacement therapy.  No postmenopausal bleeding.  No pelvic pain.  No pain with intercourse, but low libido, rarely sexually active.  No abnormal vaginal discharge. 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  Urine and bowel movements normal.  Breasts normal.  Body mass index 36.44, last year, now increased to 39.06 now.  Not physically active.  Will f/u here for Fasting Health labs.  Quit smoking x 04/27/2019.  Due for [place] this year.  Past medical history,surgical history, family history and social history were all reviewed and documented in the EPIC chart.  Gynecologic History No LMP recorded. Patient is postmenopausal.  Obstetric History OB History  Gravida Para Term Preterm AB Living  _0 SAB TAB Ectopic Multiple Live Births  _1 # Outcome Date GA Lbr Len/2nd Weight Sex Delivery Anes PTL Lv  6 Term 03/24/89   8 lb 2 oz (3.685 kg) M Vag-Spont     5 Term 12/27/79   6 lb 10 oz (3.005 kg) F Vag-Spont     4 SAB           3 TAB           2 SAB           1 Ectopic              ROS: A ROS was performed and pertinent positives and negatives are included in the history.  GENERAL: No fevers or chills. HEENT: No change in vision, no earache, sore throat or sinus congestion. NECK: No pain or stiffness. CARDIOVASCULAR: No chest pain or pressure. No palpitations. PULMONARY: No shortness of breath, cough or wheeze. GASTROINTESTINAL: No abdominal pain, nausea, vomiting or diarrhea, melena or bright red blood per rectum. GENITOURINARY: No urinary frequency, urgency, hesitancy or dysuria. MUSCULOSKELETAL: No joint or muscle pain, no back pain, no recent trauma. DERMATOLOGIC: No rash, no itching, no lesions. ENDOCRINE: No polyuria, polydipsia, no heat or cold intolerance. No recent change in  weight. HEMATOLOGICAL: No anemia or easy bruising or bleeding. NEUROLOGIC: No headache, seizures, numbness, tingling or weakness. PSYCHIATRIC: No depression, no loss of interest in normal activity or change in sleep pattern.     Exam:   BP 128/84   Ht 5' 3.5" (1.613 m)   Wt 224 lb (101.6 kg)   BMI 39.06 kg/m   Body mass index is 39.06 kg/m.  General appearance : Well developed well nourished female. No acute distress HEENT: Eyes: no retinal hemorrhage or exudates,  Neck supple, trachea midline, no carotid bruits, no thyroidmegaly Lungs: Clear to auscultation, no rhonchi or wheezes, or rib retractions  Heart: Regular rate and rhythm, no murmurs or gallops Breast:Examined in sitting and supine position were symmetrical in appearance, no palpable masses or tenderness,  no skin retraction, no nipple inversion, no nipple discharge, no skin discoloration, no axillary or supraclavicular lymphadenopathy Abdomen: no palpable masses or tenderness, no rebound or guarding Extremities: no edema or skin discoloration or tenderness  Pelvic: Vulva: Normal             Vagina: No gross lesions or discharge  Cervix: No gross lesions or discharge.  Pap/HPV HR done.  Uterus  AV, normal size, shape and consistency, non-tender  and mobile  Adnexa  Without masses or tenderness  Anus: Normal   Assessment/Plan:  59 y.o. female for annual exam   1. Encounter for routine gynecological examination with Papanicolaou smear of cervix Normal gynecologic exam in menopause.  Pap test with high-risk HPV done today.  Breast exam normal.  Last screening mammogram July 2019 was negative, will schedule a screening mammogram now.  Due for colonoscopy this year.  We will follow-up here for fasting health labs.  Patient quit smoking, congratulations! - CBC; Future - Comp Met (CMET); Future - Lipid panel; Future - TSH; Future - VITAMIN D 25 Hydroxy (Vit-D Deficiency, Fractures); Future  2. H/O LEEP In 2017, margins  negative.  3. Postmenopausal Well on no hormone replacement therapy.  No postmenopausal bleeding.  Vitamin D supplements, calcium intake of 1200 mg daily and regular weightbearing physical activity is recommended.  4. Class 2 obesity due to excess calories without serious comorbidity with body mass index (BMI) of 39.0 to 39.9 in adult Recommend a lower calorie/carb diet such as [employer].  Intermittent fasting discussed.  Aerobic physical activities 5 times a week and light weightlifting every 2 days recommended.  5. Needs flu shot Flu shot given.  Princess Bruins MD, 4:32 PM 06/23/2019

## 2019-06-24 DIAGNOSIS — Z6839 Body mass index (BMI) 39.0-39.9, adult: Secondary | ICD-10-CM | POA: Diagnosis not present

## 2019-06-24 DIAGNOSIS — Z23 Encounter for immunization: Secondary | ICD-10-CM

## 2019-06-24 DIAGNOSIS — Z01419 Encounter for gynecological examination (general) (routine) without abnormal findings: Secondary | ICD-10-CM | POA: Diagnosis not present

## 2019-06-24 DIAGNOSIS — Z9289 Personal history of other medical treatment: Secondary | ICD-10-CM | POA: Diagnosis not present

## 2019-06-24 DIAGNOSIS — E6609 Other obesity due to excess calories: Secondary | ICD-10-CM | POA: Diagnosis not present

## 2019-06-24 DIAGNOSIS — Z9189 Other specified personal risk factors, not elsewhere classified: Secondary | ICD-10-CM | POA: Diagnosis not present

## 2019-06-24 DIAGNOSIS — R87610 Atypical squamous cells of undetermined significance on cytologic smear of cervix (ASC-US): Secondary | ICD-10-CM | POA: Diagnosis not present

## 2019-06-24 DIAGNOSIS — Z780 Asymptomatic menopausal state: Secondary | ICD-10-CM | POA: Diagnosis not present

## 2019-06-25 ENCOUNTER — Telehealth: Payer: Self-pay | Admitting: *Deleted

## 2019-06-25 ENCOUNTER — Other Ambulatory Visit (INDEPENDENT_AMBULATORY_CARE_PROVIDER_SITE_OTHER): Payer: Self-pay | Admitting: Orthopaedic Surgery

## 2019-06-25 NOTE — Telephone Encounter (Signed)
-----   Message from Princess Bruins, MD sent at 06/23/2019  4:58 PM EST ----- Regarding: Refer to [facility] Due for Screening Colonoscopy

## 2019-06-25 NOTE — Telephone Encounter (Signed)
I called patient to relay she can call [facility] to schedule screening colonoscopy, no referral needed. It appears patient saw Dr.Gessner [phone] for last colonoscopy. I was not able to speak with patient, a man answered the phone and said she is at the dentist office.

## 2019-06-26 ENCOUNTER — Encounter: Payer: Self-pay | Admitting: Obstetrics & Gynecology

## 2019-06-26 LAB — PAP IG W/ RFLX HPV ASCU

## 2019-06-26 LAB — HUMAN PAPILLOMAVIRUS, HIGH RISK: HPV DNA High Risk: NOT DETECTED

## 2019-06-26 NOTE — Telephone Encounter (Signed)
thanks

## 2019-06-26 NOTE — Patient Instructions (Signed)
1. Encounter for routine gynecological examination with Papanicolaou smear of cervix Normal gynecologic exam in menopause.  Pap test with high-risk HPV done today.  Breast exam normal.  Last screening mammogram July 2019 was negative, will schedule a screening mammogram now.  Due for colonoscopy this year.  We will follow-up here for fasting health labs.  Patient quit smoking, congratulations! - CBC; Future - Comp Met (CMET); Future - Lipid panel; Future - TSH; Future - VITAMIN D 25 Hydroxy (Vit-D Deficiency, Fractures); Future  2. H/O LEEP In 2017, margins negative.  3. Postmenopausal Well on no hormone replacement therapy.  No postmenopausal bleeding.  Vitamin D supplements, calcium intake of 1200 mg daily and regular weightbearing physical activity is recommended.  4. Class 2 obesity due to excess calories without serious comorbidity with body mass index (BMI) of 39.0 to 39.9 in adult Recommend a lower calorie/carb diet such as [employer].  Intermittent fasting discussed.  Aerobic physical activities 5 times a week and light weightlifting every 2 days recommended.  5. Needs flu shot Flu shot given.  Alyssa Montgomery, it was a pleasure seeing you today!  I will inform you of your results as soon as they are available.  Congratulations on quitting smoking.

## 2019-07-01 ENCOUNTER — Other Ambulatory Visit: Payer: Self-pay

## 2019-07-01 ENCOUNTER — Ambulatory Visit (INDEPENDENT_AMBULATORY_CARE_PROVIDER_SITE_OTHER): Payer: Medicare Other | Admitting: Orthopedic Surgery

## 2019-07-01 ENCOUNTER — Encounter: Payer: Self-pay | Admitting: Orthopedic Surgery

## 2019-07-01 VITALS — Ht 63.0 in | Wt 224.0 lb

## 2019-07-01 DIAGNOSIS — M75121 Complete rotator cuff tear or rupture of right shoulder, not specified as traumatic: Secondary | ICD-10-CM | POA: Diagnosis not present

## 2019-07-01 MED ORDER — ACETAMINOPHEN-CODEINE #3 300-30 MG PO TABS: 1.0000 | ORAL_TABLET | Freq: Three times a day (TID) | ORAL | 0 refills | Status: DC | PRN

## 2019-07-01 MED ORDER — CYCLOBENZAPRINE HCL 10 MG PO TABS: 10.0000 mg | ORAL_TABLET | Freq: Two times a day (BID) | ORAL | 0 refills | Status: DC | PRN

## 2019-07-02 ENCOUNTER — Encounter: Payer: Self-pay | Admitting: Orthopedic Surgery

## 2019-07-02 NOTE — Progress Notes (Signed)
Office Visit Note   Patient: Alyssa Montgomery           Date of Birth: 06-26-1960           MRN: [id number] Visit Date: 07/01/2019 Requested by: Wilber Oliphant, MD [address],  [place] [address] PCP: Wilber Oliphant, MD  Subjective: Chief Complaint  Patient presents with  . Right Shoulder - Pain    HPI: Alyssa Montgomery is a 59 year old patient with right shoulder pain.  She states that her shoulder pain is getting progressively worse.  Sometimes it radiates down into the arm but not below the elbow.  Is also having some pain radiating in the clavicle region.  Hard for her to sleep.  She has tried rubs and patches.  She also takes care of her mother who is 17 years old.  She states the pain has become relentless.  Tries naproxen and Tylenol without much relief.  She works as a [employer] manager.  No prior surgery on the right shoulder.  She had an MRI scan from January 2020 which is reviewed which showed generally intact rotator cuff.  She has oral surgery pending for March.  Since last January she reports no discrete injury but describes a couple of episodes where she might have "injured the shoulder".  Her pain has been progressive.  New MRI scan shows massive rotator cuff tear with retraction of the entire supraspinatus as well as the superior half of the infraspinatus as well as the superior half of the subscapularis.  There is marked generalized rotator cuff muscle atrophy by report.  On my review the subscapularis muscle belly appears to be relatively well-maintained.  The supraspinatus has significant atrophy in the infraspinatus has atrophy but not as much as the supraspinatus.  She reports both pain as well as loss of function.              ROS: All systems reviewed are negative as they relate to the chief complaint within the history of present illness.  Patient denies  fevers or chills.   Assessment & Plan: Visit Diagnoses: No diagnosis found.  Plan: Impression is massive rotator cuff tear  with loss of function and effusion in the shoulder joint.  No fevers and no prior surgeries.  This is a tough situation for [place].  In examining her case the question is between reverse shoulder replacement versus lower trapezius tendon transfer.  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  She does have some tearing of the subscap but the muscle belly itself is preserved on my review of the imaging studies.  I think that upper border of the subscap could be repaired to give a functional anterior portion of the force couple of the rotator cuff.  I also think that with lower trapezius transfer the posterior element of the force couple could be restored.  She has a mild amount of posterior subluxation statically on the MRI scan axial views indicating some loss of that external rotation function.  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  The risk and benefits are discussed including not limited to infection nerve vessel damage the prolonged period of time that she will be required to wear the brace.  Extensive rehabilitative effort required is also discussed.  I think that this  is been to be a better option for her as compared to reverse shoulder replacement.  I did tell her that is typically surgery that has limited revision potential should it loosen or fail in her lifetime.  I think she may come to that procedure potentially in 5 to 10 years but if we can get that amount of time out of this lower trapezius transfer I think it would be beneficial.  Subscapularis repair will also have to be done.  Patient understands the risk and benefits of surgery.  All questions answered.  Follow-Up Instructions: No follow-ups on file.   Orders:  No orders of the defined types were placed in this encounter.  Meds ordered this encounter  Medications  .  cyclobenzaprine (FLEXERIL) 10 MG tablet    Sig: Take 1 tablet (10 mg total) by mouth 2 (two) times daily as needed for muscle spasms.    Dispense:  40 tablet    Refill:  0  . acetaminophen-codeine (TYLENOL #3) 300-30 MG tablet    Sig: Take 1 tablet by mouth every 8 (eight) hours as needed for moderate pain.    Dispense:  40 tablet    Refill:  0      Procedures: No procedures performed   Clinical Data: No additional findings.  Objective: Vital Signs: Ht 5\' 3"  (1.6 m)   Wt 224 lb (101.6 kg)   BMI 39.68 kg/m   Physical Exam:   Constitutional: Patient appears well-developed HEENT:  Head: Normocephalic Eyes:EOM are normal Neck: Normal range of motion Cardiovascular: Normal rate Pulmonary/chest: Effort normal Neurologic: Patient is alert Skin: Skin is warm Psychiatric: Patient has normal mood and affect    Ortho Exam: Ortho exam demonstrates 3 out of 5 external rotation strength on the right 4+ out of 5 subscap strength on the right.  She has pretty limited forward flexion abduction both about 45 degrees.  No warmth to the right shoulder region compared to the left.  No discrete AC joint tenderness right versus left and no lymphadenopathy in the shoulder girdle region.  Motor or sensory function to the hand is intact.  Deltoid is functional.  Specialty Comments:  No specialty comments available.  Imaging: No results found.   PMFS History: Patient Active Problem List   Diagnosis Date Noted  . Pain in right ankle and joints of right foot 03/17/2019  . Plantar fasciitis of right foot 01/04/2019  . Bilateral hip pain 09/18/2018  . Chronic bilateral low back pain with bilateral sciatica 09/18/2018  . Spinal stenosis of cervical region 07/29/2018  . Cervical spondylosis without myelopathy 07/29/2018  . Cervicogenic headache 07/29/2018  . Bilateral carpal tunnel syndrome 03/03/2018  . Left carpal tunnel syndrome 01/27/2018  . Complaints of total body pain 12/20/2017  .  Hyperlipidemia 11/14/2017  . Anxiety 11/13/2016  . 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 Claudication ([place]) 09/03/2016  . Soreness breast 08/10/2016  . TIA (transient ischemic attack) 08/10/2016  . History of cervical dysplasia 01/18/2016  . Prediabetes 04/17/2015  . Healthcare maintenance 04/17/2015  . Tobacco use disorder 04/17/2015  . Cervicalgia 11/16/2014  . Pain in right shoulder 11/03/2014  . Fibromyalgia 06/18/2014  . Preventative health care 01/21/2014  . Goiter 02/28/2013  . FIBROIDS, UTERUS 03/09/2010  . GERD 12/26/2006   Past Medical History:  Diagnosis Date  . Anxiety   . Bilateral carpal tunnel syndrome 03/03/2018  . Bronchitis   . Diverticulitis   . Fibromyalgia   . GERD (gastroesophageal reflux disease)   .  Headache   . Plantar fasciitis   . Substance abuse ([place])    10 years ago ( cocaine)  . Tobacco abuse 01/18/2012    Family History  Problem Relation Age of Onset  . Cancer Father        lung  . Hypertension Mother   . Arthritis Mother   . Other Neg Hx        pituitary problem    Past Surgical History:  Procedure Laterality Date  . ANKLE SURGERY    . BACK SURGERY    . Bowel obstruction    . CARPAL TUNNEL RELEASE    . CERVICAL CONIZATION W/BX N/A 12/27/2015   Procedure: CONIZATION CERVIX WITH BIOPSY;  Surgeon: Terrance Mass, MD;  Location: [facility];  Service: Gynecology;  Laterality: N/A;  . DILATATION & CURETTAGE/HYSTEROSCOPY WITH MYOSURE N/A 12/27/2015   Procedure: DILATATION & CURETTAGE/HYSTEROSCOPY WITH MYOSURE;  Surgeon: Terrance Mass, MD;  Location: [facility];  Service: Gynecology;  Laterality: N/A;  . ECTOPIC PREGNANCY SURGERY    . EYE SURGERY     removed right eye;   . EYE SURGERY     Social History   Occupational History  . Not on file  Tobacco Use  . Smoking status: Former Smoker    Packs/day: 0.50    Years: 18.00    Pack years: 9.00    Types: Cigarettes    Quit date: 04/27/2019    Years since quitting: 0.1  . Smokeless  tobacco: Never Used  . Tobacco comment: 1-800 number given   Substance and Sexual Activity  . Alcohol use: Yes    Alcohol/week: 0.0 standard drinks    Comment: NEW YEARS  . 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 Sexual activity: Yes    Partners: Male    Birth control/protection: Post-menopausal

## 2019-07-03 ENCOUNTER — Telehealth: Payer: Self-pay | Admitting: Orthopedic Surgery

## 2019-07-03 NOTE — Telephone Encounter (Signed)
Spoke with patient today about scheduling right shoulder surgery per Dr. Randel Pigg request.  I offered patient  28th of Jan at [facility].  Patient would like to hold off for now and schedule something in the month of March.  She states this will give her time to make arrangements for a relative to come and stay with her while recovering. As we get closer to March I will offer her some dates.  Patient has my name and direct number should she change her mind and want to schedulesomething sooner rather than later.

## 2019-07-06 ENCOUNTER — Ambulatory Visit: Payer: Medicare Other | Admitting: Family Medicine

## 2019-07-08 ENCOUNTER — Telehealth: Payer: Self-pay | Admitting: Family Medicine

## 2019-07-08 DIAGNOSIS — Z1211 Encounter for screening for malignant neoplasm of colon: Secondary | ICD-10-CM

## 2019-07-08 NOTE — Telephone Encounter (Signed)
Pt is calling and would like to know Dr. Maudie Mercury could place a referral for her to have colonoscopy at [facility].

## 2019-07-09 NOTE — Telephone Encounter (Signed)
Patient informed with my chart message. 

## 2019-07-13 ENCOUNTER — Other Ambulatory Visit: Payer: Self-pay

## 2019-07-13 ENCOUNTER — Encounter: Payer: Self-pay | Admitting: Family Medicine

## 2019-07-13 ENCOUNTER — Ambulatory Visit (INDEPENDENT_AMBULATORY_CARE_PROVIDER_SITE_OTHER): Payer: Medicare Other | Admitting: Family Medicine

## 2019-07-13 VITALS — BP 110/80 | HR 82 | Wt 229.0 lb

## 2019-07-13 DIAGNOSIS — M75121 Complete rotator cuff tear or rupture of right shoulder, not specified as traumatic: Secondary | ICD-10-CM

## 2019-07-13 DIAGNOSIS — J209 Acute bronchitis, unspecified: Secondary | ICD-10-CM | POA: Diagnosis not present

## 2019-07-13 DIAGNOSIS — H6503 Acute serous otitis media, bilateral: Secondary | ICD-10-CM

## 2019-07-13 DIAGNOSIS — E782 Mixed hyperlipidemia: Secondary | ICD-10-CM

## 2019-07-13 DIAGNOSIS — J3089 Other allergic rhinitis: Secondary | ICD-10-CM

## 2019-07-13 DIAGNOSIS — E119 Type 2 diabetes mellitus without complications: Secondary | ICD-10-CM | POA: Diagnosis not present

## 2019-07-13 DIAGNOSIS — J4520 Mild intermittent asthma, uncomplicated: Secondary | ICD-10-CM | POA: Insufficient documentation

## 2019-07-13 DIAGNOSIS — R7303 Prediabetes: Secondary | ICD-10-CM

## 2019-07-13 HISTORY — DX: Other allergic rhinitis: J30.89

## 2019-07-13 LAB — POCT UA - MICROALBUMIN
Albumin/Creatinine Ratio, Urine, POC: <30
Creatinine, POC: 100 mg/dL
Microalbumin Ur, POC: 30 mg/L

## 2019-07-13 MED ORDER — ALBUTEROL SULFATE HFA 108 (90 BASE) MCG/ACT IN AERS: 2.0000 | INHALATION_SPRAY | Freq: Four times a day (QID) | RESPIRATORY_TRACT | 0 refills | Status: DC | PRN

## 2019-07-13 MED ORDER — FLUTICASONE PROPIONATE 50 MCG/ACT NA SUSP: 1.0000 | Freq: Every day | NASAL | 5 refills | Status: DC

## 2019-07-13 NOTE — Assessment & Plan Note (Signed)
Patient reports daily naproxen use.  Will check BMP today.

## 2019-07-13 NOTE — Assessment & Plan Note (Signed)
The 10-year ASCVD risk score Mikey Bussing DC Brooke Bonito., et al., 2013) is: 6.6%   Values used to calculate the score:     Age: 59 years     Sex: Female     Is Non-Hispanic African American: Yes     Diabetic: Yes     Tobacco smoker: No     Systolic Blood Pressure: A999333 mmHg     Is BP treated: No     HDL Cholesterol: 73 mg/dL     Total Cholesterol: 225 mg/dL  Patient reports history of intolerance to statin medications.  Though, as I have noted before, I am unsure if her muscle cramps truly do come from taking statins.  Patient's ASCVD score has improved greatly now that she has quit smoking. She is also diabetic with a1c of 6.4.  We will obtain a lipid panel today and reassess via shared decision making once we have results.  Overall, I do think it would be best for the patient to be on a statin given her other comorbidities.

## 2019-07-13 NOTE — Patient Instructions (Addendum)
Dear Alyssa Montgomery,   It was good to see you! Thank you for taking your time to come in to be seen. Today, we discussed the following:   High cholesterol  Well check you lipid panel today   We will also check a1c today   Weight gain   Daily exercise!  Meet with Dr. Jenne Campus about diet changes   Follow up in 1 -2 months    Start food diary and exercise daily  You are due for the following Health Maintenance items. Please schedule an appointment to address these prior to leaving.  Health Maintenance Due  Topic Date Due  . OPHTHALMOLOGY EXAM  09/08/1969  . URINE MICROALBUMIN  09/08/1969  . FOOT EXAM  10/19/2018  . HEMOGLOBIN A1C  07/05/2019    Be well,   [name], M.D   [facility] [phone]  *Sign up for MyChart for instant access to your health profile, labs, orders, upcoming appointments or to contact your provider with questions*  ===================================================================================

## 2019-07-13 NOTE — Progress Notes (Signed)
   CHIEF COMPLAINT / HPI:  Weight gain Concerned about weight gain now that she has quit smoking x 3 months. Would like a diet plan.   Cholesterol medication  Has been getting cramps in her legs. Maybe taking 3 pills in a month. Has been on several statins but had poor response.   Diabetes  Patient reports trying to eat better at home but does note that it has been more difficult since quitting smoking and due to [place].   PERTINENT  PMH / PSH: BMI >40, diabetes,   OBJECTIVE: BP 110/80   Pulse 82   Wt 229 lb (103.9 kg)   SpO2 98%   BMI 40.57 kg/m   General: Well-appearing female, no acute distress.  No lower extremity edema  ASSESSMENT / PLAN:  Hyperlipidemia The 10-year ASCVD risk score Mikey Bussing DC Jr., et al., 2013) is: 6.6%   Values used to calculate the score:     Age: 59 years     Sex: Female     Is Non-Hispanic African American: Yes     Diabetic: Yes     Tobacco smoker: No     Systolic Blood Pressure: A999333 mmHg     Is BP treated: No     HDL Cholesterol: 73 mg/dL     Total Cholesterol: 225 mg/dL  Patient reports history of intolerance to statin medications.  Though, as I have noted before, I am unsure if her muscle cramps truly do come from taking statins.  Patient's ASCVD score has improved greatly now that she has quit smoking. She is also diabetic with a1c of 6.4.  We will obtain a lipid panel today and reassess via shared decision making once we have results.  Overall, I do think it would be best for the patient to be on a statin given her other comorbidities.  Diabetes mellitus without complication ([place]) Rechecking patient's A1c today.  She would be a good candidate for Metformin if her A1c is still elevated.  She is also encouraged to start exercise and continue with good eating habits.  I will refer her to Dr. Jenne Campus for a nutrition appointment at patient's request.  We will also obtain urine micro albumin.  Complete tear of right rotator cuff Patient reports daily  naproxen use.  Will check BMP today.    Wilber Oliphant, MD [place]

## 2019-07-13 NOTE — Assessment & Plan Note (Addendum)
Rechecking patient's A1c today.  She would be a good candidate for Metformin if her A1c is still elevated.  She is also encouraged to start exercise and continue with good eating habits.  I will refer her to Dr. Jenne Campus for a nutrition appointment at patient's request.  We will also obtain urine micro albumin.

## 2019-07-14 ENCOUNTER — Telehealth: Payer: Self-pay

## 2019-07-14 LAB — BASIC METABOLIC PANEL
BUN/Creatinine Ratio: 18 (ref 9–23)
BUN: 14 mg/dL (ref 6–24)
CO2: 23 mmol/L (ref 20–29)
Calcium: 9 mg/dL (ref 8.7–10.2)
Chloride: 105 mmol/L (ref 96–106)
Creatinine, Ser: 0.78 mg/dL (ref 0.57–1.00)
GFR calc Af Amer: 96 mL/min/{1.73_m2} (ref >59)
GFR calc non Af Amer: 83 mL/min/{1.73_m2} (ref >59)
Glucose: 140 mg/dL — ABNORMAL HIGH (ref 65–99)
Potassium: 3.8 mmol/L (ref 3.5–5.2)
Sodium: 143 mmol/L (ref 134–144)

## 2019-07-14 LAB — LIPID PANEL
Chol/HDL Ratio: 3.2 ratio (ref 0.0–4.4)
Cholesterol, Total: 216 mg/dL — ABNORMAL HIGH (ref 100–199)
HDL: 68 mg/dL (ref >39)
LDL Chol Calc (NIH): 112 mg/dL — ABNORMAL HIGH (ref 0–99)
Triglycerides: 213 mg/dL — ABNORMAL HIGH (ref 0–149)
VLDL Cholesterol Cal: 36 mg/dL (ref 5–40)

## 2019-07-14 LAB — POCT GLYCOSYLATED HEMOGLOBIN (HGB A1C): HbA1c, POC (controlled diabetic range): 6.5 % (ref 0.0–7.0)

## 2019-07-14 NOTE — Telephone Encounter (Signed)
I called patient and left message that My Chart message returned unread. Please call me to let me read it to her or read it in My Chart and I will see that she did.

## 2019-07-14 NOTE — Telephone Encounter (Signed)
Left message to call me as My Chart message returned unread. See telephone encounter.

## 2019-07-15 ENCOUNTER — Other Ambulatory Visit: Payer: Self-pay | Admitting: Orthopaedic Surgery

## 2019-07-15 ENCOUNTER — Other Ambulatory Visit: Payer: Self-pay | Admitting: Orthopedic Surgery

## 2019-07-15 NOTE — Telephone Encounter (Signed)
Ok to renew?  

## 2019-07-15 NOTE — Telephone Encounter (Signed)
Please advise. Thanks.  

## 2019-07-15 NOTE — Telephone Encounter (Signed)
Please advise 

## 2019-07-16 NOTE — Telephone Encounter (Signed)
Can you please submit?

## 2019-07-16 NOTE — Telephone Encounter (Signed)
Ok for 30 pls call thx

## 2019-08-05 NOTE — Telephone Encounter (Signed)
Patient has been informed on 07/09/19.

## 2019-08-14 ENCOUNTER — Telehealth: Payer: Self-pay | Admitting: Orthopedic Surgery

## 2019-08-14 DIAGNOSIS — G8929 Other chronic pain: Secondary | ICD-10-CM

## 2019-08-14 DIAGNOSIS — M75121 Complete rotator cuff tear or rupture of right shoulder, not specified as traumatic: Secondary | ICD-10-CM

## 2019-08-14 NOTE — Telephone Encounter (Signed)
Please advise. Thanks.  

## 2019-08-14 NOTE — Telephone Encounter (Signed)
Patient called to say she really wants to move forward with her right shoulder surgery because the pain is becoming unbearable, but she needs help at home and is still actively looking to find someone come in her home for a few weeks after her surgery to help care for her elderly mother (60 years old).  Patient states she does not have the heart to put her mother into a nursing home and for this reason she cannot have surgery now.    She is calling to request a referral to pain management and a Rx for Tylenol 3 to be called in at [facility].   Patient is aware Dr. Marlou Sa is out of office until Monday afternoon.

## 2019-08-15 ENCOUNTER — Other Ambulatory Visit: Payer: Self-pay | Admitting: Surgical

## 2019-08-15 MED ORDER — ACETAMINOPHEN-CODEINE #3 300-30 MG PO TABS: 1.0000 | ORAL_TABLET | Freq: Two times a day (BID) | ORAL | 0 refills | Status: DC | PRN

## 2019-08-17 NOTE — Telephone Encounter (Signed)
Referral entered Tried calling patient to discuss. No answer. LMVM advising

## 2019-08-18 ENCOUNTER — Encounter: Payer: Medicare Other | Admitting: Family Medicine

## 2019-08-18 ENCOUNTER — Other Ambulatory Visit: Payer: Self-pay

## 2019-08-18 NOTE — Progress Notes (Signed)
This encounter was created in error - please disregard.

## 2019-08-21 ENCOUNTER — Ambulatory Visit: Payer: Medicare Other | Attending: Internal Medicine

## 2019-08-21 DIAGNOSIS — Z23 Encounter for immunization: Secondary | ICD-10-CM

## 2019-08-21 NOTE — Progress Notes (Signed)
   Covid-19 Vaccination Clinic  Name:  Alyssa Montgomery    MRN: [id number] DOB: 07-Nov-1960  08/21/2019  Ms. Pezzano was observed post Covid-19 immunization for 30 minutes based on pre-vaccination screening without incident. She was provided with Vaccine Information Sheet and instruction to access the V-Safe system.   Ms. Alviar was instructed to call 911 with any severe reactions post vaccine: [place] Kitchen Difficulty breathing  . Swelling of face and throat  . A fast heartbeat  . A bad rash all over body  . Dizziness and weakness   Immunizations Administered    Name Date Dose VIS Date Route   Pfizer COVID-19 Vaccine 08/21/2019 12:19 PM 0.3 mL 05/22/2019 Intramuscular   Manufacturer: [place]   Lot: [id number]   [place]: [id number]

## 2019-09-14 ENCOUNTER — Ambulatory Visit: Payer: Medicare Other | Attending: Internal Medicine

## 2019-09-14 DIAGNOSIS — Z23 Encounter for immunization: Secondary | ICD-10-CM

## 2019-09-14 NOTE — Progress Notes (Signed)
   Covid-19 Vaccination Clinic  Name:  Alyssa Montgomery    MRN: [id number] DOB: July 31, 1960  09/14/2019  [name] was observed post Covid-19 immunization for 15 minutes without incident. She was provided with Vaccine Information Sheet and instruction to access the V-Safe system.   [name] was instructed to call 911 with any severe reactions post vaccine: [place] Kitchen Difficulty breathing  . Swelling of face and throat  . A fast heartbeat  . A bad rash all over body  . Dizziness and weakness   Immunizations Administered    Name Date Dose VIS Date Route   Pfizer COVID-19 Vaccine 09/14/2019 12:22 PM 0.3 mL 05/22/2019 Intramuscular   Manufacturer: [place]   Lot: [id number]   [place]: [id number]

## 2019-09-16 ENCOUNTER — Ambulatory Visit: Payer: Self-pay

## 2019-11-16 ENCOUNTER — Telehealth: Payer: Self-pay

## 2019-11-16 ENCOUNTER — Other Ambulatory Visit: Payer: Self-pay | Admitting: Surgical

## 2019-11-16 MED ORDER — CYCLOBENZAPRINE HCL 10 MG PO TABS: 10.0000 mg | ORAL_TABLET | Freq: Three times a day (TID) | ORAL | 0 refills | Status: DC | PRN

## 2019-11-16 NOTE — Telephone Encounter (Signed)
submittd

## 2019-11-16 NOTE — Telephone Encounter (Signed)
Patient request refill on flexeril 10mg  sent to [facility]

## 2019-11-20 ENCOUNTER — Other Ambulatory Visit: Payer: Self-pay

## 2019-11-20 ENCOUNTER — Encounter: Payer: Self-pay | Admitting: Family Medicine

## 2019-11-20 ENCOUNTER — Ambulatory Visit (INDEPENDENT_AMBULATORY_CARE_PROVIDER_SITE_OTHER): Payer: Medicare Other | Admitting: Family Medicine

## 2019-11-20 VITALS — BP 128/62 | HR 98 | Ht 63.0 in | Wt 232.0 lb

## 2019-11-20 DIAGNOSIS — E119 Type 2 diabetes mellitus without complications: Secondary | ICD-10-CM | POA: Diagnosis not present

## 2019-11-20 DIAGNOSIS — J3089 Other allergic rhinitis: Secondary | ICD-10-CM | POA: Diagnosis not present

## 2019-11-20 DIAGNOSIS — M7062 Trochanteric bursitis, left hip: Secondary | ICD-10-CM

## 2019-11-20 DIAGNOSIS — M25552 Pain in left hip: Secondary | ICD-10-CM | POA: Diagnosis not present

## 2019-11-20 DIAGNOSIS — R937 Abnormal findings on diagnostic imaging of other parts of musculoskeletal system: Secondary | ICD-10-CM | POA: Diagnosis not present

## 2019-11-20 LAB — POCT GLYCOSYLATED HEMOGLOBIN (HGB A1C): HbA1c, POC (controlled diabetic range): 6.8 % (ref 0.0–7.0)

## 2019-11-20 MED ORDER — PRAVASTATIN SODIUM 20 MG PO TABS: 20.0000 mg | ORAL_TABLET | Freq: Every day | ORAL | 3 refills | Status: DC

## 2019-11-20 MED ORDER — ALBUTEROL SULFATE HFA 108 (90 BASE) MCG/ACT IN AERS: 2.0000 | INHALATION_SPRAY | Freq: Four times a day (QID) | RESPIRATORY_TRACT | 0 refills | Status: DC | PRN

## 2019-11-20 MED ORDER — OXYCODONE HCL 5 MG PO TABS: 5.0000 mg | ORAL_TABLET | ORAL | 0 refills | Status: DC | PRN

## 2019-11-20 NOTE — Patient Instructions (Addendum)
Hip Pain   Come back next week so that we can inject your hip and talk more about the other issues you are having.   I will call you about your x-ray results and where we go from there.  I have sent you oxycodone to the pharmacy.  Please take only as needed for severe pain.  I will not be refilling this medication without further diagnostic work-up and management plan.  I will call you about your lab results and provide some direction on whether or not you should continue Tylenol and ibuprofen.  In the meantime, please stay within the dosing instructions.  Please make sure that you are eating with the ibuprofen.

## 2019-11-20 NOTE — Progress Notes (Signed)
    SUBJECTIVE:   CHIEF COMPLAINT / HPI:   Hip Pain  Told that she has had OA of her hip in the past. Had an acute injury in November where she fell and reports her hip has been worsening since. The pain used to be more sporadic, but now is constant and keeping her from sleep. Reports pain is on lateral side of hip and also feels pain in her groin. Requesting MRI today. She has been taking aleve,tylenol and flexeril chronically. She is requesting that we check her kidney function today in the setting of NSAIDs. She is also requesting pain medication today.   PERTINENT  PMH / PSH: HLD, Pituitary abnormality, complete tear of R rotator cuff   OBJECTIVE:   BP 128/62   Pulse 98   Ht 5\' 3"  (1.6 m)   Wt 232 lb (105.2 kg)   SpO2 99%   BMI 41.10 kg/m   General: well appearing Left Hip:  . Inspection of skin deferred. Antalgic gait with limited weight bearing on left side. . TTP of left trochanter. Otherwise, no tenderness at bony prominences or major muscle groups.   . Full range of motion with both active and passive hip flexion/extension, internal/external rotation, adduction/abduction of Right hip. Limited ROM in all axes (especiallly with in/ex rotation) with both active and passive due to pain.  . 5 out of 5 strength in large muscle groups of lower extremities bilaterally.   . No sensory deficits bilaterally.    No previous imaging of hip  ASSESSMENT/PLAN:   Trochanteric bursitis of left hip TTP of left trochanter. Unable to inject today. When patient follows up next week for other chronic disease management, will plan to inject.    Left hip pain Likely OA in the setting of patient's BMI, hx of injury and hx of OA. Will obtain radiographs. Patient understanding why MRI is not necessarily image of choice at this time. Physical exam negative for any neurovascular abnormalities.  Pain: Will check CMP today for kidney function and liver function. Patient to continue with OTC  medications. Also provided #10 oxycodone which patient is aware is not intended to be a recurrent prescription and just for acute pain during diagnostic work up.  Follow up with XR results.   Other issues on agenda, but unable to be addressed 2/2 time restriction:  Feeling down (mom was recently diagnosed with ESRD)  Weight Gain  F/u asthma and allergy  Colonoscopy questions   Wilber Oliphant, MD [place]

## 2019-11-21 LAB — COMPREHENSIVE METABOLIC PANEL
ALT: 20 IU/L (ref 0–32)
AST: 18 IU/L (ref 0–40)
Albumin/Globulin Ratio: 1.7 (ref 1.2–2.2)
Albumin: 4.5 g/dL (ref 3.8–4.9)
Alkaline Phosphatase: 135 IU/L — ABNORMAL HIGH (ref 48–121)
BUN/Creatinine Ratio: 18 (ref 12–28)
BUN: 15 mg/dL (ref 8–27)
Bilirubin Total: <0.2 mg/dL (ref 0.0–1.2)
CO2: 24 mmol/L (ref 20–29)
Calcium: 9.5 mg/dL (ref 8.7–10.3)
Chloride: 104 mmol/L (ref 96–106)
Creatinine, Ser: 0.84 mg/dL (ref 0.57–1.00)
GFR calc Af Amer: 87 mL/min/{1.73_m2} (ref >59)
GFR calc non Af Amer: 76 mL/min/{1.73_m2} (ref >59)
Globulin, Total: 2.7 g/dL (ref 1.5–4.5)
Glucose: 135 mg/dL — ABNORMAL HIGH (ref 65–99)
Potassium: 3.7 mmol/L (ref 3.5–5.2)
Sodium: 142 mmol/L (ref 134–144)
Total Protein: 7.2 g/dL (ref 6.0–8.5)

## 2019-11-23 DIAGNOSIS — M7062 Trochanteric bursitis, left hip: Secondary | ICD-10-CM | POA: Insufficient documentation

## 2019-11-23 DIAGNOSIS — M25552 Pain in left hip: Secondary | ICD-10-CM

## 2019-11-23 HISTORY — DX: Trochanteric bursitis, left hip: M70.62

## 2019-11-23 HISTORY — DX: Pain in left hip: M25.552

## 2019-11-23 NOTE — Assessment & Plan Note (Signed)
Likely OA in the setting of patient's BMI, hx of injury and hx of OA. Will obtain radiographs. Patient understanding why MRI is not necessarily image of choice at this time. Physical exam negative for any neurovascular abnormalities.  Pain: Will check CMP today for kidney function and liver function. Patient to continue with OTC medications. Also provided #10 oxycodone which patient is aware is not intended to be a recurrent prescription and just for acute pain during diagnostic work up.  Follow up with XR results.

## 2019-11-23 NOTE — Assessment & Plan Note (Signed)
TTP of left trochanter. Unable to inject today. When patient follows up next week for other chronic disease management, will plan to inject.

## 2019-11-27 ENCOUNTER — Ambulatory Visit (INDEPENDENT_AMBULATORY_CARE_PROVIDER_SITE_OTHER): Payer: Medicare Other | Admitting: Family Medicine

## 2019-11-27 ENCOUNTER — Other Ambulatory Visit: Payer: Self-pay

## 2019-11-27 ENCOUNTER — Encounter: Payer: Self-pay | Admitting: Family Medicine

## 2019-11-27 ENCOUNTER — Ambulatory Visit
Admission: RE | Admit: 2019-11-27 | Discharge: 2019-11-27 | Disposition: A | Discharge status: Home / Self Care | Payer: Medicare Other | Source: Ambulatory Visit | Attending: Family Medicine | Admitting: Family Medicine

## 2019-11-27 VITALS — BP 128/68 | HR 72 | Ht 63.0 in | Wt 231.0 lb

## 2019-11-27 DIAGNOSIS — M545 Low back pain, unspecified: Secondary | ICD-10-CM

## 2019-11-27 DIAGNOSIS — M7062 Trochanteric bursitis, left hip: Secondary | ICD-10-CM | POA: Diagnosis not present

## 2019-11-27 DIAGNOSIS — M549 Dorsalgia, unspecified: Secondary | ICD-10-CM | POA: Diagnosis not present

## 2019-11-27 DIAGNOSIS — M25552 Pain in left hip: Secondary | ICD-10-CM

## 2019-11-27 DIAGNOSIS — R350 Frequency of micturition: Secondary | ICD-10-CM | POA: Diagnosis not present

## 2019-11-27 LAB — POCT UA - MICROSCOPIC ONLY: Epithelial cells, urine per micros: >20

## 2019-11-27 LAB — POCT URINALYSIS DIP (MANUAL ENTRY)
Bilirubin, UA: NEGATIVE
Blood, UA: NEGATIVE
Glucose, UA: NEGATIVE mg/dL
Ketones, POC UA: NEGATIVE mg/dL
Nitrite, UA: NEGATIVE
Protein Ur, POC: NEGATIVE mg/dL
Spec Grav, UA: 1.025 (ref 1.010–1.025)
Urobilinogen, UA: 0.2 E.U./dL
pH, UA: 6 (ref 5.0–8.0)

## 2019-11-27 MED ORDER — METHYLPREDNISOLONE ACETATE 40 MG/ML IJ SUSP: 40.0000 mg | Freq: Once | INTRAMUSCULAR | Status: DC

## 2019-11-27 MED ORDER — METHYLPREDNISOLONE ACETATE 40 MG/ML IJ SUSP
40.0000 mg | Freq: Once | INTRAMUSCULAR | Status: AC
  Administered 2019-11-27: 40 mg via INTRA_ARTICULAR

## 2019-11-27 MED ORDER — NAPROXEN 500 MG PO TABS: 500.0000 mg | ORAL_TABLET | Freq: Two times a day (BID) | ORAL | 0 refills | Status: DC

## 2019-11-27 NOTE — Progress Notes (Signed)
° ° °  SUBJECTIVE:   CHIEF COMPLAINT / HPI:   L trochanteric bursitis  Patient presents today for steroid injection   Urinary Frequency Patient c/o urinary frequency and is requesting UA today   PERTINENT  PMH / PSH: DM II, left hip pain, low back pain  OBJECTIVE:   BP 128/68    Pulse 72    Ht 5\' 3"  (1.6 m)    Wt 231 lb (104.8 kg)    SpO2 99%    BMI 40.92 kg/m   General: Well-appearing, nontoxic female MSK: Tenderness to palpation of greater trochanter on the left side.  Patient also has tenderness to palpation probably from left lower back, gluteal muscles.  Patient with antalgic gait.  Injection Procedure Note PRE-OP DIAGNOSIS: Trochanteric Bursisits, Left  POST-OP DIAGNOSIS: Same  PROCEDURE: joint injection Performing Physician: [name], MD  Supervising Physician (if applicable): n/a        Procedure: The area was prepped in the usual sterile manner. The needle was inserted into the affected area and 40 mg depo-medrol and 3cc 1% lidocaine solution were injected.  There were no complications during this procedure.   Followup: The patient tolerated the procedure well without complications.  Standard post-procedure care is explained and return precautions are given.  ASSESSMENT/PLAN:   Trochanteric bursitis of left hip Injected hip today.  Continue to monitor as needed.  Left hip pain Follow-up on left hip x-ray.  Added on lower lumbar today given diffuse pain of left lower lumbar area and gluteal area.    Wilber Oliphant, MD [place]

## 2019-11-27 NOTE — Patient Instructions (Addendum)
Please go to [facility] imaging to get your hip x-ray and lower back x-ray.  I will call you with results when I receive them.  I have sent the naproxen over to the pharmacy as requested.  I will also call you by the end of the business day with your urine results.  If it is positive, I will send antibiotics to the pharmacy.  We also talked about diabetes and high cholesterol and high triglycerides.  You declined medication and wanted to start lifestyle changes at this time.  We can recheck these numbers in 3 months.  If there is no improvement, I think it would be a good idea to start the medications.  Please call if you have any questions. Hip Exercises Ask your health care provider which exercises are safe for you. Do exercises exactly as told by your health care provider and adjust them as directed. It is normal to feel mild stretching, pulling, tightness, or discomfort as you do these exercises. Stop right away if you feel sudden pain or your pain gets worse. Do not begin these exercises until told by your health care provider. Stretching and range-of-motion exercises These exercises warm up your muscles and joints and improve the movement and flexibility of your hip. These exercises also help to relieve pain, numbness, and tingling. You may be asked to limit your range of motion if you had a hip replacement. Talk to your health care provider about these restrictions. Hamstrings, supine  1. Lie on your back (supine position). 2. Loop a belt or towel over the ball of your left / right foot. The ball of your foot is on the walking surface, right under your toes. 3. Straighten your left / right knee and slowly pull on the belt or towel to raise your leg until you feel a gentle stretch behind your knee (hamstring). ? Do not let your knee bend while you do this. ? Keep your other leg flat on the floor. 4. Hold this position for __________ seconds. 5. Slowly return your leg to the starting  position. Repeat __________ times. Complete this exercise __________ times a day. Hip rotation  1. Lie on your back on a firm surface. 2. With your left / right hand, gently pull your left / right knee toward the shoulder that is on the same side of the body. Stop when your knee is pointing toward the ceiling. 3. Hold your left / right ankle with your other hand. 4. Keeping your knee steady, gently pull your left / right ankle toward your other shoulder until you feel a stretch in your buttocks. ? Keep your hips and shoulders firmly planted while you do this stretch. 5. Hold this position for __________ seconds. Repeat __________ times. Complete this exercise __________ times a day. Seated stretch This exercise is sometimes called hamstrings and adductors stretch. 1. Sit on the floor with your legs stretched wide. Keep your knees straight during this exercise. 2. Keeping your head and back in a straight line, bend at your waist to reach for your left foot (position A). You should feel a stretch in your right inner thigh (adductors). 3. Hold this position for __________ seconds. Then slowly return to the upright position. 4. Keeping your head and back in a straight line, bend at your waist to reach forward (position B). You should feel a stretch behind both of your thighs and knees (hamstrings). 5. Hold this position for __________ seconds. Then slowly return to the upright position. 6.  Keeping your head and back in a straight line, bend at your waist to reach for your right foot (position C). You should feel a stretch in your left inner thigh (adductors). 7. Hold this position for __________ seconds. Then slowly return to the upright position. Repeat __________ times. Complete this exercise __________ times a day. Lunge This exercise stretches the muscles of the hip (hip flexors). 1. Place your left / right knee on the floor and bend your other knee so that is directly over your ankle. You  should be half-kneeling. 2. Keep good posture with your head over your shoulders. 3. Tighten your buttocks to point your tailbone downward. This will prevent your back from arching too much. 4. You should feel a gentle stretch in the front of your left / right thigh and hip. If you do not feel a stretch, slide your other foot forward slightly and then slowly lunge forward with your chest up until your knee once again lines up over your ankle. ? Make sure your tailbone continues to point downward. 5. Hold this position for __________ seconds. 6. Slowly return to the starting position. Repeat __________ times. Complete this exercise __________ times a day. Strengthening exercises These exercises build strength and endurance in your hip. Endurance is the ability to use your muscles for a long time, even after they get tired. Bridge This exercise strengthens the muscles of your hip (hip extensors). 1. Lie on your back on a firm surface with your knees bent and your feet flat on the floor. 2. Tighten your buttocks muscles and lift your bottom off the floor until the trunk of your body and your hips are level with your thighs. ? Do not arch your back. ? You should feel the muscles working in your buttocks and the back of your thighs. If you do not feel these muscles, slide your feet 1-2 inches (2.5-5 cm) farther away from your buttocks. 3. Hold this position for __________ seconds. 4. Slowly lower your hips to the starting position. 5. Let your muscles relax completely between repetitions. Repeat __________ times. Complete this exercise __________ times a day. Straight leg raises, side-lying This exercise strengthens the muscles that move the hip joint away from the center of the body (hip abductors). 1. Lie on your side with your left / right leg in the top position. Lie so your head, shoulder, hip, and knee line up. You may bend your bottom knee slightly to help you balance. 2. Roll your hips  slightly forward, so your hips are stacked directly over each other and your left / right knee is facing forward. 3. Leading with your heel, lift your top leg 4-6 inches (10-15 cm). You should feel the muscles in your top hip lifting. ? Do not let your foot drift forward. ? Do not let your knee roll toward the ceiling. 4. Hold this position for __________ seconds. 5. Slowly return to the starting position. 6. Let your muscles relax completely between repetitions. Repeat __________ times. Complete this exercise __________ times a day. Straight leg raises, side-lying This exercise strengthens the muscles that move the hip joint toward the center of the body (hip adductors). 1. Lie on your side with your left / right leg in the bottom position. Lie so your head, shoulder, hip, and knee line up. You may place your upper foot in front to help you balance. 2. Roll your hips slightly forward, so your hips are stacked directly over each other and your left /  right knee is facing forward. 3. Tense the muscles in your inner thigh and lift your bottom leg 4-6 inches (10-15 cm). 4. Hold this position for __________ seconds. 5. Slowly return to the starting position. 6. Let your muscles relax completely between repetitions. Repeat __________ times. Complete this exercise __________ times a day. Straight leg raises, supine This exercise strengthens the muscles in the front of your thigh (quadriceps). 1. Lie on your back (supine position) with your left / right leg extended and your other knee bent. 2. Tense the muscles in the front of your left / right thigh. You should see your kneecap slide up or see increased dimpling just above your knee. 3. Keep these muscles tight as you raise your leg 4-6 inches (10-15 cm) off the floor. Do not let your knee bend. 4. Hold this position for __________ seconds. 5. Keep these muscles tense as you lower your leg. 6. Relax the muscles slowly and completely between  repetitions. Repeat __________ times. Complete this exercise __________ times a day. Hip abductors, standing This exercise strengthens the muscles that move the leg and hip joint away from the center of the body (hip abductors). 1. Tie one end of a rubber exercise band or tubing to a secure surface, such as a chair, table, or pole. 2. Loop the other end of the band or tubing around your left / right ankle. 3. Keeping your ankle with the band or tubing directly opposite the secured end, step away until there is tension in the tubing or band. Hold on to a chair, table, or pole as needed for balance. 4. Lift your left / right leg out to your side. While you do this: ? Keep your back upright. ? Keep your shoulders over your hips. ? Keep your toes pointing forward. ? Make sure to use your hip muscles to slowly lift your leg. Do not tip your body or forcefully lift your leg. 5. Hold this position for __________ seconds. 6. Slowly return to the starting position. Repeat __________ times. Complete this exercise __________ times a day. Squats This exercise strengthens the muscles in the front of your thigh (quadriceps). 1. Stand in a door frame so your feet and knees are in line with the frame. You may place your hands on the frame for balance. 2. Slowly bend your knees and lower your hips like you are going to sit in a chair. ? Keep your lower legs in a straight-up-and-down position. ? Do not let your hips go lower than your knees. ? Do not bend your knees lower than told by your health care provider. ? If your hip pain increases, do not bend as low. 3. Hold this position for ___________ seconds. 4. Slowly push with your legs to return to standing. Do not use your hands to pull yourself to standing. Repeat __________ times. Complete this exercise __________ times a day. This information is not intended to replace advice given to you by your health care provider. Make sure you discuss any questions  you have with your health care provider. Document Revised: 01/01/2019 Document Reviewed: 04/08/2018 Elsevier Patient Education  [place].

## 2019-11-30 DIAGNOSIS — R350 Frequency of micturition: Secondary | ICD-10-CM | POA: Insufficient documentation

## 2019-11-30 HISTORY — DX: Frequency of micturition: R35.0

## 2019-11-30 NOTE — Assessment & Plan Note (Signed)
Follow-up on left hip x-ray.  Added on lower lumbar today given diffuse pain of left lower lumbar area and gluteal area.

## 2019-11-30 NOTE — Assessment & Plan Note (Addendum)
UA unremarkable for UTI.  Also notable for greater than 20 epithelial cells.  No treatment at this time.

## 2019-11-30 NOTE — Assessment & Plan Note (Signed)
Injected hip today.  Continue to monitor as needed.

## 2019-12-03 ENCOUNTER — Telehealth: Payer: Self-pay

## 2019-12-03 NOTE — Telephone Encounter (Signed)
Patient calls nurse line reporting undesired side effects to Pravastatin. Patient reports since (6/11) taking she has been experiencing "severe" cramps and increased. Patient reports she has been taking daily. Patient reports she had these same side effects long ago while on this and was told to stop. Patient does not want to be on this anymore and is requesting a change. Please advise.

## 2019-12-03 NOTE — Telephone Encounter (Signed)
Called patient and discussed this issue with her. Will d/c this medication at this time given her intolerance. Will discuss with pharmacy about options for therapy and will follow up at next visit.  She has failed pravastatin and atorvastatin.  Patient to follow up at her earliest convenience for other chronic medications and elevated BMI.

## 2019-12-17 ENCOUNTER — Ambulatory Visit (INDEPENDENT_AMBULATORY_CARE_PROVIDER_SITE_OTHER): Payer: Medicare Other | Admitting: Family Medicine

## 2019-12-17 ENCOUNTER — Encounter: Payer: Self-pay | Admitting: Family Medicine

## 2019-12-17 ENCOUNTER — Other Ambulatory Visit: Payer: Self-pay

## 2019-12-17 VITALS — BP 118/78 | HR 76 | Ht 63.0 in | Wt 233.0 lb

## 2019-12-17 DIAGNOSIS — M7062 Trochanteric bursitis, left hip: Secondary | ICD-10-CM | POA: Diagnosis not present

## 2019-12-17 DIAGNOSIS — M25552 Pain in left hip: Secondary | ICD-10-CM

## 2019-12-17 DIAGNOSIS — E119 Type 2 diabetes mellitus without complications: Secondary | ICD-10-CM | POA: Diagnosis present

## 2019-12-17 DIAGNOSIS — I152 Hypertension secondary to endocrine disorders: Secondary | ICD-10-CM

## 2019-12-17 DIAGNOSIS — I10 Essential (primary) hypertension: Secondary | ICD-10-CM

## 2019-12-17 DIAGNOSIS — E1159 Type 2 diabetes mellitus with other circulatory complications: Secondary | ICD-10-CM | POA: Diagnosis not present

## 2019-12-17 DIAGNOSIS — M79672 Pain in left foot: Secondary | ICD-10-CM | POA: Diagnosis not present

## 2019-12-17 MED ORDER — METFORMIN HCL ER 500 MG PO TB24
ORAL_TABLET | ORAL | 0 refills | Status: DC
Start: 2019-12-17 — End: 2020-01-13

## 2019-12-17 NOTE — Patient Instructions (Addendum)
Starting Metformin and Losartan today.  Metformin is for the diabetes. Start with 500 mg once a day for a week, and then increase to 1,000 mg daily. I will send you a new prescription for 1,000 mg a day next month.  Losartan is a kidney protecting medication that is also used for hypertension. Please come back in 3-4 days to recheck your kidney numbers. The lab has been ordered, you'll just have to call to make a lab appointment.   I am also sending you to physical therapy for your hip. They will call you to make an appointment.

## 2019-12-17 NOTE — Progress Notes (Signed)
° °  SUBJECTIVE:  CHIEF COMPLAINT / HPI:   Diabetes  Patient's last A1C returned at 6.8 at last visit. Has never been on medication previously. Denies blurred vision, polyuria, polydipsia. Does report tingling and loss of sensation in distal extremities (primarly feet).   HTN Reports BP at home has been in 938'B and 017'P systolic but is normal on exam today. Believes it might be her cuff, but does report that nurse that cares for patient's mother took BP and was elevated with her device. Denies blurred vision, chest pain, SOB.   Foot pain  Left foot pain that started recently without trauma. Patient reports that she tried to put ice on the area, but it ended up causing a burn. The foot pain has overall improved recently, but she is still bothered by the burn from the ice.   PERTINENT  PMH / PSH: BMI 41, HLD, HTN    OBJECTIVE:  BP 118/78    Pulse 76    Ht 5\' 3"  (1.6 m)    Wt 233 lb (105.7 kg)    SpO2 98%    BMI 41.27 kg/m   General: well appearing female, NAD  Foot: TTP of medical dorsal aspect of left foot. No obvious swelling of erythema. No crepitus.   ASSESSMENT/PLAN:  Left foot pain Unclear etiology, but over all improving. Patient did sustain injury from ice pack on dorsal foot likely due to neuropathy in her distal extremities. Affected site healing well with mild erythema and scabbing. Will continue to monitor feet closely in setting of likely neuropathy.   Diabetes mellitus without complication ([place]) DM foot exam performed today showing monofilament sensory loss in distal lower extremities. Left > Right. Will start metformin today which will also hopefully help with weight loss in setting of severe hip OA. Patient to follow up in 2 weeks for medication titration. Med SE explained to patient and she is understanding. Will also try to get to the pool for exercise.  Patient needs to see ophthalmology for DM check.   Hypertension associated with diabetes ([place]) Has been HTN at home.  Normal reading in office today. Will start patient on losartan. Recheck BMP in 3-4 days. Patient to follow up in one month if BMP wnl.   Left hip pain Continued hip pain in setting of severe OA. Explained to patient that surgery is likely patient's best option at this time. I do not want to rx narcotics for pain at this time. Will offer PT and continue to encourage patient see her orthopedist.     Wilber Oliphant, MD [place]

## 2019-12-20 ENCOUNTER — Encounter: Payer: Self-pay | Admitting: Family Medicine

## 2019-12-20 DIAGNOSIS — E1159 Type 2 diabetes mellitus with other circulatory complications: Secondary | ICD-10-CM | POA: Insufficient documentation

## 2019-12-20 DIAGNOSIS — M79672 Pain in left foot: Secondary | ICD-10-CM

## 2019-12-20 DIAGNOSIS — I152 Hypertension secondary to endocrine disorders: Secondary | ICD-10-CM | POA: Insufficient documentation

## 2019-12-20 HISTORY — DX: Hypertension secondary to endocrine disorders: I15.2

## 2019-12-20 HISTORY — DX: Pain in left foot: M79.672

## 2019-12-20 HISTORY — DX: Type 2 diabetes mellitus with other circulatory complications: E11.59

## 2019-12-20 MED ORDER — LOSARTAN POTASSIUM 25 MG PO TABS: 25.0000 mg | ORAL_TABLET | Freq: Every day | ORAL | 1 refills | Status: DC

## 2019-12-20 NOTE — Assessment & Plan Note (Signed)
Has been HTN at home. Normal reading in office today. Will start patient on losartan. Recheck BMP in 3-4 days. Patient to follow up in one month if BMP wnl.

## 2019-12-20 NOTE — Assessment & Plan Note (Signed)
Continued hip pain in setting of severe OA. Explained to patient that surgery is likely patient's best option at this time. I do not want to rx narcotics for pain at this time. Will offer PT and continue to encourage patient see her orthopedist.

## 2019-12-20 NOTE — Assessment & Plan Note (Addendum)
Unclear etiology, but over all improving. Patient did sustain injury from ice pack on dorsal foot likely due to neuropathy in her distal extremities. Affected site healing well with mild erythema and scabbing. Will continue to monitor feet closely in setting of likely neuropathy.

## 2019-12-20 NOTE — Assessment & Plan Note (Addendum)
DM foot exam performed today showing monofilament sensory loss in distal lower extremities. Left > Right. Will start metformin today which will also hopefully help with weight loss in setting of severe hip OA. Patient to follow up in 2 weeks for medication titration. Med SE explained to patient and she is understanding. Will also try to get to the pool for exercise.  Patient needs to see ophthalmology for DM check.

## 2019-12-24 ENCOUNTER — Telehealth: Payer: Self-pay

## 2019-12-24 NOTE — Telephone Encounter (Signed)
Called patient. No answer. Left message that she needs to get her Naproxen refill from her PCP, who also filled it for her last month. Ask her to call back if she has any other questions.

## 2019-12-30 ENCOUNTER — Ambulatory Visit: Payer: Medicare Other | Admitting: Orthopaedic Surgery

## 2020-01-05 ENCOUNTER — Ambulatory Visit: Payer: Medicare Other

## 2020-01-11 ENCOUNTER — Encounter: Payer: Self-pay | Admitting: Family Medicine

## 2020-01-11 DIAGNOSIS — H40012 Open angle with borderline findings, low risk, left eye: Secondary | ICD-10-CM | POA: Diagnosis not present

## 2020-01-11 DIAGNOSIS — H524 Presbyopia: Secondary | ICD-10-CM | POA: Diagnosis not present

## 2020-01-11 DIAGNOSIS — H2512 Age-related nuclear cataract, left eye: Secondary | ICD-10-CM | POA: Diagnosis not present

## 2020-01-11 DIAGNOSIS — E119 Type 2 diabetes mellitus without complications: Secondary | ICD-10-CM | POA: Diagnosis not present

## 2020-01-11 DIAGNOSIS — H04122 Dry eye syndrome of left lacrimal gland: Secondary | ICD-10-CM | POA: Diagnosis not present

## 2020-01-11 LAB — HM DIABETES EYE EXAM

## 2020-01-12 ENCOUNTER — Other Ambulatory Visit: Payer: Self-pay | Admitting: Family Medicine

## 2020-01-13 ENCOUNTER — Other Ambulatory Visit: Payer: Self-pay | Admitting: Family Medicine

## 2020-01-13 MED ORDER — METFORMIN HCL ER 500 MG PO TB24: 1000.0000 mg | ORAL_TABLET | Freq: Every day | ORAL | 3 refills | Status: DC

## 2020-01-14 ENCOUNTER — Other Ambulatory Visit: Payer: Self-pay

## 2020-01-14 DIAGNOSIS — M545 Low back pain, unspecified: Secondary | ICD-10-CM

## 2020-01-14 MED ORDER — NAPROXEN 500 MG PO TABS: 500.0000 mg | ORAL_TABLET | Freq: Two times a day (BID) | ORAL | 1 refills | Status: DC

## 2020-01-18 ENCOUNTER — Other Ambulatory Visit: Payer: Self-pay

## 2020-01-18 ENCOUNTER — Ambulatory Visit (HOSPITAL_COMMUNITY)
Admission: EM | Admit: 2020-01-18 | Discharge: 2020-01-18 | Disposition: A | Discharge status: Home / Self Care | Payer: Medicare Other | Attending: Family Medicine | Admitting: Family Medicine

## 2020-01-18 DIAGNOSIS — M79662 Pain in left lower leg: Secondary | ICD-10-CM | POA: Diagnosis not present

## 2020-01-18 DIAGNOSIS — R300 Dysuria: Secondary | ICD-10-CM | POA: Diagnosis not present

## 2020-01-26 DIAGNOSIS — R300 Dysuria: Secondary | ICD-10-CM | POA: Diagnosis not present

## 2020-01-26 DIAGNOSIS — M79662 Pain in left lower leg: Secondary | ICD-10-CM | POA: Diagnosis not present

## 2020-01-26 DIAGNOSIS — R110 Nausea: Secondary | ICD-10-CM | POA: Diagnosis not present

## 2020-02-10 ENCOUNTER — Other Ambulatory Visit: Payer: Self-pay

## 2020-02-10 ENCOUNTER — Ambulatory Visit (INDEPENDENT_AMBULATORY_CARE_PROVIDER_SITE_OTHER): Payer: Medicare Other | Admitting: Family Medicine

## 2020-02-10 ENCOUNTER — Ambulatory Visit (INDEPENDENT_AMBULATORY_CARE_PROVIDER_SITE_OTHER): Payer: Medicare Other | Admitting: Orthopaedic Surgery

## 2020-02-10 ENCOUNTER — Encounter: Payer: Self-pay | Admitting: Orthopaedic Surgery

## 2020-02-10 ENCOUNTER — Encounter: Payer: Self-pay | Admitting: Family Medicine

## 2020-02-10 VITALS — BP 144/94 | HR 68 | Ht 63.0 in | Wt 229.2 lb

## 2020-02-10 VITALS — Ht 63.0 in | Wt 222.0 lb

## 2020-02-10 DIAGNOSIS — M25552 Pain in left hip: Secondary | ICD-10-CM | POA: Diagnosis not present

## 2020-02-10 DIAGNOSIS — M160 Bilateral primary osteoarthritis of hip: Secondary | ICD-10-CM | POA: Diagnosis not present

## 2020-02-10 DIAGNOSIS — R399 Unspecified symptoms and signs involving the genitourinary system: Secondary | ICD-10-CM

## 2020-02-10 DIAGNOSIS — G629 Polyneuropathy, unspecified: Secondary | ICD-10-CM

## 2020-02-10 DIAGNOSIS — R6889 Other general symptoms and signs: Secondary | ICD-10-CM | POA: Diagnosis not present

## 2020-02-10 DIAGNOSIS — N3001 Acute cystitis with hematuria: Secondary | ICD-10-CM

## 2020-02-10 DIAGNOSIS — E119 Type 2 diabetes mellitus without complications: Secondary | ICD-10-CM

## 2020-02-10 LAB — POCT URINALYSIS DIP (MANUAL ENTRY)
Bilirubin, UA: NEGATIVE
Glucose, UA: NEGATIVE mg/dL
Ketones, POC UA: NEGATIVE mg/dL
Nitrite, UA: NEGATIVE
Spec Grav, UA: >=1.03 — AB (ref 1.010–1.025)
Urobilinogen, UA: 0.2 E.U./dL
pH, UA: 5.5 (ref 5.0–8.0)

## 2020-02-10 LAB — POCT UA - MICROSCOPIC ONLY

## 2020-02-10 MED ORDER — GABAPENTIN 100 MG PO CAPS
ORAL_CAPSULE | ORAL | 3 refills | Status: DC
Start: 2020-02-10 — End: 2020-11-22

## 2020-02-10 MED ORDER — ACETAMINOPHEN-CODEINE #3 300-30 MG PO TABS: 1.0000 | ORAL_TABLET | Freq: Three times a day (TID) | ORAL | 0 refills | Status: DC | PRN

## 2020-02-10 NOTE — Patient Instructions (Addendum)
It was nice to meet you today,  I am going to prescribe gabapentin for your nerve pain I am also going to get some blood tests which you can come back tomorrow at any time and have them drawn.  For your UTI symptoms, it looks like it may not have resolved, but I would like to get a urine culture before I send in a prescription.  I will call you when I get the results of the urine culture.  Please do not take more than 1000 mg of Tylenol at a time and do not take it more than 4 times a day.  Please do not take the naproxen more than twice a day.  Please do not take any ibuprofen or Advil when you are also taking the naproxen.  You can start by taking the gabapentin 100 mg at night and then increase to 200 on the third night and increase to 300 on the fifth night and then stay at 300 mg nightly.  I would like to see you back in 2 weeks to discuss your neuropathy again.

## 2020-02-10 NOTE — Progress Notes (Signed)
    SUBJECTIVE:   CHIEF COMPLAINT / HPI:   Neuropathy: Patient complaining of 3 weeks of sudden onset nerve pain in her feet.  Feels like they are numb and burning.  She has mild diabetes.  She denies numbness in her groin or urinary incontinence.  She has a chronic back pain that is described as throbbing and aching.  Pain travels down her groin and into her left leg.  She takes 3 extra strength Tylenol in the morning, 3 Tylenol in the afternoon 3 Tylenol at night.  Also takes Flexeril.  UTI: went to [facility] for uti.  Given a medication which she thinks is nitrofurantoin.  She thinks her UTI never fully resolved.  She is not currently having any burning or stinging.  She describes it as a "pulling" sensation in her groin.     PERTINENT  PMH / PSH: Back pain  OBJECTIVE:   BP (!) 144/94   Pulse 68   Ht 5\' 3"  (1.6 m)   Wt 229 lb 3.2 oz (104 kg)   SpO2 98%   BMI 40.60 kg/m   General: Alert, oriented.  Mild pain. CV: Regular rate and rhythm, no murmurs Pulmonary: Lungs are auscultation bilaterally MSK/neuro: No spinal tenderness.  Straight leg test negative.  Gait slowed, but no foot drop or steppage gait.  Decreased sensation in the plantar side of the toes bilaterally.  Patellar reflexes intact bilaterally  ASSESSMENT/PLAN:   UTI (urinary tract infection) Recently treated for UTI with nitrofurantoin.  Not currently having significant symptoms.  Possibility of treatment failure.  Leukocytes, protein, hemoglobin seen on dipstick.  Will get culture and treat as appropriate based on culture results.  Bilateral primary osteoarthritis of hip Patient taking a significant amount of Tylenol.  Discussed with patient using no more than 1000 mg at a time and limiting that to 3 times daily.  Also taking Flexeril.  Patient sees it orthopedist as recently as earlier today.  They have discussed hip replacement.  After reviewing her x-rays from June and her increasing medication demand, I would strongly  encourage hip replacement with this patient at each visit.  Neuropathy Sudden onset a few weeks ago.  Worsening.  Does not appear to be cauda equina syndrome.  Travels down the medial aspect of her feet bilaterally.  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  June 2021 lumbar x-ray shows L4-L5 anterolisthesis.  I believe the anterolisthesis may be the cause of her neuropathy.  We will get MRI of the lumbar spine and refer to neurosurgery.     Benay Pike, MD [place]

## 2020-02-10 NOTE — Progress Notes (Signed)
Office Visit Note   Patient: Alyssa Montgomery           Date of Birth: [birth year]           MRN: [id number] Visit Date: 02/10/2020              Requested by: Wilber Oliphant, MD [address],  [place] [id number] PCP: Wilber Oliphant, MD   Assessment & Plan: Visit Diagnoses:  1. Pain in left hip   2. Diabetes mellitus without complication ([place])   3. Bilateral primary osteoarthritis of hip     Plan: Alyssa Montgomery was recently diagnosed with diabetes and is being followed by her family physician.  She also was experiencing tingling into the toes of both of her feet and some burning along the medial aspect of both of her feet.  This certainly seems like neuropathy as she is very sensitive just to touch the skin.  She had an MRI scan of her right ankle in September 2020 revealing prominent plantar fasciitis with prior ORIF of her distal fibula fracture without acute abnormality.  There was some degenerative change of the lateral tibiotalar joint but she is not symptomatic in that area.  She also has significant arthritis of both of her hips and is having more trouble on the left than the right.  She had a prior cortisone injection by Dr. Ernestina Patches that provided some relief of her pain.  I will order another injection.  She is aware that hip replacement surgery is the definitive treatment.  Her BMI is 39 and she will need to lose some weight.  She also had inquired about different medicines and I am concerned at this point about giving her Naprosyn as she may have an issue with her kidneys and needs to check with her family physician.  She also may be a candidate for gabapentin but will check with her family physician.  I also see from her chart that she has had some issues with substance abuse and I will be careful about giving her pain medicine but will prescribe Tylenol 3 on this 1 occasion  Follow-Up Instructions: Return if symptoms worsen or fail to improve.   Orders:  Orders Placed This  Encounter  Procedures  . Ambulatory referral to Physical Medicine Rehab   No orders of the defined types were placed in this encounter.     Procedures: No procedures performed   Clinical Data: No additional findings.   Subjective: Chief Complaint  Patient presents with  . Left Hip - Pain  . Right Foot - Pain  Patient presents today for left hip pain. She said that it has been hurting for a year. No known injury. She states that her pain is located in her groin and buttock. The pain travels down her leg and into her foot. She has numbness in her foot. She states that the pain is constant. Nothing makes it better or worse. She uses a rub, Naproxen, and Tylenol. She also presents today for right foot pain. She hit her foot on the dog's cage two weeks ago. She said that it burns in her arch. She states that she hit her medial malleolus on the cage. The burning in her foot started first in her left foot 3 weeks ago. She was told that she had neuropathy. She was diagnosed with diabetes two weeks ago.  Has had previous MRI scan of her lumbar spine revealing some neural foraminal stenosis at L2-3 and L3-4  but no central stenosis.  She is not experiencing radiculopathy.  She is also had x-rays of her pelvis demonstrating significant arthritis particularly of her left hip with is very little joint space remaining  HPI  Review of Systems   Objective: Vital Signs: Ht 5\' 3"  (1.6 m)   Wt 222 lb (100.7 kg)   BMI 39.33 kg/m   Physical Exam Constitutional:      Appearance: She is well-developed.  Eyes:     Pupils: Pupils are equal, round, and reactive to light.  Pulmonary:     Effort: Pulmonary effort is normal.  Skin:    General: Skin is warm and dry.  Neurological:     Mental Status: She is alert and oriented to person, place, and time.  Psychiatric:        Behavior: Behavior normal.     Ortho Exam awake alert and oriented x3.  Comfortable sitting.  Straight leg raise was  negative.  There is considerable loss of motion of the left hip with internal and external rotation associated with groin pain.  This is consistent with the arthritis identified in her left hip by plain films.  She is having considerable burning along the medial aspect of both feet near the arch but not any pain along the plantar fascial.  She also has tingling in both of her feet and all of her toes.  I could not feel any pulses but she had good capillary refill to her toes.  The toes were warm.  She did not have any pain along the lateral aspect of either ankle and did have some slight decreased range of motion.  I think her exam is consistent with neuropathy rather than some other internal derangement Specialty Comments:  No specialty comments available.  Imaging: No results found.   PMFS History: Patient Active Problem List   Diagnosis Date Noted  . Bilateral primary osteoarthritis of hip 02/10/2020  . Left foot pain 12/20/2019  . Hypertension associated with diabetes ([place]) 12/20/2019  . Urinary frequency 11/30/2019  . Left hip pain 11/23/2019  . Trochanteric bursitis of left hip 11/23/2019  . Non-seasonal allergic rhinitis 07/13/2019  . Hyperlipidemia 11/14/2017  . 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 Diabetes mellitus without complication ([place]) [id number]/[id number]  . Complete tear of right rotator cuff 11/03/2014  . GERD 12/26/2006   Past Medical History:  Diagnosis Date  . Anxiety   . Bilateral carpal tunnel syndrome 03/03/2018  . Bronchitis   . Cervical spondylosis without myelopathy 07/29/2018  . Cervicalgia 11/16/2014  . Cervicogenic headache 07/29/2018  . Chronic bilateral low back pain with bilateral sciatica 09/18/2018  . Claudication ([place]) 09/03/2016  . Diverticulitis   . FIBROIDS, UTERUS 03/09/2010   Qualifier: Diagnosis of  By: Carlean Purl MD, [place]in Fibromyalgia   . Fibromyalgia 06/18/2014  . GERD (gastroesophageal reflux disease)   . Goiter 02/28/2013  . Headache     . History of cervical dysplasia 01/18/2016  . Hypertension associated with diabetes ([place]) 12/20/2019  . Left carpal tunnel syndrome 01/27/2018  . Pain in right ankle and joints of right foot 03/17/2019  . Plantar fasciitis   . Spinal stenosis of cervical region 07/29/2018  . Substance abuse ([place])    10 years ago ( cocaine)  . TIA (transient ischemic attack) 08/10/2016  . Tobacco abuse 01/18/2012  . Tobacco use disorder 04/17/2015    Family History  Problem Relation Age of Onset  . Cancer Father  lung  . Hypertension Mother   . Arthritis Mother   . Other Neg Hx        pituitary problem    Past Surgical History:  Procedure Laterality Date  . ANKLE SURGERY    . BACK SURGERY    . Bowel obstruction    . CARPAL TUNNEL RELEASE    . CERVICAL CONIZATION W/BX N/A 12/27/2015   Procedure: CONIZATION CERVIX WITH BIOPSY;  Surgeon: Terrance Mass, MD;  Location: [facility];  Service: Gynecology;  Laterality: N/A;  . DILATATION & CURETTAGE/HYSTEROSCOPY WITH MYOSURE N/A 12/27/2015   Procedure: DILATATION & CURETTAGE/HYSTEROSCOPY WITH MYOSURE;  Surgeon: Terrance Mass, MD;  Location: [facility];  Service: Gynecology;  Laterality: N/A;  . ECTOPIC PREGNANCY SURGERY    . EYE SURGERY     removed right eye;   . EYE SURGERY     Social History   Occupational History  . Not on file  Tobacco Use  . Smoking status: Former Smoker    Packs/day: 0.50    Years: 18.00    Pack years: 9.00    Types: Cigarettes    Quit date: 04/27/2019    Years since quitting: 0.7  . Smokeless tobacco: Never Used  . Tobacco comment: 1-800 number given   Vaping Use  . Vaping Use: Never used  Substance and Sexual Activity  . Alcohol use: Yes    Alcohol/week: 0.0 standard drinks    Comment: NEW YEARS  . 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 Sexual activity: Yes    Partners: Male    Birth control/protection: Post-menopausal

## 2020-02-11 ENCOUNTER — Other Ambulatory Visit: Payer: Medicare Other

## 2020-02-11 DIAGNOSIS — E119 Type 2 diabetes mellitus without complications: Secondary | ICD-10-CM

## 2020-02-12 LAB — URINE CULTURE

## 2020-02-12 LAB — BASIC METABOLIC PANEL
BUN/Creatinine Ratio: 23 (ref 12–28)
BUN: 19 mg/dL (ref 8–27)
CO2: 24 mmol/L (ref 20–29)
Calcium: 9.7 mg/dL (ref 8.7–10.3)
Chloride: 103 mmol/L (ref 96–106)
Creatinine, Ser: 0.84 mg/dL (ref 0.57–1.00)
GFR calc Af Amer: 87 mL/min/{1.73_m2} (ref >59)
GFR calc non Af Amer: 76 mL/min/{1.73_m2} (ref >59)
Glucose: 100 mg/dL — ABNORMAL HIGH (ref 65–99)
Potassium: 4.4 mmol/L (ref 3.5–5.2)
Sodium: 141 mmol/L (ref 134–144)

## 2020-02-13 ENCOUNTER — Other Ambulatory Visit: Payer: Self-pay | Admitting: Family Medicine

## 2020-02-14 ENCOUNTER — Other Ambulatory Visit: Payer: Self-pay | Admitting: Family Medicine

## 2020-02-14 MED ORDER — CEPHALEXIN 500 MG PO CAPS
500.0000 mg | ORAL_CAPSULE | Freq: Four times a day (QID) | ORAL | 0 refills | Status: AC
Start: 2020-02-14 — End: 2020-02-19

## 2020-02-15 ENCOUNTER — Encounter: Payer: Self-pay | Admitting: Family Medicine

## 2020-02-15 DIAGNOSIS — G629 Polyneuropathy, unspecified: Secondary | ICD-10-CM | POA: Insufficient documentation

## 2020-02-15 NOTE — Assessment & Plan Note (Signed)
Patient taking a significant amount of Tylenol.  Discussed with patient using no more than 1000 mg at a time and limiting that to 3 times daily.  Also taking Flexeril.  Patient sees it orthopedist as recently as earlier today.  They have discussed hip replacement.  After reviewing her x-rays from June and her increasing medication demand, I would strongly encourage hip replacement with this patient at each visit.

## 2020-02-15 NOTE — Assessment & Plan Note (Signed)
Sudden onset a few weeks ago.  Worsening.  Does not appear to be cauda equina syndrome.  Travels down the medial aspect of her feet bilaterally.  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  June 2021 lumbar x-ray shows L4-L5 anterolisthesis.  I believe the anterolisthesis may be the cause of her neuropathy.  We will get MRI of the lumbar spine and refer to neurosurgery.

## 2020-02-15 NOTE — Assessment & Plan Note (Signed)
Recently treated for UTI with nitrofurantoin.  Not currently having significant symptoms.  Possibility of treatment failure.  Leukocytes, protein, hemoglobin seen on dipstick.  Will get culture and treat as appropriate based on culture results.

## 2020-02-16 ENCOUNTER — Other Ambulatory Visit: Payer: Self-pay

## 2020-02-16 MED ORDER — CYCLOBENZAPRINE HCL 10 MG PO TABS
10.0000 mg | ORAL_TABLET | Freq: Three times a day (TID) | ORAL | 0 refills | Status: DC | PRN
Start: 2020-02-16 — End: 2020-02-23

## 2020-02-17 ENCOUNTER — Telehealth: Payer: Self-pay | Admitting: *Deleted

## 2020-02-17 NOTE — Telephone Encounter (Signed)
-----   Message from Benay Pike, MD sent at 02/15/2020  3:20 PM EDT ----- I added a MRI spine that needs to be scheduled and a neurosurgery referral.

## 2020-02-17 NOTE — Telephone Encounter (Signed)
MRI is scheduled for this pt and referral is in.Ander Wamser Zimmerman Rumple, CMA

## 2020-02-23 ENCOUNTER — Ambulatory Visit (INDEPENDENT_AMBULATORY_CARE_PROVIDER_SITE_OTHER): Payer: Medicare Other | Admitting: Physical Medicine and Rehabilitation

## 2020-02-23 ENCOUNTER — Ambulatory Visit: Payer: Self-pay

## 2020-02-23 ENCOUNTER — Other Ambulatory Visit: Payer: Self-pay

## 2020-02-23 ENCOUNTER — Encounter: Payer: Self-pay | Admitting: Physical Medicine and Rehabilitation

## 2020-02-23 DIAGNOSIS — M25552 Pain in left hip: Secondary | ICD-10-CM | POA: Diagnosis not present

## 2020-02-23 MED ORDER — CYCLOBENZAPRINE HCL 10 MG PO TABS: 10.0000 mg | ORAL_TABLET | Freq: Three times a day (TID) | ORAL | 0 refills | Status: DC | PRN

## 2020-02-23 NOTE — Progress Notes (Signed)
Pt state left hip pain. Pt state she cant walk with shoe on but she has to wear slippers. Pt state walking and sitting makes the pain worse. Pt state pain meds helps a little with pain.  Numeric Pain Rating Scale and Functional Assessment Average Pain 10   In the last MONTH (on 0-10 scale) has pain interfered with the following?  1. General activity like being  able to carry out your everyday physical activities such as walking, climbing stairs, carrying groceries, or moving a chair?  Rating(10)   +Driver, -BT, -Dye Allergies. '

## 2020-02-24 ENCOUNTER — Telehealth: Payer: Self-pay | Admitting: Orthopaedic Surgery

## 2020-02-24 NOTE — Telephone Encounter (Signed)
Called-we will see in the office in a month and evaluate cortisone injection to her hip.  Will consider referral to Dr. Ninfa [place] for hip surgery.  She needs to continue working on her weight as her BMI is 39

## 2020-02-24 NOTE — Telephone Encounter (Signed)
Please advise 

## 2020-02-24 NOTE — Telephone Encounter (Signed)
Patient had a hip injection with Dr. Ernestina Patches yesterday. She requested a call back to discuss getting a note from Dr. Durward Fortes for an aide.

## 2020-02-26 ENCOUNTER — Ambulatory Visit: Payer: Medicare Other | Admitting: Family Medicine

## 2020-03-06 MED ORDER — BUPIVACAINE HCL 0.25 % IJ SOLN
4.0000 mL | INTRAMUSCULAR | Status: AC | PRN
  Administered 2020-02-23: 4 mL via INTRA_ARTICULAR

## 2020-03-06 MED ORDER — TRIAMCINOLONE ACETONIDE 40 MG/ML IJ SUSP
60.0000 mg | INTRAMUSCULAR | Status: AC | PRN
  Administered 2020-02-23: 60 mg via INTRA_ARTICULAR

## 2020-03-06 NOTE — Progress Notes (Signed)
Alyssa Montgomery - 59 y.o. female MRN [id number]  Date of birth: 06/29/1960  Office Visit Note: Visit Date: 02/23/2020 PCP: Wilber Oliphant, MD Referred by: Wilber Oliphant, MD  Subjective: Chief Complaint  Patient presents with  . Left Hip - Pain   HPI:  Alyssa Montgomery is a 59 y.o. female who comes in today at the request of Dr. Joni Fears for planned Left anesthetic hip arthrogram with fluoroscopic guidance.  The patient has failed conservative care including home exercise, medications, time and activity modification.  This injection will be diagnostic and hopefully therapeutic.  Please see requesting physician notes for further details and justification.   ROS Otherwise per HPI.  Assessment & Plan: Visit Diagnoses:  1. Pain in left hip     Plan: No additional findings.   Meds & Orders:  Meds ordered this encounter  Medications  . cyclobenzaprine (FLEXERIL) 10 MG tablet    Sig: Take 1 tablet (10 mg total) by mouth 3 (three) times daily as needed for muscle spasms.    Dispense:  30 tablet    Refill:  0    Orders Placed This Encounter  Procedures  . Large Joint Inj  . XR C-ARM NO REPORT    Follow-up: No follow-ups on file.   Procedures: Large Joint Inj: L hip joint on 02/23/2020 3:00 PM Indications: pain and diagnostic evaluation Details: 22 G needle, anterior approach  Arthrogram: Yes  Medications: 4 mL bupivacaine 0.25 %; 60 mg triamcinolone acetonide 40 MG/ML Outcome: tolerated well, no immediate complications  Arthrogram demonstrated excellent flow of contrast throughout the joint surface without extravasation or obvious defect.  The patient had relief of symptoms during the anesthetic phase of the injection.  Procedure, treatment alternatives, risks and benefits explained, specific risks discussed. Consent was given by the patient. Immediately prior to procedure a time out was called to verify the correct patient, procedure, equipment, support staff and  site/side marked as required. Patient was prepped and draped in the usual sterile fashion.      No notes on file   Clinical History: MRI LUMBAR SPINE WITHOUT CONTRAST  TECHNIQUE: Multiplanar, multisequence MR imaging of the lumbar spine was performed. No intravenous contrast was administered.  COMPARISON:  Lumbar spine radiograph 01/29/2017  FINDINGS: Segmentation:  Standard.  Alignment:  Physiologic.  Vertebrae:  No fracture, evidence of discitis, or bone lesion.  Conus medullaris: Extends to the L1-2 level and appears normal.  Paraspinal and other soft tissues: Normal  Disc levels:  T11-T12: Small disc bulge. No spinal canal or neural foraminal stenosis.  T12-L1: Normal disc space and facets. No spinal canal or neuroforaminal stenosis.  L1-L2: Normal disc space and facets. No spinal canal or neuroforaminal stenosis.  L2-L3: Left foraminal disc protrusion with moderate left foraminal stenosis. No spinal canal stenosis or right neural foraminal stenosis.  L3-L4: Intermediate disc bulge with mild spinal canal stenosis. Moderate bilateral facet hypertrophy. Mild bilateral neural foraminal stenosis.  L4-L5: Normal disc space and facets. No spinal canal or neuroforaminal stenosis.  L5-S1: Normal disc space and facets. No spinal canal or neuroforaminal stenosis.  Visualized sacrum: Normal.  IMPRESSION: 1. 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 2. Mild bilateral L3-4 neural foraminal stenosis, multifactorial. 3. No lumbar spinal canal stenosis.   Electronically Signed   By: Ulyses Jarred M.D.   On: 01/30/2017 02:02     Objective:  VS:  HT:    WT:   BMI:  BP:   HR: bpm  TEMP: ( )  RESP:  Physical Exam   Imaging: No results found.

## 2020-03-10 ENCOUNTER — Ambulatory Visit
Admission: RE | Admit: 2020-03-10 | Discharge: 2020-03-10 | Disposition: A | Discharge status: Home / Self Care | Payer: Medicare Other | Source: Ambulatory Visit | Attending: Family Medicine | Admitting: Family Medicine

## 2020-03-10 ENCOUNTER — Other Ambulatory Visit: Payer: Self-pay

## 2020-03-10 DIAGNOSIS — M48061 Spinal stenosis, lumbar region without neurogenic claudication: Secondary | ICD-10-CM | POA: Diagnosis not present

## 2020-03-10 DIAGNOSIS — G629 Polyneuropathy, unspecified: Secondary | ICD-10-CM

## 2020-03-13 NOTE — Progress Notes (Deleted)
    SUBJECTIVE:   CHIEF COMPLAINT / HPI:   Neuropathy: referral to neurosurgery.    PERTINENT  PMH / PSH: ***  OBJECTIVE:   There were no vitals taken for this visit.  ***  ASSESSMENT/PLAN:   No problem-specific Assessment & Plan notes found for this encounter.     Benay Pike, MD [place]

## 2020-03-14 ENCOUNTER — Ambulatory Visit: Payer: Medicare Other | Admitting: Family Medicine

## 2020-03-19 ENCOUNTER — Other Ambulatory Visit: Payer: Self-pay | Admitting: Family Medicine

## 2020-03-25 ENCOUNTER — Other Ambulatory Visit: Payer: Self-pay | Admitting: *Deleted

## 2020-03-25 DIAGNOSIS — M545 Low back pain, unspecified: Secondary | ICD-10-CM

## 2020-03-27 NOTE — Progress Notes (Deleted)
    SUBJECTIVE:   CHIEF COMPLAINT / HPI:   MRI results: did she go to neurosurg?     PERTINENT  PMH / PSH: ***  OBJECTIVE:   There were no vitals taken for this visit.  ***  ASSESSMENT/PLAN:   No problem-specific Assessment & Plan notes found for this encounter.     Benay Pike, MD [place]

## 2020-03-28 ENCOUNTER — Ambulatory Visit: Payer: Medicare Other | Admitting: Family Medicine

## 2020-04-04 ENCOUNTER — Other Ambulatory Visit: Payer: Self-pay | Admitting: Family Medicine

## 2020-04-13 ENCOUNTER — Telehealth: Payer: Self-pay | Admitting: *Deleted

## 2020-04-13 NOTE — Telephone Encounter (Signed)
Opened in error. Christen Bame, CMA

## 2020-04-22 ENCOUNTER — Ambulatory Visit: Payer: Medicare Other | Admitting: Family Medicine

## 2020-04-25 ENCOUNTER — Other Ambulatory Visit: Payer: Self-pay

## 2020-04-25 ENCOUNTER — Encounter (HOSPITAL_COMMUNITY): Payer: Self-pay | Admitting: Emergency Medicine

## 2020-04-25 ENCOUNTER — Ambulatory Visit (HOSPITAL_COMMUNITY)
Admission: EM | Admit: 2020-04-25 | Discharge: 2020-04-25 | Disposition: A | Discharge status: Home / Self Care | Payer: Medicare Other | Attending: Urgent Care | Admitting: Urgent Care

## 2020-04-25 DIAGNOSIS — M25512 Pain in left shoulder: Secondary | ICD-10-CM

## 2020-04-25 DIAGNOSIS — M5432 Sciatica, left side: Secondary | ICD-10-CM | POA: Diagnosis not present

## 2020-04-25 DIAGNOSIS — M25552 Pain in left hip: Secondary | ICD-10-CM | POA: Diagnosis not present

## 2020-04-25 DIAGNOSIS — S46912A Strain of unspecified muscle, fascia and tendon at shoulder and upper arm level, left arm, initial encounter: Secondary | ICD-10-CM | POA: Diagnosis not present

## 2020-04-25 DIAGNOSIS — R350 Frequency of micturition: Secondary | ICD-10-CM | POA: Diagnosis not present

## 2020-04-25 LAB — POCT URINALYSIS DIPSTICK, ED / UC
Bilirubin Urine: NEGATIVE
Glucose, UA: NEGATIVE mg/dL
Hgb urine dipstick: NEGATIVE
Ketones, ur: NEGATIVE mg/dL
Nitrite: NEGATIVE
Protein, ur: NEGATIVE mg/dL
Specific Gravity, Urine: 1.025 (ref 1.005–1.030)
Urobilinogen, UA: 0.2 mg/dL (ref 0.0–1.0)
pH: 5 (ref 5.0–8.0)

## 2020-04-25 MED ORDER — EYE WASH OPHTH SOLN: 1.0000 [drp] | OPHTHALMIC | Status: DC | PRN

## 2020-04-25 MED ORDER — TETRACAINE HCL 0.5 % OP SOLN: 1.0000 [drp] | Freq: Once | OPHTHALMIC | Status: DC

## 2020-04-25 MED ORDER — TRIAMCINOLONE ACETONIDE 40 MG/ML IJ SUSP
INTRAMUSCULAR | Status: AC
  Filled 2020-04-25: qty 1

## 2020-04-25 MED ORDER — TRIAMCINOLONE ACETONIDE 40 MG/ML IJ SUSP
40.0000 mg | Freq: Once | INTRAMUSCULAR | Status: AC
  Administered 2020-04-25: 40 mg via INTRAMUSCULAR

## 2020-04-25 MED ORDER — NAPROXEN 500 MG PO TABS
500.0000 mg | ORAL_TABLET | Freq: Two times a day (BID) | ORAL | 0 refills | Status: DC
Start: 2020-04-25 — End: 2020-09-14

## 2020-04-25 MED ORDER — NITROFURANTOIN MONOHYD MACRO 100 MG PO CAPS
100.0000 mg | ORAL_CAPSULE | Freq: Two times a day (BID) | ORAL | 0 refills | Status: DC
Start: 2020-04-25 — End: 2020-08-11

## 2020-04-25 NOTE — ED Triage Notes (Signed)
Pt c/o fall on 11/1. Pt states that she tripped over the dog bed and fell on the hard wood floor. Pt states she is having left side hip and shoulder hip pain. Pt states she has been taking tylenol and using several rubs. Pt states she also has some urinary frequency with nocturia.

## 2020-04-25 NOTE — ED Provider Notes (Signed)
[place]    CSN: [id number] Arrival date & time: 04/25/20  1456      History   Chief Complaint Chief Complaint  Patient presents with  . Fall  . Hip Pain  . Shoulder Pain    HPI Alyssa Montgomery is a 59 y.o. female.   Patient presenting today for several complaints. Having nocturia and urinary frequency progressively worsening now for several days. States her UTIs always start this way and tend to continue to get worse. Denies fever, chills, abdominal pain, N/V, flank pain, vaginal sxs.  Not taking anything OTC for these sxs.  ALso having left shoulder and hip pain after a fall 2 weeks ago where she lost her balance at home. Already has underlying hip OA and bursitis, feels this fall exacerbated things and now the pain is in posterior left hip/buttock and runs down back of her leg sometimes causing weakness and numbness. Has tried tylenol, biofreeze and heat with very mild temporary relief. The shoulder is hurting her from about mid clavicle laterally on the left side.       Past Medical History:  Diagnosis Date  . Anxiety   . Bilateral carpal tunnel syndrome 03/03/2018  . Bronchitis   . Cervical spondylosis without myelopathy 07/29/2018  . Cervicalgia 11/16/2014  . Cervicogenic headache 07/29/2018  . Chronic bilateral low back pain with bilateral sciatica 09/18/2018  . Claudication ([place]) 09/03/2016  . Diverticulitis   . FIBROIDS, UTERUS 03/09/2010   Qualifier: Diagnosis of  By: Carlean Purl MD, [place] Fibromyalgia   . Fibromyalgia 06/18/2014  . GERD (gastroesophageal reflux disease)   . Goiter 02/28/2013  . Headache   . History of cervical dysplasia 01/18/2016  . Hypertension associated with diabetes ([place]) 12/20/2019  . Left carpal tunnel syndrome 01/27/2018  . Pain in right ankle and joints of right foot 03/17/2019  . Plantar fasciitis   . Spinal stenosis of cervical region 07/29/2018  . Substance abuse ([place])    10 years ago ( cocaine)  . TIA (transient  ischemic attack) 08/10/2016  . Tobacco abuse 01/18/2012  . Tobacco use disorder 04/17/2015    Patient Active Problem List   Diagnosis Date Noted  . Neuropathy 02/15/2020  . Bilateral primary osteoarthritis of hip 02/10/2020  . Left foot pain 12/20/2019  . Hypertension associated with diabetes ([place]) 12/20/2019  . Urinary frequency 11/30/2019  . Left hip pain 11/23/2019  . Trochanteric bursitis of left hip 11/23/2019  . Non-seasonal allergic rhinitis 07/13/2019  . Hyperlipidemia 11/14/2017  . 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 Other general symptoms and signs 09/06/2016  . Diabetes mellitus without complication ([place]) 35/45/6256  . Complete tear of right rotator cuff 11/03/2014  . UTI (urinary tract infection) 06/19/2012  . GERD 12/26/2006    Past Surgical History:  Procedure Laterality Date  . ANKLE SURGERY    . BACK SURGERY    . Bowel obstruction    . CARPAL TUNNEL RELEASE    . CERVICAL CONIZATION W/BX N/A 12/27/2015   Procedure: CONIZATION CERVIX WITH BIOPSY;  Surgeon: Terrance Mass, MD;  Location: [facility];  Service: Gynecology;  Laterality: N/A;  . DILATATION & CURETTAGE/HYSTEROSCOPY WITH MYOSURE N/A 12/27/2015   Procedure: DILATATION & CURETTAGE/HYSTEROSCOPY WITH MYOSURE;  Surgeon: Terrance Mass, MD;  Location: [facility];  Service: Gynecology;  Laterality: N/A;  . ECTOPIC PREGNANCY SURGERY    . EYE SURGERY     removed right eye;   . EYE SURGERY  OB History    Gravida  6   Para  2   Term  2   Preterm      AB  4   Living  2     SAB  2   TAB  1   Ectopic  1   Multiple      Live Births               Home Medications    Prior to Admission medications   Medication Sig Start Date End Date Taking? Authorizing Provider  acetaminophen-codeine (TYLENOL #3) 300-30 MG tablet Take 1 tablet by mouth every 8 (eight) hours as needed for moderate pain. 02/10/20   Garald Balding, MD  albuterol (VENTOLIN HFA) 108 (90 Base) MCG/ACT inhaler Inhale 2  puffs into the lungs every 6 (six) hours as needed for wheezing or shortness of breath. 11/20/19   Wilber Oliphant, MD  Calcium Carbonate-Simethicone (ALKA-SELTZER HEARTBURN + GAS) 750-80 MG CHEW Chew 1 tablet by mouth 2 (two) times daily as needed (heartburn).    [provider]  cyclobenzaprine (FLEXERIL) 10 MG tablet Take 1 tablet (10 mg total) by mouth 3 (three) times daily as needed for muscle spasms. 02/23/20   Magnus Sinning, MD  fluticasone (FLONASE) 50 MCG/ACT nasal spray Place 1 spray into both nostrils daily. 1 spray in each nostril every day 07/13/19   Wilber Oliphant, MD  gabapentin (NEURONTIN) 100 MG capsule 100 mg at night for the first 2 nights, 200 mg at night for night 3 through 4, 300 mg at night after that. 02/10/20   Benay Pike, MD  losartan (COZAAR) 25 MG tablet TAKE 1 TABLET BY MOUTH EVERY DAY 04/04/20   Wilber Oliphant, MD  metFORMIN (GLUCOPHAGE-XR) 500 MG 24 hr tablet Take 2 tablets (1,000 mg total) by mouth daily with breakfast. 01/13/20 04/12/20  Wilber Oliphant, MD  naproxen (NAPROSYN) 500 MG tablet Take 1 tablet (500 mg total) by mouth 2 (two) times daily with a meal. 01/14/20   Garald Balding, MD  naproxen (NAPROSYN) 500 MG tablet Take 1 tablet (500 mg total) by mouth 2 (two) times daily. 04/25/20   Volney American, PA-C  nitrofurantoin, macrocrystal-monohydrate, (MACROBID) 100 MG capsule Take 1 capsule (100 mg total) by mouth 2 (two) times daily. 04/25/20   Volney American, PA-C  pravastatin (PRAVACHOL) 20 MG tablet Take 1 tablet (20 mg total) by mouth daily. 11/20/19   Wilber Oliphant, MD    Family History Family History  Problem Relation Age of Onset  . Cancer Father        lung  . Hypertension Mother   . Arthritis Mother   . Other Neg Hx        pituitary problem    Social History Social History   Tobacco Use  . Smoking status: Former Smoker    Packs/day: 0.50    Years: 18.00    Pack years: 9.00    Types: Cigarettes    Quit date:  04/27/2019    Years since quitting: 0.9  . Smokeless tobacco: Never Used  . Tobacco comment: 1-800 number given   Vaping Use  . Vaping Use: Never used  Substance Use Topics  . Alcohol use: Yes    Alcohol/week: 0.0 standard drinks    Comment: NEW YEARS  . Drug use: No    Comment: clean 10 years crack cocaine     Allergies   Zanaflex [tizanidine hcl], Penicillins, and Sulfonamide  derivatives   Review of Systems Review of Systems PER HPI   Physical Exam Triage Vital Signs ED Triage Vitals  Enc Vitals Group     BP 04/25/20 1627 131/85     Pulse Rate 04/25/20 1627 85     Resp --      Temp 04/25/20 1627 98.2 F (36.8 C)     Temp Source 04/25/20 1627 Oral     SpO2 04/25/20 1627 100 %     Weight --      Height --      Head Circumference --      Peak Flow --      Pain Score 04/25/20 1629 10     Pain Loc --      Pain Edu? --      Excl. in [place]? --    No data found.  Updated Vital Signs BP 131/85 (BP Location: Right Arm)   Pulse 85   Temp 98.2 F (36.8 C) (Oral)   SpO2 100%   Visual Acuity Right Eye Distance:   Left Eye Distance:   Bilateral Distance:    Right Eye Near:   Left Eye Near:    Bilateral Near:     Physical Exam Vitals and nursing note reviewed.  Constitutional:      Appearance: Normal appearance. She is not ill-appearing.  HENT:     Head: Atraumatic.  Eyes:     Extraocular Movements: Extraocular movements intact.     Conjunctiva/sclera: Conjunctivae normal.  Cardiovascular:     Rate and Rhythm: Normal rate and regular rhythm.     Pulses: Normal pulses.     Heart sounds: Normal heart sounds.  Pulmonary:     Effort: Pulmonary effort is normal.     Breath sounds: Normal breath sounds.  Abdominal:     General: Bowel sounds are normal. There is no distension.     Palpations: Abdomen is soft.     Tenderness: There is no abdominal tenderness. There is no right CVA tenderness, left CVA tenderness or guarding.  Musculoskeletal:        General:  No swelling or deformity.     Cervical back: Normal range of motion and neck supple.     Comments: Decreased ROM left shoulder due to pain, particular overhead extension. Strength full and equal b/l UEs Antalgic gait, left gluteal ttp and ttp down IT band left leg - SLR b/l  Skin:    General: Skin is warm and dry.     Findings: No bruising or erythema.  Neurological:     Mental Status: She is alert and oriented to person, place, and time.     Sensory: No sensory deficit.  Psychiatric:        Mood and Affect: Mood normal.        Thought Content: Thought content normal.        Judgment: Judgment normal.      UC Treatments / Results  Labs (all labs ordered are listed, but only abnormal results are displayed) Labs Reviewed  POCT URINALYSIS DIPSTICK, ED / UC - Abnormal; Notable for the following components:      Result Value   Leukocytes,Ua SMALL (*)    All other components within normal limits  URINE CULTURE    EKG   Radiology No results found.  Procedures Procedures (including critical care time)  Medications Ordered in UC Medications  triamcinolone acetonide (KENALOG-40) injection 40 mg (40 mg Intramuscular Given 04/25/20 1739)    Initial Impression /  Assessment and Plan / UC Course  I have reviewed the triage vital signs and the nursing notes.  Pertinent labs & imaging results that were available during my care of the patient were reviewed by me and considered in my medical decision making (see chart for details).     U/A not convincing today for UTI, but given severity of her sxs and past hx of UTIs will start macrobid (pt requesting this medication) while awaiting the culture. Discussed increasing fluids, pelvic floor exercises. Will give IM kenalog for her sciatica and soreness from fall. No evidence of bony injury either area. COntinue muscle relaxers, start  Naproxen prn and continue tylenol, heat, massage. F/u with PCP if not improving. Did discuss risks of  hyperglycemia with kenalog injection with patient.   Final Clinical Impressions(s) / UC Diagnoses   Final diagnoses:  Urinary frequency  Left sided sciatica  Strain of left shoulder, initial encounter   Discharge Instructions   None    ED Prescriptions    Medication Sig Dispense Auth. Provider   nitrofurantoin, macrocrystal-monohydrate, (MACROBID) 100 MG capsule Take 1 capsule (100 mg total) by mouth 2 (two) times daily. 10 capsule Volney American, PA-C   naproxen (NAPROSYN) 500 MG tablet Take 1 tablet (500 mg total) by mouth 2 (two) times daily. 30 tablet Volney American, [place]     PDMP not reviewed this encounter.   Volney American, [place] 04/25/20 1911

## 2020-04-27 LAB — URINE CULTURE: Culture: >=100000 — AB

## 2020-04-27 NOTE — Progress Notes (Addendum)
    SUBJECTIVE:   CHIEF COMPLAINT / HPI:   Right sided foot numbness: woke up a week ago with right leg numbness.  Now The numbness is just in her right foot. The numbness fluctuates in intensity since the last appointment.    We discussed the patient's previous MRI results.    Hip pain: Pt states that Dr. Durward Fortes stated she needed to be under 200 lbs before they could do the surgery.  Pt states she has tried everything for pain in the past including gabapentin, lyrica, topicals, tylenol, and others.     PERTINENT  PMH / PSH: osteoarthritis  OBJECTIVE:   BP 112/72   Pulse 81   Ht 5\' 3"  (1.6 m)   Wt 228 lb (103.4 kg)   SpO2 100%   BMI 40.39 kg/m   Gen: alert, oriented.  No acute distress.   CV: RRR Pulm: LCTAB Ext: DP pulse 2+ b/l.  Vibratory sensation intact.  Decreased sensation in the sole of midfoot on the left foot.  Right foot normal sensation to pinprick. .    ASSESSMENT/PLAN:   Neuropathy Pt with decreased sensation on the feet and paresthesia of the fingers and toes.  Strength intact.  Lumbar spinal MRI showed mild spondylosis and spinal stenosis with degenerative facet changes.  Will refer to neurosurgery given the decreased sensation on exam  .    Bilateral primary osteoarthritis of hip Pt attempting to get hip replacement, but states orthopedist told her she would need to be under 200 lbs.  I informed the pt most operations require a BMI under 40 which would only require a weight loss of a few lbs, not 25.  Asked pt to clarify this requirement with her orthopedist.  Pt has tried just about every pain tratment option in the past with minimal relief.  Discussed w/ pt trying tramadol BID and stopping muscle relaxer (which she is out of).  Pt agreed to try this.  Gave pt 2 week prescription for Bid tramadol.       Benay Pike, MD [place]

## 2020-04-28 ENCOUNTER — Ambulatory Visit (INDEPENDENT_AMBULATORY_CARE_PROVIDER_SITE_OTHER): Payer: Medicare Other | Admitting: Family Medicine

## 2020-04-28 ENCOUNTER — Encounter: Payer: Self-pay | Admitting: Family Medicine

## 2020-04-28 ENCOUNTER — Other Ambulatory Visit: Payer: Self-pay

## 2020-04-28 DIAGNOSIS — G629 Polyneuropathy, unspecified: Secondary | ICD-10-CM | POA: Diagnosis not present

## 2020-04-28 DIAGNOSIS — M160 Bilateral primary osteoarthritis of hip: Secondary | ICD-10-CM

## 2020-04-28 MED ORDER — TRAMADOL HCL 50 MG PO TABS: 50.0000 mg | ORAL_TABLET | Freq: Two times a day (BID) | ORAL | 0 refills | Status: AC

## 2020-04-28 NOTE — Patient Instructions (Addendum)
It was nice to see you again today,  I will put in a neurosurgery referral.  Give them a week to call you back, if you have not heard back from them in a week please call us.  For most surgeries, they need to see a BMI under 40.  To get a BMI under 48 you only need to get down to 220 pounds.  Please contact Dr. Rudene Anda office to make sure that this is correct.  After you have seen the neurosurgeon, please schedule an appointment with [place] so that we can follow-up with what their recommendations are.  Have a great day,  Clemetine Marker, MD

## 2020-05-01 NOTE — Addendum Note (Signed)
Addended by: Benay Pike on: 05/01/2020 09:51 PM   Modules accepted: Orders

## 2020-05-01 NOTE — Assessment & Plan Note (Addendum)
Pt with decreased sensation on the feet and paresthesia of the fingers and toes.  Strength intact.  Lumbar spinal MRI showed mild spondylosis and spinal stenosis with degenerative facet changes.  Will refer to neurosurgery given the decreased sensation on exam  .

## 2020-05-01 NOTE — Assessment & Plan Note (Signed)
Pt attempting to get hip replacement, but states orthopedist told her she would need to be under 200 lbs.  I informed the pt most operations require a BMI under 40 which would only require a weight loss of a few lbs, not 25.  Asked pt to clarify this requirement with her orthopedist.  Pt has tried just about every pain tratment option in the past with minimal relief.  Discussed w/ pt trying tramadol BID and stopping muscle relaxer (which she is out of).  Pt agreed to try this.  Gave pt 2 week prescription for Bid tramadol.

## 2020-05-19 ENCOUNTER — Telehealth: Payer: Self-pay

## 2020-05-19 NOTE — Telephone Encounter (Signed)
Pt called requesting a refill for Flexerall to be sent to [facility] on [address].

## 2020-05-19 NOTE — Telephone Encounter (Signed)
Pt called requested a refill of Flexeril be sent to [facility] [address].

## 2020-05-27 ENCOUNTER — Other Ambulatory Visit: Payer: Self-pay | Admitting: Family Medicine

## 2020-05-27 ENCOUNTER — Telehealth: Payer: Self-pay | Admitting: Physical Medicine and Rehabilitation

## 2020-05-27 ENCOUNTER — Other Ambulatory Visit: Payer: Self-pay | Admitting: Physical Medicine and Rehabilitation

## 2020-05-27 DIAGNOSIS — G629 Polyneuropathy, unspecified: Secondary | ICD-10-CM | POA: Diagnosis not present

## 2020-05-27 NOTE — Telephone Encounter (Signed)
Patient called asking for an appt for injection in hip. Please call patient at [phone].

## 2020-05-30 NOTE — Telephone Encounter (Signed)
No efill see PCP or referring, says it was discontinued by another MD

## 2020-05-30 NOTE — Telephone Encounter (Signed)
Ok if helped, otherwise [employer]

## 2020-05-30 NOTE — Telephone Encounter (Signed)
Left hip injection on 02/23/20. Ok to repeat if helped, same problem/side, and no new injury?

## 2020-05-30 NOTE — Telephone Encounter (Signed)
Please advise 

## 2020-05-31 NOTE — Telephone Encounter (Signed)
Returned patient's call, but call cannot be completed at this time. Patient does have an appointment with Dr. Durward Fortes on 12/22 for left hip.

## 2020-05-31 NOTE — Telephone Encounter (Signed)
Called patient to advise. Call cannot be completed at this time.

## 2020-06-01 ENCOUNTER — Ambulatory Visit: Payer: Medicare Other | Admitting: Orthopaedic Surgery

## 2020-06-16 DIAGNOSIS — G629 Polyneuropathy, unspecified: Secondary | ICD-10-CM | POA: Diagnosis not present

## 2020-06-17 ENCOUNTER — Other Ambulatory Visit: Payer: Self-pay

## 2020-06-17 ENCOUNTER — Encounter: Payer: Self-pay | Admitting: Family Medicine

## 2020-06-17 ENCOUNTER — Ambulatory Visit (INDEPENDENT_AMBULATORY_CARE_PROVIDER_SITE_OTHER): Payer: Medicare Other | Admitting: Family Medicine

## 2020-06-17 ENCOUNTER — Ambulatory Visit: Payer: Medicare Other | Admitting: Family Medicine

## 2020-06-17 VITALS — BP 132/76 | HR 67 | Ht 65.0 in | Wt 226.4 lb

## 2020-06-17 DIAGNOSIS — E119 Type 2 diabetes mellitus without complications: Secondary | ICD-10-CM | POA: Diagnosis not present

## 2020-06-17 DIAGNOSIS — M160 Bilateral primary osteoarthritis of hip: Secondary | ICD-10-CM

## 2020-06-17 DIAGNOSIS — M79672 Pain in left foot: Secondary | ICD-10-CM

## 2020-06-17 LAB — POCT GLYCOSYLATED HEMOGLOBIN (HGB A1C): Hemoglobin A1C: 6.4 % — AB (ref 4.0–5.6)

## 2020-06-17 MED ORDER — OXYCODONE HCL 5 MG PO TABS: 5.0000 mg | ORAL_TABLET | Freq: Two times a day (BID) | ORAL | 0 refills | Status: AC | PRN

## 2020-06-17 MED ORDER — LIDOCAINE 5 % EX PTCH: 1.0000 | MEDICATED_PATCH | CUTANEOUS | 1 refills | Status: DC

## 2020-06-17 NOTE — Progress Notes (Signed)
    SUBJECTIVE:   CHIEF COMPLAINT / HPI:   Hip pain: Patient still having hip pain since her last visit.  The tramadol did not help very much.  She is still taking Tylenol, BC powder, using over-the-counter creams and the Lubriderm patch.  Has not used her Flexeril recently.  She is going to see a new Psychologist, sport and exercise but will not be able to see him until February 10.  She cannot get a steroid injection as this will interfere with her ability to get surgery for least 3 months.  Patient recently dropped a heavy object on her left foot and has had pain in the foot since that time.  She was able to walk and bear weight afterwards.  She has not had this evaluated since the injury.  She states the pain is nothing compared to the pain in her hip which is her main concern.   PERTINENT  PMH / PSH: Osteoarthritis  OBJECTIVE:   BP 132/76   Pulse 67   Ht 5\' 5"  (1.651 m)   Wt 226 lb 6.4 oz (102.7 kg)   SpO2 97%   BMI 37.67 kg/m   General: Alert and oriented.  Appears to be in a moderate amount of pain. CV: Regular rate and rhythm Pulmonary: Lungs clear to auscultation bilaterally MSK: Tenderness to palpation over the third metatarsal of the left foot directly below a small 1 to 2 mm eschar.  Normal range of motion of the toes and normal strength with dorsi and plantar flexion  ASSESSMENT/PLAN:   Bilateral primary osteoarthritis of hip Patient unable to see the new surgeon until February 10.  Still in a significant amount of pain.  Tramadol did not help significantly.  Will write prescription for twice a day oxycodone 5 mg as needed to last her until her appointment with the orthopedist.  Advised patient to avoid excessive NSAIDs especially BC powders.  Refilled prescription for Lidoderm patches.  We will get CMP to check for kidney and liver dysfunction given her Tylenol and NSAID use.  Left foot pain Patient dropped heavy object onto her foot a few weeks ago.  Concern for a metatarsal shaft fracture.   We will get x-ray of the foot to rule out fracture.     Benay Pike, MD [place]

## 2020-06-17 NOTE — Patient Instructions (Signed)
It was nice to see you again,  I have prescribed you oxycodone to take twice a day in addition to the medications you are already taking.  I have also prescribed for you the Lidoderm patch.  I would like to get an x-ray of your foot to make sure there is no fractures.  I will call you with results of the test when I get them.  Have a great day,  Clemetine Marker, MD

## 2020-06-18 LAB — COMPREHENSIVE METABOLIC PANEL
ALT: 21 IU/L (ref 0–32)
AST: 21 IU/L (ref 0–40)
Albumin/Globulin Ratio: 1.5 (ref 1.2–2.2)
Albumin: 4.3 g/dL (ref 3.8–4.9)
Alkaline Phosphatase: 110 IU/L (ref 44–121)
BUN/Creatinine Ratio: 21 (ref 12–28)
BUN: 18 mg/dL (ref 8–27)
Bilirubin Total: <0.2 mg/dL (ref 0.0–1.2)
CO2: 21 mmol/L (ref 20–29)
Calcium: 9.3 mg/dL (ref 8.7–10.3)
Chloride: 106 mmol/L (ref 96–106)
Creatinine, Ser: 0.87 mg/dL (ref 0.57–1.00)
GFR calc Af Amer: 84 mL/min/{1.73_m2} (ref >59)
GFR calc non Af Amer: 73 mL/min/{1.73_m2} (ref >59)
Globulin, Total: 2.8 g/dL (ref 1.5–4.5)
Glucose: 87 mg/dL (ref 65–99)
Potassium: 3.7 mmol/L (ref 3.5–5.2)
Sodium: 142 mmol/L (ref 134–144)
Total Protein: 7.1 g/dL (ref 6.0–8.5)

## 2020-06-19 NOTE — Assessment & Plan Note (Addendum)
Patient unable to see the new surgeon until February 10.  Still in a significant amount of pain.  Tramadol did not help significantly.  Will write prescription for twice a day oxycodone 5 mg as needed to last her until her appointment with the orthopedist.  Advised patient to avoid excessive NSAIDs especially BC powders.  Refilled prescription for Lidoderm patches.  We will get CMP to check for kidney and liver dysfunction given her Tylenol and NSAID use.

## 2020-06-19 NOTE — Assessment & Plan Note (Signed)
Patient dropped heavy object onto her foot a few weeks ago.  Concern for a metatarsal shaft fracture.  We will get x-ray of the foot to rule out fracture.

## 2020-06-20 NOTE — Telephone Encounter (Signed)
Patient was a no show for her appointment with Dr. Durward Fortes. Closing encounter.

## 2020-06-24 ENCOUNTER — Encounter: Payer: Medicare Other | Admitting: Obstetrics & Gynecology

## 2020-06-30 ENCOUNTER — Telehealth: Payer: Self-pay

## 2020-06-30 NOTE — Telephone Encounter (Signed)
Patient calls nurse line requesting returned phone call from Dr. Jeannine Kitten regarding procedure at [facility] Records have been scanned into chart on 1/14.   Patient states that she has also called [facility] and left message regarding speaking with someone regarding results.   Please advise.   Talbot Grumbling, RN

## 2020-07-05 NOTE — Telephone Encounter (Signed)
Spoke with patient regarding her results.  Advised her to follow up with the physician who ordered it because they may require more workup.

## 2020-07-07 DIAGNOSIS — G629 Polyneuropathy, unspecified: Secondary | ICD-10-CM | POA: Diagnosis not present

## 2020-07-07 DIAGNOSIS — I10 Essential (primary) hypertension: Secondary | ICD-10-CM | POA: Diagnosis not present

## 2020-07-12 ENCOUNTER — Other Ambulatory Visit: Payer: Self-pay | Admitting: Physical Medicine and Rehabilitation

## 2020-07-13 NOTE — Telephone Encounter (Signed)
Please advise 

## 2020-07-19 ENCOUNTER — Ambulatory Visit: Payer: Medicare Other | Admitting: Obstetrics & Gynecology

## 2020-07-21 DIAGNOSIS — M1612 Unilateral primary osteoarthritis, left hip: Secondary | ICD-10-CM | POA: Diagnosis not present

## 2020-07-31 ENCOUNTER — Telehealth: Payer: Self-pay | Admitting: Family Medicine

## 2020-07-31 NOTE — Telephone Encounter (Signed)
This patient needs an appointment with [place] for surgical clearance and will need labs drawn.  Can you call to schedule her an appt?

## 2020-08-01 NOTE — Telephone Encounter (Signed)
I have called and scheduled patient for clearance appointment.

## 2020-08-11 ENCOUNTER — Other Ambulatory Visit: Payer: Self-pay

## 2020-08-11 ENCOUNTER — Encounter: Payer: Self-pay | Admitting: Family Medicine

## 2020-08-11 ENCOUNTER — Ambulatory Visit (INDEPENDENT_AMBULATORY_CARE_PROVIDER_SITE_OTHER): Payer: Medicare Other | Admitting: Family Medicine

## 2020-08-11 VITALS — BP 122/76 | HR 79 | Ht 62.0 in | Wt 218.2 lb

## 2020-08-11 DIAGNOSIS — R350 Frequency of micturition: Secondary | ICD-10-CM | POA: Diagnosis not present

## 2020-08-11 DIAGNOSIS — R300 Dysuria: Secondary | ICD-10-CM | POA: Diagnosis not present

## 2020-08-11 DIAGNOSIS — E119 Type 2 diabetes mellitus without complications: Secondary | ICD-10-CM | POA: Diagnosis not present

## 2020-08-11 DIAGNOSIS — M549 Dorsalgia, unspecified: Secondary | ICD-10-CM

## 2020-08-11 DIAGNOSIS — Z419 Encounter for procedure for purposes other than remedying health state, unspecified: Secondary | ICD-10-CM

## 2020-08-11 DIAGNOSIS — Z01818 Encounter for other preprocedural examination: Secondary | ICD-10-CM

## 2020-08-11 LAB — POCT UA - MICROSCOPIC ONLY

## 2020-08-11 LAB — POCT URINALYSIS DIP (MANUAL ENTRY)
Bilirubin, UA: NEGATIVE
Glucose, UA: NEGATIVE mg/dL
Ketones, POC UA: NEGATIVE mg/dL
Nitrite, UA: NEGATIVE
Protein Ur, POC: 30 mg/dL — AB
Spec Grav, UA: >=1.03 — AB (ref 1.010–1.025)
Urobilinogen, UA: 0.2 E.U./dL
pH, UA: 5.5 (ref 5.0–8.0)

## 2020-08-11 LAB — POCT GLYCOSYLATED HEMOGLOBIN (HGB A1C): Hemoglobin A1C: 6 % — AB (ref 4.0–5.6)

## 2020-08-11 MED ORDER — TRAMADOL HCL 50 MG PO TABS: 50.0000 mg | ORAL_TABLET | Freq: Four times a day (QID) | ORAL | 1 refills | Status: DC | PRN

## 2020-08-11 NOTE — Patient Instructions (Signed)
It was nice to see you again today,  I will call you with the results of the test when I get them.  I have prescribed you tramadol with 1 refill.  Please follow-up after your surgery.  Have a great day,  Clemetine Marker, MD

## 2020-08-11 NOTE — Progress Notes (Signed)
    SUBJECTIVE:   CHIEF COMPLAINT / HPI:   Patient is here today for surgical clearance for her upcoming hip surgery April 20.  Currently taking 1 tablet of Metformin every other day at night.  Takes her pravastatin "every now and then".  No longer taking her gabapentin.  Not taking losartan.  Patient was told she will need to stop taking NSAIDs prior to the surgery.  She is already stopped taking naproxen.  Discussed with patient that this includes BC powder, Goody's, aspirin.  Patient also complains of UTI-like symptoms.  States her urine is cloudy and she is going more frequently.  Does not say she has a burning sensation when urinating but states it is a "pulling" like previous UTIs.  PERTINENT  PMH / PSH: Chronic hip pain, diabetes, HTN  OBJECTIVE:   BP 122/76   Pulse 79   Ht 5\' 2"  (1.575 m)   Wt 218 lb 3.2 oz (99 kg)   SpO2 99%   BMI 39.91 kg/m   General: Alert and oriented.  Accompanied by grandson. HEENT: PERRLA, EOMI CV: Regular rate and rhythm, no murmurs Pulmonary: Mostly auscultation bilaterally no wheezes or crackles. Extremities: No lower extremity edema.  ASSESSMENT/PLAN:   Pre-operative clearance Patient to have hip replacement surgery on April 20.  Was asked to provide surgical clearance by the orthopedist office.  Getting the lab testing that they asked work today.  Discussed with patient that she should not use BC powder, Goody's, aspirin, or any of the NSAIDs during the time the orthopedist told her not to take these.  Wrote patient prescription for tramadol that she can use until surgery for when she cannot take these other medications.  Urinary frequency UA showing leuk esterase, proteinuria, hematuria but not nitrites.  Will get culture.  If culture is positive we will treat.  Otherwise need to recheck urinalysis after her surgery to make sure hematuria and proteinuria has resolved     Benay Pike, MD [place]

## 2020-08-12 ENCOUNTER — Telehealth: Payer: Self-pay

## 2020-08-12 DIAGNOSIS — Z01818 Encounter for other preprocedural examination: Secondary | ICD-10-CM | POA: Insufficient documentation

## 2020-08-12 HISTORY — DX: Encounter for other preprocedural examination: Z01.818

## 2020-08-12 LAB — COMPREHENSIVE METABOLIC PANEL
ALT: 14 IU/L (ref 0–32)
AST: 16 IU/L (ref 0–40)
Albumin/Globulin Ratio: 1.6 (ref 1.2–2.2)
Albumin: 4.4 g/dL (ref 3.8–4.9)
Alkaline Phosphatase: 113 IU/L (ref 44–121)
BUN/Creatinine Ratio: 17 (ref 12–28)
BUN: 17 mg/dL (ref 8–27)
Bilirubin Total: <0.2 mg/dL (ref 0.0–1.2)
CO2: 19 mmol/L — ABNORMAL LOW (ref 20–29)
Calcium: 9.4 mg/dL (ref 8.7–10.3)
Chloride: 105 mmol/L (ref 96–106)
Creatinine, Ser: 1 mg/dL (ref 0.57–1.00)
Globulin, Total: 2.7 g/dL (ref 1.5–4.5)
Glucose: 94 mg/dL (ref 65–99)
Potassium: 3.9 mmol/L (ref 3.5–5.2)
Sodium: 143 mmol/L (ref 134–144)
Total Protein: 7.1 g/dL (ref 6.0–8.5)
eGFR: 64 mL/min/{1.73_m2} (ref >59)

## 2020-08-12 LAB — CBC
Hematocrit: 37.3 % (ref 34.0–46.6)
Hemoglobin: 12.1 g/dL (ref 11.1–15.9)
MCH: 26.7 pg (ref 26.6–33.0)
MCHC: 32.4 g/dL (ref 31.5–35.7)
MCV: 82 fL (ref 79–97)
Platelets: 274 10*3/uL (ref 150–450)
RBC: 4.54 x10E6/uL (ref 3.77–5.28)
RDW: 14.2 % (ref 11.7–15.4)
WBC: 6.2 10*3/uL (ref 3.4–10.8)

## 2020-08-12 LAB — PROTIME-INR
INR: 1 (ref 0.9–1.2)
Prothrombin Time: 10.6 s (ref 9.1–12.0)

## 2020-08-12 NOTE — Assessment & Plan Note (Signed)
Patient to have hip replacement surgery on April 20.  Was asked to provide surgical clearance by the orthopedist office.  Getting the lab testing that they asked work today.  Discussed with patient that she should not use BC powder, Goody's, aspirin, or any of the NSAIDs during the time the orthopedist told her not to take these.  Wrote patient prescription for tramadol that she can use until surgery for when she cannot take these other medications.

## 2020-08-12 NOTE — Assessment & Plan Note (Signed)
UA showing leuk esterase, proteinuria, hematuria but not nitrites.  Will get culture.  If culture is positive we will treat.  Otherwise need to recheck urinalysis after her surgery to make sure hematuria and proteinuria has resolved

## 2020-08-12 NOTE — Telephone Encounter (Signed)
Patient calls nurse line reporting continued symptoms of a UTI. Patient reports continued dysuria and she feels she can not empty her bladder. Patients urine culture is not back yet, however she does not want to go through the weekend with the continued symptoms. Please advise on treatment.

## 2020-08-13 LAB — URINE CULTURE

## 2020-08-15 ENCOUNTER — Other Ambulatory Visit: Payer: Self-pay | Admitting: Family Medicine

## 2020-08-15 MED ORDER — CEPHALEXIN 500 MG PO CAPS: 500.0000 mg | ORAL_CAPSULE | Freq: Four times a day (QID) | ORAL | 0 refills | Status: AC

## 2020-08-15 NOTE — Progress Notes (Signed)
Prescribed keflex for E. Coli UTI.

## 2020-08-15 NOTE — Telephone Encounter (Signed)
Prescribed keflex.  Left voicemail informing pt of this.

## 2020-08-17 ENCOUNTER — Encounter: Payer: Self-pay | Admitting: Family Medicine

## 2020-09-01 ENCOUNTER — Telehealth: Payer: Self-pay | Admitting: Family Medicine

## 2020-09-01 NOTE — Telephone Encounter (Signed)
Patient is needing Dr. Jeannine Kitten to place a referral for her to have a [place] come out to the house after her surgery that is on April 20th. But she states they are telling her to go ahead and get the doctor to send it in so it is in place at the time it's needed, also said she is going to need a referral for PT to come to house after surgery also and a nurse to check her incision. Please Advise. Thanks!

## 2020-09-03 ENCOUNTER — Other Ambulatory Visit: Payer: Self-pay | Admitting: Family Medicine

## 2020-09-03 DIAGNOSIS — M160 Bilateral primary osteoarthritis of hip: Secondary | ICD-10-CM

## 2020-09-03 NOTE — Telephone Encounter (Signed)
Order was placed.

## 2020-09-07 ENCOUNTER — Other Ambulatory Visit: Payer: Self-pay

## 2020-09-07 ENCOUNTER — Ambulatory Visit: Payer: Medicare Other | Admitting: Family Medicine

## 2020-09-07 ENCOUNTER — Encounter (HOSPITAL_COMMUNITY): Payer: Self-pay | Admitting: Orthopedic Surgery

## 2020-09-07 NOTE — H&P (Signed)
TOTAL HIP ADMISSION H&P  Patient is admitted for left total hip arthroplasty.  Subjective:  Chief Complaint: Left hip pain  HPI: Alyssa Montgomery, 60 y.o. female, has a history of pain and functional disability in the left hip due to arthritis and patient has failed non-surgical conservative treatments for greater than 12 weeks to include NSAID's and/or analgesics and activity modification. Onset of symptoms was abrupt, starting less than one year ago with rapidly worsening course since that time. The patient noted no past surgery on the left hip. Patient currently rates pain in the left hip at 9 out of 10 with activity. Patient has night pain, worsening of pain with activity and weight bearing, trendelenberg gait, pain that interfers with activities of daily living and pain with passive range of motion. 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 This condition presents safety issues increasing the risk of falls. There is no current active infection.  Patient Active Problem List   Diagnosis Date Noted  . Pre-operative clearance 08/12/2020  . Neuropathy 02/15/2020  . Bilateral primary osteoarthritis of hip 02/10/2020  . Left foot pain 12/20/2019  . Hypertension associated with diabetes ([place]) 12/20/2019  . Urinary frequency 11/30/2019  . Left hip pain 11/23/2019  . Trochanteric bursitis of left hip 11/23/2019  . Non-seasonal allergic rhinitis 07/13/2019  . Hyperlipidemia 11/14/2017  . 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 Other general symptoms and signs 09/06/2016  . Diabetes mellitus without complication ([place]) 95/18/8416  . Complete tear of right rotator cuff 11/03/2014  . UTI (urinary tract infection) 06/19/2012  . GERD 12/26/2006    Past Medical History:  Diagnosis Date  . Anxiety   . Bilateral carpal tunnel syndrome 03/03/2018  . Bronchitis   . Cervical  spondylosis without myelopathy 07/29/2018  . Cervicalgia 11/16/2014  . Cervicogenic headache 07/29/2018  . Chronic bilateral low back pain with bilateral sciatica 09/18/2018  . Claudication ([place]) 09/03/2016  . Diverticulitis   . FIBROIDS, UTERUS 03/09/2010   Qualifier: Diagnosis of  By: Carlean Purl MD, Dimas Millin Fibromyalgia   . Fibromyalgia 06/18/2014  . GERD (gastroesophageal reflux disease)   . Goiter 02/28/2013  . Headache   . History of cervical dysplasia 01/18/2016  . Hypertension associated with diabetes ([place]) 12/20/2019  . Left carpal tunnel syndrome 01/27/2018  . Pain in right ankle and joints of right foot 03/17/2019  . Plantar fasciitis   . Spinal stenosis of cervical region 07/29/2018  . Substance abuse ([place])    10 years ago ( cocaine)  . TIA (transient ischemic attack) 08/10/2016  . Tobacco abuse 01/18/2012  . Tobacco use disorder 04/17/2015    Past Surgical History:  Procedure Laterality Date  . ANKLE SURGERY    . BACK SURGERY    . Bowel obstruction    . CARPAL TUNNEL RELEASE    . CERVICAL CONIZATION W/BX N/A 12/27/2015   Procedure: CONIZATION CERVIX WITH BIOPSY;  Surgeon: Terrance Mass, MD;  Location: [facility];  Service: Gynecology;  Laterality: N/A;  . DILATATION & CURETTAGE/HYSTEROSCOPY WITH MYOSURE N/A 12/27/2015   Procedure: DILATATION & CURETTAGE/HYSTEROSCOPY WITH MYOSURE;  Surgeon: Terrance Mass, MD;  Location: [facility];  Service: Gynecology;  Laterality: N/A;  . ECTOPIC PREGNANCY SURGERY    . EYE SURGERY     removed right eye;   . EYE SURGERY      Prior to Admission medications  Medication Sig Start Date End Date Taking? Authorizing Provider  albuterol (VENTOLIN HFA) 108 (90 Base) MCG/ACT inhaler Inhale 2 puffs into the lungs every 6 (six) hours as needed for wheezing or shortness of breath. 11/20/19   Wilber Oliphant, MD  Calcium Carbonate-Simethicone (ALKA-SELTZER HEARTBURN + GAS) 750-80 MG CHEW Chew 1 tablet by mouth 2 (two) times daily as needed (heartburn).     [provider]  fluticasone (FLONASE) 50 MCG/ACT nasal spray Place 1 spray into both nostrils daily. 1 spray in each nostril every day 07/13/19   Wilber Oliphant, MD  gabapentin (NEURONTIN) 100 MG capsule 100 mg at night for the first 2 nights, 200 mg at night for night 3 through 4, 300 mg at night after that. 02/10/20   Benay Pike, MD  lidocaine (LIDODERM) 5 % Place 1 patch onto the skin daily. Remove & Discard patch within 12 hours or as directed by MD 06/17/20   Benay Pike, MD  losartan (COZAAR) 25 MG tablet TAKE 1 TABLET BY MOUTH EVERY DAY 04/04/20   Wilber Oliphant, MD  metFORMIN (GLUCOPHAGE-XR) 500 MG 24 hr tablet Take 2 tablets (1,000 mg total) by mouth daily with breakfast. 01/13/20 04/12/20  Wilber Oliphant, MD  naproxen (NAPROSYN) 500 MG tablet Take 1 tablet (500 mg total) by mouth 2 (two) times daily with a meal. 01/14/20   Garald Balding, MD  naproxen (NAPROSYN) 500 MG tablet Take 1 tablet (500 mg total) by mouth 2 (two) times daily. 04/25/20   Volney American, PA-C  pravastatin (PRAVACHOL) 20 MG tablet Take 1 tablet (20 mg total) by mouth daily. 11/20/19   Wilber Oliphant, MD  traMADol (ULTRAM) 50 MG tablet Take 1 tablet (50 mg total) by mouth every 6 (six) hours as needed. 08/11/20   Benay Pike, MD    Allergies  Allergen Reactions  . Zanaflex [Tizanidine Hcl] Other (See Comments)    Syncope  . Penicillins Other (See Comments)    Unknown childhood reaction. Has patient had a PCN reaction causing immediate rash, facial/tongue/throat swelling, SOB or lightheadedness with hypotension: Unknown Has patient had a PCN reaction causing severe rash involving mucus membranes or skin necrosis: Unknown Has patient had a PCN reaction that required hospitalization: Unknown Has patient had a PCN reaction occurring within the last 10 years: No If all of the above answers are "NO", then may proceed with Cephalosporin use.   . Sulfonamide Derivatives Itching    Social History    Socioeconomic History  . Marital status: Married    Spouse name: Not on file  . Number of children: Not on file  . Years of education: Not on file  . Highest education level: Not on file  Occupational History  . Not on file  Tobacco Use  . Smoking status: Former Smoker    Packs/day: 0.50    Years: 18.00    Pack years: 9.00    Types: Cigarettes    Quit date: 04/27/2019    Years since quitting: 1.3  . Smokeless tobacco: Never Used  . Tobacco comment: 1-800 number given   Vaping Use  . Vaping Use: Never used  Substance and Sexual Activity  . Alcohol use: Yes    Alcohol/week: 0.0 standard drinks    Comment: NEW YEARS  . 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 Sexual activity: Yes    Partners: Male    Birth control/protection: Post-menopausal  Other Topics  Concern  . Not on file  Social History Narrative  . Not on file   Social Determinants of Health   Financial Resource Strain: Not on file  Food Insecurity: Not on file  Transportation Needs: Not on file  Physical Activity: Not on file  Stress: Not on file  Social Connections: Not on file  Intimate Partner Violence: Not on file    Tobacco Use: Medium Risk  . Smoking Tobacco Use: Former Smoker  . Smokeless Tobacco Use: Never Used   Social History   Substance and Sexual Activity  Alcohol Use Yes  . Alcohol/week: 0.0 standard drinks   Comment: NEW YEARS    Family History  Problem Relation Age of Onset  . Cancer Father        lung  . Hypertension Mother   . Arthritis Mother   . Other Neg Hx        pituitary problem    Review of Systems  Constitutional: Negative for chills and fever.  HENT: Negative for congestion, sore throat and tinnitus.   Eyes: Negative for double vision, photophobia and pain.  Respiratory: Negative for cough, shortness of breath and wheezing.   Cardiovascular: Negative for chest pain, palpitations and orthopnea.  Gastrointestinal: Negative for heartburn, nausea  and vomiting.  Genitourinary: Negative for dysuria, frequency and urgency.  Musculoskeletal: Positive for joint pain.  Neurological: Negative for dizziness, weakness and headaches.     Objective:  Physical Exam: Well nourished and well developed.  General: Alert and oriented x3, cooperative and pleasant, no acute distress.  Head: normocephalic, atraumatic, neck supple.  Eyes: EOMI.  Respiratory: breath sounds clear in all fields, no wheezing, rales, or rhonchi. Cardiovascular: Regular rate and rhythm, no murmurs, gallops or rubs.  Abdomen: non-tender to palpation and soft, normoactive bowel sounds. Musculoskeletal:  Left Hip Exam:  The range of motion: Flexion to 90 degrees, Internal Rotation to 0 degrees, External Rotation to 0 degrees, and abduction to 10 degrees with severe pain.  There is no tenderness over the greater trochanteric bursa.   Calves soft and nontender. Motor function intact in LE. Strength 5/5 LE bilaterally. Neuro: Distal pulses 2+. Sensation to light touch intact in LE.  Imaging Review Plain radiographs demonstrate severe degenerative joint disease of the left hip. The bone quality appears to be adequate for age and reported activity level.  Assessment/Plan:  End stage arthritis, left hip  The patient history, physical examination, clinical judgement of the provider and imaging studies are consistent with end stage degenerative joint disease of the left hip and total hip arthroplasty is deemed medically necessary. The treatment options including medical management, injection therapy, arthroscopy and arthroplasty were discussed at length. The risks and benefits of total hip arthroplasty were presented and reviewed. The risks due to aseptic loosening, infection, stiffness, dislocation/subluxation, thromboembolic complications and other imponderables were discussed. The patient acknowledged the explanation, agreed to proceed with the plan and consent was signed.  Patient is being admitted for inpatient treatment for surgery, pain control, PT, OT, prophylactic antibiotics, VTE prophylaxis, progressive ambulation and ADLs and discharge planning.The patient is planning to be discharged home.   Patient's anticipated LOS is less than 2 midnights, meeting these requirements: - Younger than 64 - Lives within 1 hour of care - Has a competent adult at home to recover with post-op recover - NO history of  - Chronic pain requiring opiods  - Diabetes  - Coronary Artery Disease  - Heart failure  - Heart attack  - Stroke  -  DVT/VTE  - Cardiac arrhythmia  - Respiratory Failure/COPD  - Renal failure  - Anemia  - Advanced Liver disease  Therapy Plans: HEP Disposition: Home with husband Planned DVT Prophylaxis: Aspirin 325 mg BID DME Needed: None PCP: Addison Naegeli, MD (clearance received) TXA: IV Allergies: PCN (childhood allergy), sulfa Anesthesia Concerns: None BMI: 36.9 Last HgbA1c: 6.7% [facility]  - Patient was instructed on what medications to stop prior to surgery. - Follow-up visit in 2 weeks with Dr. Wynelle Link - Begin physical therapy following surgery - Pre-operative lab work as pre-surgical testing - Prescriptions will be provided in hospital at time of discharge  Theresa Duty, PA-C Orthopedic Surgery [facility]

## 2020-09-07 NOTE — Progress Notes (Addendum)
COVID Vaccine Completed:  x2 Date COVID Vaccine completed:  08-21-19 09-14-19 Has received booster: No COVID vaccine manufacturer: Pfizer      Date of COVID positive in last 90 days:  N/A  PCP - Dr.Olson Cardiologist -  Chest x-ray - N/A EKG - N/A Stress Test - N/A ECHO - N/A Cardiac Cath - N/A Pacemaker/ICD device last checked: Spinal Cord Stimulator:  Sleep Study - N/A CPAP -   Fasting Blood Sugar -  Checks Blood Sugar - does not check blood sugars at home  Blood Thinner Instructions:  N/A Aspirin Instructions: Last Dose:  Activity level:  Can go up a flight of stairs slowly due to hip pain, and perform activities of daily living without stopping and without symptoms of chest pain or shortness of breath.    Anesthesia review:  N/A  Patient denies shortness of breath, fever, cough and chest pain at PAT appointment (completed over the phone)   Patient verbalized understanding of instructions that were given to them at the PAT appointment. Patient was also instructed that they will need to review over the PAT instructions again at home before surgery.

## 2020-09-08 ENCOUNTER — Other Ambulatory Visit (HOSPITAL_COMMUNITY)
Admission: RE | Admit: 2020-09-08 | Discharge: 2020-09-08 | Disposition: A | Discharge status: Home / Self Care | Payer: Medicare Other | Source: Ambulatory Visit | Attending: Orthopedic Surgery | Admitting: Orthopedic Surgery

## 2020-09-08 ENCOUNTER — Ambulatory Visit: Payer: Medicare Other | Admitting: Family Medicine

## 2020-09-08 ENCOUNTER — Telehealth: Payer: Self-pay | Admitting: Family Medicine

## 2020-09-08 DIAGNOSIS — Z01812 Encounter for preprocedural laboratory examination: Secondary | ICD-10-CM | POA: Insufficient documentation

## 2020-09-08 DIAGNOSIS — Z20822 Contact with and (suspected) exposure to covid-19: Secondary | ICD-10-CM | POA: Insufficient documentation

## 2020-09-08 LAB — SARS CORONAVIRUS 2 (TAT 6-24 HRS): SARS Coronavirus 2: NEGATIVE

## 2020-09-08 NOTE — Telephone Encounter (Signed)
Patient is calling to inform Dr. Jeannine Kitten that she was not able to make yesterdays appointment or the ones rescheduled for today. She would like him to know that she will be having surgery on Monday 09/12/20 and will ask for them to recheck her urine while she is there.

## 2020-09-12 ENCOUNTER — Ambulatory Visit (HOSPITAL_COMMUNITY): Payer: Medicare Other | Admitting: Certified Registered Nurse Anesthetist

## 2020-09-12 ENCOUNTER — Encounter (HOSPITAL_COMMUNITY)
Admission: RE | Disposition: A | Discharge status: Home / Self Care | Payer: Self-pay | Source: Home / Self Care | Attending: Orthopedic Surgery

## 2020-09-12 ENCOUNTER — Other Ambulatory Visit: Payer: Self-pay

## 2020-09-12 ENCOUNTER — Encounter (HOSPITAL_COMMUNITY): Payer: Self-pay | Admitting: Orthopedic Surgery

## 2020-09-12 ENCOUNTER — Ambulatory Visit (HOSPITAL_COMMUNITY): Payer: Medicare Other

## 2020-09-12 ENCOUNTER — Observation Stay (HOSPITAL_COMMUNITY)
Admission: RE | Admit: 2020-09-12 | Discharge: 2020-09-14 | Disposition: A | Discharge status: Home / Self Care | Payer: Medicare Other | Attending: Orthopedic Surgery | Admitting: Orthopedic Surgery

## 2020-09-12 ENCOUNTER — Observation Stay (HOSPITAL_COMMUNITY): Payer: Medicare Other

## 2020-09-12 DIAGNOSIS — Z419 Encounter for procedure for purposes other than remedying health state, unspecified: Secondary | ICD-10-CM

## 2020-09-12 DIAGNOSIS — E119 Type 2 diabetes mellitus without complications: Secondary | ICD-10-CM | POA: Insufficient documentation

## 2020-09-12 DIAGNOSIS — Z87891 Personal history of nicotine dependence: Secondary | ICD-10-CM | POA: Diagnosis not present

## 2020-09-12 DIAGNOSIS — Z96642 Presence of left artificial hip joint: Secondary | ICD-10-CM | POA: Diagnosis not present

## 2020-09-12 DIAGNOSIS — I10 Essential (primary) hypertension: Secondary | ICD-10-CM | POA: Diagnosis not present

## 2020-09-12 DIAGNOSIS — Z79899 Other long term (current) drug therapy: Secondary | ICD-10-CM | POA: Insufficient documentation

## 2020-09-12 DIAGNOSIS — M169 Osteoarthritis of hip, unspecified: Secondary | ICD-10-CM | POA: Diagnosis present

## 2020-09-12 DIAGNOSIS — Z96649 Presence of unspecified artificial hip joint: Secondary | ICD-10-CM

## 2020-09-12 DIAGNOSIS — M1612 Unilateral primary osteoarthritis, left hip: Principal | ICD-10-CM | POA: Insufficient documentation

## 2020-09-12 DIAGNOSIS — Z471 Aftercare following joint replacement surgery: Secondary | ICD-10-CM | POA: Diagnosis not present

## 2020-09-12 DIAGNOSIS — Z7984 Long term (current) use of oral hypoglycemic drugs: Secondary | ICD-10-CM | POA: Diagnosis not present

## 2020-09-12 DIAGNOSIS — M25552 Pain in left hip: Secondary | ICD-10-CM | POA: Diagnosis present

## 2020-09-12 HISTORY — DX: Unilateral primary osteoarthritis, left hip: M16.12

## 2020-09-12 HISTORY — PX: TOTAL HIP ARTHROPLASTY: SHX124

## 2020-09-12 LAB — TYPE AND SCREEN
ABO/RH(D): A POS
Antibody Screen: NEGATIVE

## 2020-09-12 LAB — SURGICAL PCR SCREEN
MRSA, PCR: POSITIVE — AB
Staphylococcus aureus: POSITIVE — AB

## 2020-09-12 SURGERY — ARTHROPLASTY, HIP, TOTAL, ANTERIOR APPROACH
Anesthesia: Spinal | Site: Hip | Laterality: Left

## 2020-09-12 MED ORDER — FLUTICASONE PROPIONATE 50 MCG/ACT NA SUSP
1.0000 | Freq: Every day | NASAL | Status: DC | PRN
  Filled 2020-09-12: qty 16

## 2020-09-12 MED ORDER — ALBUTEROL SULFATE HFA 108 (90 BASE) MCG/ACT IN AERS: 2.0000 | INHALATION_SPRAY | Freq: Four times a day (QID) | RESPIRATORY_TRACT | Status: DC | PRN

## 2020-09-12 MED ORDER — MAGNESIUM CITRATE PO SOLN: 1.0000 | Freq: Once | ORAL | Status: DC | PRN

## 2020-09-12 MED ORDER — 0.9 % SODIUM CHLORIDE (POUR BTL) OPTIME
TOPICAL | Status: DC | PRN
  Administered 2020-09-12: 1000 mL

## 2020-09-12 MED ORDER — SODIUM CHLORIDE 0.9 % IV SOLN: INTRAVENOUS | Status: DC

## 2020-09-12 MED ORDER — ONDANSETRON HCL 4 MG/2ML IJ SOLN: 4.0000 mg | Freq: Once | INTRAMUSCULAR | Status: DC | PRN

## 2020-09-12 MED ORDER — PROPOFOL 10 MG/ML IV BOLUS
INTRAVENOUS | Status: AC
  Filled 2020-09-12: qty 20

## 2020-09-12 MED ORDER — FENTANYL CITRATE (PF) 100 MCG/2ML IJ SOLN: 25.0000 ug | INTRAMUSCULAR | Status: DC | PRN

## 2020-09-12 MED ORDER — BUPIVACAINE HCL 0.25 % IJ SOLN
INTRAMUSCULAR | Status: DC | PRN
  Administered 2020-09-12: 30 mL

## 2020-09-12 MED ORDER — PHENYLEPHRINE HCL-NACL 10-0.9 MG/250ML-% IV SOLN
INTRAVENOUS | Status: DC | PRN
  Administered 2020-09-12: 40 ug/min via INTRAVENOUS

## 2020-09-12 MED ORDER — ORAL CARE MOUTH RINSE: 15.0000 mL | Freq: Once | OROMUCOSAL | Status: AC

## 2020-09-12 MED ORDER — DEXAMETHASONE SODIUM PHOSPHATE 10 MG/ML IJ SOLN
10.0000 mg | Freq: Once | INTRAMUSCULAR | Status: AC
  Administered 2020-09-13: 10 mg via INTRAVENOUS
  Filled 2020-09-12: qty 1

## 2020-09-12 MED ORDER — ONDANSETRON HCL 4 MG/2ML IJ SOLN: 4.0000 mg | Freq: Four times a day (QID) | INTRAMUSCULAR | Status: DC | PRN

## 2020-09-12 MED ORDER — MORPHINE SULFATE (PF) 2 MG/ML IV SOLN
0.5000 mg | INTRAVENOUS | Status: DC | PRN
  Administered 2020-09-12 – 2020-09-13 (×5): 1 mg via INTRAVENOUS
  Filled 2020-09-12 (×5): qty 1

## 2020-09-12 MED ORDER — PHENYLEPHRINE 40 MCG/ML (10ML) SYRINGE FOR IV PUSH (FOR BLOOD PRESSURE SUPPORT)
PREFILLED_SYRINGE | INTRAVENOUS | Status: AC
  Filled 2020-09-12: qty 10

## 2020-09-12 MED ORDER — ONDANSETRON HCL 4 MG PO TABS: 4.0000 mg | ORAL_TABLET | Freq: Four times a day (QID) | ORAL | Status: DC | PRN

## 2020-09-12 MED ORDER — CEFAZOLIN SODIUM-DEXTROSE 2-4 GM/100ML-% IV SOLN
2.0000 g | INTRAVENOUS | Status: AC
  Administered 2020-09-12: 2 g via INTRAVENOUS
  Filled 2020-09-12: qty 100

## 2020-09-12 MED ORDER — ACETAMINOPHEN 325 MG PO TABS
325.0000 mg | ORAL_TABLET | Freq: Four times a day (QID) | ORAL | Status: DC | PRN
  Administered 2020-09-12 – 2020-09-14 (×5): 650 mg via ORAL
  Filled 2020-09-12 (×5): qty 2

## 2020-09-12 MED ORDER — POLYETHYLENE GLYCOL 3350 17 G PO PACK
17.0000 g | PACK | Freq: Every day | ORAL | Status: DC | PRN
  Administered 2020-09-14: 17 g via ORAL
  Filled 2020-09-12: qty 1

## 2020-09-12 MED ORDER — LACTATED RINGERS IV SOLN: INTRAVENOUS | Status: DC

## 2020-09-12 MED ORDER — METOCLOPRAMIDE HCL 5 MG PO TABS: 5.0000 mg | ORAL_TABLET | Freq: Three times a day (TID) | ORAL | Status: DC | PRN

## 2020-09-12 MED ORDER — TRAMADOL HCL 50 MG PO TABS
50.0000 mg | ORAL_TABLET | Freq: Four times a day (QID) | ORAL | Status: DC | PRN
  Administered 2020-09-13: 100 mg via ORAL
  Administered 2020-09-14: 50 mg via ORAL
  Administered 2020-09-14: 100 mg via ORAL
  Filled 2020-09-12 (×3): qty 2

## 2020-09-12 MED ORDER — PROPOFOL 10 MG/ML IV BOLUS
INTRAVENOUS | Status: DC | PRN
  Administered 2020-09-12: 50 mg via INTRAVENOUS
  Administered 2020-09-12 (×2): 25 mg via INTRAVENOUS

## 2020-09-12 MED ORDER — ACETAMINOPHEN 10 MG/ML IV SOLN
1000.0000 mg | Freq: Four times a day (QID) | INTRAVENOUS | Status: DC
  Administered 2020-09-12: 1000 mg via INTRAVENOUS
  Filled 2020-09-12: qty 100

## 2020-09-12 MED ORDER — BUPIVACAINE IN DEXTROSE 0.75-8.25 % IT SOLN
INTRATHECAL | Status: DC | PRN
  Administered 2020-09-12: 2 mL via INTRATHECAL

## 2020-09-12 MED ORDER — CALCIUM CARBONATE ANTACID 500 MG PO CHEW
1.0000 | CHEWABLE_TABLET | Freq: Three times a day (TID) | ORAL | Status: DC | PRN
  Administered 2020-09-12 – 2020-09-14 (×3): 200 mg via ORAL
  Filled 2020-09-12 (×3): qty 1

## 2020-09-12 MED ORDER — METOCLOPRAMIDE HCL 5 MG/ML IJ SOLN
5.0000 mg | Freq: Three times a day (TID) | INTRAMUSCULAR | Status: DC | PRN
Start: 2020-09-12 — End: 2020-09-14

## 2020-09-12 MED ORDER — OXYCODONE HCL 5 MG PO TABS
5.0000 mg | ORAL_TABLET | ORAL | Status: DC | PRN
  Administered 2020-09-12 (×2): 5 mg via ORAL
  Administered 2020-09-13 (×5): 10 mg via ORAL
  Administered 2020-09-14: 5 mg via ORAL
  Filled 2020-09-12 (×4): qty 2
  Filled 2020-09-12: qty 1
  Filled 2020-09-12 (×2): qty 2
  Filled 2020-09-12 (×2): qty 1

## 2020-09-12 MED ORDER — MENTHOL 3 MG MT LOZG: 1.0000 | LOZENGE | OROMUCOSAL | Status: DC | PRN

## 2020-09-12 MED ORDER — HYDROCODONE-ACETAMINOPHEN 7.5-325 MG PO TABS
1.0000 | ORAL_TABLET | ORAL | Status: DC | PRN
Start: 2020-09-12 — End: 2020-09-12

## 2020-09-12 MED ORDER — CEFAZOLIN SODIUM-DEXTROSE 2-4 GM/100ML-% IV SOLN
2.0000 g | Freq: Four times a day (QID) | INTRAVENOUS | Status: AC
  Administered 2020-09-12 – 2020-09-13 (×2): 2 g via INTRAVENOUS
  Filled 2020-09-12 (×2): qty 100

## 2020-09-12 MED ORDER — DEXAMETHASONE SODIUM PHOSPHATE 10 MG/ML IJ SOLN
8.0000 mg | Freq: Once | INTRAMUSCULAR | Status: AC
  Administered 2020-09-12: 8 mg via INTRAVENOUS

## 2020-09-12 MED ORDER — PROPOFOL 500 MG/50ML IV EMUL
INTRAVENOUS | Status: DC | PRN
  Administered 2020-09-12: 75 ug/kg/min via INTRAVENOUS

## 2020-09-12 MED ORDER — EPHEDRINE 5 MG/ML INJ
INTRAVENOUS | Status: AC
  Filled 2020-09-12: qty 10

## 2020-09-12 MED ORDER — CHLORHEXIDINE GLUCONATE 0.12 % MT SOLN
15.0000 mL | Freq: Once | OROMUCOSAL | Status: AC
  Administered 2020-09-12: 15 mL via OROMUCOSAL

## 2020-09-12 MED ORDER — PANTOPRAZOLE SODIUM 40 MG PO TBEC
40.0000 mg | DELAYED_RELEASE_TABLET | Freq: Every day | ORAL | Status: DC
  Administered 2020-09-12 – 2020-09-14 (×3): 40 mg via ORAL
  Filled 2020-09-12 (×3): qty 1

## 2020-09-12 MED ORDER — METHOCARBAMOL 500 MG PO TABS
500.0000 mg | ORAL_TABLET | Freq: Four times a day (QID) | ORAL | Status: DC | PRN
  Administered 2020-09-12 – 2020-09-13 (×5): 500 mg via ORAL
  Filled 2020-09-12 (×7): qty 1

## 2020-09-12 MED ORDER — PHENOL 1.4 % MT LIQD: 1.0000 | OROMUCOSAL | Status: DC | PRN

## 2020-09-12 MED ORDER — BUPIVACAINE-EPINEPHRINE (PF) 0.25% -1:200000 IJ SOLN
INTRAMUSCULAR | Status: AC
  Filled 2020-09-12: qty 30

## 2020-09-12 MED ORDER — POVIDONE-IODINE 10 % EX SWAB
2.0000 "application " | Freq: Once | CUTANEOUS | Status: AC
  Administered 2020-09-12: 2 via TOPICAL

## 2020-09-12 MED ORDER — DOCUSATE SODIUM 100 MG PO CAPS
100.0000 mg | ORAL_CAPSULE | Freq: Two times a day (BID) | ORAL | Status: DC
  Filled 2020-09-12 (×3): qty 1

## 2020-09-12 MED ORDER — PROPOFOL 500 MG/50ML IV EMUL
INTRAVENOUS | Status: AC
  Filled 2020-09-12: qty 150

## 2020-09-12 MED ORDER — METHOCARBAMOL 500 MG IVPB - SIMPLE MED
500.0000 mg | Freq: Four times a day (QID) | INTRAVENOUS | Status: DC | PRN
  Filled 2020-09-12: qty 50

## 2020-09-12 MED ORDER — HYDROCODONE-ACETAMINOPHEN 5-325 MG PO TABS: 1.0000 | ORAL_TABLET | ORAL | Status: DC | PRN

## 2020-09-12 MED ORDER — PHENYLEPHRINE HCL (PRESSORS) 10 MG/ML IV SOLN
INTRAVENOUS | Status: AC
  Filled 2020-09-12: qty 3

## 2020-09-12 MED ORDER — ONDANSETRON HCL 4 MG/2ML IJ SOLN
INTRAMUSCULAR | Status: DC | PRN
  Administered 2020-09-12: 4 mg via INTRAVENOUS

## 2020-09-12 MED ORDER — BISACODYL 10 MG RE SUPP
10.0000 mg | Freq: Every day | RECTAL | Status: DC | PRN
  Filled 2020-09-12: qty 1

## 2020-09-12 MED ORDER — ASPIRIN EC 325 MG PO TBEC
325.0000 mg | DELAYED_RELEASE_TABLET | Freq: Two times a day (BID) | ORAL | Status: DC
  Administered 2020-09-13 – 2020-09-14 (×3): 325 mg via ORAL
  Filled 2020-09-12 (×3): qty 1

## 2020-09-12 MED ORDER — HYDROCODONE-ACETAMINOPHEN 7.5-325 MG PO TABS: 1.0000 | ORAL_TABLET | ORAL | Status: DC | PRN

## 2020-09-12 MED ORDER — PROPOFOL 1000 MG/100ML IV EMUL
INTRAVENOUS | Status: AC
  Filled 2020-09-12: qty 200

## 2020-09-12 MED ORDER — TRANEXAMIC ACID-NACL 1000-0.7 MG/100ML-% IV SOLN
1000.0000 mg | INTRAVENOUS | Status: AC
  Administered 2020-09-12: 1000 mg via INTRAVENOUS
  Filled 2020-09-12: qty 100

## 2020-09-12 MED ORDER — ONDANSETRON HCL 4 MG/2ML IJ SOLN
INTRAMUSCULAR | Status: AC
  Filled 2020-09-12: qty 2

## 2020-09-12 MED ORDER — PHENYLEPHRINE 40 MCG/ML (10ML) SYRINGE FOR IV PUSH (FOR BLOOD PRESSURE SUPPORT)
PREFILLED_SYRINGE | INTRAVENOUS | Status: DC | PRN
  Administered 2020-09-12: 80 ug via INTRAVENOUS
  Administered 2020-09-12: 120 ug via INTRAVENOUS

## 2020-09-12 SURGICAL SUPPLY — 44 items
BAG DECANTER FOR FLEXI CONT (MISCELLANEOUS) IMPLANT
BAG ZIPLOCK 12X15 (MISCELLANEOUS) ×3 IMPLANT
BLADE SAG 18X100X1.27 (BLADE) ×3 IMPLANT
CLOSURE WOUND 1/2 X4 (GAUZE/BANDAGES/DRESSINGS) ×2
COVER PERINEAL POST (MISCELLANEOUS) ×3 IMPLANT
COVER SURGICAL LIGHT HANDLE (MISCELLANEOUS) ×3 IMPLANT
COVER WAND RF STERILE (DRAPES) IMPLANT
CUP ACETBLR 48 OD SECTOR II (Hips) ×3 IMPLANT
DECANTER SPIKE VIAL GLASS SM (MISCELLANEOUS) IMPLANT
DRAPE STERI IOBAN 125X83 (DRAPES) ×3 IMPLANT
DRAPE U-SHAPE 47X51 STRL (DRAPES) ×6 IMPLANT
DRSG AQUACEL AG ADV 3.5X10 (GAUZE/BANDAGES/DRESSINGS) ×3 IMPLANT
DURAPREP 26ML APPLICATOR (WOUND CARE) ×3 IMPLANT
ELECT REM PT RETURN 15FT ADLT (MISCELLANEOUS) ×3 IMPLANT
EVACUATOR 1/8 PVC DRAIN (DRAIN) IMPLANT
GLOVE SRG 8 PF TXTR STRL LF DI (GLOVE) ×1 IMPLANT
GLOVE SURG ENC MOIS LTX SZ6 (GLOVE) IMPLANT
GLOVE SURG ENC MOIS LTX SZ6.5 (GLOVE) ×3 IMPLANT
GLOVE SURG ENC MOIS LTX SZ8 (GLOVE) ×6 IMPLANT
GLOVE SURG ENC TEXT LTX SZ7 (GLOVE) ×3 IMPLANT
GLOVE SURG UNDER POLY LF SZ6.5 (GLOVE) ×3 IMPLANT
GLOVE SURG UNDER POLY LF SZ8 (GLOVE) ×3
GLOVE SURG UNDER POLY LF SZ8.5 (GLOVE) IMPLANT
GOWN STRL REUS W/TWL LRG LVL3 (GOWN DISPOSABLE) ×3 IMPLANT
GOWN STRL REUS W/TWL XL LVL3 (GOWN DISPOSABLE) IMPLANT
HEAD CERAMIC DELTA 28 P1.5 HIP (Head) ×3 IMPLANT
HOLDER FOLEY CATH W/STRAP (MISCELLANEOUS) ×3 IMPLANT
KIT TURNOVER KIT A (KITS) ×3 IMPLANT
LINER MARATHON 28 48 (Hips) ×3 IMPLANT
MANIFOLD NEPTUNE II (INSTRUMENTS) ×3 IMPLANT
PACK ANTERIOR HIP CUSTOM (KITS) ×3 IMPLANT
PENCIL SMOKE EVACUATOR COATED (MISCELLANEOUS) ×3 IMPLANT
STEM FEM SZ3 STD ACTIS (Stem) ×3 IMPLANT
STRIP CLOSURE SKIN 1/2X4 (GAUZE/BANDAGES/DRESSINGS) ×4 IMPLANT
SUT ETHIBOND NAB CT1 #1 30IN (SUTURE) ×3 IMPLANT
SUT MNCRL AB 4-0 PS2 18 (SUTURE) ×3 IMPLANT
SUT STRATAFIX 0 PDS 27 VIOLET (SUTURE) ×3
SUT VIC AB 2-0 CT1 27 (SUTURE) ×9
SUT VIC AB 2-0 CT1 TAPERPNT 27 (SUTURE) ×3 IMPLANT
SUTURE STRATFX 0 PDS 27 VIOLET (SUTURE) ×1 IMPLANT
SYR 50ML LL SCALE MARK (SYRINGE) IMPLANT
TRAY FOLEY MTR SLVR 14FR STAT (SET/KITS/TRAYS/PACK) ×3 IMPLANT
TRAY FOLEY MTR SLVR 16FR STAT (SET/KITS/TRAYS/PACK) IMPLANT
TUBE SUCTION HIGH CAP CLEAR NV (SUCTIONS) ×3 IMPLANT

## 2020-09-12 NOTE — Discharge Instructions (Signed)
Alyssa Arabian, MD Total Joint Specialist [facility] [address] [place], [place] [address] [phone]  ANTERIOR APPROACH TOTAL HIP REPLACEMENT POSTOPERATIVE DIRECTIONS     Hip Rehabilitation, Guidelines Following Surgery  The results of a hip operation are greatly improved after range of motion and muscle strengthening exercises. Follow all safety measures which are given to protect your hip. If any of these exercises cause increased pain or swelling in your joint, decrease the amount until you are comfortable again. Then slowly increase the exercises. Call your caregiver if you have problems or questions.   BLOOD CLOT PREVENTION . Take a 325 mg Aspirin two times a day for three weeks following surgery. Then take an 81 mg Aspirin once a day for three weeks. Then discontinue Aspirin. Dennis Bast may resume your vitamins/supplements upon discharge from the hospital. . Do not take any NSAIDs (Advil, Aleve, Ibuprofen, Meloxicam, etc.) until you have discontinued the 325 mg Aspirin.  HOME CARE INSTRUCTIONS  . Remove items at home which could result in a fall. This includes throw rugs or furniture in walking pathways.   ICE to the affected hip as frequently as 20-30 minutes an hour and then as needed for pain and swelling. Continue to use ice on the hip for pain and swelling from surgery. You may notice swelling that will progress down to the foot and ankle. This is normal after surgery. Elevate the leg when you are not up walking on it.    Continue to use the breathing machine which will help keep your temperature down.  It is common for your temperature to cycle up and down following surgery, especially at night when you are not up moving around and exerting yourself.  The breathing machine keeps your lungs expanded and your temperature down.  DIET You may resume your previous home diet once your are discharged from the hospital.  DRESSING / WOUND CARE /  SHOWERING . You have an adhesive waterproof bandage over the incision. Leave this in place until your first follow-up appointment. Once you remove this you will not need to place another bandage.  . You may begin showering 3 days following surgery, but do not submerge the incision under water.  ACTIVITY . For the first 3-5 days, it is important to rest and keep the operative leg elevated. You should, as a general rule, rest for 50 minutes and walk/stretch for 10 minutes per hour. After 5 days, you may slowly increase activity as tolerated.  Marland Kitchen Perform the exercises you were provided twice a day for about 15-20 minutes each session. Begin these 2 days following surgery. . Walk with your walker as instructed. Use the walker until you are comfortable transitioning to a cane. Walk with the cane in the opposite hand of the operative leg. You may discontinue the cane once you are comfortable and walking steadily. . Avoid periods of inactivity such as sitting longer than an hour when not asleep. This helps prevent blood clots.  . Do not drive a car for 6 weeks or until released by your surgeon.  . Do not drive while taking narcotics.  TED HOSE STOCKINGS Wear the elastic stockings on both legs for three weeks following surgery during the day. You may remove them at night while sleeping.  WEIGHT BEARING Weight bearing as tolerated with assist device (walker, cane, etc) as directed, use it as long as suggested by your surgeon or therapist, typically at least 4-6 weeks.  POSTOPERATIVE CONSTIPATION PROTOCOL Constipation -  defined medically as fewer than three stools per week and severe constipation as less than one stool per week.  One of the most common issues patients have following surgery is constipation.  Even if you have a regular bowel pattern at home, your normal regimen is likely to be disrupted due to multiple reasons following surgery.  Combination of anesthesia, postoperative narcotics, change in  appetite and fluid intake all can affect your bowels.  In order to avoid complications following surgery, here are some recommendations in order to help you during your recovery period.  . Colace (docusate) - Pick up an over-the-counter form of Colace or another stool softener and take twice a day as long as you are requiring postoperative pain medications.  Take with a full glass of water daily.  If you experience loose stools or diarrhea, hold the colace until you stool forms back up.  If your symptoms do not get better within 1 week or if they get worse, check with your doctor. . Dulcolax (bisacodyl) - Pick up over-the-counter and take as directed by the product packaging as needed to assist with the movement of your bowels.  Take with a full glass of water.  Use this product as needed if not relieved by Colace only.  . MiraLax (polyethylene glycol) - Pick up over-the-counter to have on hand.  MiraLax is a solution that will increase the amount of water in your bowels to assist with bowel movements.  Take as directed and can mix with a glass of water, juice, soda, coffee, or tea.  Take if you go more than two days without a movement.Do not use MiraLax more than once per day. Call your doctor if you are still constipated or irregular after using this medication for 7 days in a row.  If you continue to have problems with postoperative constipation, please contact the office for further assistance and recommendations.  If you experience "the worst abdominal pain ever" or develop nausea or vomiting, please contact the office immediatly for further recommendations for treatment.  ITCHING  If you experience itching with your medications, try taking only a single pain pill, or even half a pain pill at a time.  You can also use Benadryl over the counter for itching or also to help with sleep.   MEDICATIONS See your medication summary on the "After Visit Summary" that the nursing staff will review with you  prior to discharge.  You may have some home medications which will be placed on hold until you complete the course of blood thinner medication.  It is important for you to complete the blood thinner medication as prescribed by your surgeon.  Continue your approved medications as instructed at time of discharge.  PRECAUTIONS If you experience chest pain or shortness of breath - call 911 immediately for transfer to the hospital emergency department.  If you develop a fever greater that 101 F, purulent drainage from wound, increased redness or drainage from wound, foul odor from the wound/dressing, or calf pain - CONTACT YOUR SURGEON.                                                   FOLLOW-UP APPOINTMENTS Make sure you keep all of your appointments after your operation with your surgeon and caregivers. You should call the office at the above phone number and  make an appointment for approximately two weeks after the date of your surgery or on the date instructed by your surgeon outlined in the "After Visit Summary".  RANGE OF MOTION AND STRENGTHENING EXERCISES  These exercises are designed to help you keep full movement of your hip joint. Follow your caregiver's or physical therapist's instructions. Perform all exercises about fifteen times, three times per day or as directed. Exercise both hips, even if you have had only one joint replacement. These exercises can be done on a training (exercise) mat, on the floor, on a table or on a bed. Use whatever works the best and is most comfortable for you. Use music or television while you are exercising so that the exercises are a pleasant break in your day. This will make your life better with the exercises acting as a break in routine you can look forward to.  . Lying on your back, slowly slide your foot toward your buttocks, raising your knee up off the floor. Then slowly slide your foot back down until your leg is straight again.  . Lying on your back spread  your legs as far apart as you can without causing discomfort.  . Lying on your side, raise your upper leg and foot straight up from the floor as far as is comfortable. Slowly lower the leg and repeat.  . Lying on your back, tighten up the muscle in the front of your thigh (quadriceps muscles). You can do this by keeping your leg straight and trying to raise your heel off the floor. This helps strengthen the largest muscle supporting your knee.  . Lying on your back, tighten up the muscles of your buttocks both with the legs straight and with the knee bent at a comfortable angle while keeping your heel on the floor.   POST-OPERATIVE OPIOID TAPER INSTRUCTIONS: . It is important to wean off of your opioid medication as soon as possible. If you do not need pain medication after your surgery it is ok to stop day one. Marland Kitchen Opioids include: o Codeine, Hydrocodone(Norco, Vicodin), Oxycodone(Percocet, oxycontin) and hydromorphone amongst others.  . Long term and even short term use of opiods can cause: o Increased pain response o Dependence o Constipation o Depression o Respiratory depression o And more.  . Withdrawal symptoms can include o Flu like symptoms o Nausea, vomiting o And more . Techniques to manage these symptoms o Hydrate well o Eat regular healthy meals o Stay active o Use relaxation techniques(deep breathing, meditating, yoga) . Do Not substitute Alcohol to help with tapering . If you have been on opioids for less than two weeks and do not have pain than it is ok to stop all together.  . Plan to wean off of opioids o This plan should start within one week post op of your joint replacement. o Maintain the same interval or time between taking each dose and first decrease the dose.  o Cut the total daily intake of opioids by one tablet each day o Next start to increase the time between doses. o The last dose that should be eliminated is the evening dose.     IF YOU ARE TRANSFERRED  TO A SKILLED REHAB FACILITY If the patient is transferred to a skilled rehab facility following release from the hospital, a list of the current medications will be sent to the facility for the patient to continue.  When discharged from the skilled rehab facility, please have the facility set up the patient's Home  Health Physical Therapy prior to being released. Also, the skilled facility will be responsible for providing the patient with their medications at time of release from the facility to include their pain medication, the muscle relaxants, and their blood thinner medication. If the patient is still at the rehab facility at time of the two week follow up appointment, the skilled rehab facility will also need to assist the patient in arranging follow up appointment in our office and any transportation needs.  MAKE SURE YOU:  . Understand these instructions.  . Get help right away if you are not doing well or get worse.    DENTAL ANTIBIOTICS:  In most cases prophylactic antibiotics for Dental procdeures after total joint surgery are not necessary.  Exceptions are as follows:  1. History of prior total joint infection  2. Severely immunocompromised (Organ Transplant, cancer chemotherapy, Rheumatoid biologic meds such as [place])  3. Poorly controlled diabetes (A1C &gt; 8.0, blood glucose over 200)  If you have one of these conditions, contact your surgeon for an antibiotic prescription, prior to your dental procedure.    Pick up stool softner and laxative for home use following surgery while on pain medications. Do not submerge incision under water. Please use good hand washing techniques while changing dressing each day. May shower starting three days after surgery. Please use a clean towel to pat the incision dry following showers. Continue to use ice for pain and swelling after surgery. Do not use any lotions or creams on the incision until instructed by your surgeon.

## 2020-09-12 NOTE — Interval H&P Note (Signed)
History and Physical Interval Note:  09/12/2020 12:52 PM  Alyssa Montgomery  has presented today for surgery, with the diagnosis of Left hip osteoarthritis.  The various methods of treatment have been discussed with the patient and family. After consideration of risks, benefits and other options for treatment, the patient has consented to  Procedure(s) with comments: Savage (Left) - 159min as a surgical intervention.  The patient's history has been reviewed, patient examined, no change in status, stable for surgery.  I have reviewed the patient's chart and labs.  Questions were answered to the patient's satisfaction.     [name]

## 2020-09-12 NOTE — Transfer of Care (Signed)
Immediate Anesthesia Transfer of Care Note  Patient: Alyssa Montgomery  Procedure(s) Performed: TOTAL HIP ARTHROPLASTY ANTERIOR APPROACH (Left Hip)  Patient Location: PACU  Anesthesia Type:Spinal  Level of Consciousness: drowsy  Airway & Oxygen Therapy: Patient Spontanous Breathing and Patient connected to face mask oxygen  Post-op Assessment: Report given to RN and Post -op Vital signs reviewed and stable  Post vital signs: Reviewed and stable  Last Vitals:  Vitals Value Taken Time  BP 108/60 09/12/20 1550  Temp    Pulse 61 09/12/20 1554  Resp 12 09/12/20 1554  SpO2 100 % 09/12/20 1554  Vitals shown include unvalidated device data.  Last Pain:  Vitals:   09/12/20 1303  TempSrc: Oral  PainSc: 9       Patients Stated Pain Goal: 4 (85/50/15 8682)  Complications: No complications documented.

## 2020-09-12 NOTE — Anesthesia Procedure Notes (Addendum)
Spinal  Patient location during procedure: OR Start time: 09/12/2020 1:50 PM End time: 09/12/2020 1:57 PM Reason for block: surgical anesthesia Staffing Performed: anesthesiologist  Anesthesiologist: Catalina Gravel, MD Preanesthetic Checklist Completed: patient identified, IV checked, risks and benefits discussed, surgical consent, monitors and equipment checked, pre-op evaluation and timeout performed Spinal Block Patient position: sitting Prep: DuraPrep and site prepped and draped Patient monitoring: continuous pulse ox and blood pressure Approach: midline Location: L3-4 Injection technique: single-shot Needle Needle type: Pencan  Needle gauge: 24 G Assessment Sensory level: T6 Events: CSF return and second provider Additional Notes Functioning IV was confirmed and monitors were applied. Sterile prep and drape, including hand hygiene, mask and sterile gloves were used. The patient was positioned and the spine was prepped. The skin was anesthetized with lidocaine.  Free flow of clear CSF was obtained prior to injecting local anesthetic into the CSF.  The spinal needle aspirated freely following injection.  The needle was carefully withdrawn.  The patient tolerated the procedure well. Consent was obtained prior to procedure with all questions answered and concerns addressed. Risks including but not limited to bleeding, infection, nerve damage, paralysis, failed block, inadequate analgesia, allergic reaction, high spinal, itching and headache were discussed and the patient wished to proceed.   Attempt x1 by CRNA, unsuccessful.  Attempt x1 by MDA with CSF return.  Hoy Morn, MD

## 2020-09-12 NOTE — Op Note (Signed)
OPERATIVE REPORT- TOTAL HIP ARTHROPLASTY   PREOPERATIVE DIAGNOSIS: Osteoarthritis of the Left hip.   POSTOPERATIVE DIAGNOSIS: Osteoarthritis of the Left  hip.   PROCEDURE: Left total hip arthroplasty, anterior approach.   SURGEON: Gaynelle Arabian, MD   ASSISTANT: Theresa Duty, PA-C  ANESTHESIA:  Spinal  ESTIMATED BLOOD LOSS:-400 mL    DRAINS: Hemovac x1.   COMPLICATIONS: None   CONDITION: PACU - hemodynamically stable.   BRIEF CLINICAL NOTE: Alyssa Montgomery is a 60 y.o. female who has advanced end-  stage arthritis of their Left  hip with progressively worsening pain and  dysfunction.The patient has failed nonoperative management and presents for  total hip arthroplasty.   PROCEDURE IN DETAIL: After successful administration of spinal  anesthetic, the traction boots for the [facility] bed were placed on both  feet and the patient was placed onto the [facility] bed, boots placed into the leg  holders. The Left hip was then isolated from the perineum with plastic  drapes and prepped and draped in the usual sterile fashion. ASIS and  greater trochanter were marked and a oblique incision was made, starting  at about 1 cm lateral and 2 cm distal to the ASIS and coursing towards  the anterior cortex of the femur. The skin was cut with a 10 blade  through subcutaneous tissue to the level of the fascia overlying the  tensor fascia lata muscle. The fascia was then incised in line with the  incision at the junction of the anterior third and posterior 2/3rd. The  muscle was teased off the fascia and then the interval between the TFL  and the rectus was developed. The Hohmann retractor was then placed at  the top of the femoral neck over the capsule. The vessels overlying the  capsule were cauterized and the fat on top of the capsule was removed.  A Hohmann retractor was then placed anterior underneath the rectus  femoris to give exposure to the entire anterior capsule. A T-shaped   capsulotomy was performed. The edges were tagged and the femoral head  was identified.       Osteophytes are removed off the superior acetabulum.  The femoral neck was then cut in situ with an oscillating saw. Traction  was then applied to the left lower extremity utilizing the [facility]  traction. The femoral head was then removed. Retractors were placed  around the acetabulum and then circumferential removal of the labrum was  performed. Osteophytes were also removed. Reaming starts at 45 mm to  medialize and  Increased in 2 mm increments to 47 mm. We reamed in  approximately 40 degrees of abduction, 20 degrees anteversion. A 48 mm  pinnacle acetabular shell was then impacted in anatomic position under  fluoroscopic guidance with excellent purchase. We did not need to place  any additional dome screws. A 28 mm neutral + 4 marathon liner was then  placed into the acetabular shell.       The femoral lift was then placed along the lateral aspect of the femur  just distal to the vastus ridge. The leg was  externally rotated and capsule  was stripped off the inferior aspect of the femoral neck down to the  level of the lesser trochanter, this was done with electrocautery. The femur was lifted after this was performed. The  leg was then placed in an extended and adducted position essentially delivering the femur. We also removed the capsule superiorly and the piriformis from the piriformis  fossa to gain excellent exposure of the  proximal femur. Rongeur was used to remove some cancellous bone to get  into the lateral portion of the proximal femur for placement of the  initial starter reamer. The starter broaches was placed  the starter broach  and was shown to go down the center of the canal. Broaching  with the Actis system was then performed starting at size 0  coursing  Up to size 3. A size 3 had excellent torsional and rotational  and axial stability. The trial standard offset neck was then  placed  with a 28 + 1.5 trial head. The hip was then reduced. We confirmed that  the stem was in the canal both on AP and lateral x-rays. It also has excellent sizing. The hip was reduced with outstanding stability through full extension and full external rotation.. AP pelvis was taken and the leg lengths were measured and found to be equal. Hip was then dislocated again and the femoral head and neck removed. The  femoral broach was removed. Size 3 Actis stem with a standard offset  neck was then impacted into the femur following native anteversion. Has  excellent purchase in the canal. Excellent torsional and rotational and  axial stability. It is confirmed to be in the canal on AP and lateral  fluoroscopic views. The 28 + 1.5 ceramic head was placed and the hip  reduced with outstanding stability. Again AP pelvis was taken and it  confirmed that the leg lengths were equal. The wound was then copiously  irrigated with saline solution and the capsule reattached and repaired  with Ethibond suture. 30 ml of .25% Bupivicaine was  injected into the capsule and into the edge of the tensor fascia lata as well as subcutaneous tissue. The fascia overlying the tensor fascia lata was then closed with a running #1 V-Loc. Subcu was closed with interrupted 2-0 Vicryl and subcuticular running 4-0 Monocryl. Incision was cleaned  and dried. Steri-Strips and a bulky sterile dressing applied. Hemovac  drain was hooked to suction and then the patient was awakened and transported to  recovery in stable condition.        Please note that a surgical assistant was a medical necessity for this procedure to perform it in a safe and expeditious manner. Assistant was necessary to provide appropriate retraction of vital neurovascular structures and to prevent femoral fracture and allow for anatomic placement of the prosthesis.  Gaynelle Arabian, M.D.

## 2020-09-12 NOTE — Anesthesia Preprocedure Evaluation (Addendum)
Anesthesia Evaluation  Patient identified by MRN, date of birth, ID band Patient awake    Reviewed: Allergy & Precautions, NPO status , Patient's Chart, lab work & pertinent test results  Airway Mallampati: II  TM Distance: >3 FB Neck ROM: Full    Dental  (+) Dental Advisory Given, Poor Dentition   Pulmonary former smoker,    Pulmonary exam normal breath sounds clear to auscultation       Cardiovascular hypertension, Normal cardiovascular exam Rhythm:Regular Rate:Normal     Neuro/Psych  Headaches, PSYCHIATRIC DISORDERS Anxiety TIA Neuromuscular disease    GI/Hepatic GERD  ,(+)     substance abuse (remote cocaine abuse)  ,   Endo/Other  diabetes, Type 2, Oral Hypoglycemic AgentsObesity   Renal/GU negative Renal ROS     Musculoskeletal  (+) Arthritis , Osteoarthritis,  Fibromyalgia -  Abdominal   Peds  Hematology negative hematology ROS (+)   Anesthesia Other Findings Day of surgery medications reviewed with the patient.  Reproductive/Obstetrics                            Anesthesia Physical Anesthesia Plan  ASA: III  Anesthesia Plan: Spinal   Post-op Pain Management:    Induction: Intravenous  PONV Risk Score and Plan: 2 and Midazolam, Dexamethasone, Ondansetron and Propofol infusion  Airway Management Planned: Nasal Cannula and Natural Airway  Additional Equipment:   Intra-op Plan:   Post-operative Plan:   Informed Consent: I have reviewed the patients History and Physical, chart, labs and discussed the procedure including the risks, benefits and alternatives for the proposed anesthesia with the patient or authorized representative who has indicated his/her understanding and acceptance.     Dental advisory given  Plan Discussed with: CRNA  Anesthesia Plan Comments:         Anesthesia Quick Evaluation

## 2020-09-12 NOTE — Care Plan (Signed)
Ortho Bundle Case Management Note  Patient Details  Name: Alyssa Montgomery MRN: [id number] Date of Birth: 02-05-1961                  L THA on 09-28-20 DCP: Home with husband. 1 story home with 8 ste. DME: No needs. Has a RW and 3-in-1. PT: HEP   DME Arranged:  N/A DME Agency:     HH Arranged:    [place] Agency:     Additional Comments: Please contact me with any questions of if this plan should need to change.  Marianne Sofia, RN,CCM [facility]  [phone] 09/12/2020, 2:57 PM

## 2020-09-12 NOTE — Anesthesia Postprocedure Evaluation (Signed)
Anesthesia Post Note  Patient: Alyssa Montgomery  Procedure(s) Performed: TOTAL HIP ARTHROPLASTY ANTERIOR APPROACH (Left Hip)     Patient location during evaluation: PACU Anesthesia Type: Spinal Level of consciousness: oriented, awake and alert and awake Pain management: pain level controlled Vital Signs Assessment: post-procedure vital signs reviewed and stable Respiratory status: spontaneous breathing, respiratory function stable and nonlabored ventilation Cardiovascular status: blood pressure returned to baseline and stable Postop Assessment: no headache, no backache, no apparent nausea or vomiting, patient able to bend at knees and spinal receding Anesthetic complications: no   No complications documented.  Last Vitals:  Vitals:   09/12/20 1645 09/12/20 1700  BP: (!) 141/88 (!) 117/96  Pulse: 63 63  Resp: 15 17  Temp:    SpO2: 94% 100%    Last Pain:  Vitals:   09/12/20 1700  TempSrc:   PainSc: 0-No pain                 Catalina Gravel

## 2020-09-13 ENCOUNTER — Encounter (HOSPITAL_COMMUNITY): Payer: Self-pay | Admitting: Orthopedic Surgery

## 2020-09-13 DIAGNOSIS — E119 Type 2 diabetes mellitus without complications: Secondary | ICD-10-CM | POA: Diagnosis not present

## 2020-09-13 DIAGNOSIS — Z7984 Long term (current) use of oral hypoglycemic drugs: Secondary | ICD-10-CM | POA: Diagnosis not present

## 2020-09-13 DIAGNOSIS — Z79899 Other long term (current) drug therapy: Secondary | ICD-10-CM | POA: Diagnosis not present

## 2020-09-13 DIAGNOSIS — M1612 Unilateral primary osteoarthritis, left hip: Secondary | ICD-10-CM | POA: Diagnosis not present

## 2020-09-13 DIAGNOSIS — I10 Essential (primary) hypertension: Secondary | ICD-10-CM | POA: Diagnosis not present

## 2020-09-13 DIAGNOSIS — Z87891 Personal history of nicotine dependence: Secondary | ICD-10-CM | POA: Diagnosis not present

## 2020-09-13 LAB — BASIC METABOLIC PANEL
Anion gap: 11 (ref 5–15)
BUN: 11 mg/dL (ref 8–23)
CO2: 24 mmol/L (ref 22–32)
Calcium: 9.4 mg/dL (ref 8.9–10.3)
Chloride: 102 mmol/L (ref 98–111)
Creatinine, Ser: 0.87 mg/dL (ref 0.44–1.00)
GFR, Estimated: >60 mL/min (ref >60)
Glucose, Bld: 174 mg/dL — ABNORMAL HIGH (ref 70–99)
Potassium: 4 mmol/L (ref 3.5–5.1)
Sodium: 137 mmol/L (ref 135–145)

## 2020-09-13 LAB — CBC
HCT: 37 % (ref 36.0–46.0)
Hemoglobin: 11.7 g/dL — ABNORMAL LOW (ref 12.0–15.0)
MCH: 26.6 pg (ref 26.0–34.0)
MCHC: 31.6 g/dL (ref 30.0–36.0)
MCV: 84.1 fL (ref 80.0–100.0)
Platelets: 312 10*3/uL (ref 150–400)
RBC: 4.4 MIL/uL (ref 3.87–5.11)
RDW: 14.4 % (ref 11.5–15.5)
WBC: 16 10*3/uL — ABNORMAL HIGH (ref 4.0–10.5)
nRBC: 0 % (ref 0.0–0.2)

## 2020-09-13 LAB — ABO/RH: ABO/RH(D): A POS

## 2020-09-13 MED ORDER — SIMETHICONE 80 MG PO CHEW
80.0000 mg | CHEWABLE_TABLET | Freq: Four times a day (QID) | ORAL | Status: DC | PRN
  Administered 2020-09-13: 80 mg via ORAL
  Filled 2020-09-13 (×2): qty 1

## 2020-09-13 MED ORDER — MAGNESIUM HYDROXIDE 400 MG/5ML PO SUSP
30.0000 mL | Freq: Every day | ORAL | Status: DC | PRN
  Administered 2020-09-14: 30 mL via ORAL
  Filled 2020-09-13: qty 30

## 2020-09-13 NOTE — Plan of Care (Signed)
  Problem: Pain Management: Goal: Pain level will decrease with appropriate interventions Outcome: Progressing   Problem: Coping: Goal: Level of anxiety will decrease Outcome: Progressing   

## 2020-09-13 NOTE — Evaluation (Signed)
Physical Therapy Evaluation Patient Details Name: Alyssa Montgomery MRN: [id number] DOB: [birth year] Today's Date: 09/13/2020   History of Present Illness  60 y.o. female admitted 09/12/20 for L AA-THA. PMH: HTN, neuropathy, fibromyalgia, DM.  Clinical Impression  Pt is s/p THA resulting in the deficits listed below (see PT Problem List). Pt ambulated 100' with RW, distance limited by "18/10". Initiated THA HEP, good progress expected. Pt will benefit from skilled PT to increase their independence and safety with mobility to allow discharge to the venue listed below.      Follow Up Recommendations Follow surgeon's recommendation for DC plan and follow-up therapies    Equipment Recommendations  Rolling walker with 5" wheels;3in1 (PT)    Recommendations for Other Services       Precautions / Restrictions Precautions Precautions: Knee Precaution Booklet Issued: Yes (comment) Precaution Comments: reviewed no pillow under knee Restrictions Weight Bearing Restrictions: No Other Position/Activity Restrictions: WBAT      Mobility  Bed Mobility Overal bed mobility: Modified Independent             General bed mobility comments: increased time/effort, used bedrail, HOB up    Transfers Overall transfer level: Needs assistance Equipment used: Rolling walker (2 wheeled) Transfers: Sit to/from Stand Sit to Stand: Min assist         General transfer comment: VCs hand placement  Ambulation/Gait Ambulation/Gait assistance: Min guard Gait Distance (Feet): 100 Feet Assistive device: Rolling walker (2 wheeled) Gait Pattern/deviations: Step-to pattern;Decreased step length - right;Decreased step length - left Gait velocity: decr   General Gait Details: VCs sequencing, distance limited by pain, no loss of balance  Stairs            Wheelchair Mobility    Modified Rankin (Stroke Patients Only)       Balance Overall balance assessment: Modified Independent                                            Pertinent Vitals/Pain Pain Assessment: 0-10 Pain Score: 10-Worst pain ever Pain Location: L hip and thigh Pain Descriptors / Indicators: Sore Pain Intervention(s): Limited activity within patient's tolerance;Monitored during session;Premedicated before session;Ice applied    Home Living Family/patient expects to be discharged to:: Private residence Living Arrangements: Spouse/significant other;Parent Available Help at Discharge: Family;Available PRN/intermittently   Home Access: Stairs to enter Entrance Stairs-Rails: Right;Left;Can reach both Entrance Stairs-Number of Steps: 8 Home Layout: One level Home Equipment: Cane - single point      Prior Function Level of Independence: Independent with assistive device(s)         Comments: walked with [facility]     Hand Dominance        Extremity/Trunk Assessment   Upper Extremity Assessment Upper Extremity Assessment: Overall WFL for tasks assessed    Lower Extremity Assessment Lower Extremity Assessment: LLE deficits/detail LLE Deficits / Details: L hip flexion AAROM to 40*, knee ext -3/5 LLE: Unable to fully assess due to pain LLE Sensation: WNL LLE Coordination: WNL    Cervical / Trunk Assessment Cervical / Trunk Assessment: Normal  Communication   Communication: No difficulties  Cognition Arousal/Alertness: Awake/alert Behavior During Therapy: WFL for tasks assessed/performed Overall Cognitive Status: Within Functional Limits for tasks assessed  General Comments      Exercises Total Joint Exercises Ankle Circles/Pumps: AROM;Both;10 reps;Supine Quad Sets: AROM;Both;5 reps;Supine Short Arc Quad: AROM;Left;5 reps;Supine Heel Slides: AAROM;Left;10 reps;Supine Hip ABduction/ADduction: AAROM;Left;10 reps;Supine Long Arc Quad: AROM;Left;5 reps;Seated   Assessment/Plan    PT Assessment Patient needs continued PT  services  PT Problem List Decreased strength;Decreased mobility;Decreased activity tolerance;Decreased balance;Decreased knowledge of use of DME       PT Treatment Interventions DME instruction;Gait training;Stair training;Therapeutic exercise;Therapeutic activities;Patient/family education    PT Goals (Current goals can be found in the Care Plan section)  Acute Rehab PT Goals Patient Stated Goal: start a ministry to former addicts, write a book PT Goal Formulation: With patient Time For Goal Achievement: 09/20/20 Potential to Achieve Goals: Good    Frequency 7X/week   Barriers to discharge        Co-evaluation               AM-PAC PT "6 Clicks" Mobility  Outcome Measure Help needed turning from your back to your side while in a flat bed without using bedrails?: A Little Help needed moving from lying on your back to sitting on the side of a flat bed without using bedrails?: A Little Help needed moving to and from a bed to a chair (including a wheelchair)?: A Little Help needed standing up from a chair using your arms (e.g., wheelchair or bedside chair)?: A Little Help needed to walk in hospital room?: A Little Help needed climbing 3-5 steps with a railing? : A Lot 6 Click Score: 17    End of Session Equipment Utilized During Treatment: Gait belt Activity Tolerance: Patient limited by pain Patient left: in chair;with call bell/phone within reach;with chair alarm set Nurse Communication: Mobility status PT Visit Diagnosis: Difficulty in walking, not elsewhere classified (R26.2);Pain;Muscle weakness (generalized) (M62.81) Pain - Right/Left: Left Pain - part of body: Hip    Time: 1011-1040 PT Time Calculation (min) (ACUTE ONLY): 29 min   Charges:   PT Evaluation $PT Eval Low Complexity: 1 Low PT Treatments $Gait Training: 8-22 mins       Blondell Reveal Kistler PT 09/13/2020  Acute Rehabilitation Services Pager [phone] Office [phone]

## 2020-09-13 NOTE — TOC Transition Note (Addendum)
Transition of Care [facility]) - CM/SW Discharge Note   Patient Details  Name: Alyssa Montgomery MRN: [id number] Date of Birth: [birth year]  [facility] CM/SW Contact:  Lennart Pall, LCSW Phone Number: 09/14/2020, 10:25 AM   Clinical Narrative:    Met briefly with pt and confirming she has all needed DME.  Plan for HEP.  No further TOC needs.  09/14/20 ADDENDUM:  Pt now reporting she does need a rolling walker - ordered via [place].   Final next level of care: Home/Self Care Barriers to Discharge: Continued Medical Work up   Patient Goals and CMS Choice Patient states their goals for this hospitalization and ongoing recovery are:: to get pain under control and return home      Discharge Placement                       Discharge Plan and Services                DME Arranged: Walker rolling DME Agency: [place]                  Social Determinants of Health (SDOH) Interventions     Readmission Risk Interventions No flowsheet data found.

## 2020-09-13 NOTE — Progress Notes (Signed)
   Subjective: 1 Day Post-Op Procedure(s) (LRB): TOTAL HIP ARTHROPLASTY ANTERIOR APPROACH (Left) Patient reports pain as moderate.   Patient seen in rounds by Dr. Wynelle Link. Patient has no complaints other than pain. Pain medication switched from hydrocodone to oxycodone PO yesterday. Will continue to monitor. Denies chest pain or Sob. No issues overnight.  We will begin therapy today.   Objective: Vital signs in last 24 hours: Temp:  [97.5 F (36.4 C)-99.7 F (37.6 C)] 99.7 F (37.6 C) (04/05 0601) Pulse Rate:  [10-91] 91 (04/05 0601) Resp:  [13-20] 16 (04/05 0601) BP: (108-153)/(60-99) 141/81 (04/05 0601) SpO2:  [94 %-100 %] 97 % (04/05 0601) Weight:  [95.7 kg] 95.7 kg (04/04 1303)  Intake/Output from previous day:  Intake/Output Summary (Last 24 hours) at 09/13/2020 0713 Last data filed at 09/13/2020 0601 Gross per 24 hour  Intake 3352.9 ml  Output 3150 ml  Net 202.9 ml    Labs: CBC and BMP have not been completed yet this AM. Reordered.  Exam: General - Patient is Alert and Oriented Extremity - Neurologically intact Neurovascular intact Sensation intact distally Dorsiflexion/Plantar flexion intact Dressing - dressing C/D/I Motor Function - intact, moving foot and toes well on exam.   Past Medical History:  Diagnosis Date  . Anxiety   . Bilateral carpal tunnel syndrome 03/03/2018  . Bronchitis   . Cervical spondylosis without myelopathy 07/29/2018  . Cervicalgia 11/16/2014  . Cervicogenic headache 07/29/2018  . Chronic bilateral low back pain with bilateral sciatica 09/18/2018  . Claudication ([place]) 09/03/2016  . Diverticulitis   . FIBROIDS, UTERUS 03/09/2010   Qualifier: Diagnosis of  By: Carlean Purl MD, Dimas Millin Fibromyalgia   . Fibromyalgia 06/18/2014  . GERD (gastroesophageal reflux disease)   . Goiter 02/28/2013  . Headache   . History of cervical dysplasia 01/18/2016  . Hypertension associated with diabetes ([place]) 12/20/2019  . Left carpal tunnel syndrome 01/27/2018   . Pain in right ankle and joints of right foot 03/17/2019  . Plantar fasciitis   . Spinal stenosis of cervical region 07/29/2018  . Substance abuse ([place])    10 years ago ( cocaine)  . TIA (transient ischemic attack) 08/10/2016  . Tobacco abuse 01/18/2012  . Tobacco use disorder 04/17/2015    Assessment/Plan: 1 Day Post-Op Procedure(s) (LRB): TOTAL HIP ARTHROPLASTY ANTERIOR APPROACH (Left) Principal Problem:   OA (osteoarthritis) of hip Active Problems:   Primary osteoarthritis of left hip  Estimated body mass index is 36.22 kg/m as calculated from the following:   Height as of this encounter: 5\' 4"  (1.626 m).   Weight as of this encounter: 95.7 kg. Advance diet Up with therapy D/C IV fluids  DVT Prophylaxis - Aspirin Weight bearing as tolerated. Begin therapy.  Plan is to go Home after hospital stay with HEP. Possible discharge this afternoon if progresses with therapy and meeting her goals.  Theresa Duty, PA-C Orthopedic Surgery [phone] 09/13/2020, 7:13 AM

## 2020-09-13 NOTE — Progress Notes (Signed)
Physical Therapy Treatment Patient Details Name: Alyssa Montgomery MRN: [id number] DOB: March 11, 1961 Today's Date: 09/13/2020    History of Present Illness 60 y.o. female admitted 09/12/20 for L AA-THA. PMH: HTN, neuropathy, fibromyalgia, DM.    PT Comments    Pt ambulated 110' with RW, distance limited by 8/10 pain L hip. Pt is progressing well with mobility. Will plan to do stair training tomorrow morning, then pt should be ready to DC home.   Follow Up Recommendations  Follow surgeon's recommendation for DC plan and follow-up therapies     Equipment Recommendations  Rolling walker with 5" wheels;3in1 (PT)    Recommendations for Other Services       Precautions / Restrictions Precautions Precautions: Knee Precaution Booklet Issued: Yes (comment) Precaution Comments: reviewed no pillow under knee Restrictions Weight Bearing Restrictions: No Other Position/Activity Restrictions: WBAT    Mobility  Bed Mobility Overal bed mobility: Modified Independent             General bed mobility comments: up in recliner    Transfers Overall transfer level: Needs assistance Equipment used: Rolling walker (2 wheeled) Transfers: Sit to/from Stand Sit to Stand: Supervision         General transfer comment: VCs hand placement  Ambulation/Gait Ambulation/Gait assistance: Supervision Gait Distance (Feet): 110 Feet Assistive device: Rolling walker (2 wheeled) Gait Pattern/deviations: Step-to pattern;Decreased step length - right;Decreased step length - left Gait velocity: decr   General Gait Details: VCs sequencing, distance limited by pain, no loss of balance   Stairs             Wheelchair Mobility    Modified Rankin (Stroke Patients Only)       Balance Overall balance assessment: Modified Independent                                          Cognition Arousal/Alertness: Awake/alert Behavior During Therapy: WFL for tasks  assessed/performed Overall Cognitive Status: Within Functional Limits for tasks assessed                                        Exercises    General Comments        Pertinent Vitals/Pain Pain Assessment: 0-10 Pain Score: 8  Pain Location: L hip and thigh Pain Descriptors / Indicators: Sore Pain Intervention(s): Limited activity within patient's tolerance;Monitored during session;Premedicated before session;Ice applied    Home Living Family/patient expects to be discharged to:: Private residence Living Arrangements: Spouse/significant other;Parent Available Help at Discharge: Family;Available PRN/intermittently   Home Access: Stairs to enter Entrance Stairs-Rails: Right;Left;Can reach both Home Layout: One level Home Equipment: Cane - single point      Prior Function Level of Independence: Independent with assistive device(s)      Comments: walked with [facility]   PT Goals (current goals can now be found in the care plan section) Acute Rehab PT Goals Patient Stated Goal: start a ministry to former addicts, write a book PT Goal Formulation: With patient Time For Goal Achievement: 09/20/20 Potential to Achieve Goals: Good Progress towards PT goals: Progressing toward goals    Frequency    7X/week      PT Plan Current plan remains appropriate    Co-evaluation              AM-PAC  PT "6 Clicks" Mobility   Outcome Measure  Help needed turning from your back to your side while in a flat bed without using bedrails?: A Little Help needed moving from lying on your back to sitting on the side of a flat bed without using bedrails?: A Little Help needed moving to and from a bed to a chair (including a wheelchair)?: None Help needed standing up from a chair using your arms (e.g., wheelchair or bedside chair)?: None Help needed to walk in hospital room?: None Help needed climbing 3-5 steps with a railing? : A Little 6 Click Score: 21    End of Session  Equipment Utilized During Treatment: Gait belt Activity Tolerance: Patient limited by pain Patient left: in chair;with call bell/phone within reach;with chair alarm set Nurse Communication: Mobility status PT Visit Diagnosis: Difficulty in walking, not elsewhere classified (R26.2);Pain;Muscle weakness (generalized) (M62.81) Pain - Right/Left: Left Pain - part of body: Hip     Time: 4128-2081 PT Time Calculation (min) (ACUTE ONLY): 28 min  Charges:  $Gait Training: 8-22 mins $Therapeutic Activity: 8-22 mins                    Blondell Reveal Kistler PT 09/13/2020  Acute Rehabilitation Services Pager [phone] Office [phone]

## 2020-09-14 DIAGNOSIS — E119 Type 2 diabetes mellitus without complications: Secondary | ICD-10-CM | POA: Diagnosis not present

## 2020-09-14 DIAGNOSIS — I10 Essential (primary) hypertension: Secondary | ICD-10-CM | POA: Diagnosis not present

## 2020-09-14 DIAGNOSIS — Z79899 Other long term (current) drug therapy: Secondary | ICD-10-CM | POA: Diagnosis not present

## 2020-09-14 DIAGNOSIS — M1612 Unilateral primary osteoarthritis, left hip: Secondary | ICD-10-CM | POA: Diagnosis not present

## 2020-09-14 DIAGNOSIS — Z7984 Long term (current) use of oral hypoglycemic drugs: Secondary | ICD-10-CM | POA: Diagnosis not present

## 2020-09-14 DIAGNOSIS — Z87891 Personal history of nicotine dependence: Secondary | ICD-10-CM | POA: Diagnosis not present

## 2020-09-14 LAB — BASIC METABOLIC PANEL
Anion gap: 11 (ref 5–15)
BUN: 16 mg/dL (ref 8–23)
CO2: 24 mmol/L (ref 22–32)
Calcium: 9.3 mg/dL (ref 8.9–10.3)
Chloride: 102 mmol/L (ref 98–111)
Creatinine, Ser: 0.71 mg/dL (ref 0.44–1.00)
GFR, Estimated: >60 mL/min (ref >60)
Glucose, Bld: 188 mg/dL — ABNORMAL HIGH (ref 70–99)
Potassium: 3.8 mmol/L (ref 3.5–5.1)
Sodium: 137 mmol/L (ref 135–145)

## 2020-09-14 LAB — CBC
HCT: 35.7 % — ABNORMAL LOW (ref 36.0–46.0)
Hemoglobin: 11.2 g/dL — ABNORMAL LOW (ref 12.0–15.0)
MCH: 26.8 pg (ref 26.0–34.0)
MCHC: 31.4 g/dL (ref 30.0–36.0)
MCV: 85.4 fL (ref 80.0–100.0)
Platelets: 281 10*3/uL (ref 150–400)
RBC: 4.18 MIL/uL (ref 3.87–5.11)
RDW: 14.6 % (ref 11.5–15.5)
WBC: 16.4 10*3/uL — ABNORMAL HIGH (ref 4.0–10.5)
nRBC: 0 % (ref 0.0–0.2)

## 2020-09-14 MED ORDER — OXYCODONE HCL 5 MG PO TABS: 5.0000 mg | ORAL_TABLET | Freq: Four times a day (QID) | ORAL | 0 refills | Status: DC | PRN

## 2020-09-14 MED ORDER — ASPIRIN 325 MG PO TBEC: 325.0000 mg | DELAYED_RELEASE_TABLET | Freq: Two times a day (BID) | ORAL | 0 refills | Status: AC

## 2020-09-14 MED ORDER — TRAMADOL HCL 50 MG PO TABS: 50.0000 mg | ORAL_TABLET | Freq: Four times a day (QID) | ORAL | 0 refills | Status: DC | PRN

## 2020-09-14 MED ORDER — METHOCARBAMOL 500 MG PO TABS
500.0000 mg | ORAL_TABLET | Freq: Four times a day (QID) | ORAL | 0 refills | Status: DC | PRN
Start: 2020-09-14 — End: 2020-11-22

## 2020-09-14 NOTE — Progress Notes (Signed)
Subjective: 2 Days Post-Op Procedure(s) (LRB): TOTAL HIP ARTHROPLASTY ANTERIOR APPROACH (Left) Patient reports pain as moderate.   Patient seen in rounds with Dr. Wynelle Link. Patient is improved this AM, reports soreness/pain in the left thigh and knee. Encouraged patient to use muscle relaxer for this as needed. No issues overnight, did well with therapy yesterday. Voiding without difficulty. Plan is to go Home after hospital stay.  Objective: Vital signs in last 24 hours: Temp:  [98.3 F (36.8 C)-98.7 F (37.1 C)] 98.3 F (36.8 C) (04/06 0500) Pulse Rate:  [73-79] 73 (04/06 0500) Resp:  [16-17] 16 (04/06 0500) BP: (120-142)/(76-89) 127/76 (04/06 0500) SpO2:  [98 %-99 %] 98 % (04/06 0500)  Intake/Output from previous day:  Intake/Output Summary (Last 24 hours) at 09/14/2020 0734 Last data filed at 09/14/2020 0600 Gross per 24 hour  Intake 921.21 ml  Output 1400 ml  Net -478.79 ml    Intake/Output this shift: No intake/output data recorded.  Labs: Recent Labs    09/13/20 1108 09/14/20 0259  HGB 11.7* 11.2*   Recent Labs    09/13/20 1108 09/14/20 0259  WBC 16.0* 16.4*  RBC 4.40 4.18  HCT 37.0 35.7*  PLT 312 281   Recent Labs    09/13/20 1108 09/14/20 0259  NA 137 137  K 4.0 3.8  CL 102 102  CO2 24 24  BUN 11 16  CREATININE 0.87 0.71  GLUCOSE 174* 188*  CALCIUM 9.4 9.3   No results for input(s): LABPT, INR in the last 72 hours.  Exam: General - Patient is Alert and Oriented Extremity - Neurologically intact Neurovascular intact Sensation intact distally Dorsiflexion/Plantar flexion intact Dressing/Incision - clean, dry, no drainage Motor Function - intact, moving foot and toes well on exam.   Past Medical History:  Diagnosis Date  . Anxiety   . Bilateral carpal tunnel syndrome 03/03/2018  . Bronchitis   . Cervical spondylosis without myelopathy 07/29/2018  . Cervicalgia 11/16/2014  . Cervicogenic headache 07/29/2018  . Chronic bilateral low back  pain with bilateral sciatica 09/18/2018  . Claudication ([place]) 09/03/2016  . Diverticulitis   . FIBROIDS, UTERUS 03/09/2010   Qualifier: Diagnosis of  By: Carlean Purl MD, Dimas Millin Fibromyalgia   . Fibromyalgia 06/18/2014  . GERD (gastroesophageal reflux disease)   . Goiter 02/28/2013  . Headache   . History of cervical dysplasia 01/18/2016  . Hypertension associated with diabetes ([place]) 12/20/2019  . Left carpal tunnel syndrome 01/27/2018  . Pain in right ankle and joints of right foot 03/17/2019  . Plantar fasciitis   . Spinal stenosis of cervical region 07/29/2018  . Substance abuse ([place])    10 years ago ( cocaine)  . TIA (transient ischemic attack) 08/10/2016  . Tobacco abuse 01/18/2012  . Tobacco use disorder 04/17/2015    Assessment/Plan: 2 Days Post-Op Procedure(s) (LRB): TOTAL HIP ARTHROPLASTY ANTERIOR APPROACH (Left) Principal Problem:   OA (osteoarthritis) of hip Active Problems:   Primary osteoarthritis of left hip  Estimated body mass index is 36.22 kg/m as calculated from the following:   Height as of this encounter: 5\' 4"  (1.626 m).   Weight as of this encounter: 95.7 kg. Up with therapy D/C IV fluids  DVT Prophylaxis - Aspirin Weight-bearing as tolerated  Plan for discharge this afternoon once husband gets off work with HEP. Follow-up in the office in 2 weeks.   The PDMP database was reviewed today prior to any opioid medications being prescribed to this patient.  Theresa Duty,  PA-C Orthopedic Surgery [phone] 09/14/2020, 7:34 AM

## 2020-09-14 NOTE — Plan of Care (Signed)
Patient discharged home in stable condition 

## 2020-09-14 NOTE — Plan of Care (Signed)
  Problem: Education: Goal: Knowledge of General Education information will improve Description Including pain rating scale, medication(s)/side effects and non-pharmacologic comfort measures Outcome: Progressing   Problem: Health Behavior/Discharge Planning: Goal: Ability to manage health-related needs will improve Outcome: Progressing   

## 2020-09-14 NOTE — Progress Notes (Signed)
Physical Therapy Treatment Patient Details Name: Alyssa Montgomery MRN: [id number] DOB: 1961/04/24 Today's Date: 09/14/2020    History of Present Illness 60 y.o. female admitted 09/12/20 for L AA-THA. PMH: HTN, neuropathy, fibromyalgia, DM.    PT Comments    Pt is progressing well with mobility, she ambulated 125' with RW without loss of balance, completed stair training, and demonstrates good understanding of HEP. She is ready to DC home from PT standpoint.   Follow Up Recommendations  Follow surgeon's recommendation for DC plan and follow-up therapies     Equipment Recommendations  Rolling walker with 5" wheels;3in1 (PT)    Recommendations for Other Services       Precautions / Restrictions Precautions Precautions: Knee Precaution Booklet Issued: Yes (comment) Precaution Comments: reviewed no pillow under knee Restrictions Weight Bearing Restrictions: No Other Position/Activity Restrictions: WBAT    Mobility  Bed Mobility               General bed mobility comments: up in recliner    Transfers Overall transfer level: Needs assistance Equipment used: Rolling walker (2 wheeled) Transfers: Sit to/from Stand Sit to Stand: Supervision         General transfer comment: VCs safety (instructed pt to wait until she had RW in front of her to stand as she stood from recliner prior to having RW), pt also wanted to use gait belt looped around her foot to help advance LLE while walking -instructed pt to remove belt when in standing for safety  Ambulation/Gait Ambulation/Gait assistance: Supervision Gait Distance (Feet): 125 Feet Assistive device: Rolling walker (2 wheeled) Gait Pattern/deviations: Step-to pattern Gait velocity: decr   General Gait Details: distance limited by pain, no loss of balance   Stairs Stairs: Yes Stairs assistance: Min guard Stair Management: Two rails;Forwards;Step to pattern Number of Stairs: 6 General stair comments: 3 + 3 steps, VCs  sequencing, no loss of balance   Wheelchair Mobility    Modified Rankin (Stroke Patients Only)       Balance Overall balance assessment: Modified Independent                                          Cognition Arousal/Alertness: Awake/alert Behavior During Therapy: WFL for tasks assessed/performed Overall Cognitive Status: Within Functional Limits for tasks assessed                                        Exercises Total Joint Exercises Ankle Circles/Pumps: AROM;Both;10 reps;Supine Quad Sets: AROM;Both;5 reps;Supine Short Arc Quad: AROM;Left;Supine;10 reps Heel Slides: AAROM;Left;10 reps;Supine Hip ABduction/ADduction: AAROM;Left;10 reps;Supine Long Arc Quad: AROM;Left;Seated;10 reps    General Comments        Pertinent Vitals/Pain Pain Score: 8  Pain Location: L hip and thigh Pain Descriptors / Indicators: Sore Pain Intervention(s): Limited activity within patient's tolerance;Monitored during session;Premedicated before session;Ice applied    Home Living                      Prior Function            PT Goals (current goals can now be found in the care plan section) Acute Rehab PT Goals Patient Stated Goal: start a ministry to former addicts, write a book PT Goal Formulation: With patient Time For Goal Achievement: 09/20/20  Potential to Achieve Goals: Good Progress towards PT goals: Progressing toward goals    Frequency    7X/week      PT Plan Current plan remains appropriate    Co-evaluation              AM-PAC PT "6 Clicks" Mobility   Outcome Measure  Help needed turning from your back to your side while in a flat bed without using bedrails?: A Little Help needed moving from lying on your back to sitting on the side of a flat bed without using bedrails?: A Little Help needed moving to and from a bed to a chair (including a wheelchair)?: None Help needed standing up from a chair using your arms  (e.g., wheelchair or bedside chair)?: None Help needed to walk in hospital room?: None Help needed climbing 3-5 steps with a railing? : None 6 Click Score: 22    End of Session Equipment Utilized During Treatment: Gait belt Activity Tolerance: Patient limited by pain Patient left: with call bell/phone within reach;Other (comment) (on commode) Nurse Communication: Mobility status PT Visit Diagnosis: Difficulty in walking, not elsewhere classified (R26.2);Pain;Muscle weakness (generalized) (M62.81) Pain - Right/Left: Left Pain - part of body: Hip     Time: 3582-5189 PT Time Calculation (min) (ACUTE ONLY): 21 min  Charges:  $Gait Training: 8-22 mins                    Blondell Reveal Kistler PT 09/14/2020  Acute Rehabilitation Services Pager [phone] Office [phone]

## 2020-09-15 ENCOUNTER — Ambulatory Visit: Payer: Medicare Other | Admitting: Neurology

## 2020-09-20 ENCOUNTER — Encounter (HOSPITAL_COMMUNITY): Payer: Medicare Other

## 2020-09-21 NOTE — Discharge Summary (Signed)
Physician Discharge Summary   Patient ID: Alyssa Montgomery MRN: [id number] DOB/AGE: [birth year] 60 y.o.  Admit date: 09/12/2020 Discharge date: 09/14/2020  Primary Diagnosis: Osteoarthritis, left hip   Admission Diagnoses:  Past Medical History:  Diagnosis Date  . Anxiety   . Bilateral carpal tunnel syndrome 03/03/2018  . Bronchitis   . Cervical spondylosis without myelopathy 07/29/2018  . Cervicalgia 11/16/2014  . Cervicogenic headache 07/29/2018  . Chronic bilateral low back pain with bilateral sciatica 09/18/2018  . Claudication ([place]) 09/03/2016  . Diverticulitis   . FIBROIDS, UTERUS 03/09/2010   Qualifier: Diagnosis of  By: Carlean Purl MD, Dimas Millin Fibromyalgia   . Fibromyalgia 06/18/2014  . GERD (gastroesophageal reflux disease)   . Goiter 02/28/2013  . Headache   . History of cervical dysplasia 01/18/2016  . Hypertension associated with diabetes ([place]) 12/20/2019  . Left carpal tunnel syndrome 01/27/2018  . Pain in right ankle and joints of right foot 03/17/2019  . Plantar fasciitis   . Spinal stenosis of cervical region 07/29/2018  . Substance abuse ([place])    10 years ago ( cocaine)  . TIA (transient ischemic attack) 08/10/2016  . Tobacco abuse 01/18/2012  . Tobacco use disorder 04/17/2015   Discharge Diagnoses:   Principal Problem:   OA (osteoarthritis) of hip Active Problems:   Primary osteoarthritis of left hip  Estimated body mass index is 36.22 kg/m as calculated from the following:   Height as of this encounter: 5' 4"  (1.626 m).   Weight as of this encounter: 95.7 kg.  Procedure:  Procedure(s) (LRB): TOTAL HIP ARTHROPLASTY ANTERIOR APPROACH (Left)   Consults: None  HPI: Alyssa Montgomery is a 60 y.o. female who has advanced end-stage arthritis of their Left  hip with progressively worsening pain and dysfunction.The patient has failed nonoperative management and presents for total hip arthroplasty.   Laboratory Data: Admission on 09/12/2020, Discharged on 09/14/2020   Component Date Value Ref Range Status  . ABO/RH(D) 09/12/2020 A POS   Final  . Antibody Screen 09/12/2020 NEG   Final  . Sample Expiration 09/12/2020    Final                   Value:09/15/2020,2359 Performed at [facility], [place] [address], [place], [place] [address]   . MRSA, PCR 09/12/2020 POSITIVE* NEGATIVE Final   Comment: RESULT CALLED TO, READ BACK BY AND VERIFIED WITH: R.PATTERSON, RN AT 1627 ON 04.04.22 BY N.THOMPSON   . Staphylococcus aureus 09/12/2020 POSITIVE* NEGATIVE Final   Comment: (NOTE) The Xpert SA Assay (FDA approved for NASAL specimens in patients 3 years of age and older), is one component of a comprehensive surveillance program. It is not intended to diagnose infection nor to guide or monitor treatment. Performed at [facility], [place] [address] [place], [place] [address]   . WBC 09/13/2020 16.0* 4.0 - 10.5 K/uL Final  . RBC 09/13/2020 4.40  3.87 - 5.11 MIL/uL Final  . Hemoglobin 09/13/2020 11.7* 12.0 - 15.0 g/dL Final  . HCT 09/13/2020 37.0  36.0 - 46.0 % Final  . MCV 09/13/2020 84.1  80.0 - 100.0 fL Final  . MCH 09/13/2020 26.6  26.0 - 34.0 pg Final  . MCHC 09/13/2020 31.6  30.0 - 36.0 g/dL Final  . RDW 09/13/2020 14.4  11.5 - 15.5 % Final  . Platelets 09/13/2020 312  150 - 400 K/uL Final  . nRBC 09/13/2020 0.0  0.0 - 0.2 % Final   Performed  at [facility], [place] [address], [place], [place] [address]  . Sodium 09/13/2020 137  135 - 145 mmol/L Final  . Potassium 09/13/2020 4.0  3.5 - 5.1 mmol/L Final  . Chloride 09/13/2020 102  98 - 111 mmol/L Final  . CO2 09/13/2020 24  22 - 32 mmol/L Final  . Glucose, Bld 09/13/2020 174* 70 - 99 mg/dL Final   Glucose reference range applies only to samples taken after fasting for at least 8 hours.  . BUN 09/13/2020 11  8 - 23 mg/dL Final  . Creatinine, Ser 09/13/2020 0.87  0.44 - 1.00 mg/dL Final  . Calcium 09/13/2020 9.4  8.9 - 10.3 mg/dL Final  . GFR,  Estimated 09/13/2020 >60  >60 mL/min Final   Comment: (NOTE) Calculated using the CKD-EPI Creatinine Equation (2021)   . Anion gap 09/13/2020 11  5 - 15 Final   Performed at [facility], [place] [address] [place], [place] [address]  . ABO/RH(D) 09/13/2020    Final                   Value:A POS Performed at [facility], [place] [address] [place], [place] [address]   . WBC 09/14/2020 16.4* 4.0 - 10.5 K/uL Final  . RBC 09/14/2020 4.18  3.87 - 5.11 MIL/uL Final  . Hemoglobin 09/14/2020 11.2* 12.0 - 15.0 g/dL Final  . HCT 09/14/2020 35.7* 36.0 - 46.0 % Final  . MCV 09/14/2020 85.4  80.0 - 100.0 fL Final  . MCH 09/14/2020 26.8  26.0 - 34.0 pg Final  . MCHC 09/14/2020 31.4  30.0 - 36.0 g/dL Final  . RDW 09/14/2020 14.6  11.5 - 15.5 % Final  . Platelets 09/14/2020 281  150 - 400 K/uL Final  . nRBC 09/14/2020 0.0  0.0 - 0.2 % Final   Performed at [facility], [place] [address], [place], [place] [address]  . Sodium 09/14/2020 137  135 - 145 mmol/L Final  . Potassium 09/14/2020 3.8  3.5 - 5.1 mmol/L Final  . Chloride 09/14/2020 102  98 - 111 mmol/L Final  . CO2 09/14/2020 24  22 - 32 mmol/L Final  . Glucose, Bld 09/14/2020 188* 70 - 99 mg/dL Final   Glucose reference range applies only to samples taken after fasting for at least 8 hours.  . BUN 09/14/2020 16  8 - 23 mg/dL Final  . Creatinine, Ser 09/14/2020 0.71  0.44 - 1.00 mg/dL Final  . Calcium 09/14/2020 9.3  8.9 - 10.3 mg/dL Final  . GFR, Estimated 09/14/2020 >60  >60 mL/min Final   Comment: (NOTE) Calculated using the CKD-EPI Creatinine Equation (2021)   . Anion gap 09/14/2020 11  5 - 15 Final   Performed at [facility], [place] [address], [place], [place] [address]  Hospital Outpatient Visit on 09/08/2020  Component Date Value Ref Range Status  . SARS Coronavirus 2 09/08/2020 NEGATIVE  NEGATIVE Final   Comment: (NOTE) SARS-CoV-2 target nucleic acids are NOT  DETECTED.  The SARS-CoV-2 RNA is generally detectable in upper and lower respiratory specimens during the acute phase of infection. Negative results do not preclude SARS-CoV-2 infection, do not rule out co-infections with other pathogens, and should not be used as the sole basis for treatment or other patient management decisions. Negative results must be combined with clinical observations, patient history, and epidemiological information. The expected result is Negative.  Fact Sheet for Patients: SugarRoll.be  Fact Sheet for Healthcare Providers:  https://www.woods-mathews.com/  This test is not yet approved or cleared by the [place] and  has been authorized for detection and/or diagnosis of SARS-CoV-2 by FDA under an Emergency Use Authorization (EUA). This EUA will remain  in effect (meaning this test can be used) for the duration of the COVID-19 declaration under Se                          ction 564(b)(1) of the Act, 21 U.S.C. section 360bbb-3(b)(1), unless the authorization is terminated or revoked sooner.  Performed at [facility], [place] [address], [place], [place] [address]   Office Visit on 08/11/2020  Component Date Value Ref Range Status  . Color, UA 08/11/2020 yellow  yellow Final  . Clarity, UA 08/11/2020 cloudy* clear Final  . Glucose, UA 08/11/2020 negative  negative mg/dL Final  . Bilirubin, UA 08/11/2020 negative  negative Final  . Ketones, POC UA 08/11/2020 negative  negative mg/dL Final  . Spec Grav, UA 08/11/2020 >=1.030* 1.010 - 1.025 Final  . Blood, UA 08/11/2020 moderate* negative Final  . pH, UA 08/11/2020 5.5  5.0 - 8.0 Final  . Protein Ur, POC 08/11/2020 =30* negative mg/dL Final  . Urobilinogen, UA 08/11/2020 0.2  0.2 or 1.0 E.U./dL Final  . Nitrite, UA 08/11/2020 Negative  Negative Final  . Leukocytes, UA 08/11/2020 Small (1+)* Negative Final  . WBC, Ur, HPF, POC 08/11/2020 5-10  0 - 5 Final   . RBC, Urine, Miroscopic 08/11/2020 1-3  0 - 2 Final  . Bacteria, U Microscopic 08/11/2020 MODERATE  None - Trace Final  . Epithelial cells, urine per micros 08/11/2020 0-3   Final  . Urine Culture, Routine 08/11/2020 Final report*  Final  . Organism ID, Bacteria 08/11/2020 Escherichia coli*  Final   Comment: Cefazolin <=4 ug/mL Cefazolin with an MIC <=16 predicts susceptibility to the oral agents cefaclor, cefdinir, cefpodoxime, cefprozil, cefuroxime, cephalexin, and loracarbef when used for therapy of uncomplicated urinary tract infections due to E. coli, Klebsiella pneumoniae, and Proteus mirabilis. Greater than 100,000 colony forming units per mL   . Antimicrobial Susceptibility 08/11/2020 Comment   Final   Comment:       ** S = Susceptible; I = Intermediate; R = Resistant **                    P = Positive; N = Negative             MICS are expressed in micrograms per mL    Antibiotic                 RSLT#1    RSLT#2    RSLT#3    RSLT#4 Amoxicillin/Clavulanic Acid    S Ampicillin                     S Cefepime                       S Ceftriaxone                    S Cefuroxime                     S Ciprofloxacin                  S Ertapenem  S Gentamicin                     S Imipenem                       S Levofloxacin                   S Meropenem                      S Nitrofurantoin                 S Piperacillin/Tazobactam        S Tetracycline                   S Tobramycin                     S Trimethoprim/Sulfa             S   . WBC 08/11/2020 6.2  3.4 - 10.8 x10E3/uL Final  . RBC 08/11/2020 4.54  3.77 - 5.28 x10E6/uL Final  . Hemoglobin 08/11/2020 12.1  11.1 - 15.9 g/dL Final  . Hematocrit 08/11/2020 37.3  34.0 - 46.6 % Final  . MCV 08/11/2020 82  79 - 97 fL Final  . MCH 08/11/2020 26.7  26.6 - 33.0 pg Final  . MCHC 08/11/2020 32.4  31.5 - 35.7 g/dL Final  . RDW 08/11/2020 14.2  11.7 - 15.4 % Final  . Platelets 08/11/2020 274  150 - 450  x10E3/uL Final  . Glucose 08/11/2020 94  65 - 99 mg/dL Final  . BUN 08/11/2020 17  8 - 27 mg/dL Final  . Creatinine, Ser 08/11/2020 1.00  0.57 - 1.00 mg/dL Final  . eGFR 08/11/2020 64  >59 mL/min/1.73 Final   Comment: **In accordance with recommendations from the NKF-ASN Task force,**   [facility] has updated its eGFR calculation to the 2021 CKD-EPI   creatinine equation that estimates kidney function without a race   variable.   . BUN/Creatinine Ratio 08/11/2020 17  12 - 28 Final  . Sodium 08/11/2020 143  134 - 144 mmol/L Final  . Potassium 08/11/2020 3.9  3.5 - 5.2 mmol/L Final  . Chloride 08/11/2020 105  96 - 106 mmol/L Final  . CO2 08/11/2020 19* 20 - 29 mmol/L Final  . Calcium 08/11/2020 9.4  8.7 - 10.3 mg/dL Final  . Total Protein 08/11/2020 7.1  6.0 - 8.5 g/dL Final  . Albumin 08/11/2020 4.4  3.8 - 4.9 g/dL Final  . Globulin, Total 08/11/2020 2.7  1.5 - 4.5 g/dL Final  . Albumin/Globulin Ratio 08/11/2020 1.6  1.2 - 2.2 Final  . Bilirubin Total 08/11/2020 <0.2  0.0 - 1.2 mg/dL Final  . Alkaline Phosphatase 08/11/2020 113  44 - 121 IU/L Final  . AST 08/11/2020 16  0 - 40 IU/L Final  . ALT 08/11/2020 14  0 - 32 IU/L Final  . INR 08/11/2020 1.0  0.9 - 1.2 Final   Comment: Reference interval is for non-anticoagulated patients. Suggested INR therapeutic range for Vitamin K antagonist therapy:    Standard Dose (moderate intensity                   therapeutic range):       2.0 - 3.0    Higher intensity therapeutic range       2.5 - 3.5   . Prothrombin Time 08/11/2020 10.6  9.1 - 12.0 sec Final  . Hemoglobin A1C 08/11/2020 6.0* 4.0 - 5.6 % Final     X-Rays:DG Pelvis Portable  Result Date: 09/12/2020 CLINICAL DATA:  Left hip arthroplasty EXAM: PORTABLE PELVIS 1-2 VIEWS COMPARISON:  11/27/2019 FINDINGS: Left hip replacement in satisfactory position alignment. No fracture or complication. IMPRESSION: Satisfactory left hip replacement. Electronically Signed   By: Franchot Gallo M.D.    On: 09/12/2020 16:34   DG C-Arm 1-60 Min-No Report  Result Date: 09/12/2020 Fluoroscopy was utilized by the requesting physician.  No radiographic interpretation.   DG HIP OPERATIVE UNILAT W OR W/O PELVIS LEFT  Result Date: 09/12/2020 CLINICAL DATA:  Operative left hip. EXAM: OPERATIVE LEFT HIP (WITH PELVIS IF PERFORMED) 6 VIEWS TECHNIQUE: Fluoroscopic spot image(s) were submitted for interpretation post-operatively. COMPARISON:  November 27, 2019. FINDINGS: Fluoro time: 8 seconds Six C-arm fluoroscopic images were obtained intraoperatively and submitted for post operative interpretation. These images demonstrate changes associated with left total hip arthroplasty. Final images demonstrate no unexpected findings. Partially imaged moderate degenerative change of the right hip. Please see the performing provider's procedural report for further detail. IMPRESSION: Left total hip arthroplasty. Electronically Signed   By: Margaretha Sheffield MD   On: 09/12/2020 16:56    EKG: Orders placed or performed during the hospital encounter of 09/12/20  . EKG 12 lead per protocol  . EKG 12 lead per protocol     Hospital Course: DARSHAY DEUPREE is a 60 y.o. who was admitted to [facility]. They were brought to the operating room on 09/12/2020 and underwent Procedure(s): [place].  Patient tolerated the procedure well and was later transferred to the recovery room and then to the orthopaedic floor for postoperative care. They were given PO and IV analgesics for pain control following their surgery. They were given 24 hours of postoperative antibiotics of  Anti-infectives (From admission, onward)   Start     Dose/Rate Route Frequency Ordered Stop   09/12/20 2000  ceFAZolin (ANCEF) IVPB 2g/100 mL premix        2 g 200 mL/hr over 30 Minutes Intravenous Every 6 hours 09/12/20 1730 09/13/20 0137   09/12/20 1245  ceFAZolin (ANCEF) IVPB 2g/100 mL premix        2 g 200 mL/hr over 30  Minutes Intravenous On call to O.R. 09/12/20 1238 09/12/20 1400     and started on DVT prophylaxis in the form of Aspirin.   PT and OT were ordered for total joint protocol. Discharge planning consulted to help with postop disposition and equipment needs. Patient had a good night on the evening of surgery. They started to get up OOB with therapy on POD #1. Continued to work with therapy into POD #2. Pt was seen during rounds on day two and was ready to go home pending progress with therapy. Dressing was changed and the incision was clean, dry, and intact. Pt worked with therapy for two additional sessions and was meeting their goals. She was discharged to home later that day in stable condition.  Diet: Regular diet Activity: WBAT Follow-up: in 2 weeks Disposition: Home with HEP Discharged Condition: stable   Discharge Instructions    Call MD / Call 911   Complete by: As directed    If you experience chest pain or shortness of breath, CALL 911 and be transported to the hospital emergency room.  If you develope a fever above 101 F, pus (white drainage) or increased drainage or redness at  the wound, or calf pain, call your surgeon's office.   Change dressing   Complete by: As directed    You have an adhesive waterproof bandage over the incision. Leave this in place until your first follow-up appointment. Once you remove this you will not need to place another bandage.   Constipation Prevention   Complete by: As directed    Drink plenty of fluids.  Prune juice may be helpful.  You may use a stool softener, such as Colace (over the counter) 100 mg twice a day.  Use MiraLax (over the counter) for constipation as needed.   Diet - low sodium heart healthy   Complete by: As directed    Do not sit on low chairs, stoools or toilet seats, as it may be difficult to get up from low surfaces   Complete by: As directed    Driving restrictions   Complete by: As directed    No driving for two weeks   TED  hose   Complete by: As directed    Use stockings (TED hose) for three weeks on both leg(s).  You may remove them at night for sleeping.   Weight bearing as tolerated   Complete by: As directed      Allergies as of 09/14/2020      Reactions   Zanaflex [tizanidine Hcl] Other (See Comments)   Syncope   Penicillins Other (See Comments)   Unknown childhood reaction. Has patient had a PCN reaction causing immediate rash, facial/tongue/throat swelling, SOB or lightheadedness with hypotension: Unknown Has patient had a PCN reaction causing severe rash involving mucus membranes or skin necrosis: Unknown Has patient had a PCN reaction that required hospitalization: Unknown Has patient had a PCN reaction occurring within the last 10 years: No Tolerated Cephalosporin Date: 09/14/20.   Sulfonamide Derivatives Itching      Medication List    STOP taking these medications   losartan 25 MG tablet Commonly known as: COZAAR   naproxen 500 MG tablet Commonly known as: NAPROSYN     TAKE these medications   acetaminophen 500 MG tablet Commonly known as: TYLENOL Take 1,000 mg by mouth every 6 (six) hours as needed for mild pain or moderate pain.   albuterol 108 (90 Base) MCG/ACT inhaler Commonly known as: VENTOLIN HFA Inhale 2 puffs into the lungs every 6 (six) hours as needed for wheezing or shortness of breath.   aspirin 325 MG EC tablet Take 1 tablet (325 mg total) by mouth 2 (two) times daily for 19 days. Then take one 81 mg aspirin once a day for three weeks. Then discontinue aspirin.   bismuth subsalicylate 295 JO/84ZY suspension Commonly known as: PEPTO BISMOL Take 30 mLs by mouth every 6 (six) hours as needed for indigestion.   Calcium Carbonate-Simethicone 750-80 MG Chew Chew 1 tablet by mouth 2 (two) times daily as needed (heartburn).   cyclobenzaprine 10 MG tablet Commonly known as: FLEXERIL Take 10 mg by mouth daily as needed.   diclofenac Sodium 1 % Gel Commonly known as:  VOLTAREN Apply 2 g topically daily as needed (pain).   ECHINACEA PO Take 750 mg by mouth daily.   ELDERBERRY PO Take 1 tablet by mouth daily. Gummie   fluticasone 50 MCG/ACT nasal spray Commonly known as: FLONASE Place 1 spray into both nostrils daily. 1 spray in each nostril every day What changed:   when to take this  reasons to take this  additional instructions   gabapentin 100 MG capsule Commonly  known as: NEURONTIN 100 mg at night for the first 2 nights, 200 mg at night for night 3 through 4, 300 mg at night after that. What changed:   how much to take  how to take this  when to take this  additional instructions   Icy Hot 5 % Ptch Generic drug: Menthol Apply 1 application topically daily as needed (Pain).   lidocaine 5 % Commonly known as: Lidoderm Place 1 patch onto the skin daily. Remove & Discard patch within 12 hours or as directed by MD   magnesium gluconate 500 MG tablet Commonly known as: MAGONATE Take 500 mg by mouth daily.   metFORMIN 500 MG 24 hr tablet Commonly known as: GLUCOPHAGE-XR Take 2 tablets (1,000 mg total) by mouth daily with breakfast. What changed:   how much to take  when to take this   methocarbamol 500 MG tablet Commonly known as: ROBAXIN Take 1 tablet (500 mg total) by mouth every 6 (six) hours as needed for muscle spasms.   Omega-3 1000 MG Caps Take 1,000 mg by mouth daily.   oxyCODONE 5 MG immediate release tablet Commonly known as: Oxy IR/ROXICODONE Take 1-2 tablets (5-10 mg total) by mouth every 6 (six) hours as needed for severe pain.   pravastatin 20 MG tablet Commonly known as: PRAVACHOL Take 1 tablet (20 mg total) by mouth daily. What changed: when to take this   simethicone 80 MG chewable tablet Commonly known as: MYLICON Chew 80 mg by mouth every 6 (six) hours as needed for flatulence.   traMADol 50 MG tablet Commonly known as: ULTRAM Take 1-2 tablets (50-100 mg total) by mouth every 6 (six) hours  as needed for moderate pain. What changed:   how much to take  reasons to take this   Vitamin B-12 5000 MCG Tbdp Take 5,000 mcg by mouth daily.   vitamin C 500 MG tablet Commonly known as: ASCORBIC ACID Take 500 mg by mouth daily.   Vitamin D-3 125 MCG (5000 UT) Tabs Take 5,000 Units by mouth daily.            Discharge Care Instructions  (From admission, onward)         Start     Ordered   09/14/20 0000  Weight bearing as tolerated        09/14/20 0740   09/14/20 0000  Change dressing       Comments: You have an adhesive waterproof bandage over the incision. Leave this in place until your first follow-up appointment. Once you remove this you will not need to place another bandage.   09/14/20 0740          Follow-up Information    Gaynelle Arabian, MD. Go on 09/27/2020.   Specialty: Orthopedic Surgery Why: You are scheduled for first post op appointment on Tuesday April 19th at 5:15pm. Contact information: [address] [place] [address] [phone]               Signed: Theresa Duty, PA-C Orthopedic Surgery 09/21/2020, 1:53 PM

## 2020-10-12 ENCOUNTER — Other Ambulatory Visit: Payer: Self-pay

## 2020-10-12 ENCOUNTER — Ambulatory Visit (INDEPENDENT_AMBULATORY_CARE_PROVIDER_SITE_OTHER): Payer: Medicare Other | Admitting: Family Medicine

## 2020-10-12 ENCOUNTER — Encounter: Payer: Self-pay | Admitting: Family Medicine

## 2020-10-12 VITALS — BP 132/84 | HR 72 | Ht 64.0 in | Wt 218.2 lb

## 2020-10-12 DIAGNOSIS — Z0000 Encounter for general adult medical examination without abnormal findings: Secondary | ICD-10-CM | POA: Diagnosis not present

## 2020-10-12 DIAGNOSIS — E785 Hyperlipidemia, unspecified: Secondary | ICD-10-CM

## 2020-10-12 DIAGNOSIS — R350 Frequency of micturition: Secondary | ICD-10-CM

## 2020-10-12 DIAGNOSIS — N3001 Acute cystitis with hematuria: Secondary | ICD-10-CM

## 2020-10-12 DIAGNOSIS — E119 Type 2 diabetes mellitus without complications: Secondary | ICD-10-CM | POA: Diagnosis not present

## 2020-10-12 LAB — POCT URINALYSIS DIP (MANUAL ENTRY)
Bilirubin, UA: NEGATIVE
Blood, UA: NEGATIVE
Glucose, UA: NEGATIVE mg/dL
Leukocytes, UA: NEGATIVE
Nitrite, UA: NEGATIVE
Protein Ur, POC: NEGATIVE mg/dL
Spec Grav, UA: >=1.03 — AB (ref 1.010–1.025)
Urobilinogen, UA: 0.2 E.U./dL
pH, UA: 5.5 (ref 5.0–8.0)

## 2020-10-12 NOTE — Patient Instructions (Signed)
It was nice to see you today,  We did the following things today: - We will get an lipid panel to check your cholesterol levels - Please keep taking the pravastatin - I will put in a referral for the mammogram. - I will put in a referral for the colonoscopy. - Your urinalysis did not show signs of urinary tract infection.  The antibiotic you are taking will likely cover any bacteria in your urine as well.  I would like to see you back in approximately 1 month.  I will no longer be here after June 30.  A great day,  Clemetine Marker, MD

## 2020-10-12 NOTE — Progress Notes (Signed)
    SUBJECTIVE:   CHIEF COMPLAINT / HPI:   DM: The only occasionally takes her metformin due to gastrointestinal intolerance.  She also does not want to take any injectable medications for diabetes.  UTI: Patient thinks he still may have UTI.  Wants to be tested today.  Is currently taking an antibiotic prescribed by her orthopedist first possible skin infection.  She does not member the name.  HLD: Patient has started back taking her pravastatin.  Not having any cramps so far.  Patient wants to get a colonoscopy.  States she has not had it in over 10 years.  Patient also wants mammography.  She has not had one in 2 years.  Patient would like a referral to the cardiologist because many of her friends have recently had heart issues and she is worried she might have undiagnosed heart disease.  We discussed that optimizing her blood pressure, diabetes, cholesterol, and avoiding smoking (patient recently quit) for which she needs to do for prevention and that she does not have any findings currently that would need referral to a specialist.  PERTINENT  PMH / PSH: DM, hip replacement, HLD  OBJECTIVE:   BP 132/84   Pulse 72   Ht 5\' 4"  (1.626 m)   Wt 218 lb 3.2 oz (99 kg)   SpO2 97%   BMI 37.45 kg/m   : Alert and oriented.  No acute distress. CV: Regular rate and rhythm, no murmurs Pulmonary: Lungs are auscultation bilaterally  ASSESSMENT/PLAN:   Diabetes mellitus without complication (HCC) Z6X due in a month.  Currently only sporadically taking metformin, so essentially diet controlled.  Last A1c was 6.0.  We will get A1c at 1 month.  Encourage patient to limit and monitor carbohydrate intake.  Hyperlipidemia Restarting pravastatin.  Tolerating so far.  If not tolerating, consider switch to rosuvastatin.  If not tolerating rosuvastatin, Zetia would be next choice.  LDL today.  UTI (urinary tract infection) Recent history of UTI, treated with Keflex.  Currently taking an unknown  antibiotic for possible skin infection.  No signs of UTI on dipstick today.  We will continue to monitor.  Healthcare maintenance We will send in colonoscopy referral, and order mammogram.     Benay Pike, MD [place]

## 2020-10-13 LAB — LIPID PANEL
Chol/HDL Ratio: 2.7 ratio (ref 0.0–4.4)
Cholesterol, Total: 180 mg/dL (ref 100–199)
HDL: 66 mg/dL (ref >39)
LDL Chol Calc (NIH): 92 mg/dL (ref 0–99)
Triglycerides: 126 mg/dL (ref 0–149)
VLDL Cholesterol Cal: 22 mg/dL (ref 5–40)

## 2020-10-13 NOTE — Assessment & Plan Note (Signed)
We will send in colonoscopy referral, and order mammogram.

## 2020-10-13 NOTE — Assessment & Plan Note (Signed)
Restarting pravastatin.  Tolerating so far.  If not tolerating, consider switch to rosuvastatin.  If not tolerating rosuvastatin, Zetia would be next choice.  LDL today.

## 2020-10-13 NOTE — Assessment & Plan Note (Signed)
A1c due in a month.  Currently only sporadically taking metformin, so essentially diet controlled.  Last A1c was 6.0.  We will get A1c at 1 month.  Encourage patient to limit and monitor carbohydrate intake.

## 2020-10-13 NOTE — Assessment & Plan Note (Signed)
Recent history of UTI, treated with Keflex.  Currently taking an unknown antibiotic for possible skin infection.  No signs of UTI on dipstick today.  We will continue to monitor.

## 2020-10-18 DIAGNOSIS — Z96642 Presence of left artificial hip joint: Secondary | ICD-10-CM | POA: Diagnosis not present

## 2020-10-18 DIAGNOSIS — Z471 Aftercare following joint replacement surgery: Secondary | ICD-10-CM | POA: Diagnosis not present

## 2020-10-28 ENCOUNTER — Telehealth: Payer: Self-pay | Admitting: Neurology

## 2020-10-28 NOTE — Telephone Encounter (Signed)
I received a phone call from answering service and I returned the patient's call.  She is not yet the patient that [facility] but has an appointment on June 6.  She has been evaluated by [facility] for right leg pain and numbness and had an EMG there apparently which diagnosed neuropathy.  Her primary care physician had prescribed some gabapentin which was not helping.  I explained to the patient that I am unable to prescribe anything since she is noted to patient of practice.  I advised her to call back her primary care physician and see if he can either increase the dose or change to an alternative medication.  She voiced understanding

## 2020-11-01 DIAGNOSIS — I10 Essential (primary) hypertension: Secondary | ICD-10-CM | POA: Diagnosis not present

## 2020-11-01 DIAGNOSIS — R300 Dysuria: Secondary | ICD-10-CM | POA: Diagnosis not present

## 2020-11-01 DIAGNOSIS — Z1211 Encounter for screening for malignant neoplasm of colon: Secondary | ICD-10-CM | POA: Diagnosis not present

## 2020-11-19 DIAGNOSIS — Z135 Encounter for screening for eye and ear disorders: Secondary | ICD-10-CM | POA: Diagnosis not present

## 2020-11-22 ENCOUNTER — Other Ambulatory Visit: Payer: Self-pay

## 2020-11-22 ENCOUNTER — Encounter: Payer: Self-pay | Admitting: Neurology

## 2020-11-22 ENCOUNTER — Ambulatory Visit (INDEPENDENT_AMBULATORY_CARE_PROVIDER_SITE_OTHER): Payer: Medicare Other | Admitting: Neurology

## 2020-11-22 VITALS — BP 124/75 | HR 69 | Ht 64.0 in | Wt 216.5 lb

## 2020-11-22 DIAGNOSIS — M542 Cervicalgia: Secondary | ICD-10-CM | POA: Diagnosis not present

## 2020-11-22 DIAGNOSIS — R200 Anesthesia of skin: Secondary | ICD-10-CM | POA: Insufficient documentation

## 2020-11-22 DIAGNOSIS — R269 Unspecified abnormalities of gait and mobility: Secondary | ICD-10-CM

## 2020-11-22 HISTORY — DX: Unspecified abnormalities of gait and mobility: R26.9

## 2020-11-22 HISTORY — DX: Cervicalgia: M54.2

## 2020-11-22 HISTORY — DX: Anesthesia of skin: R20.0

## 2020-11-22 MED ORDER — GABAPENTIN 300 MG PO CAPS: 300.0000 mg | ORAL_CAPSULE | Freq: Three times a day (TID) | ORAL | 11 refills | Status: DC
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
## 2020-11-26 ENCOUNTER — Ambulatory Visit (HOSPITAL_COMMUNITY)
Admission: EM | Admit: 2020-11-26 | Discharge: 2020-11-26 | Disposition: A | Discharge status: Home / Self Care | Payer: Medicare Other | Attending: Family Medicine | Admitting: Family Medicine

## 2020-11-26 ENCOUNTER — Encounter (HOSPITAL_COMMUNITY): Payer: Self-pay

## 2020-11-26 DIAGNOSIS — Z880 Allergy status to penicillin: Secondary | ICD-10-CM | POA: Insufficient documentation

## 2020-11-26 DIAGNOSIS — J069 Acute upper respiratory infection, unspecified: Secondary | ICD-10-CM | POA: Diagnosis not present

## 2020-11-26 DIAGNOSIS — Z882 Allergy status to sulfonamides status: Secondary | ICD-10-CM | POA: Diagnosis not present

## 2020-11-26 DIAGNOSIS — U071 COVID-19: Secondary | ICD-10-CM | POA: Insufficient documentation

## 2020-11-26 DIAGNOSIS — R300 Dysuria: Secondary | ICD-10-CM | POA: Insufficient documentation

## 2020-11-26 DIAGNOSIS — Z87891 Personal history of nicotine dependence: Secondary | ICD-10-CM | POA: Diagnosis not present

## 2020-11-26 DIAGNOSIS — R059 Cough, unspecified: Secondary | ICD-10-CM | POA: Diagnosis not present

## 2020-11-26 DIAGNOSIS — Z881 Allergy status to other antibiotic agents status: Secondary | ICD-10-CM | POA: Insufficient documentation

## 2020-11-26 LAB — POCT URINALYSIS DIPSTICK, ED / UC
Bilirubin Urine: NEGATIVE
Glucose, UA: NEGATIVE mg/dL
Hgb urine dipstick: NEGATIVE
Ketones, ur: NEGATIVE mg/dL
Nitrite: NEGATIVE
Protein, ur: NEGATIVE mg/dL
Specific Gravity, Urine: >=1.03 (ref 1.005–1.030)
Urobilinogen, UA: 0.2 mg/dL (ref 0.0–1.0)
pH: 5.5 (ref 5.0–8.0)

## 2020-11-26 MED ORDER — METHYLPREDNISOLONE SODIUM SUCC 125 MG IJ SOLR
INTRAMUSCULAR | Status: AC
  Filled 2020-11-26: qty 2

## 2020-11-26 MED ORDER — METHYLPREDNISOLONE SODIUM SUCC 125 MG IJ SOLR
125.0000 mg | Freq: Once | INTRAMUSCULAR | Status: AC
  Administered 2020-11-26: 125 mg via INTRAMUSCULAR

## 2020-11-26 NOTE — ED Triage Notes (Signed)
Pt in with c/o back pain and "not urinating like normal", productive cough w/ cp  Pt also c/o headaches, chest congestion and sinus pressure   Pt has not had any medication for sx   Pt states her back has been hurting

## 2020-11-26 NOTE — ED Provider Notes (Signed)
[facility]   [id number] 11/26/20 Arrival Time: 9892   CC: COVID symptoms  SUBJECTIVE: History from: patient.  Alyssa Montgomery is a 60 y.o. female who presents with cough, headache, wheezing, nasal congestion, fatigue, muscle aches, chills for the last 2 days . Denies sick exposure to COVID, flu or strep. Denies recent travel. Has negative history of Covid. Has completed Covid vaccines, no booster. Has completed flu vaccine. Has been using flonase. There are no aggravating or alleviating factors. Denies previous symptoms in the past. Denies fever,  sore throat, SOB, chest pain, nausea, changes in bowel or bladder habits.    ROS: As per HPI.  All other pertinent ROS negative.     Past Medical History:  Diagnosis Date   Anxiety    Bilateral carpal tunnel syndrome 03/03/2018   Bronchitis    Cervical spondylosis without myelopathy 07/29/2018   Cervicalgia 11/16/2014   Cervicogenic headache 07/29/2018   Chronic bilateral low back pain with bilateral sciatica 09/18/2018   Claudication ([place]) 09/03/2016   Diverticulitis    FIBROIDS, UTERUS 03/09/2010   Qualifier: Diagnosis of  By: Carlean Purl MD, Tonna Boehringer E    Fibromyalgia    Fibromyalgia 06/18/2014   GERD (gastroesophageal reflux disease)    Goiter 02/28/2013   Headache    History of cervical dysplasia 01/18/2016   Hypertension associated with diabetes ([place]) 12/20/2019   Left carpal tunnel syndrome 01/27/2018   Neuropathy    Pain in right ankle and joints of right foot 03/17/2019   Plantar fasciitis    Spinal stenosis of cervical region 07/29/2018   Substance abuse ([place])    10 years ago ( cocaine)   TIA (transient ischemic attack) 08/10/2016   Tobacco abuse 01/18/2012   Tobacco use disorder 04/17/2015   Past Surgical History:  Procedure Laterality Date   ANKLE SURGERY     BACK SURGERY     Bowel obstruction     CARPAL TUNNEL RELEASE     CERVICAL CONIZATION W/BX N/A 12/27/2015   Procedure: CONIZATION CERVIX WITH  BIOPSY;  Surgeon: Terrance Mass, MD;  Location: [facility];  Service: Gynecology;  Laterality: N/A;   DILATATION & CURETTAGE/HYSTEROSCOPY WITH MYOSURE N/A 12/27/2015   Procedure: DILATATION & CURETTAGE/HYSTEROSCOPY WITH MYOSURE;  Surgeon: Terrance Mass, MD;  Location: [facility];  Service: Gynecology;  Laterality: N/A;   ECTOPIC PREGNANCY SURGERY     EYE SURGERY     removed right eye;    EYE SURGERY     TOTAL HIP ARTHROPLASTY Left 09/12/2020   Procedure: TOTAL HIP ARTHROPLASTY ANTERIOR APPROACH;  Surgeon: Gaynelle Arabian, MD;  Location: [facility];  Service: Orthopedics;  Laterality: Left;  161min   Allergies  Allergen Reactions   Zanaflex [Tizanidine Hcl] Other (See Comments)    Syncope   Penicillins Other (See Comments)    Unknown childhood reaction. Has patient had a PCN reaction causing immediate rash, facial/tongue/throat swelling, SOB or lightheadedness with hypotension: Unknown Has patient had a PCN reaction causing severe rash involving mucus membranes or skin necrosis: Unknown Has patient had a PCN reaction that required hospitalization: Unknown Has patient had a PCN reaction occurring within the last 10 years: No  Tolerated Cephalosporin Date: 09/14/20.   Sulfonamide Derivatives Itching   No current facility-administered medications on file prior to encounter.   Current Outpatient Medications on File Prior to Encounter  Medication Sig Dispense Refill   metFORMIN (GLUCOPHAGE-XR) 500 MG 24 hr tablet Take 2 tablets (1,000 mg total) by mouth daily with breakfast. (Patient  taking differently: Take 500 mg by mouth 3 (three) times a week.) 180 tablet 3   acetaminophen (TYLENOL) 500 MG tablet Take 1,000 mg by mouth every 6 (six) hours as needed for mild pain or moderate pain.     albuterol (VENTOLIN HFA) 108 (90 Base) MCG/ACT inhaler Inhale 2 puffs into the lungs every 6 (six) hours as needed for wheezing or shortness of breath. 18 g 0   bismuth subsalicylate (PEPTO BISMOL) 262 MG/15ML  suspension Take 30 mLs by mouth every 6 (six) hours as needed for indigestion.     Calcium Carbonate-Simethicone 750-80 MG CHEW Chew 1 tablet by mouth 2 (two) times daily as needed (heartburn).     Cholecalciferol (VITAMIN D-3) 125 MCG (5000 UT) TABS Take 5,000 Units by mouth daily.     Cyanocobalamin (VITAMIN B-12) 5000 MCG TBDP Take 5,000 mcg by mouth daily.     cyclobenzaprine (FLEXERIL) 10 MG tablet Take 10 mg by mouth daily as needed.     diclofenac Sodium (VOLTAREN) 1 % GEL Apply 2 g topically daily as needed (pain).     ECHINACEA PO Take 750 mg by mouth daily.     ELDERBERRY PO Take 1 tablet by mouth daily. Gummie     fluticasone (FLONASE) 50 MCG/ACT nasal spray Place 1 spray into both nostrils daily. 1 spray in each nostril every day (Patient taking differently: Place 1 spray into both nostrils daily as needed for allergies or rhinitis.) 16 g 5   gabapentin (NEURONTIN) 300 MG capsule Take 1 capsule (300 mg total) by mouth 3 (three) times daily. 90 capsule 11   magnesium gluconate (MAGONATE) 500 MG tablet Take 500 mg by mouth daily.     Menthol (ICY HOT) 5 % PTCH Apply 1 application topically daily as needed (Pain).     pravastatin (PRAVACHOL) 20 MG tablet Take 1 tablet (20 mg total) by mouth daily. (Patient taking differently: Take 20 mg by mouth 3 (three) times a week.) 90 tablet 3   simethicone (MYLICON) 80 MG chewable tablet Chew 80 mg by mouth every 6 (six) hours as needed for flatulence.     vitamin C (ASCORBIC ACID) 500 MG tablet Take 500 mg by mouth daily.     Social History   Socioeconomic History   Marital status: Married    Spouse name: Not on file   Number of children: 2   Years of education: some college   Highest education level: Not on file  Occupational History   Occupation: CNA/med tech  Tobacco Use   Smoking status: Former    Packs/day: 0.50    Years: 18.00    Pack years: 9.00    Types: Cigarettes    Quit date: 04/27/2019    Years since quitting: 1.5    Smokeless tobacco: Never   Tobacco comments:    1-800 number given   Vaping Use   Vaping Use: Never used  Substance and Sexual Activity   Alcohol use: Yes    Alcohol/week: 0.0 standard drinks    Comment: Occasional   Drug use: No    Comment: 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  Other Topics Concern   Not on file  Social History Narrative   Lives with husband and mother (caregiver for her mother).   Right-handed.   One soda daily.   Social Determinants of Health   Financial Resource Strain: Not on file  Food Insecurity:  Not on file  Transportation Needs: Not on file  Physical Activity: Not on file  Stress: Not on file  Social Connections: Not on file  Intimate Partner Violence: Not on file   Family History  Problem Relation Age of Onset   Cancer Father        lung   Hypertension Mother    Arthritis Mother    Other Neg Hx        pituitary problem    OBJECTIVE:  Vitals:   11/26/20 1441  BP: 132/82  Pulse: 71  Resp: 18  Temp: 98.4 F (36.9 C)  TempSrc: Oral  SpO2: 97%     General appearance: alert; appears fatigued, but nontoxic; speaking in full sentences and tolerating own secretions HEENT: NCAT; Ears: EACs clear, TMs pearly gray; Eyes: PERRL.  EOM grossly intact. Sinuses: nontender; Nose: nares patent with clear rhinorrhea, Throat: oropharynx erythematous, cobblestoning present, tonsils non erythematous or enlarged, uvula midline  Neck: supple with LAD Lungs: unlabored respirations, symmetrical air entry; cough: mild; no respiratory distress; diffuse wheezing bilaterally Heart: regular rate and rhythm.  Radial pulses 2+ symmetrical bilaterally Skin: warm and dry Psychological: alert and cooperative; normal mood and affect  LABS:  Results for orders placed or performed during the hospital encounter of 11/26/20 (from the past 24 hour(s))  POC Urinalysis dipstick     Status:  Abnormal   Collection Time: 11/26/20  3:05 PM  Result Value Ref Range   Glucose, UA NEGATIVE NEGATIVE mg/dL   Bilirubin Urine NEGATIVE NEGATIVE   Ketones, ur NEGATIVE NEGATIVE mg/dL   Specific Gravity, Urine >=1.030 1.005 - 1.030   Hgb urine dipstick NEGATIVE NEGATIVE   pH 5.5 5.0 - 8.0   Protein, ur NEGATIVE NEGATIVE mg/dL   Urobilinogen, UA 0.2 0.0 - 1.0 mg/dL   Nitrite NEGATIVE NEGATIVE   Leukocytes,Ua TRACE (A) NEGATIVE     ASSESSMENT & PLAN:  1. Viral URI with cough   2. Dysuria     Meds ordered this encounter  Medications   methylPREDNISolone sodium succinate (SOLU-MEDROL) 125 mg/2 mL injection 125 mg    Solumedrol 125mg  IM given in office today UA not concerning for infection today Continue supportive care at home COVID and flu testing ordered.  It will take between 2-3 days for test results. Someone will contact you regarding abnormal results.   Patient should remain in quarantine until they have received Covid results.  If negative you may resume normal activities (go back to work/school) while practicing hand hygiene, social distance, and mask wearing.  If positive, patient should remain in quarantine for at least 5 days from symptom onset AND greater than 72 hours after symptoms resolution (absence of fever without the use of fever-reducing medication and improvement in respiratory symptoms), whichever is longer Get plenty of rest and push fluids Use OTC zyrtec for nasal congestion, runny nose, and/or sore throat Use OTC flonase for nasal congestion and runny nose Use medications daily for symptom relief Use OTC medications like ibuprofen or tylenol as needed fever or pain Call or go to the ED if you have any new or worsening symptoms such as fever, worsening cough, shortness of breath, chest tightness, chest pain, turning blue, changes in mental status.  Reviewed expectations re: course of current medical issues. Questions answered. Outlined signs and symptoms  indicating need for more acute intervention. Patient verbalized understanding. After Visit Summary given.          Faustino Congress, NP 11/26/20 1524

## 2020-11-26 NOTE — Discharge Instructions (Signed)
You have received a steroid injection in the office today  May use zyrtec D and flonase for symptomatic relief.  UA negative for infection today  Your COVID and Influenza tests are pending.  You should self quarantine until the test results are back.    Take Tylenol or ibuprofen as needed for fever or discomfort.  Rest and keep yourself hydrated.    Follow-up with your primary care provider if your symptoms are not improving.

## 2020-11-27 ENCOUNTER — Telehealth: Payer: Self-pay | Admitting: Family Medicine

## 2020-11-27 DIAGNOSIS — U071 COVID-19: Secondary | ICD-10-CM

## 2020-11-27 LAB — SARS CORONAVIRUS 2 (TAT 6-24 HRS): SARS Coronavirus 2: POSITIVE — AB

## 2020-11-27 MED ORDER — MOLNUPIRAVIR EUA 200MG CAPSULE
4.0000 | ORAL_CAPSULE | Freq: Two times a day (BID) | ORAL | 0 refills | Status: AC
Start: 2020-11-27 — End: 2020-12-02

## 2020-11-27 NOTE — Telephone Encounter (Signed)
Discussed positive Covid results with patient. Instructed on quarantine time. Discussed oral antivirals, will proceed with molnupiravir. Sent to pharmacy.

## 2020-11-27 NOTE — Telephone Encounter (Signed)
Attempted to call to inform of positive Covid results. No answer.

## 2020-12-03 ENCOUNTER — Other Ambulatory Visit: Payer: Medicare Other

## 2020-12-05 ENCOUNTER — Emergency Department (HOSPITAL_COMMUNITY): Payer: Medicare Other

## 2020-12-05 ENCOUNTER — Other Ambulatory Visit: Payer: Self-pay

## 2020-12-05 ENCOUNTER — Emergency Department (HOSPITAL_COMMUNITY)
Admission: EM | Admit: 2020-12-05 | Discharge: 2020-12-06 | Disposition: A | Discharge status: Home / Self Care | Payer: Medicare Other | Attending: Emergency Medicine | Admitting: Emergency Medicine

## 2020-12-05 ENCOUNTER — Encounter (HOSPITAL_COMMUNITY): Payer: Self-pay | Admitting: Emergency Medicine

## 2020-12-05 DIAGNOSIS — E785 Hyperlipidemia, unspecified: Secondary | ICD-10-CM | POA: Insufficient documentation

## 2020-12-05 DIAGNOSIS — Z96642 Presence of left artificial hip joint: Secondary | ICD-10-CM | POA: Diagnosis not present

## 2020-12-05 DIAGNOSIS — R0789 Other chest pain: Secondary | ICD-10-CM | POA: Diagnosis not present

## 2020-12-05 DIAGNOSIS — E1169 Type 2 diabetes mellitus with other specified complication: Secondary | ICD-10-CM | POA: Diagnosis not present

## 2020-12-05 DIAGNOSIS — R519 Headache, unspecified: Secondary | ICD-10-CM

## 2020-12-05 DIAGNOSIS — Z8616 Personal history of COVID-19: Secondary | ICD-10-CM | POA: Diagnosis not present

## 2020-12-05 DIAGNOSIS — Z87891 Personal history of nicotine dependence: Secondary | ICD-10-CM | POA: Diagnosis not present

## 2020-12-05 DIAGNOSIS — Z7984 Long term (current) use of oral hypoglycemic drugs: Secondary | ICD-10-CM | POA: Insufficient documentation

## 2020-12-05 LAB — CBC WITH DIFFERENTIAL/PLATELET
Abs Immature Granulocytes: 0.03 10*3/uL (ref 0.00–0.07)
Basophils Absolute: 0.1 10*3/uL (ref 0.0–0.1)
Basophils Relative: 1 %
Eosinophils Absolute: 0.1 10*3/uL (ref 0.0–0.5)
Eosinophils Relative: 1 %
HCT: 39.4 % (ref 36.0–46.0)
Hemoglobin: 11.6 g/dL — ABNORMAL LOW (ref 12.0–15.0)
Immature Granulocytes: 0 %
Lymphocytes Relative: 34 %
Lymphs Abs: 2.5 10*3/uL (ref 0.7–4.0)
MCH: 24.7 pg — ABNORMAL LOW (ref 26.0–34.0)
MCHC: 29.4 g/dL — ABNORMAL LOW (ref 30.0–36.0)
MCV: 84 fL (ref 80.0–100.0)
Monocytes Absolute: 0.7 10*3/uL (ref 0.1–1.0)
Monocytes Relative: 10 %
Neutro Abs: 4 10*3/uL (ref 1.7–7.7)
Neutrophils Relative %: 54 %
Platelets: 305 10*3/uL (ref 150–400)
RBC: 4.69 MIL/uL (ref 3.87–5.11)
RDW: 14.9 % (ref 11.5–15.5)
WBC: 7.4 10*3/uL (ref 4.0–10.5)
nRBC: 0 % (ref 0.0–0.2)

## 2020-12-05 LAB — COMPREHENSIVE METABOLIC PANEL
ALT: 18 U/L (ref 0–44)
AST: 17 U/L (ref 15–41)
Albumin: 3.2 g/dL — ABNORMAL LOW (ref 3.5–5.0)
Alkaline Phosphatase: 101 U/L (ref 38–126)
Anion gap: 7 (ref 5–15)
BUN: 16 mg/dL (ref 8–23)
CO2: 29 mmol/L (ref 22–32)
Calcium: 9.2 mg/dL (ref 8.9–10.3)
Chloride: 104 mmol/L (ref 98–111)
Creatinine, Ser: 0.86 mg/dL (ref 0.44–1.00)
GFR, Estimated: >60 mL/min (ref >60)
Glucose, Bld: 135 mg/dL — ABNORMAL HIGH (ref 70–99)
Potassium: 3.8 mmol/L (ref 3.5–5.1)
Sodium: 140 mmol/L (ref 135–145)
Total Bilirubin: 0.2 mg/dL — ABNORMAL LOW (ref 0.3–1.2)
Total Protein: 6.4 g/dL — ABNORMAL LOW (ref 6.5–8.1)

## 2020-12-05 MED ORDER — TRIAMCINOLONE ACETONIDE 55 MCG/ACT NA AERO: 2.0000 | INHALATION_SPRAY | Freq: Every day | NASAL | 0 refills | Status: DC

## 2020-12-05 MED ORDER — PROCHLORPERAZINE EDISYLATE 10 MG/2ML IJ SOLN
10.0000 mg | Freq: Once | INTRAMUSCULAR | Status: AC
  Administered 2020-12-05: 10 mg via INTRAVENOUS
  Filled 2020-12-05: qty 2

## 2020-12-05 MED ORDER — PSEUDOEPHEDRINE HCL ER 120 MG PO TB12: 120.0000 mg | ORAL_TABLET | Freq: Two times a day (BID) | ORAL | 0 refills | Status: DC

## 2020-12-05 MED ORDER — KETOROLAC TROMETHAMINE 30 MG/ML IJ SOLN
15.0000 mg | Freq: Once | INTRAMUSCULAR | Status: AC
  Administered 2020-12-05: 15 mg via INTRAVENOUS
  Filled 2020-12-05: qty 1

## 2020-12-05 MED ORDER — SODIUM CHLORIDE 0.9 % IV BOLUS
1000.0000 mL | Freq: Once | INTRAVENOUS | Status: AC
  Administered 2020-12-05: 1000 mL via INTRAVENOUS

## 2020-12-05 NOTE — ED Provider Notes (Signed)
[facility] EMERGENCY DEPARTMENT Provider Note   CSN: [id number] Arrival date & time: 12/05/20  1549     History Chief Complaint  Patient presents with   Headache    Alyssa Montgomery is a 60 y.o. female.  HPI Patient presents with headache and chest pressure.  Patient is multiple medical issues, including ongoing evaluation for what sounds to be neuropathy in her right leg.  Patient was also diagnosed with COVID less than 2 weeks ago.  She notes that those symptoms have improved, but following improvement she has developed a headache, anterior, diffuse, sore.  Headache is somewhat better without clear intervention, following arrival here with CT scan, and she has no new weakness in any extremity.  In addition the patient's headache she complains of chest tightness, present since COVID diagnosis last week. She has been taking all her medication as directed, but no additional medication for pain relief.    Past Medical History:  Diagnosis Date   Anxiety    Bilateral carpal tunnel syndrome 03/03/2018   Bronchitis    Cervical spondylosis without myelopathy 07/29/2018   Cervicalgia 11/16/2014   Cervicogenic headache 07/29/2018   Chronic bilateral low back pain with bilateral sciatica 09/18/2018   Claudication ([place]) 09/03/2016   Diverticulitis    FIBROIDS, UTERUS 03/09/2010   Qualifier: Diagnosis of  By: Carlean Purl MD, Tonna Boehringer E    Fibromyalgia    Fibromyalgia 06/18/2014   GERD (gastroesophageal reflux disease)    Goiter 02/28/2013   Headache    History of cervical dysplasia 01/18/2016   Hypertension associated with diabetes ([place]) 12/20/2019   Left carpal tunnel syndrome 01/27/2018   Neuropathy    Pain in right ankle and joints of right foot 03/17/2019   Plantar fasciitis    Spinal stenosis of cervical region 07/29/2018   Substance abuse ([place])    10 years ago ( cocaine)   TIA (transient ischemic attack) 08/10/2016   Tobacco abuse 01/18/2012   Tobacco use  disorder 04/17/2015    Patient Active Problem List   Diagnosis Date Noted   Gait abnormality 11/22/2020   Neck pain 11/22/2020   Right leg numbness 11/22/2020   OA (osteoarthritis) of hip 09/12/2020   Primary osteoarthritis of left hip 09/12/2020   Pre-operative clearance 08/12/2020   Neuropathy 02/15/2020   Bilateral primary osteoarthritis of hip 02/10/2020   Left foot pain 12/20/2019   Hypertension associated with diabetes ([place]) 12/20/2019   Urinary frequency 11/30/2019   Left hip pain 11/23/2019   Trochanteric bursitis of left hip 11/23/2019   Non-seasonal allergic rhinitis 07/13/2019   Hyperlipidemia 11/14/2017   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) 57/84/6962   Healthcare maintenance 04/17/2015   Complete tear of right rotator cuff 11/03/2014   UTI (urinary tract infection) 06/19/2012   GERD 12/26/2006    Past Surgical History:  Procedure Laterality Date   ANKLE SURGERY     BACK SURGERY     Bowel obstruction     CARPAL TUNNEL RELEASE     CERVICAL CONIZATION W/BX N/A 12/27/2015   Procedure: CONIZATION CERVIX WITH BIOPSY;  Surgeon: Terrance Mass, MD;  Location: [facility];  Service: Gynecology;  Laterality: N/A;   DILATATION & CURETTAGE/HYSTEROSCOPY WITH MYOSURE N/A 12/27/2015   Procedure: DILATATION & CURETTAGE/HYSTEROSCOPY WITH MYOSURE;  Surgeon: Terrance Mass, MD;  Location: [facility];  Service: Gynecology;  Laterality: N/A;   ECTOPIC PREGNANCY SURGERY  EYE SURGERY     removed right eye;    EYE SURGERY     TOTAL HIP ARTHROPLASTY Left 09/12/2020   Procedure: TOTAL HIP ARTHROPLASTY ANTERIOR APPROACH;  Surgeon: Gaynelle Arabian, MD;  Location: [facility];  Service: Orthopedics;  Laterality: Left;  161min     OB History     Gravida  6   Para  2   Term  2   Preterm      AB  4   Living  2      SAB  2   IAB  1   Ectopic  1   Multiple      Live Births               Family History  Problem Relation Age of Onset   Cancer Father        lung   Hypertension Mother    Arthritis Mother    Other Neg Hx        pituitary problem    Social History   Tobacco Use   Smoking status: Former    Packs/day: 0.50    Years: 18.00    Pack years: 9.00    Types: Cigarettes    Quit date: 04/27/2019    Years since quitting: 1.6   Smokeless tobacco: Never   Tobacco comments:    1-800 number given   Vaping Use   Vaping Use: Never used  Substance Use Topics   Alcohol use: Yes    Alcohol/week: 0.0 standard drinks    Comment: Occasional   Drug use: No    Comment: clean 10 years crack cocaine    Home Medications Prior to Admission medications   Medication Sig Start Date End Date Taking? Authorizing Provider  acetaminophen (TYLENOL) 500 MG tablet Take 1,000 mg by mouth every 6 (six) hours as needed for mild pain or moderate pain.    [provider]  albuterol (VENTOLIN HFA) 108 (90 Base) MCG/ACT inhaler Inhale 2 puffs into the lungs every 6 (six) hours as needed for wheezing or shortness of breath. 11/20/19   Wilber Oliphant, MD  bismuth subsalicylate (PEPTO BISMOL) 262 MG/15ML suspension Take 30 mLs by mouth every 6 (six) hours as needed for indigestion.    [provider]  Calcium Carbonate-Simethicone 750-80 MG CHEW Chew 1 tablet by mouth 2 (two) times daily as needed (heartburn).    [provider]  Cholecalciferol (VITAMIN D-3) 125 MCG (5000 UT) TABS Take 5,000 Units by mouth daily.    [provider]  Cyanocobalamin (VITAMIN B-12) 5000 MCG TBDP Take 5,000 mcg by mouth daily.    [provider]  cyclobenzaprine (FLEXERIL) 10 MG tablet Take 10 mg by mouth daily as needed. 08/21/20   [provider]  diclofenac Sodium (VOLTAREN) 1 % GEL Apply 2 g topically daily as needed (pain).    [provider]  ECHINACEA PO Take 750 mg by mouth daily.    [provider]  ELDERBERRY PO Take 1 tablet  by mouth daily. Gummie    [provider]  fluticasone (FLONASE) 50 MCG/ACT nasal spray Place 1 spray into both nostrils daily. 1 spray in each nostril every day Patient taking differently: Place 1 spray into both nostrils daily as needed for allergies or rhinitis. 07/13/19   Wilber Oliphant, MD  gabapentin (NEURONTIN) 300 MG capsule Take 1 capsule (300 mg total) by mouth 3 (three) times daily. 11/22/20   Marcial Pacas, MD  magnesium gluconate (MAGONATE)  500 MG tablet Take 500 mg by mouth daily.    [provider]  Menthol (ICY HOT) 5 % PTCH Apply 1 application topically daily as needed (Pain).    [provider]  metFORMIN (GLUCOPHAGE-XR) 500 MG 24 hr tablet Take 2 tablets (1,000 mg total) by mouth daily with breakfast. Patient taking differently: Take 500 mg by mouth 3 (three) times a week. 01/13/20 11/26/20  Wilber Oliphant, MD  pravastatin (PRAVACHOL) 20 MG tablet Take 1 tablet (20 mg total) by mouth daily. Patient taking differently: Take 20 mg by mouth 3 (three) times a week. 11/20/19   Wilber Oliphant, MD  simethicone (MYLICON) 80 MG chewable tablet Chew 80 mg by mouth every 6 (six) hours as needed for flatulence.    [provider]  vitamin C (ASCORBIC ACID) 500 MG tablet Take 500 mg by mouth daily.    [provider]    Allergies    Zanaflex [tizanidine hcl], Penicillins, and Sulfonamide derivatives  Review of Systems   Review of Systems  Constitutional:        Per HPI, otherwise negative  HENT:         Per HPI, otherwise negative  Respiratory:         Per HPI, otherwise negative  Cardiovascular:        Per HPI, otherwise negative  Gastrointestinal:  Negative for vomiting.  Endocrine:       Negative aside from HPI  Genitourinary:        Neg aside from HPI   Musculoskeletal:        Per HPI, otherwise negative  Skin: Negative.   Allergic/Immunologic: Negative for immunocompromised state.  Neurological:  Positive for headaches. Negative for  syncope.   Physical Exam Updated Vital Signs BP 140/77 (BP Location: Left Arm)   Pulse 70   Temp 99.1 F (37.3 C) (Oral)   Resp 17   SpO2 99%   Physical Exam Vitals and nursing note reviewed.  Constitutional:      General: She is not in acute distress.    Appearance: She is well-developed.  HENT:     Head: Normocephalic and atraumatic.  Eyes:     Conjunctiva/sclera: Conjunctivae normal.  Cardiovascular:     Rate and Rhythm: Normal rate and regular rhythm.  Pulmonary:     Effort: Pulmonary effort is normal. No respiratory distress.     Breath sounds: Normal breath sounds. No stridor.  Abdominal:     General: There is no distension.  Skin:    General: Skin is warm and dry.  Neurological:     Mental Status: She is alert and oriented to person, place, and time.     Cranial Nerves: No cranial nerve deficit, dysarthria or facial asymmetry.     Motor: No weakness.  Psychiatric:        Mood and Affect: Mood is not anxious.    ED Results / Procedures / Treatments   Labs (all labs ordered are listed, but only abnormal results are displayed) Labs Reviewed  COMPREHENSIVE METABOLIC PANEL - Abnormal; Notable for the following components:      Result Value   Glucose, Bld 135 (*)    Total Protein 6.4 (*)    Albumin 3.2 (*)    Total Bilirubin 0.2 (*)    All other components within normal limits  CBC WITH DIFFERENTIAL/PLATELET - Abnormal; Notable for the following components:   Hemoglobin 11.6 (*)    MCH 24.7 (*)    MCHC  29.4 (*)    All other components within normal limits     Radiology CT Head Wo Contrast  Result Date: 12/05/2020 CLINICAL DATA:  Headache.  New or worsening. EXAM: CT HEAD WITHOUT CONTRAST TECHNIQUE: Contiguous axial images were obtained from the base of the skull through the vertex without intravenous contrast. COMPARISON:  CT head December 14, 2017. FINDINGS: Brain: No evidence of acute large vascular territory infarction, hemorrhage, hydrocephalus, extra-axial  collection or mass lesion/mass effect. Vascular: No hyperdense vessel identified. Skull: No acute fracture. Sinuses/Orbits: Visualized sinuses are clear. Right globe prosthesis. No acute orbital findings. Other: No mastoid effusions. IMPRESSION: No evidence of acute intracranial abnormality. Electronically Signed   By: Margaretha Sheffield MD   On: 12/05/2020 18:11   DG Chest Port 1 View  Result Date: 12/05/2020 CLINICAL DATA:  Chest pressure EXAM: PORTABLE CHEST 1 VIEW COMPARISON:  12/27/2017 FINDINGS: The heart size and mediastinal contours are within normal limits. Both lungs are clear. The visualized skeletal structures are unremarkable. IMPRESSION: No active disease. Electronically Signed   By: Donavan Foil M.D.   On: 12/05/2020 21:22    Procedures Procedures   Medications Ordered in ED Medications  sodium chloride 0.9 % bolus 1,000 mL (has no administration in time range)  ketorolac (TORADOL) 30 MG/ML injection 15 mg (has no administration in time range)  prochlorperazine (COMPAZINE) injection 10 mg (has no administration in time range)    ED Course  I have reviewed the triage vital signs and the nursing notes.  Pertinent labs & imaging results that were available during my care of the patient were reviewed by me and considered in my medical decision making (see chart for details).   11:31 PM Patient has received fluids, Toradol, Compazine, feels substantially better and sitting upright, states that she is ready to go home.  Have reviewed her CT head, labs, findings reassuring, no evidence for acute intracranial pathology, no evidence for bacteremia, sepsis, suspicion for post COVID headache and symptoms, possibly complicated by sinusitis. However, no evidence for deep space infection or other complicating factor.  Given her substantial improvement here she is discharged in stable condition with appropriate meds, outpatient follow-up MDM Rules/Calculators/A&P MDM Number of Diagnoses or  Management Options Bad headache: new, needed workup   Amount and/or Complexity of Data Reviewed Clinical lab tests: ordered and reviewed Tests in the radiology section of CPT: ordered and reviewed Tests in the medicine section of CPT: reviewed and ordered Decide to obtain previous medical records or to obtain history from someone other than the patient: yes Review and summarize past medical records: yes Independent visualization of images, tracings, or specimens: yes  Risk of Complications, Morbidity, and/or Mortality Presenting problems: high Diagnostic procedures: high Management options: high  Critical Care Total time providing critical care: < 30 minutes  Patient Progress Patient progress: improved   Final Clinical Impression(s) / ED Diagnoses Final diagnoses:  Bad headache     Carmin Muskrat, MD 12/05/20 2332

## 2020-12-05 NOTE — Discharge Instructions (Addendum)
As discussed, your evaluation today has been largely reassuring.  But, it is important that you monitor your condition carefully, and do not hesitate to return to the ED if you develop new, or concerning changes in your condition. ? ?Otherwise, please follow-up with your physician for appropriate ongoing care. ? ?

## 2020-12-05 NOTE — ED Triage Notes (Signed)
Pt c/o headache and facial pain x 3 days. Hx HTN, recent covid. Denies weakness, sensitivity to light/sound, A&O x 4.

## 2020-12-05 NOTE — ED Provider Notes (Signed)
Emergency Medicine Provider Triage Evaluation Note  Alyssa Montgomery , a 60 y.o. female  was evaluated in triage.  Pt complains of headache for 3 days. Denies vision changes, numbness/weakness.  Review of Systems  Positive: headache Negative: Nv, numbness/weakness, vision changes  Physical Exam  BP (!) 143/96 (BP Location: Left Arm)   Pulse 75   Temp 99.1 F (37.3 C) (Oral)   Resp 18   SpO2 100%  Gen:   Awake, no distress   Resp:  Normal effort  MSK:   Moves extremities without difficulty  Other:  Cn II-XII intact. Decreased strength to the rue and rle that have been ongoing for months per patient  Medical Decision Making  Medically screening exam initiated at 4:47 PM.  Appropriate orders placed.  Alyssa Montgomery was informed that the remainder of the evaluation will be completed by another provider, this initial triage assessment does not replace that evaluation, and the importance of remaining in the ED until their evaluation is complete.     Rodney Booze, PA-C 12/05/20 1657    Lorelle Gibbs, DO 12/08/20 1116

## 2020-12-08 ENCOUNTER — Other Ambulatory Visit: Payer: Self-pay | Admitting: Family Medicine

## 2020-12-08 DIAGNOSIS — J3089 Other allergic rhinitis: Secondary | ICD-10-CM

## 2020-12-10 ENCOUNTER — Other Ambulatory Visit: Payer: Medicare Other

## 2020-12-15 DIAGNOSIS — T1512XA Foreign body in conjunctival sac, left eye, initial encounter: Secondary | ICD-10-CM | POA: Diagnosis not present

## 2020-12-22 ENCOUNTER — Ambulatory Visit: Payer: Medicare Other

## 2020-12-25 ENCOUNTER — Inpatient Hospital Stay: Admission: RE | Admit: 2020-12-25 | Payer: Medicare Other | Source: Ambulatory Visit

## 2020-12-28 ENCOUNTER — Encounter: Payer: Medicare Other | Admitting: Neurology

## 2021-01-07 ENCOUNTER — Other Ambulatory Visit: Payer: Self-pay | Admitting: Family Medicine

## 2021-01-07 DIAGNOSIS — J3089 Other allergic rhinitis: Secondary | ICD-10-CM

## 2021-01-17 ENCOUNTER — Encounter: Payer: Self-pay | Admitting: Family Medicine

## 2021-01-17 ENCOUNTER — Other Ambulatory Visit: Payer: Self-pay | Admitting: Family Medicine

## 2021-01-17 ENCOUNTER — Ambulatory Visit (INDEPENDENT_AMBULATORY_CARE_PROVIDER_SITE_OTHER): Payer: Medicare Other | Admitting: Family Medicine

## 2021-01-17 ENCOUNTER — Other Ambulatory Visit: Payer: Self-pay

## 2021-01-17 VITALS — BP 118/64 | HR 68 | Ht 64.0 in | Wt 221.6 lb

## 2021-01-17 DIAGNOSIS — Z23 Encounter for immunization: Secondary | ICD-10-CM | POA: Diagnosis not present

## 2021-01-17 DIAGNOSIS — I152 Hypertension secondary to endocrine disorders: Secondary | ICD-10-CM | POA: Diagnosis not present

## 2021-01-17 DIAGNOSIS — Z1211 Encounter for screening for malignant neoplasm of colon: Secondary | ICD-10-CM

## 2021-01-17 DIAGNOSIS — Z87891 Personal history of nicotine dependence: Secondary | ICD-10-CM

## 2021-01-17 DIAGNOSIS — Z0000 Encounter for general adult medical examination without abnormal findings: Secondary | ICD-10-CM

## 2021-01-17 DIAGNOSIS — E785 Hyperlipidemia, unspecified: Secondary | ICD-10-CM

## 2021-01-17 DIAGNOSIS — E119 Type 2 diabetes mellitus without complications: Secondary | ICD-10-CM

## 2021-01-17 DIAGNOSIS — E1159 Type 2 diabetes mellitus with other circulatory complications: Secondary | ICD-10-CM | POA: Diagnosis not present

## 2021-01-17 LAB — POCT GLYCOSYLATED HEMOGLOBIN (HGB A1C): HbA1c, POC (controlled diabetic range): 6.8 % (ref 0.0–7.0)

## 2021-01-17 MED ORDER — METFORMIN HCL ER 500 MG PO TB24
1000.0000 mg | ORAL_TABLET | Freq: Every day | ORAL | 3 refills | Status: DC
Start: 2021-01-17 — End: 2021-11-16

## 2021-01-17 MED ORDER — ZOSTER VAC RECOMB ADJUVANTED 50 MCG/0.5ML IM SUSR: 0.5000 mL | Freq: Once | INTRAMUSCULAR | 0 refills | Status: AC

## 2021-01-17 MED ORDER — PRAVASTATIN SODIUM 20 MG PO TABS: 20.0000 mg | ORAL_TABLET | Freq: Every day | ORAL | 3 refills | Status: DC

## 2021-01-17 NOTE — Progress Notes (Deleted)
    SUBJECTIVE:   CHIEF COMPLAINT / HPI:   Diabetes - A1c today 6.8 - last 08/11/20 was 6.0 - last 3 years has ranged 6.0-6.8 - current regimen includes metformin 500 mg 24 hr tablet qHS  Cholesterol - last lipid panel 5/4: LDL 112, trig 213, total 216 - current med regimen includes pravastatin 20 mg tablet qHS  Health maintenance - last mammogram 12/25/2017, next scheduled for 02/17/21 - last colonoscopy referral placed: 07/08/2019 at [facility] per patient request - cannot locate colonoscopy records in Epic; will request those from [facility] - last PAP 06/24/2019, ASCUS, HPV HR Negative. Per GYN repeat Pap in 1 year.  - TDaP last 2017, due 2027  PERTINENT  PMH / PSH: ***  OBJECTIVE:   There were no vitals taken for this visit.  ***  ASSESSMENT/PLAN:   No problem-specific Assessment & Plan notes found for this encounter.     Ezequiel Essex, MD [place]

## 2021-01-17 NOTE — Progress Notes (Signed)
aw

## 2021-01-17 NOTE — Patient Instructions (Addendum)
It was wonderful to meet you today. Thank you for allowing me to be a part of your care. Below is a short summary of what we discussed at your visit today:  Screenings PAP Smear: You are due for a PAP smear to screen for cervical cancer. Please schedule one at your earliest convenience with your gynecologist. At your last one a year ago, your gynecologist recommended a repeat in one year.   Mammograms: It looks like your next mammogram is scheduled for 9/9. We will follow up with the results.   Colonoscopy: I have referred you to GI for a colonoscopy. You should hear from the GI office for scheduling within a week.   Diabetes  -Your A1c today was 6.8, up from 6.0 last time. - Keep taking the metformin as prescribed - If the metformin is making you sick let me know and we can change to a different medicine -Call your eye doctor to schedule your routine diabetic eye exam  Ct lung cancer screening - we will run the approval through your insurance first then call you with an appointment time - if you don't hear from [place] in a couple of days, reach out and we'll check on it    Please bring all of your medications to every appointment!    If you have any questions or concerns, please do not hesitate to contact us via phone or MyChart message.     Ezequiel Essex, MD

## 2021-01-19 NOTE — Assessment & Plan Note (Addendum)
-   Referred to GI for colonoscopy - Mammogram scheduled for 02/17/2021 - Goes to gynecologist for Pap smear, currently due.  Reminded patient to schedule with GYN. - Yearly diabetic eye exam; patient has favorite ophthalmologist, reminded to schedule with them. - Prescription for shingles vaccine sent to pharmacy

## 2021-01-19 NOTE — Assessment & Plan Note (Signed)
A1c today 6.8 up from 6.0.  Patient reports spotty adherence to metformin due to medication intolerance.  Patient reports "it makes me sick".  Discussed different medication options at length, however patient hesitant to switch medications.  She would like to try better adherence to metformin for 3 months.  Discussed return precautions, including adverse side effects.  In future could consider Jardiance or Ozempic/Trulicity.

## 2021-01-19 NOTE — Progress Notes (Signed)
    SUBJECTIVE:   CHIEF COMPLAINT / HPI:   Diabetes - A1c today 6.8 - last 08/11/20 was 6.0 - last 3 years has ranged 6.0-6.8 - current regimen includes metformin 500 mg 24 hr tablet qHS  Cholesterol - last lipid panel 5/4: LDL 112, trig 213, total 216 - current med regimen includes pravastatin 20 mg tablet qHS  Health maintenance - last mammogram 12/25/2017, next scheduled for 02/17/21 - last colonoscopy referral placed: 07/08/2019 at [facility] per patient request - cannot locate colonoscopy records in Epic; patient reports she never had it done - last PAP 06/24/2019, ASCUS, HPV HR Negative. Per GYN repeat Pap in 1 year.  - TDaP last 2017, due 2027  PERTINENT  PMH / PSH: HTN, GERD, T2DM, HLD, bilateral primary osteoarthritis of hip, urinary frequency  OBJECTIVE:   BP 118/64   Pulse 68   Ht '5\' 4"'$  (1.626 m)   Wt 221 lb 9.6 oz (100.5 kg)   SpO2 99%   BMI 38.04 kg/m   PHQ-9:  Depression screen [facility] 2/9 01/17/2021 10/12/2020 08/11/2020  Decreased Interest 0 0 1  Down, Depressed, Hopeless 0 0 0  PHQ - 2 Score 0 0 1  Altered sleeping '1 1 1  '$ Tired, decreased energy '1 1 1  '$ Change in appetite 0 0 0  Feeling bad or failure about yourself  0 0 0  Trouble concentrating 0 0 0  Moving slowly or fidgety/restless 0 0 0  Suicidal thoughts 0 0 0  PHQ-9 Score '2 2 3  '$ Difficult doing work/chores Not difficult at all - Somewhat difficult  Some recent data might be hidden     GAD-7:  GAD 7 : Generalized Anxiety Score 01/27/2018  Nervous, Anxious, on Edge 1  Control/stop worrying 1  Worry too much - different things 1  Trouble relaxing 1  Restless 1  Easily annoyed or irritable 2  Afraid - awful might happen 0  Total GAD 7 Score 7     Physical Exam General: Awake, alert, oriented, no acute distress Respiratory: Unlabored respirations, speaking in full sentences, no respiratory distress Extremities: Moving all extremities spontaneously Neuro: Cranial nerves II through X  grossly intact Psych: Normal insight and judgement   ASSESSMENT/PLAN:   Diabetes mellitus without complication (HCC) 123456 today 6.8 up from 6.0.  Patient reports spotty adherence to metformin due to medication intolerance.  Patient reports "it makes me sick".  Discussed different medication options at length, however patient hesitant to switch medications.  She would like to try better adherence to metformin for 3 months.  Discussed return precautions, including adverse side effects.  In future could consider Jardiance or Ozempic/Trulicity.  History of tobacco use Patient requests CT screening for lung cancer.  Reports 40-pack-year history, quit November 2020.  No symptoms.  Will order low-dose CT today.  RN to call back patient with appointment time and day after approved by insurance.  Healthcare maintenance - Referred to GI for colonoscopy - Mammogram scheduled for 02/17/2021 - Goes to gynecologist for Pap smear, currently due.  Reminded patient to schedule with GYN. - Yearly diabetic eye exam; patient has favorite ophthalmologist, reminded to schedule with them. - Prescription for shingles vaccine sent to pharmacy     Ezequiel Essex, MD [place]

## 2021-01-19 NOTE — Assessment & Plan Note (Signed)
Patient requests CT screening for lung cancer.  Reports 40-pack-year history, quit November 2020.  No symptoms.  Will order low-dose CT today.  RN to call back patient with appointment time and day after approved by insurance.

## 2021-02-07 ENCOUNTER — Ambulatory Visit: Payer: Medicare Other | Admitting: Family Medicine

## 2021-02-07 DIAGNOSIS — E119 Type 2 diabetes mellitus without complications: Secondary | ICD-10-CM | POA: Diagnosis not present

## 2021-02-07 DIAGNOSIS — H2512 Age-related nuclear cataract, left eye: Secondary | ICD-10-CM | POA: Diagnosis not present

## 2021-02-07 DIAGNOSIS — H04122 Dry eye syndrome of left lacrimal gland: Secondary | ICD-10-CM | POA: Diagnosis not present

## 2021-02-07 DIAGNOSIS — H40012 Open angle with borderline findings, low risk, left eye: Secondary | ICD-10-CM | POA: Diagnosis not present

## 2021-02-07 LAB — HM DIABETES EYE EXAM

## 2021-02-16 DIAGNOSIS — Z96642 Presence of left artificial hip joint: Secondary | ICD-10-CM | POA: Diagnosis not present

## 2021-02-17 ENCOUNTER — Ambulatory Visit
Admission: RE | Admit: 2021-02-17 | Discharge: 2021-02-17 | Disposition: A | Discharge status: Home / Self Care | Payer: Medicare Other | Source: Ambulatory Visit | Attending: Family Medicine | Admitting: Family Medicine

## 2021-02-17 ENCOUNTER — Other Ambulatory Visit: Payer: Self-pay

## 2021-02-17 DIAGNOSIS — Z1231 Encounter for screening mammogram for malignant neoplasm of breast: Secondary | ICD-10-CM | POA: Diagnosis not present

## 2021-02-17 DIAGNOSIS — Z0000 Encounter for general adult medical examination without abnormal findings: Secondary | ICD-10-CM

## 2021-02-20 ENCOUNTER — Telehealth: Payer: Self-pay

## 2021-02-20 NOTE — Telephone Encounter (Signed)
Spoke with patient informed her of CT Scan appt. Patient wrote down the information. While on the phone patient asked I someone would be able to help her schedule a colonoscopy. I informed her that I will reach out to her PCP and go from there, and will reach out to her sometime tomorrow. Salvatore Marvel, CMA

## 2021-02-20 NOTE — Telephone Encounter (Signed)
2nd attempt to reach patient to inform of  CT Scan appt at [facility]. Mon Sep. 19th at 2:45.

## 2021-02-27 ENCOUNTER — Ambulatory Visit (HOSPITAL_COMMUNITY): Payer: Medicare Other

## 2021-03-06 ENCOUNTER — Encounter: Payer: Self-pay | Admitting: Internal Medicine

## 2021-03-07 ENCOUNTER — Encounter: Payer: Self-pay | Admitting: Internal Medicine

## 2021-03-08 ENCOUNTER — Other Ambulatory Visit: Payer: Self-pay | Admitting: Family Medicine

## 2021-03-08 DIAGNOSIS — J3089 Other allergic rhinitis: Secondary | ICD-10-CM

## 2021-03-09 NOTE — Progress Notes (Deleted)
    SUBJECTIVE:   CHIEF COMPLAINT / HPI:   ***  The Pharmacy team is conducting a quality improvement initiative. The recommendation below is for your consideration.   This patient with Hyperlipidemia and Diabetes may potentially benefit from additional lipid-lowering therapy to achieve LDL goal of < 100 mg/dL and Triglyceride < 150mg /dl, and has an appointment with you on 03/10/2021.   LDL = 92  Trig = 126  Obtained on 10/12/2020   Their current pertinent medications include: pravastatin 20 mg  Documented statin intolerance: none      If appropriate, please consider the additional therapy recommendations below:  (1). Consider discontinuing pravastatin 20 mg. Can consider starting rosuvastatin 10-20 mg daily for additional LDL lowering.    (2). Suggest F/u Direct LDL  in 3 months to assess for medication efficacy   PERTINENT  PMH / PSH: ***  OBJECTIVE:   There were no vitals taken for this visit.  ***  ASSESSMENT/PLAN:   No problem-specific Assessment & Plan notes found for this encounter.     Ezequiel Essex, MD [place]

## 2021-03-10 ENCOUNTER — Ambulatory Visit: Payer: Medicare Other | Admitting: Family Medicine

## 2021-03-15 ENCOUNTER — Encounter (HOSPITAL_COMMUNITY): Payer: Self-pay

## 2021-03-15 ENCOUNTER — Ambulatory Visit (HOSPITAL_COMMUNITY)
Admission: EM | Admit: 2021-03-15 | Discharge: 2021-03-15 | Disposition: A | Discharge status: Home / Self Care | Payer: Medicare Other | Attending: Medical Oncology | Admitting: Medical Oncology

## 2021-03-15 ENCOUNTER — Other Ambulatory Visit: Payer: Self-pay

## 2021-03-15 DIAGNOSIS — M79671 Pain in right foot: Secondary | ICD-10-CM

## 2021-03-15 DIAGNOSIS — M79672 Pain in left foot: Secondary | ICD-10-CM

## 2021-03-15 MED ORDER — MELOXICAM 15 MG PO TABS: 15.0000 mg | ORAL_TABLET | Freq: Every day | ORAL | 0 refills | Status: DC

## 2021-03-15 NOTE — ED Triage Notes (Signed)
Pt presents with bilateral foot pain after she accidentally kicked both of her feet. Pt needs work note

## 2021-03-15 NOTE — ED Provider Notes (Signed)
[facility]    CSN: [id number] Arrival date & time: 03/15/21  1707      History   Chief Complaint Chief Complaint  Patient presents with   bilateral foot pain    HPI Alyssa Montgomery is a 60 y.o. female.   HPI  Foot Pain: Patient reports that over the past few days she has accidentally hit both of her big toes on either her bed or her dresser when getting up at night to go to the bathroom.  She states that she has neuropathy in both feet and so her sensation varies from numbness to pain.  She has not tried anything other than her gabapentin for symptoms.  She denies any bleeding, discharge or skin breakdown.  Past Medical History:  Diagnosis Date   Anxiety    Bilateral carpal tunnel syndrome 03/03/2018   Bronchitis    Cervical spondylosis without myelopathy 07/29/2018   Cervicalgia 11/16/2014   Cervicogenic headache 07/29/2018   Chronic bilateral low back pain with bilateral sciatica 09/18/2018   Claudication ([place]) 09/03/2016   Diverticulitis    FIBROIDS, UTERUS 03/09/2010   Qualifier: Diagnosis of  By: Carlean Purl MD, Tonna Boehringer E    Fibromyalgia    Fibromyalgia 06/18/2014   GERD (gastroesophageal reflux disease)    Goiter 02/28/2013   Headache    History of cervical dysplasia 01/18/2016   Hypertension associated with diabetes ([place]) 12/20/2019   Left carpal tunnel syndrome 01/27/2018   Neuropathy    Pain in right ankle and joints of right foot 03/17/2019   Plantar fasciitis    Spinal stenosis of cervical region 07/29/2018   Substance abuse ([place])    10 years ago ( cocaine)   TIA (transient ischemic attack) 08/10/2016   Tobacco abuse 01/18/2012   Tobacco use disorder 04/17/2015    Patient Active Problem List   Diagnosis Date Noted   Gait abnormality 11/22/2020   Neck pain 11/22/2020   Right leg numbness 11/22/2020   OA (osteoarthritis) of hip 09/12/2020   Primary osteoarthritis of left hip 09/12/2020   Pre-operative clearance 08/12/2020   Neuropathy  02/15/2020   Bilateral primary osteoarthritis of hip 02/10/2020   Left foot pain 12/20/2019   Hypertension associated with diabetes ([place]) 12/20/2019   Urinary frequency 11/30/2019   Left hip pain 11/23/2019   Trochanteric bursitis of left hip 11/23/2019   Non-seasonal allergic rhinitis 07/13/2019   Hyperlipidemia 11/14/2017   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   Other general symptoms and signs 09/06/2016   Diabetes mellitus without complication ([place]) 43/15/4008   Healthcare maintenance 04/17/2015   History of tobacco use 04/17/2015   Complete tear of right rotator cuff 11/03/2014   UTI (urinary tract infection) 06/19/2012   GERD 12/26/2006    Past Surgical History:  Procedure Laterality Date   ANKLE SURGERY     BACK SURGERY     Bowel obstruction     CARPAL TUNNEL RELEASE     CERVICAL CONIZATION W/BX N/A 12/27/2015   Procedure: CONIZATION CERVIX WITH BIOPSY;  Surgeon: Terrance Mass, MD;  Location: [facility];  Service: Gynecology;  Laterality: N/A;   DILATATION & CURETTAGE/HYSTEROSCOPY WITH MYOSURE N/A 12/27/2015   Procedure: DILATATION & CURETTAGE/HYSTEROSCOPY WITH MYOSURE;  Surgeon: Terrance Mass, MD;  Location: [facility];  Service: Gynecology;  Laterality: N/A;   ECTOPIC PREGNANCY SURGERY     EYE SURGERY     removed right eye;    EYE SURGERY     TOTAL HIP ARTHROPLASTY Left 09/12/2020   Procedure:  TOTAL HIP ARTHROPLASTY ANTERIOR APPROACH;  Surgeon: Gaynelle Arabian, MD;  Location: [facility];  Service: Orthopedics;  Laterality: Left;  149min    OB History     Gravida  6   Para  2   Term  2   Preterm      AB  4   Living  2      SAB  2   IAB  1   Ectopic  1   Multiple      Live Births               Home Medications    Prior to Admission medications   Medication Sig Start Date End Date Taking? Authorizing Provider  acetaminophen (TYLENOL) 500 MG tablet Take 1,000 mg by mouth every 6 (six) hours as needed (pain).    [provider]  albuterol  (VENTOLIN HFA) 108 (90 Base) MCG/ACT inhaler INHALE 2 PUFFS INTO THE LUNGS EVERY 6 HOURS AS NEEDED FOR WHEEZING/SHORTNESS OF BREATH 01/10/21   Ezequiel Essex, MD  bismuth subsalicylate (PEPTO BISMOL) 262 MG/15ML suspension Take 30 mLs by mouth every other day.    [provider]  Cholecalciferol (VITAMIN D-3) 125 MCG (5000 UT) TABS Take 5,000 Units by mouth daily.    [provider]  Cyanocobalamin (VITAMIN B-12) 5000 MCG TBDP Take 5,000 mcg by mouth daily.    [provider]  cyclobenzaprine (FLEXERIL) 10 MG tablet Take 10 mg by mouth daily as needed for muscle spasms. Patient not taking: Reported on 03/15/2021 08/21/20   [provider]  ECHINACEA PO Take 1 tablet by mouth daily.    [provider]  ELDERBERRY PO Take 1 tablet by mouth daily. Gummie    [provider]  fluticasone (FLONASE) 50 MCG/ACT nasal spray PLACE 1 SPRAY INTO BOTH NOSTRILS DAILY AS NEEDED FOR ALLERGIES OR RHINITIS. 03/08/21   Ezequiel Essex, MD  gabapentin (NEURONTIN) 300 MG capsule Take 1 capsule (300 mg total) by mouth 3 (three) times daily. 11/22/20   Marcial Pacas, MD  Menthol, Topical Analgesic, (BENGAY EX) Apply 1 application topically daily.    [provider]  metFORMIN (GLUCOPHAGE-XR) 500 MG 24 hr tablet Take 2 tablets (1,000 mg total) by mouth daily with breakfast. 01/17/21 04/17/21  Ezequiel Essex, MD  molnupiravir EUA 200 mg CAPS Take 4 capsules by mouth 2 (two) times daily.    [provider]  naproxen sodium (ALEVE) 220 MG tablet Take 440 mg by mouth See admin instructions. Take 2 tablets (440 mg) by mouth every morning, may also take 2 tablets (440 mg) every evening as needed for pain Patient not taking: Reported on 03/15/2021    [provider]  oxyCODONE (OXY IR/ROXICODONE) 5 MG immediate release tablet Take 5 mg by mouth daily as needed (pain). Patient not taking: Reported on 03/15/2021    [provider]  pravastatin (PRAVACHOL)  20 MG tablet Take 1 tablet (20 mg total) by mouth at bedtime. 01/17/21   Ezequiel Essex, MD  pseudoephedrine (SUDAFED 12 HOUR) 120 MG 12 hr tablet Take 1 tablet (120 mg total) by mouth 2 (two) times daily. Use twice daily for five days 12/05/20   Carmin Muskrat, MD  Simethicone (GAS-X PO) Take 1 tablet by mouth 5 (five) times daily as needed (flatulence/bloating).    [provider]  triamcinolone (NASACORT) 55 MCG/ACT AERO nasal inhaler Place 2 sprays into the nose daily. Use daily for five days Patient not taking: Reported on 03/15/2021 12/05/20   Vanita Panda,  Herbie Baltimore, MD  vitamin C (ASCORBIC ACID) 500 MG tablet Take 500 mg by mouth daily.    [provider]    Family History Family History  Problem Relation Age of Onset   Cancer Father        lung   Hypertension Mother    Arthritis Mother    Other Neg Hx        pituitary problem    Social History Social History   Tobacco Use   Smoking status: Former    Packs/day: 0.50    Years: 18.00    Pack years: 9.00    Types: Cigarettes    Quit date: 04/27/2019    Years since quitting: 1.8   Smokeless tobacco: Never   Tobacco comments:    1-800 number given   Vaping Use   Vaping Use: Never used  Substance Use Topics   Alcohol use: Yes    Alcohol/week: 0.0 standard drinks    Comment: Occasional   Drug use: No    Comment: clean 10 years crack cocaine     Allergies   Zanaflex [tizanidine hcl], Penicillins, and Sulfonamide derivatives   Review of Systems Review of Systems  As stated above in HPI Physical Exam Triage Vital Signs ED Triage Vitals  Enc Vitals Group     BP 03/15/21 1801 126/67     Pulse Rate 03/15/21 1801 70     Resp 03/15/21 1801 14     Temp 03/15/21 1801 98.3 F (36.8 C)     Temp Source 03/15/21 1801 Oral     SpO2 03/15/21 1801 99 %     Weight --      Height --      Head Circumference --      Peak Flow --      Pain Score 03/15/21 1803 7     Pain Loc --      Pain Edu? --      Excl. in  [place]? --    No data found.  Updated Vital Signs BP 126/67 (BP Location: Left Arm)   Pulse 70   Temp 98.3 F (36.8 C) (Oral)   Resp 14   SpO2 99%   Physical Exam Vitals and nursing note reviewed.  Constitutional:      General: She is not in acute distress.    Appearance: Normal appearance. She is not ill-appearing, toxic-appearing or diaphoretic.  Cardiovascular:     Pulses: Normal pulses.  Skin:    General: Skin is warm.     Capillary Refill: Capillary refill takes less than 2 seconds.     Findings: No bruising, erythema, lesion or rash.     Comments: The right toenail does have some breakage but this does not extend up through the nail.  There is no discharge, erythema or bleeding  Neurological:     Mental Status: She is alert and oriented to person, place, and time.     UC Treatments / Results  Labs (all labs ordered are listed, but only abnormal results are displayed) Labs Reviewed - No data to display  EKG   Radiology No results found.  Procedures Procedures (including critical care time)  Medications Ordered in UC Medications - No data to display  Initial Impression / Assessment and Plan / UC Course  I have reviewed the triage vital signs and the nursing notes.  Pertinent labs & imaging results that were available during my care of the patient were reviewed by me and considered in my medical  decision making (see chart for details).     New.  Discussed that she may want to see a podiatrist regarding the broken nail to help reduce it extending up further.  She also may want to discuss her peripheral neuropathy with them and she will continue to use caution to prevent reoccurrence of injury in the future.  She asked for pain medication and so we are going to trial her on Mobic given her recent BMP showed normal kidney function. Final Clinical Impressions(s) / UC Diagnoses   Final diagnoses:  None   Discharge Instructions   None    ED Prescriptions    None    PDMP not reviewed this encounter.   Hughie Closs, [place] 03/15/21 1847

## 2021-03-16 ENCOUNTER — Ambulatory Visit: Payer: Medicare Other

## 2021-03-22 DIAGNOSIS — R202 Paresthesia of skin: Secondary | ICD-10-CM | POA: Diagnosis not present

## 2021-03-22 DIAGNOSIS — M79671 Pain in right foot: Secondary | ICD-10-CM | POA: Diagnosis not present

## 2021-03-22 DIAGNOSIS — M25571 Pain in right ankle and joints of right foot: Secondary | ICD-10-CM | POA: Diagnosis not present

## 2021-03-22 DIAGNOSIS — M67961 Unspecified disorder of synovium and tendon, right lower leg: Secondary | ICD-10-CM | POA: Diagnosis not present

## 2021-03-22 DIAGNOSIS — M12571 Traumatic arthropathy, right ankle and foot: Secondary | ICD-10-CM | POA: Diagnosis not present

## 2021-03-22 DIAGNOSIS — M67962 Unspecified disorder of synovium and tendon, left lower leg: Secondary | ICD-10-CM | POA: Diagnosis not present

## 2021-04-03 ENCOUNTER — Ambulatory Visit (INDEPENDENT_AMBULATORY_CARE_PROVIDER_SITE_OTHER): Payer: Medicare Other | Admitting: Family Medicine

## 2021-04-03 DIAGNOSIS — Z91199 Patient's noncompliance with other medical treatment and regimen due to unspecified reason: Secondary | ICD-10-CM

## 2021-04-03 NOTE — Progress Notes (Signed)
No show for appointment. Will send letter.   Ezequiel Essex, MD

## 2021-04-05 ENCOUNTER — Ambulatory Visit: Payer: Medicare Other | Admitting: Family Medicine

## 2021-04-07 NOTE — Addendum Note (Signed)
Addended by: Renard Hamper on: 04/07/2021 10:09 AM   Modules accepted: Orders, SmartSet

## 2021-04-16 ENCOUNTER — Other Ambulatory Visit: Payer: Self-pay | Admitting: Family Medicine

## 2021-04-16 DIAGNOSIS — E785 Hyperlipidemia, unspecified: Secondary | ICD-10-CM

## 2021-04-20 ENCOUNTER — Encounter: Payer: Self-pay | Admitting: Family Medicine

## 2021-04-21 DIAGNOSIS — M5451 Vertebrogenic low back pain: Secondary | ICD-10-CM | POA: Diagnosis not present

## 2021-04-21 DIAGNOSIS — M5459 Other low back pain: Secondary | ICD-10-CM | POA: Diagnosis not present

## 2021-04-21 DIAGNOSIS — R52 Pain, unspecified: Secondary | ICD-10-CM | POA: Diagnosis not present

## 2021-04-22 ENCOUNTER — Other Ambulatory Visit: Payer: Self-pay

## 2021-04-22 ENCOUNTER — Encounter (HOSPITAL_COMMUNITY): Payer: Self-pay

## 2021-04-22 ENCOUNTER — Ambulatory Visit (HOSPITAL_COMMUNITY)
Admission: EM | Admit: 2021-04-22 | Discharge: 2021-04-22 | Disposition: A | Discharge status: Home / Self Care | Payer: Medicare Other | Attending: Emergency Medicine | Admitting: Emergency Medicine

## 2021-04-22 DIAGNOSIS — R82998 Other abnormal findings in urine: Secondary | ICD-10-CM | POA: Diagnosis not present

## 2021-04-22 DIAGNOSIS — M169 Osteoarthritis of hip, unspecified: Secondary | ICD-10-CM | POA: Diagnosis not present

## 2021-04-22 DIAGNOSIS — R051 Acute cough: Secondary | ICD-10-CM

## 2021-04-22 DIAGNOSIS — J3089 Other allergic rhinitis: Secondary | ICD-10-CM | POA: Diagnosis not present

## 2021-04-22 DIAGNOSIS — R10A2 Flank pain, left side: Secondary | ICD-10-CM

## 2021-04-22 DIAGNOSIS — J029 Acute pharyngitis, unspecified: Secondary | ICD-10-CM

## 2021-04-22 DIAGNOSIS — R109 Unspecified abdominal pain: Secondary | ICD-10-CM | POA: Insufficient documentation

## 2021-04-22 LAB — POCT URINALYSIS DIPSTICK, ED / UC
Bilirubin Urine: NEGATIVE
Glucose, UA: NEGATIVE mg/dL
Hgb urine dipstick: NEGATIVE
Ketones, ur: NEGATIVE mg/dL
Nitrite: NEGATIVE
Protein, ur: NEGATIVE mg/dL
Specific Gravity, Urine: 1.025 (ref 1.005–1.030)
Urobilinogen, UA: 0.2 mg/dL (ref 0.0–1.0)
pH: 5.5 (ref 5.0–8.0)

## 2021-04-22 LAB — POC INFLUENZA A AND B ANTIGEN (URGENT CARE ONLY)
INFLUENZA A ANTIGEN, POC: NEGATIVE
INFLUENZA B ANTIGEN, POC: NEGATIVE

## 2021-04-22 LAB — POCT RAPID STREP A, ED / UC: Streptococcus, Group A Screen (Direct): NEGATIVE

## 2021-04-22 MED ORDER — IBUPROFEN 800 MG PO TABS
800.0000 mg | ORAL_TABLET | Freq: Once | ORAL | Status: AC
  Administered 2021-04-22: 800 mg via ORAL

## 2021-04-22 MED ORDER — ACETAMINOPHEN 500 MG PO TABS: 1000.0000 mg | ORAL_TABLET | Freq: Three times a day (TID) | ORAL | 2 refills | Status: AC

## 2021-04-22 MED ORDER — IPRATROPIUM BROMIDE 0.06 % NA SOLN: 2.0000 | Freq: Four times a day (QID) | NASAL | 12 refills | Status: DC

## 2021-04-22 MED ORDER — PROMETHAZINE-DM 6.25-15 MG/5ML PO SYRP: 5.0000 mL | ORAL_SOLUTION | Freq: Four times a day (QID) | ORAL | 0 refills | Status: AC | PRN

## 2021-04-22 MED ORDER — CEPHALEXIN 500 MG PO CAPS: 500.0000 mg | ORAL_CAPSULE | Freq: Four times a day (QID) | ORAL | 0 refills | Status: AC

## 2021-04-22 MED ORDER — NAPROXEN SODIUM 220 MG PO TABS
220.0000 mg | ORAL_TABLET | Freq: Two times a day (BID) | ORAL | 0 refills | Status: AC | PRN
Start: 2021-04-22 — End: 2021-04-29

## 2021-04-22 MED ORDER — ALBUTEROL SULFATE HFA 108 (90 BASE) MCG/ACT IN AERS: 2.0000 | INHALATION_SPRAY | Freq: Four times a day (QID) | RESPIRATORY_TRACT | 0 refills | Status: DC | PRN

## 2021-04-22 MED ORDER — KETOROLAC TROMETHAMINE 60 MG/2ML IM SOLN
60.0000 mg | Freq: Once | INTRAMUSCULAR | Status: DC
Start: 2021-04-22 — End: 2021-04-22

## 2021-04-22 MED ORDER — FLUTICASONE PROPIONATE 50 MCG/ACT NA SUSP: 1.0000 | Freq: Every day | NASAL | 0 refills | Status: DC

## 2021-04-22 MED ORDER — IBUPROFEN 800 MG PO TABS
ORAL_TABLET | ORAL | Status: AC
  Filled 2021-04-22: qty 1

## 2021-04-22 MED ORDER — MELOXICAM 15 MG PO TABS: 15.0000 mg | ORAL_TABLET | Freq: Every day | ORAL | 2 refills | Status: DC

## 2021-04-22 NOTE — ED Triage Notes (Signed)
Pt reports cough, sore throat x 1 week; numbness in right arm and bilateral feet  x 2 weeks; left sided back pain radiates to left leg x 4-5 weeks.

## 2021-04-22 NOTE — Discharge Instructions (Addendum)
For your pain: You declined an injection of ketorolac today in the office, this have provided you significant pain relief for the next 12 to 24 hours. Begin Tylenol 1000 mg 3 times daily scheduled for prevention of pain. You also declined a prescription for meloxicam 15 mg daily for anti-inflammatory pain relief.  Meloxicam is a safe medication that can be taken daily over long periods of time.  Instead, you requested naproxen which did prescribe for you but because naproxen cannot be taken long-term, I am only able to give you enough to last 7 days.  For your cough: Your rapid flu test today was negative.  Your rapid strep test today was negative. Begin promethazine dextromethorphan cough syrup, 5 mL 4 times daily as needed for cough. Resume fluticasone nasal spray once daily.  Add ipratropium nasal spray 2 sprays in each nostril 4 times daily for significant relief of nasal congestion, postnasal drip and cough.  For presumed urinary tract infection: Begin Keflex, 1 tablet 4 times daily for 7 days.  As we discussed, if you are having any issues with bacterial upper respiratory infection, Keflex will treat this as well.

## 2021-04-22 NOTE — ED Provider Notes (Signed)
[place]    CSN: [id number] Arrival date & time: 04/22/21  1602   History   Chief Complaint Chief Complaint  Patient presents with   Sore Throat        Cough   HPI Alyssa Montgomery is a 60 y.o. female. Pt reports cough, sore throat x 1 week, vital signs are normal on arrival today.  Patient also complains of numbness in her right arm as well as her bilateral feet for the past 2 weeks.  Finally, patient complains of left-sided back pain that radiates to her left leg, states is been going on for 4 to 5 weeks.    EMR reviewed, patient was seen on March 15, 2021 for bilateral foot pain, was started on a trial of Mobic and advised to follow-up with her primary care provider.  Patient was a no-show at her primary care provider visit.  The history is provided by the patient.   Past Medical History:  Diagnosis Date   Anxiety    Bilateral carpal tunnel syndrome 03/03/2018   Bronchitis    Cervical spondylosis without myelopathy 07/29/2018   Cervicalgia 11/16/2014   Cervicogenic headache 07/29/2018   Chronic bilateral low back pain with bilateral sciatica 09/18/2018   Claudication ([place]) 09/03/2016   Diverticulitis    FIBROIDS, UTERUS 03/09/2010   Qualifier: Diagnosis of  By: Carlean Purl MD, Tonna Boehringer E    Fibromyalgia    Fibromyalgia 06/18/2014   GERD (gastroesophageal reflux disease)    Goiter 02/28/2013   Headache    History of cervical dysplasia 01/18/2016   Hypertension associated with diabetes ([place]) 12/20/2019   Left carpal tunnel syndrome 01/27/2018   Neuropathy    Pain in right ankle and joints of right foot 03/17/2019   Plantar fasciitis    Spinal stenosis of cervical region 07/29/2018   Substance abuse ([place])    10 years ago ( cocaine)   TIA (transient ischemic attack) 08/10/2016   Tobacco abuse 01/18/2012   Tobacco use disorder 04/17/2015   Patient Active Problem List   Diagnosis Date Noted   Gait abnormality 11/22/2020   Neck pain 11/22/2020    Right leg numbness 11/22/2020   OA (osteoarthritis) of hip 09/12/2020   Primary osteoarthritis of left hip 09/12/2020   Pre-operative clearance 08/12/2020   Neuropathy 02/15/2020   Bilateral primary osteoarthritis of hip 02/10/2020   Left foot pain 12/20/2019   Hypertension associated with diabetes ([place]) 12/20/2019   Urinary frequency 11/30/2019   Left hip pain 11/23/2019   Trochanteric bursitis of left hip 11/23/2019   Non-seasonal allergic rhinitis 07/13/2019   Hyperlipidemia 11/14/2017   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   Other general symptoms and signs 09/06/2016   Diabetes mellitus without complication ([place]) 78/29/5621   Healthcare maintenance 04/17/2015   History of tobacco use 04/17/2015   Complete tear of right rotator cuff 11/03/2014   UTI (urinary tract infection) 06/19/2012   GERD 12/26/2006   Past Surgical History:  Procedure Laterality Date   ANKLE SURGERY     BACK SURGERY     Bowel obstruction     CARPAL TUNNEL RELEASE     CERVICAL CONIZATION W/BX N/A 12/27/2015   Procedure: CONIZATION CERVIX WITH BIOPSY;  Surgeon: Terrance Mass, MD;  Location: [facility];  Service: Gynecology;  Laterality: N/A;   DILATATION & CURETTAGE/HYSTEROSCOPY WITH MYOSURE N/A 12/27/2015   Procedure: DILATATION & CURETTAGE/HYSTEROSCOPY WITH MYOSURE;  Surgeon: Terrance Mass, MD;  Location: [facility];  Service: Gynecology;  Laterality: N/A;  ECTOPIC PREGNANCY SURGERY     EYE SURGERY     removed right eye;    EYE SURGERY     TOTAL HIP ARTHROPLASTY Left 09/12/2020   Procedure: TOTAL HIP ARTHROPLASTY ANTERIOR APPROACH;  Surgeon: Gaynelle Arabian, MD;  Location: [facility];  Service: Orthopedics;  Laterality: Left;  127min   OB History     Gravida  6   Para  2   Term  2   Preterm      AB  4   Living  2      SAB  2   IAB  1   Ectopic  1   Multiple      Live Births             Home Medications    Prior to Admission medications   Medication Sig Start Date End Date  Taking? Authorizing Provider  cephALEXin (KEFLEX) 500 MG capsule Take 1 capsule (500 mg total) by mouth 4 (four) times daily for 7 days. 04/22/21 04/29/21 Yes Lynden Oxford Scales, PA-C  ipratropium (ATROVENT) 0.06 % nasal spray Place 2 sprays into both nostrils 4 (four) times daily. 04/22/21  Yes Lynden Oxford Scales, PA-C  naproxen sodium (ALEVE) 220 MG tablet Take 1 tablet (220 mg total) by mouth 2 (two) times daily as needed for up to 7 days. 04/22/21 04/29/21 Yes Lynden Oxford Scales, PA-C  promethazine-dextromethorphan (PROMETHAZINE-DM) 6.25-15 MG/5ML syrup Take 5 mLs by mouth 4 (four) times daily as needed for up to 9 days for cough. 04/22/21 05/01/21 Yes Lynden Oxford Scales, PA-C  acetaminophen (TYLENOL) 500 MG tablet Take 2 tablets (1,000 mg total) by mouth every 8 (eight) hours. 04/22/21 07/21/21  Lynden Oxford Scales, PA-C  albuterol (VENTOLIN HFA) 108 (90 Base) MCG/ACT inhaler Inhale 2 puffs into the lungs every 6 (six) hours as needed for wheezing or shortness of breath (cough). 04/22/21   Lynden Oxford Scales, PA-C  fluticasone (FLONASE) 50 MCG/ACT nasal spray Place 1 spray into both nostrils daily. 04/22/21 05/22/21  Lynden Oxford Scales, PA-C  metFORMIN (GLUCOPHAGE-XR) 500 MG 24 hr tablet Take 2 tablets (1,000 mg total) by mouth daily with breakfast. 01/17/21 04/17/21  Ezequiel Essex, MD  pravastatin (PRAVACHOL) 20 MG tablet TAKE 1 TABLET BY MOUTH EVERYDAY AT BEDTIME 04/17/21   Ezequiel Essex, MD   Family History Family History  Problem Relation Age of Onset   Cancer Father        lung   Hypertension Mother    Arthritis Mother    Other Neg Hx        pituitary problem   Social History Social History   Tobacco Use   Smoking status: Former    Packs/day: 0.50    Years: 18.00    Pack years: 9.00    Types: Cigarettes    Quit date: 04/27/2019    Years since quitting: 1.9   Smokeless tobacco: Never   Tobacco comments:    1-800 number given   Vaping Use   Vaping  Use: Never used  Substance Use Topics   Alcohol use: Yes    Alcohol/week: 0.0 standard drinks    Comment: Occasional   Drug use: No    Comment: clean 10 years crack cocaine   Allergies   Zanaflex [tizanidine hcl], Penicillins, and Sulfonamide derivatives  Review of Systems Review of Systems Pertinent findings noted in history of present illness.   Physical Exam Triage Vital Signs ED Triage Vitals  Enc Vitals Group     BP 04/07/21  0827 (!) 147/82     Pulse Rate 04/07/21 0827 72     Resp 04/07/21 0827 18     Temp 04/07/21 0827 98.3 F (36.8 C)     Temp Source 04/07/21 0827 Oral     SpO2 04/07/21 0827 98 %     Weight --      Height --      Head Circumference --      Peak Flow --      Pain Score 04/07/21 0826 5     Pain Loc --      Pain Edu? --      Excl. in [place]? --    No data found.  Updated Vital Signs BP 128/87 (BP Location: Right Arm)   Pulse 72   Temp 98.4 F (36.9 C) (Oral)   Resp 18   SpO2 100%   Visual Acuity Right Eye Distance:   Left Eye Distance:   Bilateral Distance:    Right Eye Near:   Left Eye Near:    Bilateral Near:     Physical Exam Vitals and nursing note reviewed.  Constitutional:      General: She is not in acute distress.    Appearance: Normal appearance. She is not ill-appearing, toxic-appearing or diaphoretic.  HENT:     Head: Normocephalic and atraumatic.     Salivary Glands: Right salivary gland is not diffusely enlarged or tender. Left salivary gland is not diffusely enlarged or tender.     Right Ear: Tympanic membrane, ear canal and external ear normal. No drainage. No middle ear effusion. There is no impacted cerumen. Tympanic membrane is not erythematous or bulging.     Left Ear: Tympanic membrane, ear canal and external ear normal. No drainage.  No middle ear effusion. There is no impacted cerumen. Tympanic membrane is not erythematous or bulging.     Nose: Nose normal. No nasal deformity, septal deviation, mucosal edema,  congestion or rhinorrhea.     Right Turbinates: Not enlarged, swollen or pale.     Left Turbinates: Not enlarged, swollen or pale.     Right Sinus: No maxillary sinus tenderness or frontal sinus tenderness.     Left Sinus: No maxillary sinus tenderness or frontal sinus tenderness.     Mouth/Throat:     Lips: Pink. No lesions.     Mouth: Mucous membranes are moist. No oral lesions.     Pharynx: Oropharynx is clear. Uvula midline. No posterior oropharyngeal erythema or uvula swelling.     Tonsils: No tonsillar exudate. 0 on the right. 0 on the left.  Eyes:     General: Lids are normal.        Right eye: No discharge.        Left eye: No discharge.     Extraocular Movements: Extraocular movements intact.     Conjunctiva/sclera: Conjunctivae normal.     Right eye: Right conjunctiva is not injected.     Left eye: Left conjunctiva is not injected.  Neck:     Trachea: Trachea and phonation normal.  Cardiovascular:     Rate and Rhythm: Normal rate and regular rhythm.     Pulses: Normal pulses.     Heart sounds: Normal heart sounds. No murmur heard.   No friction rub. No gallop.  Pulmonary:     Effort: Pulmonary effort is normal. No accessory muscle usage, prolonged expiration or respiratory distress.     Breath sounds: Normal breath sounds. No stridor, decreased air movement or transmitted  upper airway sounds. No decreased breath sounds, wheezing, rhonchi or rales.  Chest:     Chest wall: No tenderness.  Abdominal:     General: Abdomen is flat. Bowel sounds are normal. There is no distension.     Palpations: Abdomen is soft.     Tenderness: There is no abdominal tenderness. There is no right CVA tenderness or left CVA tenderness.     Hernia: No hernia is present.  Musculoskeletal:        General: Normal range of motion.     Cervical back: Normal range of motion and neck supple. Normal range of motion.     Right lower leg: 1+ Edema present.     Left lower leg: 1+ Edema present.   Lymphadenopathy:     Cervical: No cervical adenopathy.  Skin:    General: Skin is warm and dry.     Capillary Refill: Capillary refill takes less than 2 seconds.     Findings: No bruising, erythema, lesion or rash.  Neurological:     General: No focal deficit present.     Mental Status: She is alert and oriented to person, place, and time.  Psychiatric:        Mood and Affect: Mood normal.        Behavior: Behavior normal.   UC Treatments / Results  Labs (all labs ordered are listed, but only abnormal results are displayed)  Labs Reviewed  POCT URINALYSIS DIPSTICK, ED / UC - Abnormal; Notable for the following components:      Result Value   Leukocytes,Ua TRACE (*)    All other components within normal limits  URINE CULTURE  CULTURE, GROUP A STREP ([place])  POC INFLUENZA A AND B ANTIGEN (URGENT CARE ONLY)  POCT RAPID STREP A, ED / UC    EKG  Radiology No results found.  Procedures Procedures (including critical care time)  Medications Ordered in UC Medications  ibuprofen (ADVIL) tablet 800 mg (800 mg Oral Given 04/22/21 1810)    Initial Impression / Assessment and Plan / UC Course  I have reviewed the triage vital signs and the nursing notes.  Pertinent labs & imaging results that were available during my care of the patient were reviewed by me and considered in my medical decision making (see chart for details).      Patient was advised that I recommend that she take meloxicam and acetaminophen for her pain.  Patient refused meloxicam as well as ketorolac injection.  Patient requested naproxen, patient advised that I cannot prescribe her this medication long-term but we will provide it to her for 1 week.  Urinalysis today reveals trace leukocytes.  Urine will be sent for culture.  Patient will be treated as with Keflex, antibiotic regimen will be adjusted as needed based on results of urine culture.  Final Clinical Impressions(s) / UC Diagnoses   Final diagnoses:   Acute left flank pain  Acute pharyngitis, unspecified etiology  Acute cough  Osteoarthritis of hip, unspecified laterality, unspecified osteoarthritis type  Leukocytes in urine     Discharge Instructions      For your pain: You declined an injection of ketorolac today in the office, this have provided you significant pain relief for the next 12 to 24 hours. Begin Tylenol 1000 mg 3 times daily scheduled for prevention of pain. You also declined a prescription for meloxicam 15 mg daily for anti-inflammatory pain relief.  Meloxicam is a safe medication that can be taken daily over long periods of  time.  Instead, you requested naproxen which did prescribe for you but because naproxen cannot be taken long-term, I am only able to give you enough to last 7 days.  For your cough: Your rapid flu test today was negative.  Your rapid strep test today was negative. Begin promethazine dextromethorphan cough syrup, 5 mL 4 times daily as needed for cough. Resume fluticasone nasal spray once daily.  Add ipratropium nasal spray 2 sprays in each nostril 4 times daily for significant relief of nasal congestion, postnasal drip and cough.  For presumed urinary tract infection: Begin Keflex, 1 tablet 4 times daily for 7 days.  As we discussed, if you are having any issues with bacterial upper respiratory infection, Keflex will treat this as well.     ED Prescriptions     Medication Sig Dispense Auth. Provider   acetaminophen (TYLENOL) 500 MG tablet Take 2 tablets (1,000 mg total) by mouth every 8 (eight) hours. 180 tablet Lynden Oxford Scales, PA-C   cephALEXin (KEFLEX) 500 MG capsule Take 1 capsule (500 mg total) by mouth 4 (four) times daily for 7 days. 28 capsule Lynden Oxford Scales, PA-C   promethazine-dextromethorphan (PROMETHAZINE-DM) 6.25-15 MG/5ML syrup Take 5 mLs by mouth 4 (four) times daily as needed for up to 9 days for cough. 180 mL Lynden Oxford Scales, PA-C   meloxicam (MOBIC) 15  MG tablet  (Status: Discontinued) Take 1 tablet (15 mg total) by mouth daily. 30 tablet Lynden Oxford Scales, PA-C   fluticasone (FLONASE) 50 MCG/ACT nasal spray Place 1 spray into both nostrils daily. 18 mL Lynden Oxford Scales, PA-C   ipratropium (ATROVENT) 0.06 % nasal spray Place 2 sprays into both nostrils 4 (four) times daily. 15 mL Lynden Oxford Scales, PA-C   albuterol (VENTOLIN HFA) 108 (90 Base) MCG/ACT inhaler Inhale 2 puffs into the lungs every 6 (six) hours as needed for wheezing or shortness of breath (cough). 18 g Lynden Oxford Scales, PA-C   naproxen sodium (ALEVE) 220 MG tablet Take 1 tablet (220 mg total) by mouth 2 (two) times daily as needed for up to 7 days. 14 tablet Lynden Oxford Scales, PA-C      PDMP not reviewed this encounter.  Disposition Upon Discharge:  Patient presented with an acute illness with associated systemic symptoms and significant discomfort requiring urgent management. In my opinion, this is a condition that a prudent lay person (someone who possesses an average knowledge of health and medicine) may potentially expect to result in complications if not addressed urgently such as respiratory distress, impairment of bodily function or dysfunction of bodily organs.   Routine symptom specific, illness specific and/or disease specific instructions were discussed with the patient and/or caregiver at length.   As such, the patient has been evaluated and assessed, work-up was performed and treatment was provided in alignment with urgent care protocols and evidence based medicine.  Patient/parent/caregiver has been advised that the patient may require follow up for further testing and treatment if the symptoms continue in spite of treatment, as clinically indicated and appropriate.  The patient will follow up with their current PCP as previously advised. If the patient does not currently have a PCP we will assist them in obtaining one.    Patient/parent/caregiver verbalized understanding and agreement of plan as discussed.  All questions were addressed during visit.  Please see discharge instructions below for further details of plan.  Condition: stable for discharge home Home: take medications as prescribed; routine discharge instructions as discussed; follow up as  advised.    Lynden Oxford Scales, PA-C 04/22/21 1921

## 2021-04-25 LAB — URINE CULTURE: Culture: 10000 — AB

## 2021-04-25 LAB — CULTURE, GROUP A STREP (THRC)

## 2021-04-27 ENCOUNTER — Ambulatory Visit: Payer: Medicare Other | Admitting: Family Medicine

## 2021-04-27 ENCOUNTER — Other Ambulatory Visit: Payer: Self-pay

## 2021-04-27 ENCOUNTER — Encounter (HOSPITAL_COMMUNITY): Payer: Self-pay | Admitting: Oncology

## 2021-04-27 ENCOUNTER — Emergency Department (HOSPITAL_COMMUNITY): Payer: Medicare Other

## 2021-04-27 ENCOUNTER — Emergency Department (HOSPITAL_COMMUNITY)
Admission: EM | Admit: 2021-04-27 | Discharge: 2021-04-27 | Disposition: A | Discharge status: Home / Self Care | Payer: Medicare Other | Attending: Emergency Medicine | Admitting: Emergency Medicine

## 2021-04-27 DIAGNOSIS — Z96642 Presence of left artificial hip joint: Secondary | ICD-10-CM | POA: Insufficient documentation

## 2021-04-27 DIAGNOSIS — Z7951 Long term (current) use of inhaled steroids: Secondary | ICD-10-CM | POA: Insufficient documentation

## 2021-04-27 DIAGNOSIS — E1142 Type 2 diabetes mellitus with diabetic polyneuropathy: Secondary | ICD-10-CM | POA: Insufficient documentation

## 2021-04-27 DIAGNOSIS — M722 Plantar fascial fibromatosis: Secondary | ICD-10-CM | POA: Insufficient documentation

## 2021-04-27 DIAGNOSIS — J069 Acute upper respiratory infection, unspecified: Secondary | ICD-10-CM | POA: Diagnosis not present

## 2021-04-27 DIAGNOSIS — Z20822 Contact with and (suspected) exposure to covid-19: Secondary | ICD-10-CM | POA: Insufficient documentation

## 2021-04-27 DIAGNOSIS — B9789 Other viral agents as the cause of diseases classified elsewhere: Secondary | ICD-10-CM | POA: Diagnosis not present

## 2021-04-27 DIAGNOSIS — Z7984 Long term (current) use of oral hypoglycemic drugs: Secondary | ICD-10-CM | POA: Insufficient documentation

## 2021-04-27 DIAGNOSIS — I10 Essential (primary) hypertension: Secondary | ICD-10-CM | POA: Insufficient documentation

## 2021-04-27 DIAGNOSIS — Z87891 Personal history of nicotine dependence: Secondary | ICD-10-CM | POA: Insufficient documentation

## 2021-04-27 DIAGNOSIS — M19072 Primary osteoarthritis, left ankle and foot: Secondary | ICD-10-CM | POA: Diagnosis not present

## 2021-04-27 DIAGNOSIS — M7732 Calcaneal spur, left foot: Secondary | ICD-10-CM | POA: Diagnosis not present

## 2021-04-27 DIAGNOSIS — R059 Cough, unspecified: Secondary | ICD-10-CM | POA: Diagnosis not present

## 2021-04-27 DIAGNOSIS — E785 Hyperlipidemia, unspecified: Secondary | ICD-10-CM | POA: Diagnosis not present

## 2021-04-27 DIAGNOSIS — E1169 Type 2 diabetes mellitus with other specified complication: Secondary | ICD-10-CM | POA: Insufficient documentation

## 2021-04-27 DIAGNOSIS — Z6841 Body Mass Index (BMI) 40.0 and over, adult: Secondary | ICD-10-CM | POA: Insufficient documentation

## 2021-04-27 LAB — RESP PANEL BY RT-PCR (FLU A&B, COVID) ARPGX2
Influenza A by PCR: NEGATIVE
Influenza B by PCR: NEGATIVE
SARS Coronavirus 2 by RT PCR: NEGATIVE

## 2021-04-27 MED ORDER — BENZONATATE 100 MG PO CAPS
200.0000 mg | ORAL_CAPSULE | Freq: Once | ORAL | Status: AC
  Administered 2021-04-27: 16:00:00 200 mg via ORAL
  Filled 2021-04-27: qty 2

## 2021-04-27 MED ORDER — BENZONATATE 200 MG PO CAPS: 200.0000 mg | ORAL_CAPSULE | Freq: Three times a day (TID) | ORAL | 0 refills | Status: DC

## 2021-04-27 MED ORDER — ACETAMINOPHEN 500 MG PO TABS
1000.0000 mg | ORAL_TABLET | Freq: Once | ORAL | Status: AC
  Administered 2021-04-27: 16:00:00 1000 mg via ORAL
  Filled 2021-04-27: qty 2

## 2021-04-27 NOTE — ED Triage Notes (Signed)
Pt presents d/t cough x 1 month.  Also c/o left foot pain cramping.

## 2021-04-27 NOTE — ED Provider Notes (Signed)
Emergency Medicine Provider Triage Evaluation Note  Alyssa Montgomery , a 60 y.o. female  was evaluated in triage.  Pt complains of productive cough x yellow sputum onset 2 weeks. Sick contacts at home. Associated symptoms of headache and chills. Tried cough drops and OTC severe sinus meds at home. Given promethazine Rx for cough. Denies chest pain, shortness of breath, or fever.  Left foot pain x this morning. Her left foot pain is worse with the first steps of the day. Foot pain is a throbbing sensation. Denies injury or trauma. Denies swelling or color change.   Review of Systems  Positive: Productive cough, left foot. Negative: Chest pain, shortness of breath  Physical Exam  BP (!) 143/87 (BP Location: Left Arm)   Pulse 89   Temp 98.1 F (36.7 C) (Oral)   Resp 18   SpO2 94%  Gen:   Awake, no distress   Resp:  Normal effort  MSK:   Moves extremities without difficulty  Other:  TTP to medial plantar aspect  Medical Decision Making  Medically screening exam initiated at 1:59 PM.  Appropriate orders placed.  ROCQUEL ASKREN was informed that the remainder of the evaluation will be completed by another provider, this initial triage assessment does not replace that evaluation, and the importance of remaining in the ED until their evaluation is complete.    Dagmawi Venable A, PA-C 04/27/21 1408    Regan Lemming, MD 04/27/21 1450

## 2021-04-27 NOTE — Discharge Instructions (Addendum)
Your left foot xray was negative for fracture or dislocation. You may take a water bottle, fill it halfway, freeze the bottle, then roll it under your left foot as needed. You may take over the counter 600 mg Ibuprofen every 6 hours or 1,000 mg Tylenol every 6 hours as directed. You may apply ice or heat to affected area for up to 15 minutes at a time. Return to the Emergency Department if you are experiencing color change, swelling, unable to walk.  Your COVID and flu swabs were negative today. Take the tessalon as prescribed. Ensure to maintain fluid intake. Return to the Emergency Department if you are experiencing trouble breathing, worsening or increasing chest pain, decreased fluid intake or worsening symptoms.

## 2021-04-27 NOTE — ED Provider Notes (Signed)
[facility] DEPT Provider Note   CSN: [id number] Arrival date & time: 04/27/21  1340     History Chief Complaint  Patient presents with   Cough    Alyssa Montgomery is a 60 y.o. female who presents to the emergency department complaining of productive cough with yellow sputum onset 2 weeks.  Patient reports she has sick contacts at home.  She has associated symptoms of frontal headache, sinus pressure, and chills.  Patient has tried cough drops and over-the-counter severe sinus medication at home with minimal relief of her symptoms.  She was seen in urgent care and given promethazine prescription for her cough with minimal relief.  She denies chest pain, shortness of breath, fever, abdominal pain, nausea, vomiting.   Patient also reports left foot pain onset this morning.  She reports that her left foot pain is worse with the first steps of the day.  She describes her foot pain as a throbbing sensation.  She denies any known injury or trauma.  She denies swelling, color change, or wound.   The history is provided by the patient. No language interpreter was used.      Past Medical History:  Diagnosis Date   Anxiety    Bilateral carpal tunnel syndrome 03/03/2018   Bronchitis    Cervical spondylosis without myelopathy 07/29/2018   Cervicalgia 11/16/2014   Cervicogenic headache 07/29/2018   Chronic bilateral low back pain with bilateral sciatica 09/18/2018   Claudication ([place]) 09/03/2016   Diverticulitis    FIBROIDS, UTERUS 03/09/2010   Qualifier: Diagnosis of  By: Carlean Purl MD, Tonna Boehringer E    Fibromyalgia    Fibromyalgia 06/18/2014   GERD (gastroesophageal reflux disease)    Goiter 02/28/2013   Headache    History of cervical dysplasia 01/18/2016   Hypertension associated with diabetes ([place]) 12/20/2019   Left carpal tunnel syndrome 01/27/2018   Neuropathy    Pain in right ankle and joints of right foot 03/17/2019   Plantar fasciitis    Spinal  stenosis of cervical region 07/29/2018   Substance abuse ([place])    10 years ago ( cocaine)   TIA (transient ischemic attack) 08/10/2016   Tobacco abuse 01/18/2012   Tobacco use disorder 04/17/2015    Patient Active Problem List   Diagnosis Date Noted   Morbid obesity ([place]) 04/27/2021   BMI 38.0-38.9,adult 04/27/2021   Gait abnormality 11/22/2020   Neck pain 11/22/2020   Right leg numbness 11/22/2020   OA (osteoarthritis) of hip 09/12/2020   Primary osteoarthritis of left hip 09/12/2020   Pre-operative clearance 08/12/2020   Peripheral polyneuropathy 05/27/2020   Neuropathy 02/15/2020   Bilateral primary osteoarthritis of hip 02/10/2020   Left foot pain 12/20/2019   Hypertension associated with diabetes ([place]) 12/20/2019   Urinary frequency 11/30/2019   Left hip pain 11/23/2019   Trochanteric bursitis of left hip 11/23/2019   Non-seasonal allergic rhinitis 07/13/2019   Hyperlipidemia associated with type 2 diabetes mellitus ([place]) 11/14/2017   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   Other general symptoms and signs 09/06/2016   Diabetes mellitus without complication ([place]) 96/28/3662   Healthcare maintenance 04/17/2015   History of tobacco use 04/17/2015   Complete tear of right rotator cuff 11/03/2014   UTI (urinary tract infection) 06/19/2012   GERD 12/26/2006    Past Surgical History:  Procedure Laterality Date   ANKLE SURGERY     BACK SURGERY     Bowel obstruction     CARPAL TUNNEL RELEASE  CERVICAL CONIZATION W/BX N/A 12/27/2015   Procedure: CONIZATION CERVIX WITH BIOPSY;  Surgeon: Terrance Mass, MD;  Location: [facility];  Service: Gynecology;  Laterality: N/A;   DILATATION & CURETTAGE/HYSTEROSCOPY WITH MYOSURE N/A 12/27/2015   Procedure: DILATATION & CURETTAGE/HYSTEROSCOPY WITH MYOSURE;  Surgeon: Terrance Mass, MD;  Location: [facility];  Service: Gynecology;  Laterality: N/A;   ECTOPIC PREGNANCY SURGERY     EYE SURGERY     removed right eye;    EYE SURGERY      TOTAL HIP ARTHROPLASTY Left 09/12/2020   Procedure: TOTAL HIP ARTHROPLASTY ANTERIOR APPROACH;  Surgeon: Gaynelle Arabian, MD;  Location: [facility];  Service: Orthopedics;  Laterality: Left;  165min     OB History     Gravida  6   Para  2   Term  2   Preterm      AB  4   Living  2      SAB  2   IAB  1   Ectopic  1   Multiple      Live Births              Family History  Problem Relation Age of Onset   Cancer Father        lung   Hypertension Mother    Arthritis Mother    Other Neg Hx        pituitary problem    Social History   Tobacco Use   Smoking status: Former    Packs/day: 0.50    Years: 18.00    Pack years: 9.00    Types: Cigarettes    Quit date: 04/27/2019    Years since quitting: 2.0   Smokeless tobacco: Never   Tobacco comments:    1-800 number given   Vaping Use   Vaping Use: Never used  Substance Use Topics   Alcohol use: Yes    Alcohol/week: 0.0 standard drinks    Comment: Occasional   Drug use: No    Comment: clean 10 years crack cocaine    Home Medications Prior to Admission medications   Medication Sig Start Date End Date Taking? Authorizing Provider  benzonatate (TESSALON) 200 MG capsule Take 1 capsule (200 mg total) by mouth 3 (three) times daily. 04/27/21  Yes Kristofer Schaffert A, PA-C  acetaminophen (TYLENOL) 500 MG tablet Take 2 tablets (1,000 mg total) by mouth every 8 (eight) hours. 04/22/21 07/21/21  Lynden Oxford Scales, PA-C  albuterol (VENTOLIN HFA) 108 (90 Base) MCG/ACT inhaler Inhale 2 puffs into the lungs every 6 (six) hours as needed for wheezing or shortness of breath (cough). 04/22/21   Lynden Oxford Scales, PA-C  cephALEXin (KEFLEX) 500 MG capsule Take 1 capsule (500 mg total) by mouth 4 (four) times daily for 7 days. 04/22/21 04/29/21  Lynden Oxford Scales, PA-C  fluticasone (FLONASE) 50 MCG/ACT nasal spray Place 1 spray into both nostrils daily. 04/22/21 05/22/21  Lynden Oxford Scales, PA-C  ipratropium  (ATROVENT) 0.06 % nasal spray Place 2 sprays into both nostrils 4 (four) times daily. 04/22/21   Lynden Oxford Scales, PA-C  metFORMIN (GLUCOPHAGE-XR) 500 MG 24 hr tablet Take 2 tablets (1,000 mg total) by mouth daily with breakfast. 01/17/21 04/17/21  Ezequiel Essex, MD  naproxen sodium (ALEVE) 220 MG tablet Take 1 tablet (220 mg total) by mouth 2 (two) times daily as needed for up to 7 days. 04/22/21 04/29/21  Lynden Oxford Scales, PA-C  pravastatin (PRAVACHOL) 20 MG tablet TAKE 1 TABLET BY MOUTH EVERYDAY  AT BEDTIME 04/17/21   Ezequiel Essex, MD  promethazine-dextromethorphan (PROMETHAZINE-DM) 6.25-15 MG/5ML syrup Take 5 mLs by mouth 4 (four) times daily as needed for up to 9 days for cough. 04/22/21 05/01/21  Lynden Oxford Scales, PA-C    Allergies    Zanaflex [tizanidine hcl], Penicillins, and Sulfonamide derivatives  Review of Systems   Review of Systems  Constitutional:  Positive for chills. Negative for fever.  HENT:  Positive for sinus pressure. Negative for congestion and rhinorrhea.   Respiratory:  Positive for cough. Negative for shortness of breath.   Cardiovascular:  Negative for chest pain.  Gastrointestinal:  Negative for abdominal pain, nausea and vomiting.  Musculoskeletal:  Positive for arthralgias. Negative for joint swelling.  Skin:  Negative for color change and wound.  Neurological:  Positive for headaches. Negative for dizziness and light-headedness.  All other systems reviewed and are negative.  Physical Exam Updated Vital Signs BP (!) 143/87 (BP Location: Left Arm)   Pulse 89   Temp 98.1 F (36.7 C) (Oral)   Resp 18   Ht 5\' 5"  (1.651 m)   Wt 102.1 kg   SpO2 94%   BMI 37.44 kg/m   Physical Exam Vitals and nursing note reviewed.  Constitutional:      General: She is not in acute distress.    Appearance: She is not diaphoretic.  HENT:     Head: Normocephalic and atraumatic.     Comments: Maxillary sinus tenderness to palpation.  No frontal sinus  tenderness.     Right Ear: Tympanic membrane, ear canal and external ear normal.     Left Ear: Tympanic membrane, ear canal and external ear normal.     Nose: Nose normal.     Mouth/Throat:     Mouth: Mucous membranes are moist.     Pharynx: Oropharynx is clear. No oropharyngeal exudate.  Eyes:     General: No scleral icterus.    Conjunctiva/sclera: Conjunctivae normal.  Cardiovascular:     Rate and Rhythm: Normal rate and regular rhythm.     Pulses: Normal pulses.     Heart sounds: Normal heart sounds.  Pulmonary:     Effort: Pulmonary effort is normal. No respiratory distress.     Breath sounds: Normal breath sounds. No wheezing.  Abdominal:     General: Bowel sounds are normal.     Palpations: Abdomen is soft. There is no mass.     Tenderness: There is no abdominal tenderness. There is no guarding or rebound.  Musculoskeletal:        General: Normal range of motion.     Cervical back: Normal range of motion and neck supple.     Right foot: Normal.     Left foot: Normal range of motion and normal capillary refill. Tenderness present. No swelling, deformity, laceration or bony tenderness. Normal pulse.     Comments: TTP to medial aspect of left plantar surface.  Full active range of motion of bilateral ankles.  DP and PT pulses intact bilaterally.  Skin:    General: Skin is warm and dry.     Capillary Refill: Capillary refill takes less than 2 seconds.  Neurological:     Mental Status: She is alert.     Gait: Gait normal.  Psychiatric:        Behavior: Behavior normal.    ED Results / Procedures / Treatments   Labs (all labs ordered are listed, but only abnormal results are displayed) Labs Reviewed  RESP PANEL BY RT-PCR (  FLU A&B, COVID) [id number]    EKG None  Radiology DG Chest 2 View  Result Date: 04/27/2021 CLINICAL DATA:  Cough EXAM: CHEST - 2 VIEW COMPARISON:  Radiograph 12/05/2020 FINDINGS: Unchanged cardiomediastinal silhouette. No focal airspace consolidation.  No pleural effusion or visible pneumothorax. There is no acute osseous abnormality. Thoracic spondylosis. IMPRESSION: No evidence of acute cardiopulmonary disease. Electronically Signed   By: Maurine Simmering M.D.   On: 04/27/2021 14:52   DG Foot Complete Left  Result Date: 04/27/2021 CLINICAL DATA:  Foot pain EXAM: LEFT FOOT - COMPLETE 3+ VIEW COMPARISON:  None. FINDINGS: There is no evidence of acute fracture or dislocation. There is mild first MTP and mild midfoot degenerative change. Plantar calcaneal spur. IMPRESSION: No acute osseous abnormality. Mild midfoot and first MTP degenerative change. Electronically Signed   By: Maurine Simmering M.D.   On: 04/27/2021 14:53    Procedures Procedures   Medications Ordered in ED Medications  benzonatate (TESSALON) capsule 200 mg (200 mg Oral Given 04/27/21 1546)  acetaminophen (TYLENOL) tablet 1,000 mg (1,000 mg Oral Given 04/27/21 1546)    ED Course  I have reviewed the triage vital signs and the nursing notes.  Pertinent labs & imaging results that were available during my care of the patient were reviewed by me and considered in my medical decision making (see chart for details).    MDM Rules/Calculators/A&P                           Patient with cough times yellow sputum onset 2 weeks.  She has sick contacts at home.  Vital signs stable, patient afebrile and oxygen saturation 94%. Differential diagnosis includes COVID, Flu, viral URI. On exam patient with lungs clear to auscultation bilaterally otherwise cardiovascular, pulmonary, abdominal exam without acute findings.  Patient given dose of Tessalon in the ED for cough.   Patient also given Tylenol for headache.  Swabbed for COVID, flu.  Results showed negative for COVID and flu. Discussed with patient findings.  Thoroughly discussed with patient the duration of a cough following viral illness.  Patient provided with Tessalon prescription.  Discussed with patient that due to sinus pressure and  congestion only occurring for 2-3 days, no need for antibiotics at this time as this is likely viral etiology.  Patient acknowledges and verbalizes understanding.  Patient with left foot pain x this morning. Her left foot pain is worse with the first steps of the day. On exam, patient with TTP to left medial plantar surface. Sensation and pulses intact bilaterally to lower extremities.  Differential diagnosis includes plantar fasciitis, stress fracture, osteoarthritis.  Left foot x-ray negative for acute fractures or dislocations.  Less likely osteoarthritis due to negative x-ray.  Patient afebrile and vital signs within normal limits.  Patient presentation is suspicious for plantar fasciitis.  Discussed with patient use of frozen water bottle and NSAIDs to alleviate symptoms. Strict return precautions discussed with patient including increasing or worsening foot pain, fever, color change, swelling.    Supportive care and return precautions discussed with patient.  Patient acknowledges and voices understanding.  Patient appears safe for discharge at this time. Follow-up as indicated in discharge paperwork.   Final Clinical Impression(s) / ED Diagnoses Final diagnoses:  Viral URI with cough  Plantar fasciitis    Rx / DC Orders ED Discharge Orders          Ordered    benzonatate (TESSALON) 200 MG capsule  3 times  daily        04/27/21 1544             Ercell Perlman A, PA-C 04/27/21 1733    Regan Lemming, MD 04/28/21 [phone]

## 2021-04-27 NOTE — Progress Notes (Deleted)
Cervical MRI 07/06/18 IMPRESSION: 1. C5-6 moderate spinal canal stenosis with mild right and severe left neural foraminal stenosis. 2. C4-5 mild spinal canal stenosis and moderate right neural foraminal stenosis. 3. C3-4 mild spinal canal stenosis.  03/03/18 EMG BUE: bilateral carpal tunnel of mild to moderate severity bilaterally.  EMG or LUE did not hsow evidence of overlying cervical radiculopathy  06/17/19 [facility] (Dr Brien Few, PM&R) performed motor nerve conduction test possible right lower extremity photocopy in Chart Review.  Unable to tell the final impression, but the test report document legend indicates abnormalities would be circles and there are no circled measurements.   04/28/21 [facility] office visit Dr Jeannine Kitten "Pt with decreased sensation on the feet and paresthesia of the fingers and toes.  Strength intact.  Lumbar spinal MRI showed mild spondylosis and spinal stenosis with degenerative facet changes.  Will refer to neurosurgery given the decreased sensation on exam .  Office visit Dec-17-2021 CC back painwith Emelda Brothers (Neurosurgeon) of [facility] "Mrs. Frost is a 60 year old woman with a 1-2 month history of LLE then BLE foot numbness. She also has very severe left hip pain and is supposed to get a hip replacement pending weight loss. She woke with numbness in left foot, the entire foot, no preceding symptoms. She then noted that this used over few weeks and it appeared in the right foot. On the right, it is dysesthetic and a burning pain, the pain is sharp in quality, severe intensity, occurs with any touching or manipulation of the foot, no palliating features. No change in bowel or bladder function, no more proximal pain, no area other areas of numbness or paresthesias, no symptoms in hands, has not had symptoms like this before. "  Office visit note Dr Krista Blue (Neuro, [place]) 6/06/14/20 "She reported 1 episode of sudden onset bilateral lower  extremity numbness from waist down in January 2022, wake up from overnight sleep noticed dense numbness involving bilateral lower extremity, left side lasting for 1 week, gradually improved to baseline, but right side had a persistent numbness, it has receding from right hip to right mid shin area, but she has more persistent dense right foot numbness, she could not feel her right foot sometimes, she did not even aware a deep cut on her right toe."  Assessment: right leg numbness, gait abnormality, chronic worsening neck pain Plan: r/o cervical spondylitic myelopathy with Cervical MRI abdomin EMG nerve conduction study, Rx gabapentin 300 mg tab, titrating to tid  60 year old woman  03/15/21 pUC visit patient CC: bilateral foot pain and started on Mobic.   04/22/21 UC visit for cough and sore throat.  Also complaining of numb right arm and bilateral feet for 2 weeks, left side back pain with rad into left leg for 4 to 5 weeks.

## 2021-04-29 ENCOUNTER — Other Ambulatory Visit: Payer: Self-pay | Admitting: Orthopaedic Surgery

## 2021-04-29 ENCOUNTER — Other Ambulatory Visit: Payer: Self-pay | Admitting: Family Medicine

## 2021-04-29 DIAGNOSIS — M545 Low back pain, unspecified: Secondary | ICD-10-CM

## 2021-04-29 DIAGNOSIS — J3089 Other allergic rhinitis: Secondary | ICD-10-CM

## 2021-05-01 NOTE — Telephone Encounter (Signed)
Have not seen this pt in a long time-had hip replacement by Dr Maureen Ralphs in [place] to the naproxen request

## 2021-05-03 ENCOUNTER — Ambulatory Visit (AMBULATORY_SURGERY_CENTER): Payer: Medicare Other

## 2021-05-03 ENCOUNTER — Other Ambulatory Visit: Payer: Self-pay

## 2021-05-03 VITALS — Ht 64.0 in | Wt 225.0 lb

## 2021-05-03 DIAGNOSIS — Z1211 Encounter for screening for malignant neoplasm of colon: Secondary | ICD-10-CM

## 2021-05-03 MED ORDER — PEG 3350-KCL-NA BICARB-NACL 420 G PO SOLR
4000.0000 mL | Freq: Once | ORAL | 0 refills | Status: AC
Start: 2021-05-03 — End: 2021-05-03

## 2021-05-03 NOTE — Progress Notes (Signed)
    Patient's pre-visit was done today over the phone with the patient   Name,DOB and address verified.   Patient denies any allergies to Eggs and Soy.  Patient denies any problems with anesthesia/sedation. Patient denies taking diet pills or blood thinners.  Denies atrial flutter or atrial fib Denies chronic constipation No home Oxygen.   Packet of Prep instructions mailed to patient including a copy of a consent form-pt is aware.  Patient understands to call us back with any questions or concerns.  Patient is aware of our care-partner policy and [id number] safety protocol.    The patient is COVID-19 vaccinated.    Pt changed mind during prep instructions requesting Golytely.  Prep letter was changed.

## 2021-05-06 ENCOUNTER — Other Ambulatory Visit: Payer: Self-pay | Admitting: Orthopaedic Surgery

## 2021-05-06 DIAGNOSIS — M545 Low back pain, unspecified: Secondary | ICD-10-CM

## 2021-05-07 DIAGNOSIS — M5451 Vertebrogenic low back pain: Secondary | ICD-10-CM | POA: Diagnosis not present

## 2021-05-07 NOTE — Telephone Encounter (Signed)
No refill

## 2021-05-08 NOTE — Telephone Encounter (Signed)
thanks

## 2021-05-12 ENCOUNTER — Telehealth: Payer: Self-pay | Admitting: Internal Medicine

## 2021-05-12 NOTE — Telephone Encounter (Signed)
Patient very concerned about Golytely prep causing seizures and wondering if she should switch to the Miralax/Gatorade/Dulcolax.  Please call.

## 2021-05-12 NOTE — Telephone Encounter (Signed)
Pt wants to RS colon as she is sick and thinks she should post pone  RS to 1-19 Thursday at 130 pm with Carlean Purl- new Golytely instructions to pt  We discussed seizure risk low with any prep as long as she hydrates properly WED . THURS am- pt verbalized understanding- will keep Golytely prep

## 2021-05-15 DIAGNOSIS — M5417 Radiculopathy, lumbosacral region: Secondary | ICD-10-CM | POA: Diagnosis not present

## 2021-05-15 DIAGNOSIS — M5451 Vertebrogenic low back pain: Secondary | ICD-10-CM | POA: Diagnosis not present

## 2021-05-15 DIAGNOSIS — G8929 Other chronic pain: Secondary | ICD-10-CM | POA: Diagnosis not present

## 2021-05-18 ENCOUNTER — Encounter: Payer: Medicare Other | Admitting: Internal Medicine

## 2021-05-31 ENCOUNTER — Encounter: Payer: Self-pay | Admitting: Family Medicine

## 2021-05-31 ENCOUNTER — Other Ambulatory Visit: Payer: Self-pay

## 2021-05-31 ENCOUNTER — Ambulatory Visit (INDEPENDENT_AMBULATORY_CARE_PROVIDER_SITE_OTHER): Payer: Medicare Other | Admitting: Family Medicine

## 2021-05-31 VITALS — BP 141/86 | HR 69 | Ht 64.0 in | Wt 234.5 lb

## 2021-05-31 DIAGNOSIS — J3089 Other allergic rhinitis: Secondary | ICD-10-CM

## 2021-05-31 DIAGNOSIS — R300 Dysuria: Secondary | ICD-10-CM

## 2021-05-31 DIAGNOSIS — E119 Type 2 diabetes mellitus without complications: Secondary | ICD-10-CM | POA: Diagnosis not present

## 2021-05-31 DIAGNOSIS — R059 Cough, unspecified: Secondary | ICD-10-CM | POA: Diagnosis not present

## 2021-05-31 DIAGNOSIS — N3001 Acute cystitis with hematuria: Secondary | ICD-10-CM | POA: Diagnosis not present

## 2021-05-31 DIAGNOSIS — J069 Acute upper respiratory infection, unspecified: Secondary | ICD-10-CM

## 2021-05-31 DIAGNOSIS — M75121 Complete rotator cuff tear or rupture of right shoulder, not specified as traumatic: Secondary | ICD-10-CM

## 2021-05-31 DIAGNOSIS — M169 Osteoarthritis of hip, unspecified: Secondary | ICD-10-CM

## 2021-05-31 LAB — POCT GLYCOSYLATED HEMOGLOBIN (HGB A1C): HbA1c, POC (controlled diabetic range): 6.7 % (ref 0.0–7.0)

## 2021-05-31 LAB — POCT URINALYSIS DIP (MANUAL ENTRY)
Bilirubin, UA: NEGATIVE
Blood, UA: NEGATIVE
Glucose, UA: NEGATIVE mg/dL
Ketones, POC UA: NEGATIVE mg/dL
Leukocytes, UA: NEGATIVE
Nitrite, UA: NEGATIVE
Spec Grav, UA: 1.025 (ref 1.010–1.025)
Urobilinogen, UA: 0.2 E.U./dL
pH, UA: 5.5 (ref 5.0–8.0)

## 2021-05-31 LAB — POCT INFLUENZA A/B
Influenza A, POC: NEGATIVE
Influenza B, POC: NEGATIVE

## 2021-05-31 MED ORDER — NAPROXEN 500 MG PO TABS
500.0000 mg | ORAL_TABLET | Freq: Two times a day (BID) | ORAL | 0 refills | Status: DC
Start: 2021-05-31 — End: 2021-11-16

## 2021-05-31 MED ORDER — BENZONATATE 200 MG PO CAPS
200.0000 mg | ORAL_CAPSULE | Freq: Three times a day (TID) | ORAL | 0 refills | Status: DC
Start: 2021-05-31 — End: 2022-02-26

## 2021-05-31 MED ORDER — GUAIFENESIN ER 600 MG PO TB12
600.0000 mg | ORAL_TABLET | Freq: Two times a day (BID) | ORAL | 2 refills | Status: DC | PRN
Start: 2021-05-31 — End: 2022-10-04

## 2021-05-31 MED ORDER — ALBUTEROL SULFATE HFA 108 (90 BASE) MCG/ACT IN AERS: INHALATION_SPRAY | RESPIRATORY_TRACT | 2 refills | Status: DC

## 2021-05-31 NOTE — Patient Instructions (Addendum)
It was wonderful to see you today. Thank you for allowing me to be a part of your care. Below is a short summary of what we discussed at your visit today:  Cough, rib pain from coughing - tessalon perls as needed to relieve cough - mucinex tablets up to twice daily as needed to loosen phlegm - continue using honey, ginger, drinking pineapple juice - tylenol for uncomfortable fever, chills, rigors - continue to take in plenty of fluids and get rest  Your flu test was negative.   Dysuria - Your urine study came back negative for infection    Please bring all of your medications to every appointment!  If you have any questions or concerns, please do not hesitate to contact us via phone or MyChart message.   Ezequiel Essex, MD

## 2021-05-31 NOTE — Progress Notes (Signed)
SUBJECTIVE:   CHIEF COMPLAINT / HPI:   Viral URI - seen in ED 11/17, dx viral URI with cough - has continued to have cough and congestion, one month later - continued cough, body aches - had tried OTC cold/flu, cough drops without relief - husband gave her his doxycycline that was prescribed to him by Optho for some sort of eye infection, with some improvement - also took tessalon perls from friend with good relief - coughing up thick mucous - has rib pain from prolonged coughing - experiencing coughing spells - no fever ever - hx smoking for 45 years, 1/2 PPD = 23 pack-years, quit Apr 27, 2019  Dysuria - was given abx several weeks ago for UTI - wonders if this is cleared - still having some suprapubic discomfort - UA unremarkable aside from trace protein  PERTINENT  PMH / PSH:  Patient Active Problem List   Diagnosis Date Noted   Morbid obesity ([place]) 04/27/2021   BMI 40.0-44.9, adult ([place]) 04/27/2021   Peripheral polyneuropathy 05/27/2020   Neuropathy 02/15/2020   Bilateral primary osteoarthritis of hip 02/10/2020   Hypertension associated with diabetes ([place]) 12/20/2019   Hyperlipidemia associated with type 2 diabetes mellitus ([place]) 11/14/2017   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   Diabetes mellitus without complication ([place]) 17/51/0258   Healthcare maintenance 04/17/2015   History of tobacco use 04/17/2015   Complete tear of right rotator cuff 11/03/2014   Viral URI with cough 02/28/2013   UTI (urinary tract infection) 06/19/2012   GERD 12/26/2006     OBJECTIVE:   BP (!) 141/86    Pulse 69    Ht 5\' 4"  (1.626 m)    Wt 234 lb 8 oz (106.4 kg)    SpO2 100%    BMI 40.25 kg/m    PHQ-9:  Depression screen [facility] 2/9 05/31/2021 01/17/2021 10/12/2020  Decreased Interest 0 0 0  Down, Depressed, Hopeless 0 0 0  PHQ - 2 Score 0 0 0  Altered sleeping 2 1 1   Tired, decreased energy 0 1 1  Change in appetite 0 0 0  Feeling bad or failure about yourself  0 0 0  Trouble  concentrating 0 0 0  Moving slowly or fidgety/restless 0 0 0  Suicidal thoughts 0 0 0  PHQ-9 Score 2 2 2   Difficult doing work/chores Not difficult at all Not difficult at all -  Some recent data might be hidden    GAD-7:  GAD 7 : Generalized Anxiety Score 01/27/2018  Nervous, Anxious, on Edge 1  Control/stop worrying 1  Worry too much - different things 1  Trouble relaxing 1  Restless 1  Easily annoyed or irritable 2  Afraid - awful might happen 0  Total GAD 7 Score 7    Physical Exam General: Awake, alert, oriented Cardiovascular: Regular rate and rhythm, S1 and S2 present, no murmurs auscultated Respiratory: LUL posterior wheezing, transmitted upper airway congestion  ASSESSMENT/PLAN:   Viral URI with cough Persistent cough x1 month.  Diagnosed viral URI in ED 04/27/2021.  Red flag symptoms today or physical exam findings to suggest reinfection or superinfection of bacterial etiology.  Discussed with patient, reassured.  Rapid flu A/B is negative.  COVID swab sent, will await results. Patient requests refill of her albuterol inhaler to help with wheezing.  Rx Tessalon Perles and Mucinex, recommended other conservative OTC management.  See AVS for more.  UTI (urinary tract infection) Continued dysuria.  Given antibiotic for UTI several weeks ago, patient  wonders if UTI has cleared.  UA today unremarkable, no evidence of current infection.  Discussed with patient.  No treatment indicated.     Ezequiel Essex, MD [place]

## 2021-06-01 LAB — NOVEL CORONAVIRUS, NAA: SARS-CoV-2, NAA: NOT DETECTED

## 2021-06-01 LAB — SARS-COV-2, NAA 2 DAY TAT

## 2021-06-02 ENCOUNTER — Encounter: Payer: Self-pay | Admitting: Family Medicine

## 2021-06-03 ENCOUNTER — Encounter: Payer: Self-pay | Admitting: Family Medicine

## 2021-06-03 NOTE — Assessment & Plan Note (Addendum)
Persistent cough x1 month.  Diagnosed viral URI in ED 04/27/2021.  Red flag symptoms today or physical exam findings to suggest reinfection or superinfection of bacterial etiology.  Discussed with patient, reassured.  Rapid flu A/B is negative.  COVID swab sent, will await results. Patient requests refill of her albuterol inhaler to help with wheezing.  Rx Tessalon Perles and Mucinex, recommended other conservative OTC management.  See AVS for more.

## 2021-06-03 NOTE — Assessment & Plan Note (Signed)
Continued dysuria.  Given antibiotic for UTI several weeks ago, patient wonders if UTI has cleared.  UA today unremarkable, no evidence of current infection.  Discussed with patient.  No treatment indicated.

## 2021-06-22 ENCOUNTER — Telehealth: Payer: Self-pay | Admitting: *Deleted

## 2021-06-22 DIAGNOSIS — R052 Subacute cough: Secondary | ICD-10-CM

## 2021-06-22 DIAGNOSIS — J988 Other specified respiratory disorders: Secondary | ICD-10-CM

## 2021-06-22 MED ORDER — IPRATROPIUM BROMIDE 0.06 % NA SOLN: 2.0000 | Freq: Four times a day (QID) | NASAL | 12 refills | Status: DC

## 2021-06-22 MED ORDER — DEXTROMETHORPHAN HBR 15 MG/5ML PO SYRP: 10.0000 mL | ORAL_SOLUTION | Freq: Four times a day (QID) | ORAL | 0 refills | Status: DC | PRN

## 2021-06-22 NOTE — Telephone Encounter (Signed)
Pt calling wanting medicine call in for her cough and wants dr to call her about it. Please advise. Alyssa Montgomery Kennon Holter, CMA

## 2021-06-22 NOTE — Telephone Encounter (Signed)
Called patient. Scheduled ATC appointment for Monday morning. Sent in cough medication and nasal spray as requested. Encouraged patient to keep Monday appointment as she likely needs re-evaluation and referral to Dr. Valentina Lucks for PFTs, as I'm concerned she has developed COPD given her long smoking history. COPD would help explain the chronic cough since Thanksgiving with intermittent sputum production.   Ezequiel Essex, MD

## 2021-06-26 ENCOUNTER — Ambulatory Visit (INDEPENDENT_AMBULATORY_CARE_PROVIDER_SITE_OTHER): Payer: Medicare Other | Admitting: Family Medicine

## 2021-06-26 DIAGNOSIS — Z91199 Patient's noncompliance with other medical treatment and regimen due to unspecified reason: Secondary | ICD-10-CM

## 2021-06-26 NOTE — Progress Notes (Signed)
No show for appt. Will let PCP know.

## 2021-06-27 ENCOUNTER — Encounter: Payer: Self-pay | Admitting: Family Medicine

## 2021-06-27 NOTE — Progress Notes (Signed)
No show letter to patient. Missed 06/26/21.   Ezequiel Essex, MD

## 2021-06-29 ENCOUNTER — Encounter: Payer: Self-pay | Admitting: Internal Medicine

## 2021-06-29 ENCOUNTER — Encounter: Payer: Medicare Other | Admitting: Internal Medicine

## 2021-06-29 ENCOUNTER — Ambulatory Visit (AMBULATORY_SURGERY_CENTER): Payer: Medicare Other | Admitting: Internal Medicine

## 2021-06-29 ENCOUNTER — Other Ambulatory Visit: Payer: Self-pay

## 2021-06-29 ENCOUNTER — Telehealth: Payer: Self-pay | Admitting: Internal Medicine

## 2021-06-29 VITALS — BP 144/79 | HR 65 | Temp 97.1°F | Resp 13 | Ht 64.0 in | Wt 225.0 lb

## 2021-06-29 DIAGNOSIS — Z1211 Encounter for screening for malignant neoplasm of colon: Secondary | ICD-10-CM | POA: Diagnosis not present

## 2021-06-29 DIAGNOSIS — M797 Fibromyalgia: Secondary | ICD-10-CM | POA: Diagnosis not present

## 2021-06-29 MED ORDER — SODIUM CHLORIDE 0.9 % IV SOLN: 500.0000 mL | Freq: Once | INTRAVENOUS | Status: DC

## 2021-06-29 NOTE — Telephone Encounter (Signed)
Would have to know what symptoms she is having that make her want to do an EGD?  Most likely would need an office vist w/ me or an APP but you can ask why she wants one

## 2021-06-29 NOTE — Telephone Encounter (Signed)
Good Morning Dr. Carlean Purl,   Patient had a Colonoscopy done with you this morning. She called wanting to schedule a Endoscopy with you.   Seeking advice if she can be scheduled for procedure. Please advise.

## 2021-06-29 NOTE — Patient Instructions (Addendum)
No polyps or cancer were seen.  You do have diverticulosis - thickened muscle rings and pouches in the colon wall. Please read the handout about this condition.  Next routine colonoscopy or other screening test in 10 years - 2033.  I appreciate the opportunity to care for you. Gatha Mayer, MD, FACG  YOU HAD AN ENDOSCOPIC PROCEDURE TODAY AT [facility]:   Refer to the procedure report that was given to you for any specific questions about what was found during the examination.  If the procedure report does not answer your questions, please call your gastroenterologist to clarify.  If you requested that your care partner not be given the details of your procedure findings, then the procedure report has been included in a sealed envelope for you to review at your convenience later.  YOU SHOULD EXPECT: Some feelings of bloating in the abdomen. Passage of more gas than usual.  Walking can help get rid of the air that was put into your GI tract during the procedure and reduce the bloating. If you had a lower endoscopy (such as a colonoscopy or flexible sigmoidoscopy) you may notice spotting of blood in your stool or on the toilet paper. If you underwent a bowel prep for your procedure, you may not have a normal bowel movement for a few days.  Please Note:  You might notice some irritation and congestion in your nose or some drainage.  This is from the oxygen used during your procedure.  There is no need for concern and it should clear up in a day or so.  SYMPTOMS TO REPORT IMMEDIATELY:  Following lower endoscopy (colonoscopy or flexible sigmoidoscopy):  Excessive amounts of blood in the stool  Significant tenderness or worsening of abdominal pains  Swelling of the abdomen that is new, acute  Fever of 100F or higher   For urgent or emergent issues, a gastroenterologist can be reached at any hour by calling [phone]. Do not use MyChart messaging for urgent concerns.     DIET:  We do recommend a small meal at first, but then you may proceed to your regular diet.  Drink plenty of fluids but you should avoid alcoholic beverages for 24 hours.  ACTIVITY:  You should plan to take it easy for the rest of today and you should NOT DRIVE or use heavy machinery until tomorrow (because of the sedation medicines used during the test).    FOLLOW UP: Our staff will call the number listed on your records 48-72 hours following your procedure to check on you and address any questions or concerns that you may have regarding the information given to you following your procedure. If we do not reach you, we will leave a message.  We will attempt to reach you two times.  During this call, we will ask if you have developed any symptoms of COVID 19. If you develop any symptoms (ie: fever, flu-like symptoms, shortness of breath, cough etc.) before then, please call [phone].  If you test positive for Covid 19 in the 2 weeks post procedure, please call and report this information to [place].    If any biopsies were taken you will be contacted by phone or by letter within the next 1-3 weeks.  Please call us at [phone] if you have not heard about the biopsies in 3 weeks.    SIGNATURES/CONFIDENTIALITY: You and/or your care partner have signed paperwork which will be entered into your electronic medical record.  These  signatures attest to the fact that that the information above on your After Visit Summary has been reviewed and is understood.  Full responsibility of the confidentiality of this discharge information lies with you and/or your care-partner.

## 2021-06-29 NOTE — Progress Notes (Signed)
Pt's states no medical or surgical changes since previsit or office visit. 

## 2021-06-29 NOTE — Progress Notes (Signed)
[facility] History and Physical   Primary Care Physician:  Ezequiel Essex, MD   Reason for Procedure:   Colon cancer screening  Plan:    colonoscopy     HPI: Alyssa Montgomery is a 61 y.o. female here for colon cancer screening w/ colonoscopy   Past Medical History:  Diagnosis Date   Anxiety    Bilateral carpal tunnel syndrome 03/03/2018   Bronchitis    Cervical spondylosis without myelopathy 07/29/2018   Cervicalgia 11/16/2014   Cervicogenic headache 07/29/2018   Chronic bilateral low back pain with bilateral sciatica 09/18/2018   Claudication ([place]) 09/03/2016   Diverticulitis    FIBROIDS, UTERUS 03/09/2010   Qualifier: Diagnosis of  By: Carlean Purl MD, Tonna Boehringer E    Fibromyalgia    Fibromyalgia 06/18/2014   Gait abnormality 11/22/2020   GERD (gastroesophageal reflux disease)    Goiter 02/28/2013   Headache    History of cervical dysplasia 01/18/2016   Hyperlipidemia    Hypertension associated with diabetes ([place]) 12/20/2019   Left carpal tunnel syndrome 01/27/2018   Left foot pain 12/20/2019   Left hip pain 11/23/2019   Neck pain 11/22/2020   Neuropathy    Non-seasonal allergic rhinitis 07/13/2019   Pain in right ankle and joints of right foot 03/17/2019   Plantar fasciitis    Pre-operative clearance 08/12/2020   Primary osteoarthritis of left hip 09/12/2020   Right leg numbness 11/22/2020   Spinal stenosis of cervical region 07/29/2018   Substance abuse ([place])    10 years ago ( cocaine)   TIA (transient ischemic attack) 08/10/2016   Tobacco abuse 01/18/2012   Tobacco use disorder 04/17/2015   Trochanteric bursitis of left hip 11/23/2019   Urinary frequency 11/30/2019   UTI (urinary tract infection) 06/19/2012    Past Surgical History:  Procedure Laterality Date   ANKLE SURGERY     BACK SURGERY     Bowel obstruction     CARPAL TUNNEL RELEASE     CERVICAL CONIZATION W/BX N/A 12/27/2015   Procedure: CONIZATION CERVIX WITH BIOPSY;  Surgeon: Terrance Mass,  MD;  Location: [facility];  Service: Gynecology;  Laterality: N/A;   DILATATION & CURETTAGE/HYSTEROSCOPY WITH MYOSURE N/A 12/27/2015   Procedure: DILATATION & CURETTAGE/HYSTEROSCOPY WITH MYOSURE;  Surgeon: Terrance Mass, MD;  Location: [facility];  Service: Gynecology;  Laterality: N/A;   ECTOPIC PREGNANCY SURGERY     EYE SURGERY     removed right eye;    EYE SURGERY     TOTAL HIP ARTHROPLASTY Left 09/12/2020   Procedure: TOTAL HIP ARTHROPLASTY ANTERIOR APPROACH;  Surgeon: Gaynelle Arabian, MD;  Location: [facility];  Service: Orthopedics;  Laterality: Left;  156min    Prior to Admission medications   Medication Sig Start Date End Date Taking? Authorizing Provider  acetaminophen (TYLENOL) 500 MG tablet Take 2 tablets (1,000 mg total) by mouth every 8 (eight) hours. 04/22/21 07/21/21 Yes Lynden Oxford Scales, PA-C  fluticasone (FLONASE) 50 MCG/ACT nasal spray PLACE 1 SPRAY INTO BOTH NOSTRILS DAILY AS NEEDED FOR ALLERGIES OR RHINITIS. 05/02/21  Yes Ezequiel Essex, MD  metFORMIN (GLUCOPHAGE-XR) 500 MG 24 hr tablet Take 2 tablets (1,000 mg total) by mouth daily with breakfast. 01/17/21 06/29/21 Yes Ezequiel Essex, MD  pravastatin (PRAVACHOL) 20 MG tablet TAKE 1 TABLET BY MOUTH EVERYDAY AT BEDTIME 04/17/21  Yes Ezequiel Essex, MD  albuterol (VENTOLIN HFA) 108 (90 Base) MCG/ACT inhaler INHALE 2 PUFFS INTO THE LUNGS EVERY 6 HOURS AS NEEDED FOR WHEEZING/SHORTNESS OF BREATH 05/31/21  Ezequiel Essex, MD  benzonatate (TESSALON) 200 MG capsule Take 1 capsule (200 mg total) by mouth 3 (three) times daily. Patient not taking: Reported on 06/29/2021 05/31/21   Ezequiel Essex, MD  dextromethorphan 15 MG/5ML syrup Take 10 mLs (30 mg total) by mouth 4 (four) times daily as needed for cough. Patient not taking: Reported on 06/29/2021 06/22/21   Ezequiel Essex, MD  GABAPENTIN PO Take by mouth. Patient not taking: Reported on 06/29/2021    [provider]  guaiFENesin (MUCINEX) 600 MG 12 hr tablet Take 1 tablet (600  mg total) by mouth 2 (two) times daily as needed for to loosen phlegm or cough. Patient not taking: Reported on 06/29/2021 05/31/21   Ezequiel Essex, MD  ipratropium (ATROVENT) 0.06 % nasal spray Place 2 sprays into both nostrils 4 (four) times daily. 06/22/21   Ezequiel Essex, MD  meloxicam (MOBIC) 15 MG tablet Take 15 mg by mouth daily. 06/19/21   [provider]  naproxen (NAPROSYN) 500 MG tablet Take 1 tablet (500 mg total) by mouth 2 (two) times daily with a meal. 05/31/21   Ezequiel Essex, MD    Current Outpatient Medications  Medication Sig Dispense Refill   acetaminophen (TYLENOL) 500 MG tablet Take 2 tablets (1,000 mg total) by mouth every 8 (eight) hours. 180 tablet 2   fluticasone (FLONASE) 50 MCG/ACT nasal spray PLACE 1 SPRAY INTO BOTH NOSTRILS DAILY AS NEEDED FOR ALLERGIES OR RHINITIS. 48 mL 2   metFORMIN (GLUCOPHAGE-XR) 500 MG 24 hr tablet Take 2 tablets (1,000 mg total) by mouth daily with breakfast. 180 tablet 3   pravastatin (PRAVACHOL) 20 MG tablet TAKE 1 TABLET BY MOUTH EVERYDAY AT BEDTIME 90 tablet 1   albuterol (VENTOLIN HFA) 108 (90 Base) MCG/ACT inhaler INHALE 2 PUFFS INTO THE LUNGS EVERY 6 HOURS AS NEEDED FOR WHEEZING/SHORTNESS OF BREATH 17 each 2   benzonatate (TESSALON) 200 MG capsule Take 1 capsule (200 mg total) by mouth 3 (three) times daily. (Patient not taking: Reported on 06/29/2021) 7 capsule 0   dextromethorphan 15 MG/5ML syrup Take 10 mLs (30 mg total) by mouth 4 (four) times daily as needed for cough. (Patient not taking: Reported on 06/29/2021) 120 mL 0   GABAPENTIN PO Take by mouth. (Patient not taking: Reported on 06/29/2021)     guaiFENesin (MUCINEX) 600 MG 12 hr tablet Take 1 tablet (600 mg total) by mouth 2 (two) times daily as needed for to loosen phlegm or cough. (Patient not taking: Reported on 06/29/2021) 30 tablet 2   ipratropium (ATROVENT) 0.06 % nasal spray Place 2 sprays into both nostrils 4 (four) times daily. 15 mL 12   meloxicam (MOBIC) 15  MG tablet Take 15 mg by mouth daily.     naproxen (NAPROSYN) 500 MG tablet Take 1 tablet (500 mg total) by mouth 2 (two) times daily with a meal. 30 tablet 0   Current Facility-Administered Medications  Medication Dose Route Frequency Provider Last Rate Last Admin   0.9 %  sodium chloride infusion  500 mL Intravenous Once Gatha Mayer, MD        Allergies as of 06/29/2021 - Review Complete 06/29/2021  Allergen Reaction Noted   Zanaflex [tizanidine hcl] Other (See Comments) 09/03/2016   Penicillins Other (See Comments) 12/26/2006   Sulfonamide derivatives Itching     Family History  Problem Relation Age of Onset   Hypertension Mother    Arthritis Mother    Cancer Father        lung  Other Neg Hx        pituitary problem   Colon cancer Neg Hx    Colon polyps Neg Hx    Esophageal cancer Neg Hx    Rectal cancer Neg Hx    Stomach cancer Neg Hx     Social History   Socioeconomic History   Marital status: Married    Spouse name: Not on file   Number of children: 2   Years of education: some college   Highest education level: Not on file  Occupational History   Occupation: CNA/med tech  Tobacco Use   Smoking status: Former    Packs/day: 0.50    Years: 18.00    Pack years: 9.00    Types: Cigarettes    Quit date: 04/27/2019    Years since quitting: 2.1   Smokeless tobacco: Never   Tobacco comments:    1-800 number given   Vaping Use   Vaping Use: Never used  Substance and Sexual Activity   Alcohol use: Yes    Alcohol/week: 0.0 standard drinks    Comment: Occasional   Drug use: No    Comment: 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        Social History Narrative   Lives with husband and mother (caregiver for her mother).   Right-handed.   One soda daily.      Review of Systems:  other review of systems negative except as mentioned in the HPI.  Physical Exam: Vital  signs BP 130/85    Pulse 68    Temp (!) 97.1 F (36.2 C)    Resp 17    Ht 5\' 4"  (1.626 m)    Wt 225 lb (102.1 kg)    SpO2 100%    BMI 38.62 kg/m   General:   Alert,  Well-developed, well-nourished, pleasant and cooperative in NAD Lungs:  Clear throughout to auscultation.   Heart:  Regular rate and rhythm; no murmurs, clicks, rubs,  or gallops. Abdomen:  Soft, nontender and nondistended. Normal bowel sounds.   Neuro/Psych:  Alert and cooperative. Normal mood and affect. A and O x 3   @Chryl Holten  Simonne Maffucci, MD, [facility] [phone] (pager) 06/29/2021 8:33 AM@

## 2021-06-29 NOTE — Progress Notes (Signed)
Report to PACU, RN, vss, BBS= Clear.  

## 2021-06-29 NOTE — Telephone Encounter (Signed)
Called patient and asked if she was having any symptoms.  Patient stated that she is constantly burping.  Patient was scheduled 2/20 at 2:30 with you.

## 2021-06-29 NOTE — Op Note (Signed)
[facility] Patient Name: Alyssa Montgomery Procedure Date: 06/29/2021 8:32 AM MRN: [id number] Endoscopist: Gatha Mayer , MD Age: 61 Referring MD:  Date of Birth: April 23, 1961 Gender: Female Account #: [id number] Procedure:                Colonoscopy Indications:              Screening for colorectal malignant neoplasm, Last                            colonoscopy: 2011 Medicines:                Propofol per Anesthesia, Monitored Anesthesia Care Procedure:                Pre-Anesthesia Assessment:                           - Prior to the procedure, a History and Physical                            was performed, and patient medications and                            allergies were reviewed. The patient's tolerance of                            previous anesthesia was also reviewed. The risks                            and benefits of the procedure and the sedation                            options and risks were discussed with the patient.                            All questions were answered, and informed consent                            was obtained. Prior Anticoagulants: The patient has                            taken no previous anticoagulant or antiplatelet                            agents. ASA Grade Assessment: II - A patient with                            mild systemic disease. After reviewing the risks                            and benefits, the patient was deemed in                            satisfactory condition to undergo the procedure.  After obtaining informed consent, the colonoscope                            was passed under direct vision. Throughout the                            procedure, the patient's blood pressure, pulse, and                            oxygen saturations were monitored continuously. The                            Olympus Colonoscope #[id number] was introduced through                            the anus and  advanced to the the cecum, identified                            by appendiceal orifice and ileocecal valve. The                            colonoscopy was performed without difficulty. The                            patient tolerated the procedure well. The quality                            of the bowel preparation was good. The ileocecal                            valve, appendiceal orifice, and rectum were                            photographed. The bowel preparation used was                            GoLYTELY via split dose instruction. Scope In: 8:40:17 AM Scope Out: 8:51:18 AM Scope Withdrawal Time: 0 hours 9 minutes 2 seconds  Total Procedure Duration: 0 hours 11 minutes 1 second  Findings:                 The perianal and digital rectal examinations were                            normal.                           Multiple diverticula were found in the sigmoid                            colon and descending colon.                           The exam was otherwise without abnormality on  direct and retroflexion views. Complications:            No immediate complications. Estimated Blood Loss:     Estimated blood loss: none. Impression:               - Diverticulosis in the sigmoid colon and in the                            descending colon.                           - The examination was otherwise normal on direct                            and retroflexion views.                           - No specimens collected. Recommendation:           - Patient has a contact number available for                            emergencies. The signs and symptoms of potential                            delayed complications were discussed with the                            patient. Return to normal activities tomorrow.                            Written discharge instructions were provided to the                            patient.                           - Resume  previous diet.                           - Continue present medications.                           - Repeat colonoscopy in 10 years for screening                            purposes. Gatha Mayer, MD 06/29/2021 8:58:50 AM This report has been signed electronically.

## 2021-06-30 NOTE — Telephone Encounter (Signed)
Thanks

## 2021-07-03 ENCOUNTER — Telehealth: Payer: Self-pay

## 2021-07-03 DIAGNOSIS — E782 Mixed hyperlipidemia: Secondary | ICD-10-CM | POA: Diagnosis not present

## 2021-07-03 DIAGNOSIS — I10 Essential (primary) hypertension: Secondary | ICD-10-CM | POA: Diagnosis not present

## 2021-07-03 DIAGNOSIS — Z1329 Encounter for screening for other suspected endocrine disorder: Secondary | ICD-10-CM | POA: Diagnosis not present

## 2021-07-03 DIAGNOSIS — Z130 Encounter for screening for diseases of the blood and blood-forming organs and certain disorders involving the immune mechanism: Secondary | ICD-10-CM | POA: Diagnosis not present

## 2021-07-03 DIAGNOSIS — Z0189 Encounter for other specified special examinations: Secondary | ICD-10-CM | POA: Diagnosis not present

## 2021-07-03 DIAGNOSIS — E1142 Type 2 diabetes mellitus with diabetic polyneuropathy: Secondary | ICD-10-CM | POA: Diagnosis not present

## 2021-07-03 NOTE — Telephone Encounter (Signed)
°  Follow up Call-  Call back number 06/29/2021  Post procedure Call Back phone  # [phone]  Permission to leave phone message Yes  Some recent data might be hidden     Patient questions:  Do you have a fever, pain , or abdominal swelling? No. Pain Score  0 *  Have you tolerated food without any problems? Yes.    Have you been able to return to your normal activities? Yes.    Do you have any questions about your discharge instructions: Diet   No. Medications  No. Follow up visit  No.  Do you have questions or concerns about your Care? No.  Actions: * If pain score is 4 or above: No action needed, pain <4.  Have you developed a fever since your procedure? no  2.   Have you had an respiratory symptoms (SOB or cough) since your procedure? no  3.   Have you tested positive for COVID 19 since your procedure no  4.   Have you had any family members/close contacts diagnosed with the COVID 19 since your procedure?  no   If yes to any of these questions please route to Joylene John, RN and Joella Prince, RN

## 2021-07-06 ENCOUNTER — Encounter: Payer: Self-pay | Admitting: Family Medicine

## 2021-07-06 DIAGNOSIS — K5790 Diverticulosis of intestine, part unspecified, without perforation or abscess without bleeding: Secondary | ICD-10-CM | POA: Insufficient documentation

## 2021-07-10 DIAGNOSIS — Z7689 Persons encountering health services in other specified circumstances: Secondary | ICD-10-CM | POA: Diagnosis not present

## 2021-07-10 DIAGNOSIS — Z0001 Encounter for general adult medical examination with abnormal findings: Secondary | ICD-10-CM | POA: Diagnosis not present

## 2021-07-10 DIAGNOSIS — I10 Essential (primary) hypertension: Secondary | ICD-10-CM | POA: Diagnosis not present

## 2021-07-10 DIAGNOSIS — E782 Mixed hyperlipidemia: Secondary | ICD-10-CM | POA: Diagnosis not present

## 2021-07-13 ENCOUNTER — Other Ambulatory Visit: Payer: Self-pay

## 2021-07-13 ENCOUNTER — Encounter (HOSPITAL_BASED_OUTPATIENT_CLINIC_OR_DEPARTMENT_OTHER): Payer: Self-pay | Admitting: Physical Therapy

## 2021-07-13 ENCOUNTER — Ambulatory Visit (HOSPITAL_BASED_OUTPATIENT_CLINIC_OR_DEPARTMENT_OTHER): Payer: Medicare Other | Attending: Orthopedic Surgery | Admitting: Physical Therapy

## 2021-07-13 DIAGNOSIS — M545 Low back pain, unspecified: Secondary | ICD-10-CM | POA: Insufficient documentation

## 2021-07-13 DIAGNOSIS — R262 Difficulty in walking, not elsewhere classified: Secondary | ICD-10-CM | POA: Insufficient documentation

## 2021-07-13 DIAGNOSIS — M6281 Muscle weakness (generalized): Secondary | ICD-10-CM | POA: Diagnosis not present
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
## 2021-07-31 ENCOUNTER — Ambulatory Visit: Payer: Medicare Other | Admitting: Internal Medicine

## 2021-07-31 ENCOUNTER — Ambulatory Visit (HOSPITAL_BASED_OUTPATIENT_CLINIC_OR_DEPARTMENT_OTHER): Payer: Medicare Other | Admitting: Physical Therapy

## 2021-08-04 DIAGNOSIS — H1045 Other chronic allergic conjunctivitis: Secondary | ICD-10-CM | POA: Diagnosis not present

## 2021-08-04 DIAGNOSIS — H0102B Squamous blepharitis left eye, upper and lower eyelids: Secondary | ICD-10-CM | POA: Diagnosis not present

## 2021-08-10 ENCOUNTER — Encounter (HOSPITAL_BASED_OUTPATIENT_CLINIC_OR_DEPARTMENT_OTHER): Payer: Medicare Other | Admitting: Physical Therapy

## 2021-08-15 ENCOUNTER — Emergency Department (HOSPITAL_COMMUNITY)
Admission: EM | Admit: 2021-08-15 | Discharge: 2021-08-16 | Disposition: A | Discharge status: Home / Self Care | Payer: Medicare Other | Attending: Emergency Medicine | Admitting: Emergency Medicine

## 2021-08-15 ENCOUNTER — Encounter (HOSPITAL_COMMUNITY): Payer: Self-pay

## 2021-08-15 ENCOUNTER — Other Ambulatory Visit: Payer: Self-pay

## 2021-08-15 ENCOUNTER — Ambulatory Visit (HOSPITAL_BASED_OUTPATIENT_CLINIC_OR_DEPARTMENT_OTHER): Payer: Medicare Other | Admitting: Physical Therapy

## 2021-08-15 ENCOUNTER — Telehealth: Payer: Self-pay | Admitting: Internal Medicine

## 2021-08-15 ENCOUNTER — Emergency Department (HOSPITAL_COMMUNITY): Payer: Medicare Other

## 2021-08-15 DIAGNOSIS — Z20822 Contact with and (suspected) exposure to covid-19: Secondary | ICD-10-CM | POA: Insufficient documentation

## 2021-08-15 DIAGNOSIS — R110 Nausea: Secondary | ICD-10-CM | POA: Diagnosis not present

## 2021-08-15 DIAGNOSIS — R062 Wheezing: Secondary | ICD-10-CM | POA: Diagnosis not present

## 2021-08-15 DIAGNOSIS — R059 Cough, unspecified: Secondary | ICD-10-CM | POA: Diagnosis not present

## 2021-08-15 DIAGNOSIS — I700 Atherosclerosis of aorta: Secondary | ICD-10-CM | POA: Diagnosis not present

## 2021-08-15 DIAGNOSIS — R109 Unspecified abdominal pain: Secondary | ICD-10-CM | POA: Diagnosis not present

## 2021-08-15 DIAGNOSIS — R0602 Shortness of breath: Secondary | ICD-10-CM | POA: Diagnosis not present

## 2021-08-15 DIAGNOSIS — I10 Essential (primary) hypertension: Secondary | ICD-10-CM | POA: Diagnosis not present

## 2021-08-15 DIAGNOSIS — R079 Chest pain, unspecified: Secondary | ICD-10-CM | POA: Diagnosis not present

## 2021-08-15 DIAGNOSIS — R1013 Epigastric pain: Secondary | ICD-10-CM | POA: Diagnosis not present

## 2021-08-15 DIAGNOSIS — R0789 Other chest pain: Secondary | ICD-10-CM | POA: Diagnosis not present

## 2021-08-15 LAB — CBC
HCT: 39 % (ref 36.0–46.0)
Hemoglobin: 12.5 g/dL (ref 12.0–15.0)
MCH: 26.5 pg (ref 26.0–34.0)
MCHC: 32.1 g/dL (ref 30.0–36.0)
MCV: 82.8 fL (ref 80.0–100.0)
Platelets: 269 10*3/uL (ref 150–400)
RBC: 4.71 MIL/uL (ref 3.87–5.11)
RDW: 14.5 % (ref 11.5–15.5)
WBC: 5.7 10*3/uL (ref 4.0–10.5)
nRBC: 0 % (ref 0.0–0.2)

## 2021-08-15 LAB — BASIC METABOLIC PANEL
Anion gap: 10 (ref 5–15)
BUN: 15 mg/dL (ref 8–23)
CO2: 25 mmol/L (ref 22–32)
Calcium: 9.7 mg/dL (ref 8.9–10.3)
Chloride: 101 mmol/L (ref 98–111)
Creatinine, Ser: 0.87 mg/dL (ref 0.44–1.00)
GFR, Estimated: >60 mL/min (ref >60)
Glucose, Bld: 178 mg/dL — ABNORMAL HIGH (ref 70–99)
Potassium: 3.5 mmol/L (ref 3.5–5.1)
Sodium: 136 mmol/L (ref 135–145)

## 2021-08-15 LAB — RESP PANEL BY RT-PCR (FLU A&B, COVID) ARPGX2
Influenza A by PCR: NEGATIVE
Influenza B by PCR: NEGATIVE
SARS Coronavirus 2 by RT PCR: NEGATIVE

## 2021-08-15 LAB — TROPONIN I (HIGH SENSITIVITY)
Troponin I (High Sensitivity): 4 ng/L (ref <18)
Troponin I (High Sensitivity): 4 ng/L (ref <18)

## 2021-08-15 LAB — D-DIMER, QUANTITATIVE: D-Dimer, Quant: 0.34 ug/mL-FEU (ref 0.00–0.50)

## 2021-08-15 MED ORDER — LIDOCAINE VISCOUS HCL 2 % MT SOLN
15.0000 mL | Freq: Once | OROMUCOSAL | Status: AC
  Administered 2021-08-15: 15 mL via ORAL
  Filled 2021-08-15: qty 15

## 2021-08-15 MED ORDER — ALUM & MAG HYDROXIDE-SIMETH 200-200-20 MG/5ML PO SUSP
30.0000 mL | Freq: Once | ORAL | Status: AC
  Administered 2021-08-15: 30 mL via ORAL
  Filled 2021-08-15: qty 30

## 2021-08-15 MED ORDER — MORPHINE SULFATE (PF) 4 MG/ML IV SOLN
4.0000 mg | Freq: Once | INTRAVENOUS | Status: AC
  Administered 2021-08-15: 4 mg via INTRAVENOUS
  Filled 2021-08-15: qty 1

## 2021-08-15 MED ORDER — FAMOTIDINE IN NACL 20-0.9 MG/50ML-% IV SOLN
20.0000 mg | Freq: Once | INTRAVENOUS | Status: AC
  Administered 2021-08-15: 20 mg via INTRAVENOUS
  Filled 2021-08-15: qty 50

## 2021-08-15 MED ORDER — IOHEXOL 300 MG/ML  SOLN
100.0000 mL | Freq: Once | INTRAMUSCULAR | Status: AC | PRN
  Administered 2021-08-15: 100 mL via INTRAVENOUS

## 2021-08-15 NOTE — ED Provider Notes (Signed)
[facility] EMERGENCY DEPARTMENT Provider Note   CSN: [id number] Arrival date & time: 08/15/21  1349     History  No chief complaint on file.   Alyssa Montgomery is a 61 y.o. female.  HPI  61 year old female with past medical history of obesity, HTN, HLD, TIA, fibromyalgia presents emergency department with cough, indigestion and chest pain.  Patient states she feels like she ate some bad food about 3 days ago.  She started having indigestion/GERD and chest pain.  She has been taking Gas-X with minimal relief.  Feels as if her abdomen is bloated, is having smaller bowel movements, some mild nausea.  Over the last day she has been having nonproductive cough which worsens the chest pain and now she is having what she describes as severe, sharp, constant chest/epigastric pain specifically worse with any deep breaths.  There is some radiation around to her back mainly on the right side.  No noted rash.  No recent fever.  No vomiting or diarrhea.  Home Medications Prior to Admission medications   Medication Sig Start Date End Date Taking? Authorizing Provider  albuterol (VENTOLIN HFA) 108 (90 Base) MCG/ACT inhaler INHALE 2 PUFFS INTO THE LUNGS EVERY 6 HOURS AS NEEDED FOR WHEEZING/SHORTNESS OF BREATH 05/31/21   Ezequiel Essex, MD  benzonatate (TESSALON) 200 MG capsule Take 1 capsule (200 mg total) by mouth 3 (three) times daily. Patient not taking: Reported on 06/29/2021 05/31/21   Ezequiel Essex, MD  dextromethorphan 15 MG/5ML syrup Take 10 mLs (30 mg total) by mouth 4 (four) times daily as needed for cough. Patient not taking: Reported on 06/29/2021 06/22/21   Ezequiel Essex, MD  fluticasone [facility]) 50 MCG/ACT nasal spray PLACE 1 SPRAY INTO BOTH NOSTRILS DAILY AS NEEDED FOR ALLERGIES OR RHINITIS. 05/02/21   Ezequiel Essex, MD  GABAPENTIN PO Take by mouth. Patient not taking: Reported on 06/29/2021    [provider]  guaiFENesin (MUCINEX) 600 MG 12 hr tablet Take 1  tablet (600 mg total) by mouth 2 (two) times daily as needed for to loosen phlegm or cough. 05/31/21   Ezequiel Essex, MD  ipratropium (ATROVENT) 0.06 % nasal spray Place 2 sprays into both nostrils 4 (four) times daily. 06/22/21   Ezequiel Essex, MD  meloxicam (MOBIC) 15 MG tablet Take 15 mg by mouth daily. 06/19/21   [provider]  metFORMIN (GLUCOPHAGE-XR) 500 MG 24 hr tablet Take 2 tablets (1,000 mg total) by mouth daily with breakfast. 01/17/21 06/29/21  Ezequiel Essex, MD  naproxen (NAPROSYN) 500 MG tablet Take 1 tablet (500 mg total) by mouth 2 (two) times daily with a meal. 05/31/21   Ezequiel Essex, MD  pravastatin (PRAVACHOL) 20 MG tablet TAKE 1 TABLET BY MOUTH EVERYDAY AT BEDTIME 04/17/21   Ezequiel Essex, MD      Allergies    Zanaflex [tizanidine hcl], Penicillins, and Sulfonamide derivatives    Review of Systems   Review of Systems  Constitutional:  Negative for fever.  Respiratory:  Positive for cough, chest tightness and shortness of breath.   Cardiovascular:  Positive for chest pain. Negative for palpitations and leg swelling.  Gastrointestinal:  Positive for abdominal pain and nausea. Negative for diarrhea and vomiting.  Musculoskeletal:  Positive for back pain.  Skin:  Negative for rash.  Neurological:  Negative for headaches.   Physical Exam Updated Vital Signs BP 131/85    Pulse 80    Temp (!) 97.5 F (36.4 C) (Oral)    Resp 18  SpO2 100%  Physical Exam Vitals and nursing note reviewed.  Constitutional:      General: She is not in acute distress.    Appearance: Normal appearance.  HENT:     Head: Normocephalic.     Mouth/Throat:     Mouth: Mucous membranes are moist.  Cardiovascular:     Rate and Rhythm: Normal rate.  Pulmonary:     Effort: Pulmonary effort is normal. No respiratory distress.     Breath sounds: Wheezing present.  Abdominal:     Palpations: Abdomen is soft.     Tenderness: There is no abdominal tenderness. There is no guarding or  rebound.  Musculoskeletal:        General: No swelling.  Skin:    General: Skin is warm.  Neurological:     Mental Status: She is alert and oriented to person, place, and time. Mental status is at baseline.  Psychiatric:        Mood and Affect: Mood normal.    ED Results / Procedures / Treatments   Labs (all labs ordered are listed, but only abnormal results are displayed) Labs Reviewed  BASIC METABOLIC PANEL - Abnormal; Notable for the following components:      Result Value   Glucose, Bld 178 (*)    All other components within normal limits  RESP PANEL BY RT-PCR (FLU A&B, COVID) ARPGX2  CBC  D-DIMER, QUANTITATIVE  TROPONIN I (HIGH SENSITIVITY)  TROPONIN I (HIGH SENSITIVITY)    EKG EKG Interpretation  Date/Time:  Tuesday August 15 2021 13:59:24 EST Ventricular Rate:  85 PR Interval:  146 QRS Duration: 96 QT Interval:  390 QTC Calculation: 464 R Axis:   -38 Text Interpretation: Normal sinus rhythm Left axis deviation Incomplete right bundle branch block Left ventricular hypertrophy ( R in aVL , Cornell product , Romhilt-Estes ) Abnormal ECG When compared with ECG of 12-Sep-2020 13:15, PREVIOUS ECG IS PRESENT Unchanged from previous Confirmed by Lavenia Atlas [phone]) on 08/15/2021 7:32:44 PM  Radiology DG Chest 2 View  Result Date: 08/15/2021 CLINICAL DATA:  Chest pain, shortness of breath, and cough. EXAM: CHEST - 2 VIEW COMPARISON:  Chest radiographs 04/27/2021 FINDINGS: The cardiomediastinal silhouette is unchanged with normal heart size. Lung volumes are slightly lower than on the prior study. No airspace consolidation, edema, pleural effusion, or pneumothorax is identified. No acute osseous abnormality is seen. IMPRESSION: No active cardiopulmonary disease. Electronically Signed   By: Logan Bores M.D.   On: 08/15/2021 14:39   CT Abdomen Pelvis W Contrast  Result Date: 08/15/2021 CLINICAL DATA:  Acute abdominal pain. EXAM: CT ABDOMEN AND PELVIS WITH CONTRAST TECHNIQUE:  Multidetector CT imaging of the abdomen and pelvis was performed using the standard protocol following bolus administration of intravenous contrast. RADIATION DOSE REDUCTION: This exam was performed according to the departmental dose-optimization program which includes automated exposure control, adjustment of the mA and/or kV according to patient size and/or use of iterative reconstruction technique. CONTRAST:  172m OMNIPAQUE IOHEXOL 300 MG/ML  SOLN COMPARISON:  CT abdomen and pelvis 05/05/2011 FINDINGS: Lower chest: No acute abnormality. Hepatobiliary: No focal liver abnormality is seen. No gallstones, gallbladder wall thickening, or biliary dilatation. Pancreas: Unremarkable. No pancreatic ductal dilatation or surrounding inflammatory changes. Spleen: Normal in size without focal abnormality. Adrenals/Urinary Tract: Adrenal glands are unremarkable. Kidneys are normal, without renal calculi, focal lesion, or hydronephrosis. Bladder is unremarkable. Stomach/Bowel: Stomach is within normal limits. Appendix appears normal. No evidence of bowel wall thickening, distention, or inflammatory changes. There are  scattered colonic diverticula. Vascular/Lymphatic: Aortic atherosclerosis. No enlarged abdominal or pelvic lymph nodes. Reproductive: Uterus and bilateral adnexa are unremarkable. Other: No abdominal wall hernia or abnormality. No abdominopelvic ascites. Musculoskeletal: Degenerative changes affect the spine. Left hip arthroplasty is present. IMPRESSION: No acute localizing process in the abdomen or pelvis. Colonic diverticulosis without evidence for diverticulitis. Aortic Atherosclerosis (ICD10-I70.0). Electronically Signed   By: Ronney Asters M.D.   On: 08/15/2021 17:16    Procedures Procedures    Medications Ordered in ED Medications  morphine (PF) 4 MG/ML injection 4 mg (has no administration in time range)  iohexol (OMNIPAQUE) 300 MG/ML solution 100 mL (100 mLs Intravenous Contrast Given 08/15/21 1710)     ED Course/ Medical Decision Making/ A&P                           Medical Decision Making Amount and/or Complexity of Data Reviewed Labs: ordered. Radiology: ordered.  Risk OTC drugs. Prescription drug management.   This patient presents to the ED for concern of indigestion, cough and chest pain, this involves an extensive number of treatment options, and is a complaint that carries with it a high risk of complications and morbidity.  The differential diagnosis includes GERD, gastritis, indigestion, ACS, PE, dissection   Additional history obtained: -External records from outside source obtained and reviewed including: Chart review including previous notes, labs, imaging, consultation notes   Lab Tests: -I ordered, reviewed, and interpreted labs.  The pertinent results include: Normal labs, negative flu and COVID, negative troponin with no delta, normal D-dimer   EKG -unchanged for the patient   Imaging Studies ordered: -I ordered imaging studies including chest x-ray, CT of the abdomen pelvis -I independently visualized and interpreted imaging which showed no acute findings -I agree with the radiologist interpretation   Medicines ordered and prescription drug management: -I ordered medication including pain medicine, nausea medicine, GI cocktail for symptoms -Reevaluation of the patient after these medicines showed that the patient improved -I have reviewed the patients home medicines and have made adjustments as needed   ED Course: 61 year old female presents emergency department with indigestion, cough, chest pain.  No vomiting or diarrhea, abdominal exam is benign.  Vitals are stable on arrival.  EKG is unchanged for the patient.  Troponins are negative with no delta, low suspicion for ACS.  D-dimer is negative.  Low suspicion for PE/dissection.  Blood work is reassuring.  CT the abdomen pelvis shows no acute findings.  Unremarkable gallbladder and pancreas.  After  symptomatic treatment patient feels improved.   Cardiac Monitoring: The patient was maintained on a cardiac monitor.  I personally viewed and interpreted the cardiac monitored which showed an underlying rhythm of: Sinus rhythm   Reevaluation: After the interventions noted above, I reevaluated the patient and found that they have :improved   Dispostion: Patient at this time appears safe and stable for discharge and close outpatient follow up. Discharge plan and strict return to ED precautions discussed, patient verbalizes understanding and agreement.        Final Clinical Impression(s) / ED Diagnoses Final diagnoses:  None    Rx / DC Orders ED Discharge Orders     None         Lorelle Gibbs, DO 08/16/21 0017

## 2021-08-15 NOTE — ED Triage Notes (Signed)
Patient complains of cough and chest pain x 3 days. Pain will any movement and inspiration, using gas ex with no relief ?

## 2021-08-15 NOTE — ED Provider Triage Note (Signed)
Emergency Medicine Provider Triage Evaluation Note ? ?Alyssa Montgomery , a 61 y.o. female  was evaluated in triage.  Pt complains of central chest pain, epigastric pain, and upper abdominal pain over the last 2 days.  Also complaining of diarrhea since yesterday.  Hx GERD and DM II.  Endorses cough, which worsens the chest pain.  Used to be on PPI, has not been on it in years.  Current tobacco use.  Denies recent upper respiratory infection or close contacts with similar symptoms.  Denies fever. ? ?Review of Systems  ?Positive: As above ?Negative: As above ? ?Physical Exam  ?BP (!) 147/81   Pulse 88   Temp (!) 97.5 ?F (36.4 ?C) (Oral)   Resp (!) 22   SpO2 97%  ?Gen:   Awake, no distress   ?Resp:  Normal effort, CTAB ?MSK:   Moves extremities without difficulty  ?Other:  RRR w/o M/R/G.  Upper abdominal tenderness with reproducible chest pain; appears to have radiation to mid back BIL.  Observed wet sounding cough. ? ?Medical Decision Making  ?Medically screening exam initiated at 3:33 PM.  Appropriate orders placed.  CADANCE RAUS was informed that the remainder of the evaluation will be completed by another provider, this initial triage assessment does not replace that evaluation, and the importance of remaining in the ED until their evaluation is complete. ? ?Labs, imaging, EKG ?  ?Prince Rome, PA-C ?03/00/92 1542 ? ?

## 2021-08-15 NOTE — Telephone Encounter (Signed)
Pt stated that she is having severe chest pain: Pt states that she has taking lots of Gas X and Alka-Seltzer with no relief and feels that the pain is getting worse: Pt was notified that she should go to her local ED ASAP to seek evaluation and treatment. Pt stated that "they are not going do anything over there except make me sit there for hours" Pt was notified with the pain that she is experiencing she does need to go to the ED for evaluation and treatment ASAP: ?Pt verbalized understanding with all questions answered.  ? ?

## 2021-08-15 NOTE — Telephone Encounter (Signed)
Patient called complaining that her chest "feels like it's about to bust."  She assumes it's gas pains, but it is just not moving.  She doesn't know if there's a blockage or what, but she is extremely uncomfortable and experiencing pain with it.  Please call patient and advise.  Thank you. ?

## 2021-08-16 DIAGNOSIS — R059 Cough, unspecified: Secondary | ICD-10-CM | POA: Diagnosis not present

## 2021-08-16 MED ORDER — OMEPRAZOLE 20 MG PO CPDR: 20.0000 mg | DELAYED_RELEASE_CAPSULE | Freq: Every day | ORAL | 0 refills | Status: DC

## 2021-08-16 MED ORDER — MAALOX MAX 400-400-40 MG/5ML PO SUSP: 10.0000 mL | Freq: Four times a day (QID) | ORAL | 0 refills | Status: DC | PRN

## 2021-08-16 MED ORDER — ONDANSETRON 4 MG PO TBDP
8.0000 mg | ORAL_TABLET | Freq: Once | ORAL | Status: AC
  Administered 2021-08-16: 8 mg via ORAL
  Filled 2021-08-16: qty 2

## 2021-08-16 MED ORDER — ONDANSETRON HCL 4 MG PO TABS
4.0000 mg | ORAL_TABLET | Freq: Three times a day (TID) | ORAL | 0 refills | Status: DC | PRN
Start: 2021-08-16 — End: 2022-05-12

## 2021-08-16 NOTE — Discharge Instructions (Signed)
You have been seen and discharged from the emergency department.  Your blood work, CT of your abdomen and pelvis, heart work-up was normal.  Take new medications as prescribed.  Follow-up with your primary provider for further evaluation and further care. Take home medications as prescribed. If you have any worsening symptoms or further concerns for your health please return to an emergency department for further evaluation.   ?

## 2021-08-16 NOTE — ED Notes (Signed)
DC instructions reviewed with pt. PT verbalized understanding. PT is waiting for husband but is otherwise DC. ?

## 2021-08-16 NOTE — ED Notes (Signed)
PT throwing up some green colored fluid. '8mg'$  zofran ordered per Dr. Christy Gentles ?

## 2021-08-17 ENCOUNTER — Encounter (HOSPITAL_BASED_OUTPATIENT_CLINIC_OR_DEPARTMENT_OTHER): Payer: Medicare Other | Admitting: Physical Therapy

## 2021-08-19 DIAGNOSIS — R030 Elevated blood-pressure reading, without diagnosis of hypertension: Secondary | ICD-10-CM | POA: Diagnosis not present

## 2021-08-19 DIAGNOSIS — R079 Chest pain, unspecified: Secondary | ICD-10-CM | POA: Diagnosis not present

## 2021-08-19 DIAGNOSIS — R1084 Generalized abdominal pain: Secondary | ICD-10-CM | POA: Diagnosis not present

## 2021-08-22 ENCOUNTER — Ambulatory Visit (HOSPITAL_BASED_OUTPATIENT_CLINIC_OR_DEPARTMENT_OTHER): Payer: Medicare Other | Admitting: Physical Therapy

## 2021-08-24 ENCOUNTER — Encounter (HOSPITAL_BASED_OUTPATIENT_CLINIC_OR_DEPARTMENT_OTHER): Payer: Medicare Other | Admitting: Physical Therapy

## 2021-08-24 ENCOUNTER — Ambulatory Visit: Payer: Medicare Other | Admitting: Nurse Practitioner

## 2021-08-29 ENCOUNTER — Ambulatory Visit (HOSPITAL_BASED_OUTPATIENT_CLINIC_OR_DEPARTMENT_OTHER): Payer: Medicare Other | Admitting: Physical Therapy

## 2021-08-29 DIAGNOSIS — R1013 Epigastric pain: Secondary | ICD-10-CM | POA: Diagnosis not present

## 2021-08-30 ENCOUNTER — Inpatient Hospital Stay: Payer: Medicare Other | Admitting: Family Medicine

## 2021-08-31 ENCOUNTER — Encounter (HOSPITAL_BASED_OUTPATIENT_CLINIC_OR_DEPARTMENT_OTHER): Payer: Medicare Other | Admitting: Physical Therapy

## 2021-08-31 DIAGNOSIS — R1084 Generalized abdominal pain: Secondary | ICD-10-CM | POA: Diagnosis not present

## 2021-09-04 ENCOUNTER — Ambulatory Visit: Payer: Medicare Other | Admitting: Internal Medicine

## 2021-09-07 DIAGNOSIS — E119 Type 2 diabetes mellitus without complications: Secondary | ICD-10-CM | POA: Diagnosis not present

## 2021-09-07 DIAGNOSIS — Z1211 Encounter for screening for malignant neoplasm of colon: Secondary | ICD-10-CM | POA: Diagnosis not present

## 2021-09-07 DIAGNOSIS — R1013 Epigastric pain: Secondary | ICD-10-CM | POA: Diagnosis not present

## 2021-09-07 DIAGNOSIS — R1310 Dysphagia, unspecified: Secondary | ICD-10-CM | POA: Diagnosis not present

## 2021-09-07 DIAGNOSIS — K2270 Barrett's esophagus without dysplasia: Secondary | ICD-10-CM | POA: Diagnosis not present

## 2021-09-13 ENCOUNTER — Inpatient Hospital Stay: Payer: Medicare Other | Admitting: Family Medicine

## 2021-09-21 DIAGNOSIS — M25561 Pain in right knee: Secondary | ICD-10-CM | POA: Diagnosis not present

## 2021-09-21 DIAGNOSIS — M25562 Pain in left knee: Secondary | ICD-10-CM | POA: Diagnosis not present

## 2021-09-21 DIAGNOSIS — Z96643 Presence of artificial hip joint, bilateral: Secondary | ICD-10-CM | POA: Diagnosis not present

## 2021-09-21 DIAGNOSIS — Z96642 Presence of left artificial hip joint: Secondary | ICD-10-CM | POA: Diagnosis not present

## 2021-09-21 DIAGNOSIS — S93402A Sprain of unspecified ligament of left ankle, initial encounter: Secondary | ICD-10-CM | POA: Diagnosis not present

## 2021-09-25 ENCOUNTER — Ambulatory Visit (HOSPITAL_BASED_OUTPATIENT_CLINIC_OR_DEPARTMENT_OTHER): Payer: Medicare Other | Admitting: Physical Therapy

## 2021-10-07 DIAGNOSIS — M25512 Pain in left shoulder: Secondary | ICD-10-CM | POA: Diagnosis not present

## 2021-10-07 DIAGNOSIS — J018 Other acute sinusitis: Secondary | ICD-10-CM | POA: Diagnosis not present

## 2021-10-07 DIAGNOSIS — Z0000 Encounter for general adult medical examination without abnormal findings: Secondary | ICD-10-CM | POA: Diagnosis not present

## 2021-10-07 DIAGNOSIS — M25511 Pain in right shoulder: Secondary | ICD-10-CM | POA: Diagnosis not present

## 2021-10-11 DIAGNOSIS — M25512 Pain in left shoulder: Secondary | ICD-10-CM | POA: Diagnosis not present

## 2021-10-11 DIAGNOSIS — M4316 Spondylolisthesis, lumbar region: Secondary | ICD-10-CM | POA: Diagnosis not present

## 2021-10-11 DIAGNOSIS — G8929 Other chronic pain: Secondary | ICD-10-CM | POA: Diagnosis not present

## 2021-10-11 DIAGNOSIS — M542 Cervicalgia: Secondary | ICD-10-CM | POA: Diagnosis not present

## 2021-10-15 ENCOUNTER — Other Ambulatory Visit: Payer: Self-pay | Admitting: Family Medicine

## 2021-10-15 DIAGNOSIS — E785 Hyperlipidemia, unspecified: Secondary | ICD-10-CM

## 2021-10-16 ENCOUNTER — Encounter: Payer: Self-pay | Admitting: Family Medicine

## 2021-10-16 ENCOUNTER — Other Ambulatory Visit: Payer: Self-pay | Admitting: Family Medicine

## 2021-10-16 DIAGNOSIS — E1169 Type 2 diabetes mellitus with other specified complication: Secondary | ICD-10-CM

## 2021-10-16 NOTE — Progress Notes (Signed)
Requested refill of statin. Due for fasting lipid panel to assess response and calculate ASCVD risk. Will order future lipid panel and have her complete at her convenience.  ? ?Ezequiel Essex, MD ? ?

## 2021-10-23 ENCOUNTER — Ambulatory Visit (HOSPITAL_BASED_OUTPATIENT_CLINIC_OR_DEPARTMENT_OTHER): Payer: Medicare Other | Attending: Orthopedic Surgery | Admitting: Physical Therapy

## 2021-10-23 ENCOUNTER — Encounter (HOSPITAL_BASED_OUTPATIENT_CLINIC_OR_DEPARTMENT_OTHER): Payer: Self-pay | Admitting: Physical Therapy

## 2021-10-23 DIAGNOSIS — M545 Low back pain, unspecified: Secondary | ICD-10-CM | POA: Diagnosis not present

## 2021-10-23 DIAGNOSIS — R262 Difficulty in walking, not elsewhere classified: Secondary | ICD-10-CM | POA: Insufficient documentation

## 2021-10-23 DIAGNOSIS — M6281 Muscle weakness (generalized): Secondary | ICD-10-CM | POA: Diagnosis not present
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
## 2021-10-25 DIAGNOSIS — K2270 Barrett's esophagus without dysplasia: Secondary | ICD-10-CM | POA: Diagnosis not present

## 2021-10-25 DIAGNOSIS — R1310 Dysphagia, unspecified: Secondary | ICD-10-CM | POA: Diagnosis not present

## 2021-10-25 DIAGNOSIS — K5909 Other constipation: Secondary | ICD-10-CM | POA: Diagnosis not present

## 2021-10-26 ENCOUNTER — Other Ambulatory Visit: Payer: Medicare Other

## 2021-10-26 DIAGNOSIS — E785 Hyperlipidemia, unspecified: Secondary | ICD-10-CM | POA: Diagnosis not present

## 2021-10-26 DIAGNOSIS — E1169 Type 2 diabetes mellitus with other specified complication: Secondary | ICD-10-CM

## 2021-10-26 DIAGNOSIS — M5416 Radiculopathy, lumbar region: Secondary | ICD-10-CM | POA: Diagnosis not present

## 2021-10-27 ENCOUNTER — Encounter: Payer: Self-pay | Admitting: Family Medicine

## 2021-10-27 ENCOUNTER — Telehealth: Payer: Self-pay | Admitting: Family Medicine

## 2021-10-27 DIAGNOSIS — E1169 Type 2 diabetes mellitus with other specified complication: Secondary | ICD-10-CM

## 2021-10-27 LAB — LIPID PANEL
Chol/HDL Ratio: 2.9 ratio (ref 0.0–4.4)
Cholesterol, Total: 200 mg/dL — ABNORMAL HIGH (ref 100–199)
HDL: 68 mg/dL (ref >39)
LDL Chol Calc (NIH): 106 mg/dL — ABNORMAL HIGH (ref 0–99)
Triglycerides: 150 mg/dL — ABNORMAL HIGH (ref 0–149)
VLDL Cholesterol Cal: 26 mg/dL (ref 5–40)

## 2021-10-27 NOTE — Telephone Encounter (Signed)
Called patient to discuss lipid results. ASCVD 17.1%. Hx T2DM (well controlled), TIA. She is understandably perplexed that her lipid panel has worsened slightly. She reports taking the statin nightly, missing only one day every once in a while. She does report nocturnal leg cramping occasionally that she attributes to the statin, but says this is the best statin tolerance she's had ever compared to previous.   She will double her pravastatin from 20 mg daily to 40 mg daily. Recheck direct LDL in 3 months.   Ezequiel Essex, MD

## 2021-10-30 DIAGNOSIS — M5451 Vertebrogenic low back pain: Secondary | ICD-10-CM | POA: Diagnosis not present

## 2021-11-02 DIAGNOSIS — M542 Cervicalgia: Secondary | ICD-10-CM | POA: Diagnosis not present

## 2021-11-02 DIAGNOSIS — M25512 Pain in left shoulder: Secondary | ICD-10-CM | POA: Diagnosis not present

## 2021-11-15 ENCOUNTER — Other Ambulatory Visit: Payer: Self-pay | Admitting: Family Medicine

## 2021-11-15 DIAGNOSIS — M169 Osteoarthritis of hip, unspecified: Secondary | ICD-10-CM

## 2021-11-15 DIAGNOSIS — M75121 Complete rotator cuff tear or rupture of right shoulder, not specified as traumatic: Secondary | ICD-10-CM

## 2021-11-15 DIAGNOSIS — E119 Type 2 diabetes mellitus without complications: Secondary | ICD-10-CM

## 2021-11-16 ENCOUNTER — Ambulatory Visit (HOSPITAL_BASED_OUTPATIENT_CLINIC_OR_DEPARTMENT_OTHER): Payer: Medicare Other | Attending: Orthopedic Surgery | Admitting: Physical Therapy

## 2021-11-16 DIAGNOSIS — R262 Difficulty in walking, not elsewhere classified: Secondary | ICD-10-CM | POA: Diagnosis not present

## 2021-11-16 DIAGNOSIS — M6281 Muscle weakness (generalized): Secondary | ICD-10-CM | POA: Insufficient documentation

## 2021-11-16 DIAGNOSIS — M545 Low back pain, unspecified: Secondary | ICD-10-CM | POA: Diagnosis not present
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
## 2021-11-18 ENCOUNTER — Other Ambulatory Visit: Payer: Self-pay | Admitting: Family Medicine

## 2021-11-18 DIAGNOSIS — E785 Hyperlipidemia, unspecified: Secondary | ICD-10-CM

## 2021-11-23 ENCOUNTER — Encounter (HOSPITAL_BASED_OUTPATIENT_CLINIC_OR_DEPARTMENT_OTHER): Payer: Self-pay | Admitting: Physical Therapy

## 2021-11-23 ENCOUNTER — Ambulatory Visit (HOSPITAL_BASED_OUTPATIENT_CLINIC_OR_DEPARTMENT_OTHER): Payer: Medicare Other | Admitting: Physical Therapy

## 2021-11-23 DIAGNOSIS — R262 Difficulty in walking, not elsewhere classified: Secondary | ICD-10-CM | POA: Diagnosis not present

## 2021-11-23 DIAGNOSIS — M6281 Muscle weakness (generalized): Secondary | ICD-10-CM | POA: Diagnosis not present

## 2021-11-23 DIAGNOSIS — M545 Low back pain, unspecified: Secondary | ICD-10-CM
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
## 2021-11-28 DIAGNOSIS — M542 Cervicalgia: Secondary | ICD-10-CM | POA: Diagnosis not present

## 2021-11-30 ENCOUNTER — Encounter (HOSPITAL_BASED_OUTPATIENT_CLINIC_OR_DEPARTMENT_OTHER): Payer: Self-pay | Admitting: Physical Therapy

## 2021-11-30 ENCOUNTER — Ambulatory Visit (HOSPITAL_BASED_OUTPATIENT_CLINIC_OR_DEPARTMENT_OTHER): Payer: Medicare Other | Admitting: Physical Therapy

## 2021-11-30 DIAGNOSIS — R262 Difficulty in walking, not elsewhere classified: Secondary | ICD-10-CM | POA: Diagnosis not present

## 2021-11-30 DIAGNOSIS — M6281 Muscle weakness (generalized): Secondary | ICD-10-CM | POA: Diagnosis not present

## 2021-11-30 DIAGNOSIS — M545 Low back pain, unspecified: Secondary | ICD-10-CM | POA: Diagnosis not present
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
## 2021-12-04 ENCOUNTER — Other Ambulatory Visit: Payer: Self-pay | Admitting: Family Medicine

## 2021-12-04 DIAGNOSIS — M75121 Complete rotator cuff tear or rupture of right shoulder, not specified as traumatic: Secondary | ICD-10-CM

## 2021-12-04 DIAGNOSIS — M169 Osteoarthritis of hip, unspecified: Secondary | ICD-10-CM

## 2021-12-07 ENCOUNTER — Ambulatory Visit (HOSPITAL_BASED_OUTPATIENT_CLINIC_OR_DEPARTMENT_OTHER): Payer: Medicare Other | Admitting: Physical Therapy

## 2021-12-07 ENCOUNTER — Encounter (HOSPITAL_BASED_OUTPATIENT_CLINIC_OR_DEPARTMENT_OTHER): Payer: Self-pay | Admitting: Physical Therapy

## 2021-12-07 DIAGNOSIS — R262 Difficulty in walking, not elsewhere classified: Secondary | ICD-10-CM

## 2021-12-07 DIAGNOSIS — M545 Low back pain, unspecified: Secondary | ICD-10-CM | POA: Diagnosis not present

## 2021-12-07 DIAGNOSIS — M6281 Muscle weakness (generalized): Secondary | ICD-10-CM
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
## 2021-12-08 DIAGNOSIS — M542 Cervicalgia: Secondary | ICD-10-CM | POA: Diagnosis not present

## 2021-12-08 DIAGNOSIS — M25512 Pain in left shoulder: Secondary | ICD-10-CM | POA: Diagnosis not present

## 2021-12-13 DIAGNOSIS — M5451 Vertebrogenic low back pain: Secondary | ICD-10-CM | POA: Diagnosis not present

## 2021-12-14 ENCOUNTER — Ambulatory Visit (HOSPITAL_BASED_OUTPATIENT_CLINIC_OR_DEPARTMENT_OTHER): Payer: Medicare Other | Attending: Orthopedic Surgery | Admitting: Physical Therapy

## 2021-12-14 DIAGNOSIS — M545 Low back pain, unspecified: Secondary | ICD-10-CM | POA: Insufficient documentation

## 2021-12-14 DIAGNOSIS — M6281 Muscle weakness (generalized): Secondary | ICD-10-CM | POA: Insufficient documentation

## 2021-12-14 DIAGNOSIS — R262 Difficulty in walking, not elsewhere classified: Secondary | ICD-10-CM | POA: Insufficient documentation
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
## 2021-12-15 ENCOUNTER — Other Ambulatory Visit: Payer: Self-pay | Admitting: Family Medicine

## 2021-12-15 ENCOUNTER — Encounter: Payer: Self-pay | Admitting: Family Medicine

## 2021-12-15 ENCOUNTER — Ambulatory Visit (INDEPENDENT_AMBULATORY_CARE_PROVIDER_SITE_OTHER): Payer: Medicare Other | Admitting: Family Medicine

## 2021-12-15 VITALS — BP 120/69 | HR 74 | Ht 64.0 in | Wt 234.0 lb

## 2021-12-15 DIAGNOSIS — R051 Acute cough: Secondary | ICD-10-CM | POA: Diagnosis not present

## 2021-12-15 DIAGNOSIS — E785 Hyperlipidemia, unspecified: Secondary | ICD-10-CM

## 2021-12-15 DIAGNOSIS — I152 Hypertension secondary to endocrine disorders: Secondary | ICD-10-CM | POA: Diagnosis not present

## 2021-12-15 DIAGNOSIS — E119 Type 2 diabetes mellitus without complications: Secondary | ICD-10-CM | POA: Diagnosis not present

## 2021-12-15 DIAGNOSIS — E1159 Type 2 diabetes mellitus with other circulatory complications: Secondary | ICD-10-CM | POA: Diagnosis not present

## 2021-12-15 DIAGNOSIS — E1169 Type 2 diabetes mellitus with other specified complication: Secondary | ICD-10-CM | POA: Diagnosis not present

## 2021-12-15 DIAGNOSIS — R350 Frequency of micturition: Secondary | ICD-10-CM | POA: Diagnosis not present

## 2021-12-15 LAB — POCT URINALYSIS DIP (CLINITEK)
Bilirubin, UA: NEGATIVE
Blood, UA: NEGATIVE
Glucose, UA: NEGATIVE mg/dL
Ketones, POC UA: NEGATIVE mg/dL
Leukocytes, UA: NEGATIVE
Nitrite, UA: NEGATIVE
POC PROTEIN,UA: NEGATIVE
Spec Grav, UA: 1.025 (ref 1.010–1.025)
Urobilinogen, UA: 0.2 E.U./dL
pH, UA: 5.5 (ref 5.0–8.0)

## 2021-12-15 MED ORDER — ROSUVASTATIN CALCIUM 5 MG PO TABS: 5.0000 mg | ORAL_TABLET | Freq: Every day | ORAL | 3 refills | Status: DC

## 2021-12-15 NOTE — Assessment & Plan Note (Signed)
Discontinued pravastatin due to myalgias. - trial rosuvastatin 5 mg - LDL

## 2021-12-15 NOTE — Progress Notes (Signed)
SUBJECTIVE:   CHIEF COMPLAINT / HPI:  Chief Complaint  Patient presents with   Cough    X 1 week    fell on July 2    Hurt shoulder - seeing orthopeadic   cramping in left leg    Wonders if it is because of prevastatin ( doubled up) or kidneys     Reports productive cough ongoing for 1 week.  Also has been feeling congested.  Denies fever, chills, shortness of breath.  No known sick contacts.  She has Flonase but has not been using it.  She also reports urinary frequency for 1 week.  No fever, chills, dysuria.  Patient is concerned about her kidneys.  She is currently taking naproxen prescribed by her orthopedic doctor after a recent fall this past week.  She is not taking more than the prescribed amount.  She would like her kidney function checked.  She also has concerns about her pravastatin medication.  She was recently told to increase her pravastatin dose.  She has noticed some cramping in her legs.  She has stopped the pravastatin altogether with improvement.  She tried to take the original dose but still had leg cramping.  Previously was intolerant to atorvastatin.  Request to have her cholesterol levels checked today.  PERTINENT  PMH / PSH: GERD, obesity, T2DM, HLD  Patient Care Team: Ezequiel Essex, MD as PCP - General (Family Medicine) Bufford Lope, DO (Inactive) as Resident (Family Medicine) Judith Part, MD as Consulting Physician (Neurosurgery)   OBJECTIVE:   BP 120/69   Pulse 74   Ht '5\' 4"'$  (1.626 m)   Wt 234 lb (106.1 kg)   SpO2 98%   BMI 40.17 kg/m   Physical Exam Constitutional:      General: She is not in acute distress.    Appearance: Normal appearance. She is obese. She is not ill-appearing.  HENT:     Head: Normocephalic and atraumatic.     Right Ear: Tympanic membrane normal.     Left Ear: Tympanic membrane normal.     Mouth/Throat:     Mouth: Mucous membranes are moist.     Pharynx: Oropharynx is clear. No oropharyngeal exudate or  posterior oropharyngeal erythema.  Cardiovascular:     Rate and Rhythm: Normal rate and regular rhythm.  Pulmonary:     Effort: Pulmonary effort is normal. No respiratory distress.     Breath sounds: Normal breath sounds.  Abdominal:     Tenderness: There is no right CVA tenderness or left CVA tenderness.  Musculoskeletal:     Cervical back: Neck supple.  Neurological:     Mental Status: She is alert.         12/15/2021   10:33 AM  Depression screen PHQ 2/9  Decreased Interest 1  Down, Depressed, Hopeless 0  PHQ - 2 Score 1  Altered sleeping 1  Tired, decreased energy 1  Change in appetite 1  Feeling bad or failure about yourself  0  Trouble concentrating 0  Moving slowly or fidgety/restless 0  Suicidal thoughts 0  PHQ-9 Score 4     {Show previous vital signs (optional):23777}    ASSESSMENT/PLAN:   Cough Likely viral URI or bronchitis. - supportive care, recommended honey for cough and Flonase for congestion  Urinary frequency UA does not suggest UTI.  No signs of ascending infection. - urine culture  NSAID use - check BMP per patient request  Hyperlipidemia associated with type 2 diabetes mellitus ([place])  Discontinued pravastatin due to myalgias. - trial rosuvastatin 5 mg - LDL    Return in about 4 weeks (around 01/12/2022) for f/u HLD.   Zola Button, MD [place]

## 2021-12-15 NOTE — Patient Instructions (Addendum)
It was nice seeing you today!  Take Flonase for your congestion.  I recommend honey for cough.  Start taking rosvustatin instead of the pravastatin.  I will send your urine for culture.  Follow-up with Dr. Jeani Hawking in 1 month to see how you are doing with the new medication.  Stay well, Zola Button, MD [place] [phone]  --  Make sure to check out at the front desk before you leave today.  Please arrive at least 15 minutes prior to your scheduled appointments.  If you had blood work today, I will send you a MyChart message or a letter if results are normal. Otherwise, I will give you a call.  If you had a referral placed, they will call you to set up an appointment. Please give [place] a call if you don't hear back in the next 2 weeks.  If you need additional refills before your next appointment, please call your pharmacy first.

## 2021-12-16 ENCOUNTER — Encounter: Payer: Self-pay | Admitting: Family Medicine

## 2021-12-16 LAB — BASIC METABOLIC PANEL
BUN/Creatinine Ratio: 25 (ref 12–28)
BUN: 20 mg/dL (ref 8–27)
CO2: 21 mmol/L (ref 20–29)
Calcium: 9.4 mg/dL (ref 8.7–10.3)
Chloride: 102 mmol/L (ref 96–106)
Creatinine, Ser: 0.8 mg/dL (ref 0.57–1.00)
Glucose: 111 mg/dL — ABNORMAL HIGH (ref 70–99)
Potassium: 4.3 mmol/L (ref 3.5–5.2)
Sodium: 141 mmol/L (ref 134–144)
eGFR: 83 mL/min/{1.73_m2} (ref >59)

## 2021-12-16 LAB — LDL CHOLESTEROL, DIRECT: LDL Direct: 127 mg/dL — ABNORMAL HIGH (ref 0–99)

## 2021-12-18 ENCOUNTER — Telehealth: Payer: Self-pay

## 2021-12-18 LAB — URINE CULTURE

## 2021-12-18 MED ORDER — CEPHALEXIN 500 MG PO CAPS: 500.0000 mg | ORAL_CAPSULE | Freq: Two times a day (BID) | ORAL | 0 refills | Status: AC

## 2021-12-18 NOTE — Telephone Encounter (Signed)
Please let her know that she has a urinary tract infection based on her urine culture. I have sent antibiotics to her pharmacy. Cephalexin 500 mg BID x 7d. Thank you!

## 2021-12-18 NOTE — Telephone Encounter (Signed)
Patient calls nurse line requesting to speak with provider regarding results from office visit on 12/15/21.  I am happy to return call to patient with detailed message with these results.   Please advise.   Talbot Grumbling, RN

## 2021-12-18 NOTE — Telephone Encounter (Signed)
LVM asking patient to return my call.

## 2021-12-19 NOTE — Telephone Encounter (Signed)
Patient returns call to nurse line. Informed of results and medication being sent to pharmacy.   Patient is requesting to schedule a follow up appointment for after she finishes the abx, as she wants "to make sure the infection is gone"  Scheduled patient with Dr. Markus Jarvis for next Friday, 7/21. PCP not available until August and patient is unable to come in with PCP's availability.   Talbot Grumbling, RN

## 2021-12-21 ENCOUNTER — Ambulatory Visit (HOSPITAL_BASED_OUTPATIENT_CLINIC_OR_DEPARTMENT_OTHER): Payer: Medicare Other | Admitting: Physical Therapy

## 2021-12-21 DIAGNOSIS — M25512 Pain in left shoulder: Secondary | ICD-10-CM | POA: Diagnosis not present

## 2021-12-22 ENCOUNTER — Other Ambulatory Visit: Payer: Self-pay | Admitting: Family Medicine

## 2021-12-22 ENCOUNTER — Other Ambulatory Visit: Payer: Self-pay | Admitting: Orthopedic Surgery

## 2021-12-22 DIAGNOSIS — M542 Cervicalgia: Secondary | ICD-10-CM
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
## 2021-12-25 ENCOUNTER — Ambulatory Visit
Admission: RE | Admit: 2021-12-25 | Discharge: 2021-12-25 | Disposition: A | Discharge status: Home / Self Care | Payer: Medicare Other | Source: Ambulatory Visit | Attending: Orthopedic Surgery | Admitting: Orthopedic Surgery

## 2021-12-25 ENCOUNTER — Ambulatory Visit (HOSPITAL_BASED_OUTPATIENT_CLINIC_OR_DEPARTMENT_OTHER): Payer: Medicare Other | Admitting: Physical Therapy

## 2021-12-25 DIAGNOSIS — M542 Cervicalgia: Secondary | ICD-10-CM

## 2021-12-25 DIAGNOSIS — R262 Difficulty in walking, not elsewhere classified: Secondary | ICD-10-CM

## 2021-12-25 DIAGNOSIS — M47812 Spondylosis without myelopathy or radiculopathy, cervical region: Secondary | ICD-10-CM | POA: Diagnosis not present

## 2021-12-25 DIAGNOSIS — M545 Low back pain, unspecified: Secondary | ICD-10-CM | POA: Diagnosis not present

## 2021-12-25 DIAGNOSIS — R200 Anesthesia of skin: Secondary | ICD-10-CM | POA: Diagnosis not present

## 2021-12-25 DIAGNOSIS — M6281 Muscle weakness (generalized): Secondary | ICD-10-CM | POA: Diagnosis not present

## 2021-12-28 ENCOUNTER — Ambulatory Visit (HOSPITAL_BASED_OUTPATIENT_CLINIC_OR_DEPARTMENT_OTHER): Payer: Medicare Other | Admitting: Physical Therapy

## 2021-12-29 ENCOUNTER — Ambulatory Visit (INDEPENDENT_AMBULATORY_CARE_PROVIDER_SITE_OTHER): Payer: Medicare Other | Admitting: Family Medicine

## 2021-12-29 ENCOUNTER — Encounter: Payer: Self-pay | Admitting: Family Medicine

## 2021-12-29 VITALS — BP 110/87 | HR 76 | Ht 64.0 in | Wt 235.2 lb

## 2021-12-29 DIAGNOSIS — N3000 Acute cystitis without hematuria: Secondary | ICD-10-CM | POA: Diagnosis not present

## 2021-12-29 DIAGNOSIS — E119 Type 2 diabetes mellitus without complications: Secondary | ICD-10-CM | POA: Diagnosis not present

## 2021-12-29 DIAGNOSIS — M542 Cervicalgia: Secondary | ICD-10-CM | POA: Diagnosis not present

## 2021-12-29 DIAGNOSIS — R252 Cramp and spasm: Secondary | ICD-10-CM | POA: Diagnosis not present

## 2021-12-29 LAB — POCT GLYCOSYLATED HEMOGLOBIN (HGB A1C): HbA1c, POC (controlled diabetic range): 7.3 % — AB (ref 0.0–7.0)

## 2021-12-29 NOTE — Assessment & Plan Note (Signed)
A1c elevated to 7.3 today. Reports poor metformin adherence. Pt seems motivated to take metformin more consistently. Also discussed lifestyle modification. - Cont Metformin '1000mg'$  daily - repeat A1c in 3 months
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
## 2021-12-29 NOTE — Patient Instructions (Signed)
Good to see you today - Thank you for coming in!  Things we discussed today:  You are recovering well from your UTI. Urinating after sex can help decrease your risk of infection in the future.   Your A1c is 7.7, our goal is to keep it under 7. Getting better control of diabetes can also help prevent UTI's. Take your Metformin every day. Keeping up physical activity and improving your diet can help diabetes control.   While you are still having body cramps, you can hold off on starting your statin medication. You can take Tylenol as you need for the pain.  Please come back to clinic if you start having increased urinary frequency or urinary pain.

## 2021-12-29 NOTE — Assessment & Plan Note (Signed)
Completed 7day course Cephalexin for proteus UTI. Reports improving urinary frequency. Denies dysuria and fever. Exam reassuring. Likely resolved and no further tx indicated at this time. Pt had UTI ~24yrago with EColi. Pt concerned that UTI might be related to sex, she is currently sexually active with husband.  - No further workup or tx at this time - Consider further workup including vaginal atrophy if recurrent UTI's
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
## 2022-01-02 DIAGNOSIS — I10 Essential (primary) hypertension: Secondary | ICD-10-CM | POA: Diagnosis not present

## 2022-01-02 DIAGNOSIS — E119 Type 2 diabetes mellitus without complications: Secondary | ICD-10-CM | POA: Diagnosis not present

## 2022-01-02 DIAGNOSIS — R300 Dysuria: Secondary | ICD-10-CM | POA: Diagnosis not present

## 2022-01-02 DIAGNOSIS — E7800 Pure hypercholesterolemia, unspecified: Secondary | ICD-10-CM | POA: Diagnosis not present

## 2022-01-03 ENCOUNTER — Other Ambulatory Visit: Payer: Self-pay | Admitting: Family Medicine

## 2022-01-03 ENCOUNTER — Other Ambulatory Visit: Payer: Self-pay | Admitting: Nurse Practitioner

## 2022-01-04 ENCOUNTER — Ambulatory Visit (HOSPITAL_BASED_OUTPATIENT_CLINIC_OR_DEPARTMENT_OTHER): Payer: Medicare Other | Admitting: Physical Therapy

## 2022-01-15 ENCOUNTER — Telehealth: Payer: Self-pay | Admitting: Family Medicine

## 2022-01-15 NOTE — Telephone Encounter (Signed)
Patient is calling and would like to have a referral sent to [facility] for her bone density. She also needs one for a detailed mammogram due to pain in her left breast.   The best call back number with any questions is [phone]

## 2022-01-15 NOTE — Telephone Encounter (Addendum)
LVM for pt to call office to see about getting her scheduled for an appointment so she can be evaluated for the mammogram she is requesting and to be sure that we order the correct order and document the findings in the order prior to her mammogram being performed.PLease assist in getting this scheduled if she calls back.  Rodderick Holtzer Zimmerman Rumple, CMA

## 2022-01-26 NOTE — Telephone Encounter (Signed)
Pt has an appt on 8/25 with Dr Gwendolyn Lima. Ottis Stain, CMA

## 2022-02-01 DIAGNOSIS — M25511 Pain in right shoulder: Secondary | ICD-10-CM | POA: Diagnosis not present

## 2022-02-01 DIAGNOSIS — M25812 Other specified joint disorders, left shoulder: Secondary | ICD-10-CM | POA: Diagnosis not present

## 2022-02-01 DIAGNOSIS — M25512 Pain in left shoulder: Secondary | ICD-10-CM | POA: Diagnosis not present

## 2022-02-02 ENCOUNTER — Ambulatory Visit: Payer: Medicare Other | Admitting: Family Medicine

## 2022-02-07 ENCOUNTER — Other Ambulatory Visit: Payer: Self-pay | Admitting: Family Medicine

## 2022-02-07 DIAGNOSIS — M75121 Complete rotator cuff tear or rupture of right shoulder, not specified as traumatic: Secondary | ICD-10-CM

## 2022-02-07 DIAGNOSIS — E785 Hyperlipidemia, unspecified: Secondary | ICD-10-CM

## 2022-02-07 DIAGNOSIS — H40012 Open angle with borderline findings, low risk, left eye: Secondary | ICD-10-CM | POA: Diagnosis not present

## 2022-02-07 DIAGNOSIS — M169 Osteoarthritis of hip, unspecified: Secondary | ICD-10-CM

## 2022-02-08 ENCOUNTER — Encounter: Payer: Self-pay | Admitting: Family Medicine

## 2022-02-08 ENCOUNTER — Ambulatory Visit (INDEPENDENT_AMBULATORY_CARE_PROVIDER_SITE_OTHER): Payer: Medicare Other | Admitting: Family Medicine

## 2022-02-08 VITALS — BP 124/80 | HR 73 | Temp 98.8°F | Ht 64.0 in | Wt 236.6 lb

## 2022-02-08 DIAGNOSIS — M940 Chondrocostal junction syndrome [Tietze]: Secondary | ICD-10-CM | POA: Insufficient documentation

## 2022-02-08 DIAGNOSIS — H6120 Impacted cerumen, unspecified ear: Secondary | ICD-10-CM | POA: Diagnosis not present

## 2022-02-08 HISTORY — DX: Chondrocostal junction syndrome (tietze): M94.0

## 2022-02-08 MED ORDER — IBUPROFEN 600 MG PO TABS: 600.0000 mg | ORAL_TABLET | Freq: Three times a day (TID) | ORAL | 0 refills | Status: AC | PRN

## 2022-02-08 NOTE — Progress Notes (Signed)
    SUBJECTIVE:   CHIEF COMPLAINT / HPI:   Breast pain Has had pain in both breasts for the last month. Feels like it's worse on the L side. It is tender when she presses around her breasts. Specifically, she feels pain underneath both breasts and some in the 2 o'clock position. She also has a history of rotator cuff problems, and she feels like this could be contributing. She has also had multiple mammograms in the past that were normal.  Ear irritation Feels like something is in her L ear. It is sometimes painful. She has used multiple instruments to try and remove anything from her ear without relief. She mentions there was a 1.5 mm lesion lateral to the carotid artery on past imaging; however, they followed up with a CT without evidence of perivascular abnormality. She would like to see an ENT because of these issues.  PERTINENT  PMH / PSH: UTI, T2DM, HLD, rotator cuff complete tear  OBJECTIVE:   BP 124/80   Pulse 73   Temp 98.8 F (37.1 C)   Ht '5\' 4"'$  (1.626 m)   Wt 236 lb 9.6 oz (107.3 kg)   SpO2 96%   BMI 40.61 kg/m   General: Alert and oriented, in NAD Skin: Warm, dry, and intact without lesions Breast/Chest: TTP along the chest wall under the beast as well as above the breast at the pectoralis insertion. No lumps, erythema, or increased warmth appreciated. HEENT: NCAT, EOM grossly normal, midline nasal septum. TM normal bilaterally. Appreciate cerumen matted to walls of ear canal bilaterally; cerumen darker in appearance on L side compared to R Cardiac: Regular rate Respiratory: Breathing and speaking comfortably on RA Abdominal: Nondistended Extremities: Moves all extremities grossly equally Neurological: No gross focal deficit Psychiatric: Appropriate mood and affect   ASSESSMENT/PLAN:   Costochondritis Pain to palpation along chest wall bilaterally likely costochondritis. Do not suspect breast pathology given bilateral nature of tenderness or cardiac/pulmonary  pathology given tenderness with palpation. Will send in ibuprofen 600 mg Q8 as needed for pain; will follow up as needed.  Cerumen in auditory canal on examination Cerumen matting to auditory canal could explain irritation patient is experiencing. No foreign body, erythema, pus, bleeding, or other abnormality seen on exam. Cerumen on L side was darker in color which could represent some old bleeding from local trauma after using instruments in her ears. Attempted cerumen clean out in the clinic with patient electing to stop given feeling of being underwater. Recommended Debrox drops for use in the future for cerumen removal. Will follow up as needed. Concerning the incidental lesion found on imaging but not seen on most recent CT, do not suspect a ENT cause, and unsure of etiology. Given this is likely not time-sensitive, will defer to PCP for follow up.   Health maintenance Patient asked for DEXA scan, though USPSTF guidelines recommend screening at age 70; discussed with patient.   Ethelene Hal, MD [place]

## 2022-02-08 NOTE — Patient Instructions (Signed)
It was great to see you today! Here's what we talked about:  I believe pain you are experiencing in both sides of your chest around your breasts is costochondritis. I have provided some more information about that to this packet. I have sent in some ibuprofen to your pharmacy. When we see you back, we will assess how you are doing. Try to get some Debrox drops at the pharmacy to clean out your ears. I believe this can help reduce some irritation you are feeling.  Please let me know if you have any other questions.  Dr. Marcha Dutton

## 2022-02-08 NOTE — Assessment & Plan Note (Addendum)
Cerumen matting to auditory canal could explain irritation patient is experiencing. No foreign body, erythema, pus, bleeding, or other abnormality seen on exam. Cerumen on L side was darker in color which could represent some old bleeding from local trauma after using instruments in her ears. Attempted cerumen clean out in the clinic with patient electing to stop given feeling of being underwater. Recommended Debrox drops for use in the future for cerumen removal. Will follow up as needed. Concerning the incidental lesion found on imaging but not seen on most recent CT, do not suspect a ENT cause, and unsure of etiology. Given this is likely not time-sensitive, will defer to PCP for follow up.

## 2022-02-08 NOTE — Assessment & Plan Note (Signed)
Pain to palpation along chest wall bilaterally likely costochondritis. Do not suspect breast pathology given bilateral nature of tenderness or cardiac/pulmonary pathology given tenderness with palpation. Will send in ibuprofen 600 mg Q8 as needed for pain; will follow up as needed.

## 2022-02-15 ENCOUNTER — Encounter (HOSPITAL_BASED_OUTPATIENT_CLINIC_OR_DEPARTMENT_OTHER): Payer: Self-pay | Admitting: Physical Therapy

## 2022-02-15 ENCOUNTER — Ambulatory Visit (HOSPITAL_BASED_OUTPATIENT_CLINIC_OR_DEPARTMENT_OTHER): Payer: Medicare Other | Attending: Orthopedic Surgery | Admitting: Physical Therapy

## 2022-02-15 DIAGNOSIS — R262 Difficulty in walking, not elsewhere classified: Secondary | ICD-10-CM

## 2022-02-15 DIAGNOSIS — M545 Low back pain, unspecified: Secondary | ICD-10-CM | POA: Diagnosis not present

## 2022-02-15 DIAGNOSIS — M6281 Muscle weakness (generalized): Secondary | ICD-10-CM | POA: Diagnosis not present
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
## 2022-02-19 NOTE — Progress Notes (Signed)
[facility] Quality Team Note  Name: SAVANNAH MORFORD Date of Birth: Oct 29, 1960 MRN: [id number] Date: 02/19/2022  [facility] Quality Team has reviewed this patient's chart, please see recommendations below:  [facility] Quality Other; ((KED) Kidney Health Evaluation Gap: Patient needs Urine Albumin-Creatinine Ratio (uACR) test completed, already has EGFR portion done. Patient has an upcoming appointment with [place] family 03/15/2022.)

## 2022-02-22 ENCOUNTER — Encounter (HOSPITAL_BASED_OUTPATIENT_CLINIC_OR_DEPARTMENT_OTHER): Payer: Self-pay | Admitting: Physical Therapy

## 2022-02-22 ENCOUNTER — Ambulatory Visit (HOSPITAL_BASED_OUTPATIENT_CLINIC_OR_DEPARTMENT_OTHER): Payer: Medicare Other | Admitting: Physical Therapy

## 2022-02-22 DIAGNOSIS — R262 Difficulty in walking, not elsewhere classified: Secondary | ICD-10-CM | POA: Diagnosis not present

## 2022-02-22 DIAGNOSIS — M545 Low back pain, unspecified: Secondary | ICD-10-CM | POA: Diagnosis not present

## 2022-02-22 DIAGNOSIS — M6281 Muscle weakness (generalized): Secondary | ICD-10-CM | POA: Diagnosis not present
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
## 2022-02-26 ENCOUNTER — Emergency Department (HOSPITAL_COMMUNITY): Payer: Medicare Other

## 2022-02-26 ENCOUNTER — Emergency Department (HOSPITAL_COMMUNITY)
Admission: EM | Admit: 2022-02-26 | Discharge: 2022-02-26 | Disposition: A | Discharge status: Home / Self Care | Payer: Medicare Other | Attending: Emergency Medicine | Admitting: Emergency Medicine

## 2022-02-26 ENCOUNTER — Encounter (HOSPITAL_COMMUNITY): Payer: Self-pay

## 2022-02-26 ENCOUNTER — Other Ambulatory Visit: Payer: Self-pay

## 2022-02-26 DIAGNOSIS — M25512 Pain in left shoulder: Secondary | ICD-10-CM | POA: Diagnosis not present

## 2022-02-26 DIAGNOSIS — Z7984 Long term (current) use of oral hypoglycemic drugs: Secondary | ICD-10-CM | POA: Insufficient documentation

## 2022-02-26 DIAGNOSIS — U071 COVID-19: Secondary | ICD-10-CM | POA: Diagnosis not present

## 2022-02-26 DIAGNOSIS — E1140 Type 2 diabetes mellitus with diabetic neuropathy, unspecified: Secondary | ICD-10-CM | POA: Insufficient documentation

## 2022-02-26 DIAGNOSIS — R0789 Other chest pain: Secondary | ICD-10-CM | POA: Diagnosis not present

## 2022-02-26 DIAGNOSIS — R079 Chest pain, unspecified: Secondary | ICD-10-CM | POA: Diagnosis not present

## 2022-02-26 DIAGNOSIS — R509 Fever, unspecified: Secondary | ICD-10-CM | POA: Diagnosis not present

## 2022-02-26 DIAGNOSIS — R0981 Nasal congestion: Secondary | ICD-10-CM | POA: Diagnosis present

## 2022-02-26 DIAGNOSIS — R059 Cough, unspecified: Secondary | ICD-10-CM | POA: Diagnosis not present

## 2022-02-26 LAB — BASIC METABOLIC PANEL
Anion gap: 11 (ref 5–15)
BUN: 7 mg/dL — ABNORMAL LOW (ref 8–23)
CO2: 24 mmol/L (ref 22–32)
Calcium: 8.6 mg/dL — ABNORMAL LOW (ref 8.9–10.3)
Chloride: 104 mmol/L (ref 98–111)
Creatinine, Ser: 0.95 mg/dL (ref 0.44–1.00)
GFR, Estimated: >60 mL/min (ref >60)
Glucose, Bld: 208 mg/dL — ABNORMAL HIGH (ref 70–99)
Potassium: 3.4 mmol/L — ABNORMAL LOW (ref 3.5–5.1)
Sodium: 139 mmol/L (ref 135–145)

## 2022-02-26 LAB — CBC WITH DIFFERENTIAL/PLATELET
Abs Immature Granulocytes: 0.01 10*3/uL (ref 0.00–0.07)
Basophils Absolute: 0 10*3/uL (ref 0.0–0.1)
Basophils Relative: 1 %
Eosinophils Absolute: 0 10*3/uL (ref 0.0–0.5)
Eosinophils Relative: 1 %
HCT: 41.1 % (ref 36.0–46.0)
Hemoglobin: 12.5 g/dL (ref 12.0–15.0)
Immature Granulocytes: 0 %
Lymphocytes Relative: 24 %
Lymphs Abs: 1.1 10*3/uL (ref 0.7–4.0)
MCH: 26 pg (ref 26.0–34.0)
MCHC: 30.4 g/dL (ref 30.0–36.0)
MCV: 85.4 fL (ref 80.0–100.0)
Monocytes Absolute: 0.9 10*3/uL (ref 0.1–1.0)
Monocytes Relative: 20 %
Neutro Abs: 2.6 10*3/uL (ref 1.7–7.7)
Neutrophils Relative %: 54 %
Platelets: 224 10*3/uL (ref 150–400)
RBC: 4.81 MIL/uL (ref 3.87–5.11)
RDW: 14.6 % (ref 11.5–15.5)
WBC: 4.7 10*3/uL (ref 4.0–10.5)
nRBC: 0 % (ref 0.0–0.2)

## 2022-02-26 LAB — SARS CORONAVIRUS 2 BY RT PCR: SARS Coronavirus 2 by RT PCR: POSITIVE — AB

## 2022-02-26 LAB — TROPONIN I (HIGH SENSITIVITY): Troponin I (High Sensitivity): 5 ng/L (ref <18)

## 2022-02-26 MED ORDER — NIRMATRELVIR/RITONAVIR (PAXLOVID)TABLET: 3.0000 | ORAL_TABLET | Freq: Two times a day (BID) | ORAL | 0 refills | Status: AC

## 2022-02-26 MED ORDER — ACETAMINOPHEN 325 MG PO TABS: 650.0000 mg | ORAL_TABLET | Freq: Once | ORAL | Status: DC

## 2022-02-26 MED ORDER — BENZONATATE 100 MG PO CAPS: 100.0000 mg | ORAL_CAPSULE | Freq: Three times a day (TID) | ORAL | 0 refills | Status: DC | PRN

## 2022-02-26 NOTE — ED Triage Notes (Signed)
Congestion, cough since yesterday and her job wants her tested for covid.

## 2022-02-26 NOTE — Discharge Instructions (Addendum)
Continue supportive care at home.  This includes staying hydrated and taking ibuprofen and/or Tylenol for relief of symptoms.    There was a antiviral medication that was sent to your pharmacy called Paxlovid.  This is a medication that can limit the symptoms of your current COVID-19 infection.  Take as prescribed for the next 5 days.  While you are taking Paxlovid, stop taking your rosuvastatin.  You can resume taking rosuvastatin after you have completed your course of Paxlovid.    There was also prescription sent for a cough suppressant.  Take this only as needed.  Return to the emergency department for any new or worsening symptoms of concern.

## 2022-02-26 NOTE — ED Provider Triage Note (Signed)
Emergency Medicine Provider Triage Evaluation Note  Alyssa Montgomery , a 61 y.o. female  was evaluated in triage.  Pt complains of cough, chest tightness.  Initially told nursing she was here for a COVID test, due to cough and congestion.  When I saw patient she states that she is having central chest pain, cough and shortness of breath.  She also complains of some chronic left shoulder pain.  She supposed to have replacement by [facility].  No lower extremity swelling.  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  Appropriate orders placed.  Alyssa Montgomery was informed that the remainder of the evaluation will be completed by another provider, this initial triage assessment does not replace that evaluation, and the importance of remaining in the ED until their evaluation is complete.  Cough, cp   Ettore Trebilcock A, PA-C 02/26/22 1241

## 2022-02-26 NOTE — ED Provider Notes (Signed)
[facility] EMERGENCY DEPARTMENT Provider Note   CSN: [id number] Arrival date & time: 02/26/22  1220     History  Chief Complaint  Patient presents with   URI    Alyssa Montgomery is a 61 y.o. female.   URI Presenting symptoms: congestion, cough and ear pain   Associated symptoms: arthralgias (Chronic)   Patient presents for flulike symptoms.  Medical history includes arthritis, GERD, DM, neuropathy, HLD, obesity, TIA.  She works as a Quarry manager.  Yesterday, she developed sinus congestion, shortness of breath, fullness in ears, and cough.  Patient denies any other new symptoms.  She does have ongoing left shoulder pain from arthritis.  She is scheduled to have a complete shoulder replacement with orthopedic surgery.     Home Medications Prior to Admission medications   Medication Sig Start Date End Date Taking? Authorizing Provider  benzonatate (TESSALON) 100 MG capsule Take 1 capsule (100 mg total) by mouth 3 (three) times daily as needed for cough. 02/26/22  Yes Godfrey Pick, MD  nirmatrelvir/ritonavir EUA (PAXLOVID) 20 x 150 MG & 10 x '100MG'$  TABS Take 3 tablets by mouth 2 (two) times daily for 5 days. Patient GFR is >60. Take nirmatrelvir (150 mg) two tablets twice daily for 5 days and ritonavir (100 mg) one tablet twice daily for 5 days. 02/26/22 03/03/22 Yes Godfrey Pick, MD  albuterol (VENTOLIN HFA) 108 (90 Base) MCG/ACT inhaler INHALE 2 PUFFS INTO THE LUNGS EVERY 6 HOURS AS NEEDED FOR WHEEZING/SHORTNESS OF BREATH Patient taking differently: Inhale 2 puffs into the lungs every 6 (six) hours as needed for wheezing or shortness of breath. 05/31/21   Ezequiel Essex, MD  alum & mag hydroxide-simeth (MAALOX MAX) [phone] MG/5ML suspension Take 10 mLs by mouth every 6 (six) hours as needed for indigestion. 08/16/21   Horton, Alvin Critchley, DO  bismuth subsalicylate (PEPTO BISMOL) 262 MG/15ML suspension Take 30 mLs by mouth every 6 (six) hours as needed for indigestion or diarrhea or  loose stools.    [provider]  dextromethorphan 15 MG/5ML syrup Take 10 mLs (30 mg total) by mouth 4 (four) times daily as needed for cough. Patient not taking: Reported on 06/29/2021 06/22/21   Ezequiel Essex, MD  ECHINACEA PO Take 1 capsule by mouth daily.    [provider]  erythromycin ophthalmic ointment Place 1 application. into the left eye at bedtime. 08/04/21   [provider]  fluticasone (FLONASE) 50 MCG/ACT nasal spray PLACE 1 SPRAY INTO BOTH NOSTRILS DAILY AS NEEDED FOR ALLERGIES OR RHINITIS. Patient taking differently: Place 2 sprays into both nostrils daily. 05/02/21   Ezequiel Essex, MD  guaiFENesin (MUCINEX PO) Take 1 Dose by mouth 2 (two) times daily as needed (cough).    [provider]  guaiFENesin (MUCINEX) 600 MG 12 hr tablet Take 1 tablet (600 mg total) by mouth 2 (two) times daily as needed for to loosen phlegm or cough. Patient not taking: Reported on 08/15/2021 05/31/21   Ezequiel Essex, MD  guaiFENesin (ROBITUSSIN) 100 MG/5ML liquid Take 5 mLs by mouth every 4 (four) hours as needed for cough or to loosen phlegm.    [provider]  ipratropium (ATROVENT) 0.06 % nasal spray Place 2 sprays into both nostrils 4 (four) times daily. Patient taking differently: Place 2 sprays into both nostrils at bedtime. 06/22/21   Ezequiel Essex, MD  metFORMIN (GLUCOPHAGE-XR) 500 MG 24 hr tablet Take 2 tablets (1,000 mg total) by mouth every evening. 11/16/21   Autry-Lott, Naaman Plummer,  DO  naproxen (NAPROSYN) 500 MG tablet TAKE 1 TABLET BY MOUTH TWICE A DAY WITH FOOD 02/08/22   Ezequiel Essex, MD  Omega 3 1000 MG CAPS Take 1,000 mg by mouth daily.    [provider]  omeprazole (PRILOSEC) 20 MG capsule Take 1 capsule (20 mg total) by mouth daily. 08/16/21   Horton, Alvin Critchley, DO  ondansetron (ZOFRAN) 4 MG tablet Take 1 tablet (4 mg total) by mouth every 8 (eight) hours as needed for nausea or vomiting. 08/16/21   Horton, Alvin Critchley, DO   rosuvastatin (CRESTOR) 5 MG tablet Take 1 tablet (5 mg total) by mouth daily. 12/15/21   Zola Button, MD  Simethicone (GAS-X PO) Take 2 tablets by mouth every 6 (six) hours as needed (stomach pain).    [provider]  zinc gluconate 50 MG tablet Take 50 mg by mouth daily.    [provider]      Allergies    Zanaflex [tizanidine hcl], Penicillins, and Sulfonamide derivatives    Review of Systems   Review of Systems  HENT:  Positive for congestion, ear pain and sinus pressure.   Respiratory:  Positive for cough and shortness of breath.   Musculoskeletal:  Positive for arthralgias (Chronic).  All other systems reviewed and are negative.   Physical Exam Updated Vital Signs BP 117/78 (BP Location: Right Arm)   Pulse 80   Temp 98.8 F (37.1 C)   Resp 19   Ht '5\' 4"'$  (1.626 m)   Wt 107 kg   SpO2 98%   BMI 40.51 kg/m  Physical Exam Vitals and nursing note reviewed.  Constitutional:      General: She is not in acute distress.    Appearance: Normal appearance. She is well-developed. She is not ill-appearing, toxic-appearing or diaphoretic.  HENT:     Head: Normocephalic and atraumatic.     Right Ear: Tympanic membrane, ear canal and external ear normal.     Left Ear: Tympanic membrane, ear canal and external ear normal.     Nose: Congestion present.     Mouth/Throat:     Mouth: Mucous membranes are moist.  Eyes:     Extraocular Movements: Extraocular movements intact.     Conjunctiva/sclera: Conjunctivae normal.  Cardiovascular:     Rate and Rhythm: Normal rate and regular rhythm.     Heart sounds: No murmur heard. Pulmonary:     Effort: Pulmonary effort is normal. No respiratory distress.     Breath sounds: Normal breath sounds. No wheezing or rales.  Chest:     Chest wall: No tenderness.  Abdominal:     Palpations: Abdomen is soft.     Tenderness: There is no abdominal tenderness.  Musculoskeletal:        General: No swelling.     Cervical back:  Normal range of motion and neck supple.     Right lower leg: No edema.     Left lower leg: No edema.  Skin:    General: Skin is warm and dry.     Capillary Refill: Capillary refill takes less than 2 seconds.     Coloration: Skin is not jaundiced or pale.  Neurological:     General: No focal deficit present.     Mental Status: She is alert and oriented to person, place, and time.     Cranial Nerves: No cranial nerve deficit.     Sensory: No sensory deficit.     Motor: No weakness.  Coordination: Coordination normal.  Psychiatric:        Mood and Affect: Mood normal.        Behavior: Behavior normal.     ED Results / Procedures / Treatments   Labs (all labs ordered are listed, but only abnormal results are displayed) Labs Reviewed  SARS CORONAVIRUS 2 BY RT PCR - Abnormal; Notable for the following components:      Result Value   SARS Coronavirus 2 by RT PCR POSITIVE (*)    All other components within normal limits  BASIC METABOLIC PANEL - Abnormal; Notable for the following components:   Potassium 3.4 (*)    Glucose, Bld 208 (*)    BUN 7 (*)    Calcium 8.6 (*)    All other components within normal limits  CBC WITH DIFFERENTIAL/PLATELET  TROPONIN I (HIGH SENSITIVITY)  TROPONIN I (HIGH SENSITIVITY)    EKG EKG Interpretation  Date/Time:  Monday February 26 2022 12:46:35 EDT Ventricular Rate:  88 PR Interval:  136 QRS Duration: 102 QT Interval:  378 QTC Calculation: 457 R Axis:   -41 Text Interpretation: Normal sinus rhythm Left axis deviation No significant change since last tracing Confirmed by Godfrey Pick [phone]) on 02/26/2022 5:32:25 PM  Radiology DG Chest 2 View  Result Date: 02/26/2022 CLINICAL DATA:  51 year [place] female with cough and fever, reportedly also with severe LEFT shoulder pain. EXAM: CHEST - 2 VIEW COMPARISON:  August 15 2021 FINDINGS: Subtle LEFT basilar airspace disease just above the LEFT hemidiaphragm. No signs of lobar consolidation, pneumothorax  or pleural effusion. Trachea midline. Cardiomediastinal contours and hilar structures are normal. On limited assessment no acute skeletal findings. IMPRESSION: Subtle LEFT basilar airspace disease could represent atelectasis. No sign of effusion. Electronically Signed   By: Zetta Bills M.D.   On: 02/26/2022 13:12    Procedures Procedures    Medications Ordered in ED Medications  acetaminophen (TYLENOL) tablet 650 mg (650 mg Oral Patient Refused/Not Given 02/26/22 1829)    ED Course/ Medical Decision Making/ A&P                           Medical Decision Making Amount and/or Complexity of Data Reviewed Radiology: ordered.  Risk OTC drugs. Prescription drug management.   Patient presents for flulike symptoms since yesterday.  This includes cough, congestion, shortness of breath.  On arrival in the ED, patient is afebrile with normal vital signs.  Her breathing is unlabored.  Her SPO2 is 90% on room air.  Her lungs are clear to auscultation.  Prior to being bedded in the ED, diagnostic work-up was initiated.  Patient was found to have COVID-19.  This does explain her symptoms.  Remaining laboratory work-up were reassuring.  Chest x-ray shows a subtle left basilar airspace opacity which is likely atelectasis.  Given very recent onset of symptoms, I do not suspect pneumonia.  Patient does have chronic left shoulder pain secondary to arthritis.  She does have replacement surgery scheduled with orthopedic surgery.  She reports recent worsening of this pain and initially was agreeable to a left shoulder x-ray.  This was ordered in addition to a sling for comfort.  Tylenol was ordered for analgesia. EKG shows no significant change from prior tracings.  Initial troponin was normal.  I do not suspect referred pain from cardiac etiology.  While in the ED, patient declined the x-ray and stated that she does feel ready to go home.  She did request  antiviral therapy and Paxlovid was prescribed.  She was  discharged in good condition.        Final Clinical Impression(s) / ED Diagnoses Final diagnoses:  COVID-19    Rx / DC Orders ED Discharge Orders          Ordered    nirmatrelvir/ritonavir EUA (PAXLOVID) 20 x 150 MG & 10 x '100MG'$  TABS  2 times daily        02/26/22 1827    benzonatate (TESSALON) 100 MG capsule  3 times daily PRN        02/26/22 1827              Godfrey Pick, MD 02/26/22 [phone]

## 2022-02-27 ENCOUNTER — Telehealth: Payer: Self-pay

## 2022-02-27 ENCOUNTER — Other Ambulatory Visit: Payer: Self-pay | Admitting: Family Medicine

## 2022-02-27 DIAGNOSIS — M169 Osteoarthritis of hip, unspecified: Secondary | ICD-10-CM

## 2022-02-27 DIAGNOSIS — J3089 Other allergic rhinitis: Secondary | ICD-10-CM

## 2022-02-27 DIAGNOSIS — M75121 Complete rotator cuff tear or rupture of right shoulder, not specified as traumatic: Secondary | ICD-10-CM

## 2022-02-27 NOTE — Patient Outreach (Signed)
  Care Coordination TOC Note Transition Care Management Follow-up Telephone Call Date of discharge and from where: [facility] 02[facility]26/22 How have you been since you were released from the hospital? "I have lost my smell and taste and the cough is terrible.  Started Paxlovid last evening and will take more today." Any questions or concerns? Yes- Patient was concerned as the ED gave her a note to be out of work for 7 days and she needs a negative test to return to work.  Patient does not want to come back to ED for repeat test.  This RNCM made some called to [facility] and was told she could retest there but she would need an appointment with the NP to retest.  Recommended she take test at home that is negative prior to going to [facility] as sometimes the covid test will be positive for several weeks.  Patient expressed appreciation and will contact her PCP's office if she has concerns/questions.  Items Reviewed: Did the pt receive and understand the discharge instructions provided? Yes  Medications obtained and verified? Yes  Other? No  Any new allergies since your discharge? No  Dietary orders reviewed? Yes Do you have support at home? Yes   Home Care and Equipment/Supplies: Were home health services ordered? no If so, what is the name of the agency? N/A  Has the agency set up a time to come to the patient's home? N/A Were any new equipment or medical supplies ordered?  No What is the name of the medical supply agency? N/A Were you able to get the supplies/equipment? not applicable Do you have any questions related to the use of the equipment or supplies? No  Functional Questionnaire: (I = Independent and D = Dependent) ADLs: I  Bathing/Dressing- I  Meal Prep- I  Eating- I  Maintaining continence- I  Transferring/Ambulation- I  Managing Meds- I  Follow up appointments reviewed:  PCP Hospital f/u appt confirmed? No   Specialist Hospital f/u appt confirmed? No    Are transportation arrangements needed? No  If their condition worsens, is the pt aware to call PCP or go to the Emergency Dept.? Yes Was the patient provided with contact information for the PCP's office or ED? Yes Was to pt encouraged to call back with questions or concerns? Yes  SDOH assessments and interventions completed:   Yes  Care Coordination Interventions Activated:  Yes   Care Coordination Interventions:  No Care Coordination interventions needed at this time.   Encounter Outcome:  Pt. Visit Completed

## 2022-03-01 ENCOUNTER — Ambulatory Visit (HOSPITAL_BASED_OUTPATIENT_CLINIC_OR_DEPARTMENT_OTHER): Payer: Medicare Other | Admitting: Physical Therapy

## 2022-03-01 ENCOUNTER — Encounter (HOSPITAL_BASED_OUTPATIENT_CLINIC_OR_DEPARTMENT_OTHER): Payer: Self-pay

## 2022-03-07 ENCOUNTER — Telehealth: Payer: Self-pay

## 2022-03-07 NOTE — Telephone Encounter (Signed)
     Patient  visit on 9/18  at [facility]  Have you been able to follow up with your primary care physician?yes  The patient was or was not able to obtain any needed medicine or equipment.yes  Are there diet recommendations that you are having difficulty following?na  Patient expresses understanding of discharge instructions and education provided has no other needs at this time. yes     [place], Care Management  [phone] [address], [place], [place] [address] Phone: [phone] Email: [email or web address]

## 2022-03-08 ENCOUNTER — Ambulatory Visit: Payer: Medicare Other | Admitting: Family Medicine

## 2022-03-08 ENCOUNTER — Ambulatory Visit (HOSPITAL_BASED_OUTPATIENT_CLINIC_OR_DEPARTMENT_OTHER): Payer: Medicare Other | Admitting: Physical Therapy

## 2022-03-12 ENCOUNTER — Other Ambulatory Visit: Payer: Self-pay | Admitting: Family Medicine

## 2022-03-12 DIAGNOSIS — Z1231 Encounter for screening mammogram for malignant neoplasm of breast: Secondary | ICD-10-CM

## 2022-03-13 DIAGNOSIS — Z96642 Presence of left artificial hip joint: Secondary | ICD-10-CM | POA: Diagnosis not present

## 2022-03-13 DIAGNOSIS — M25551 Pain in right hip: Secondary | ICD-10-CM | POA: Diagnosis not present

## 2022-03-13 DIAGNOSIS — M1611 Unilateral primary osteoarthritis, right hip: Secondary | ICD-10-CM | POA: Diagnosis not present

## 2022-03-15 ENCOUNTER — Ambulatory Visit (INDEPENDENT_AMBULATORY_CARE_PROVIDER_SITE_OTHER): Payer: Medicare Other | Admitting: Family Medicine

## 2022-03-15 ENCOUNTER — Encounter: Payer: Self-pay | Admitting: Family Medicine

## 2022-03-15 ENCOUNTER — Ambulatory Visit (HOSPITAL_BASED_OUTPATIENT_CLINIC_OR_DEPARTMENT_OTHER): Payer: Medicare Other | Admitting: Physical Therapy

## 2022-03-15 VITALS — BP 122/68 | HR 78 | Ht 64.0 in | Wt 235.0 lb

## 2022-03-15 DIAGNOSIS — J3089 Other allergic rhinitis: Secondary | ICD-10-CM | POA: Diagnosis not present

## 2022-03-15 DIAGNOSIS — E785 Hyperlipidemia, unspecified: Secondary | ICD-10-CM | POA: Diagnosis not present

## 2022-03-15 DIAGNOSIS — M13812 Other specified arthritis, left shoulder: Secondary | ICD-10-CM | POA: Diagnosis not present

## 2022-03-15 DIAGNOSIS — M75121 Complete rotator cuff tear or rupture of right shoulder, not specified as traumatic: Secondary | ICD-10-CM | POA: Diagnosis not present

## 2022-03-15 DIAGNOSIS — Z87891 Personal history of nicotine dependence: Secondary | ICD-10-CM

## 2022-03-15 DIAGNOSIS — Z01818 Encounter for other preprocedural examination: Secondary | ICD-10-CM

## 2022-03-15 DIAGNOSIS — M25511 Pain in right shoulder: Secondary | ICD-10-CM

## 2022-03-15 DIAGNOSIS — E1169 Type 2 diabetes mellitus with other specified complication: Secondary | ICD-10-CM | POA: Diagnosis not present

## 2022-03-15 DIAGNOSIS — R252 Cramp and spasm: Secondary | ICD-10-CM | POA: Diagnosis not present

## 2022-03-15 DIAGNOSIS — E876 Hypokalemia: Secondary | ICD-10-CM

## 2022-03-15 DIAGNOSIS — E119 Type 2 diabetes mellitus without complications: Secondary | ICD-10-CM

## 2022-03-15 MED ORDER — FLUTICASONE PROPIONATE 50 MCG/ACT NA SUSP: 1.0000 | Freq: Every day | NASAL | 2 refills | Status: DC

## 2022-03-15 MED ORDER — TRAMADOL HCL 50 MG PO TABS: 50.0000 mg | ORAL_TABLET | Freq: Four times a day (QID) | ORAL | 0 refills | Status: DC | PRN

## 2022-03-15 MED ORDER — LIDOCAINE 5 % EX PTCH: 1.0000 | MEDICATED_PATCH | CUTANEOUS | 3 refills | Status: DC

## 2022-03-15 NOTE — Patient Instructions (Signed)
It was wonderful to see you today. Thank you for allowing me to be a part of your care. Below is a short summary of what we discussed at your visit today:  Preoperative labs We will obtain some labs including your A1c near the end of the month.  They are ordered as a future lab.  Simply call ahead and book a lab only appointment. If the results are normal, I will send you a letter or MyChart message. If the results are abnormal, I will give you a call.  Once labs come back normal, I will sign your preoperative paperwork and send it to your surgeon.  Low-dose CT lung scan I have ordered this through the [facility] imaging center at the [facility].  Please call them directly to schedule an appointment.    Please bring all of your medications to every appointment!  If you have any questions or concerns, please do not hesitate to contact us via phone or MyChart message.   Ezequiel Essex, MD

## 2022-03-15 NOTE — Progress Notes (Signed)
SUBJECTIVE:   CHIEF COMPLAINT / HPI:   Pre-Op Evaluation Slated to undergo left reverse total shoulder replacement 05/11/2022 with Dr. Netta Cedars. PMH includes right rotator cuff tear, T2DM, HLD, BMI 40, peripheral polyneuropathy, pituitary abnormality, history of tobacco use.   Blood pressure: No diagnosis of hypertension, not on any medications for blood pressure.  Blood pressures well controlled, last 3 measurements below.  BP Readings from Last 3 Encounters:  03/15/22 122/68  02/26/22 115/78  02/08/22 124/80    Type 2 diabetes: Currently well controlled.  Prescribed metformin XR 1000 mg nightly, although patient has been taking half a tablet nightly out of suspicion that metformin combined with her statin were giving her severe muscle cramps.    Lab Results  Component Value Date   HGBA1C 7.3 (A) 12/29/2021   HGBA1C 6.7 05/31/2021   HGBA1C 6.8 01/17/2021   Lab Results  Component Value Date   MICROALBUR 30 07/13/2019   LDLCALC 106 (H) 10/26/2021   CREATININE 0.95 02/26/2022   Hyperlipidemia: Most recent lipid panel values below.  ASCVD risk 11.5%, calculated below.  Was previously on atorvastatin, however stopped given severe muscle cramps.  She was prescribed rosuvastatin 5 mg daily 12/15/2021 by Dr. Nancy Fetter, however she has not yet started these out of fear for muscle cramps. She has started a supplement called Moringa daily that is supposed to help with cholesterol in women.  Lab Results  Component Value Date   CHOL 200 (H) 10/26/2021   HDL 68 10/26/2021   LDLCALC 106 (H) 10/26/2021   LDLDIRECT 127 (H) 12/15/2021   TRIG 150 (H) 10/26/2021   CHOLHDL 2.9 10/26/2021   The 10-year ASCVD risk score (Arnett DK, et al., 2019) is: 11.5%   Values used to calculate the score:     Age: 61 years     Sex: Female     Is Non-Hispanic African American: Yes     Diabetic: Yes     Tobacco smoker: No     Systolic Blood Pressure: 195 mmHg     Is BP treated: No     HDL Cholesterol:  68 mg/dL     Total Cholesterol: 200 mg/dL  PERTINENT  PMH / PSH: Right rotator cuff tear, T2DM, HLD, BMI 40, peripheral polyneuropathy, pituitary abnormality, history of tobacco use  OBJECTIVE:   BP 122/68   Pulse 78   Ht '5\' 4"'$  (1.626 m)   Wt 235 lb (106.6 kg)   SpO2 98%   BMI 40.34 kg/m    PHQ-9:     03/15/2022    4:26 PM 12/15/2021   10:33 AM 05/31/2021    3:32 PM  Depression screen PHQ 2/9  Decreased Interest 0 1 0  Down, Depressed, Hopeless 0 0 0  PHQ - 2 Score 0 1 0  Altered sleeping 0 1 2  Tired, decreased energy 0 1 0  Change in appetite 0 1 0  Feeling bad or failure about yourself  0 0 0  Trouble concentrating 0 0 0  Moving slowly or fidgety/restless 0 0 0  Suicidal thoughts 0 0 0  PHQ-9 Score 0 4 2  Difficult doing work/chores   Not difficult at all    Physical Exam General: Awake, alert, oriented HEENT: Mallampati score of 3 Cardiovascular: Regular rate and rhythm, S1 and S2 present, no murmurs auscultated, brisk cap refill and normal skin turgor Respiratory: Lung fields clear to auscultation bilaterally Neuro: Cranial nerves II through X grossly intact, able to move all extremities spontaneously  ASSESSMENT/PLAN:   History of tobacco use Patient requests CT screening for lung cancer.  Reports 40-pack-year history, quit November 2020.  No symptoms.  Will order low-dose CT today.   Hyperlipidemia associated with type 2 diabetes mellitus ([place]) Patient has not yet started her rosuvastatin 5 mg prescribed in July given fear of muscle cramps.  Discussed optimization for elective surgery, would recommend she start this today.  Patient amenable, will start.  Plan for LDL check with other preop blood work.  Diabetes mellitus without complication ([place]) Well-controlled currently on metformin.  Patient is prescribed metformin XR 1000 mg nightly, although she has been taking half a tablet nightly.  She believes the combination of metformin and atorvastatin gave her the  muscle cramps.  Recommended she go up again to 1 full tablet nightly as originally prescribed.  We will recheck A1c with other preop labs when it is due near the end of the month.  Pre-operative clearance Patient scheduled for left total reverse shoulder replacement 05/11/2022 with Dr. Veverly Fells.  Using NSQIP preoperative scoring tool, this patient is below average risk for this low risk surgery.  She does have average risk for surgical site infection, all other outcomes are predicted to be below average risk.  Once we get her blood work results back at the end of the month, I will sign and send in her preoperative evaluation form from the surgeon.     Ezequiel Essex, MD [place]

## 2022-03-16 NOTE — Assessment & Plan Note (Signed)
Patient requests CT screening for lung cancer.  Reports 40-pack-year history, quit November 2020.  No symptoms.  Will order low-dose CT today.

## 2022-03-16 NOTE — Assessment & Plan Note (Signed)
Patient scheduled for left total reverse shoulder replacement 05/11/2022 with Dr. Veverly Fells.  Using NSQIP preoperative scoring tool, this patient is below average risk for this low risk surgery.  She does have average risk for surgical site infection, all other outcomes are predicted to be below average risk.  Once we get her blood work results back at the end of the month, I will sign and send in her preoperative evaluation form from the surgeon.

## 2022-03-16 NOTE — Assessment & Plan Note (Signed)
Well-controlled currently on metformin.  Patient is prescribed metformin XR 1000 mg nightly, although she has been taking half a tablet nightly.  She believes the combination of metformin and atorvastatin gave her the muscle cramps.  Recommended she go up again to 1 full tablet nightly as originally prescribed.  We will recheck A1c with other preop labs when it is due near the end of the month.

## 2022-03-16 NOTE — Assessment & Plan Note (Signed)
Patient has not yet started her rosuvastatin 5 mg prescribed in July given fear of muscle cramps.  Discussed optimization for elective surgery, would recommend she start this today.  Patient amenable, will start.  Plan for LDL check with other preop blood work.

## 2022-03-22 ENCOUNTER — Ambulatory Visit (HOSPITAL_BASED_OUTPATIENT_CLINIC_OR_DEPARTMENT_OTHER): Payer: Medicare Other | Admitting: Physical Therapy

## 2022-03-22 ENCOUNTER — Encounter (HOSPITAL_BASED_OUTPATIENT_CLINIC_OR_DEPARTMENT_OTHER): Payer: Self-pay

## 2022-03-22 ENCOUNTER — Telehealth: Payer: Self-pay

## 2022-03-22 DIAGNOSIS — R300 Dysuria: Secondary | ICD-10-CM

## 2022-03-22 NOTE — Telephone Encounter (Signed)
Called patient. She states that she is unable to provide sample today. However, she will come to office tomorrow during her lunch to provide sample.   Please place future orders.   Thanks.   Talbot Grumbling, RN

## 2022-03-22 NOTE — Telephone Encounter (Signed)
Patient calls nurse line requesting antibiotic for UTI. She reports that she has been having symptoms for three days. She complains of back pain, urinary frequency and odor.   She denies blood in urine, fever, chills or pain with urination. Reports slight burning.   Advised patient that she would likely need an appointment in order to further evaluate. Patient states that she is unable to schedule appointment at this time due to work schedule. She states that she would be able to come in and drop off urine sample during her break.   Red flags discussed. Please advise how you would like patient to proceed.   Talbot Grumbling, RN

## 2022-03-23 DIAGNOSIS — R300 Dysuria: Secondary | ICD-10-CM | POA: Diagnosis not present

## 2022-03-23 NOTE — Addendum Note (Signed)
Addended by: [place] Pink on: 03/23/2022 02:00 PM   Modules accepted: Orders

## 2022-03-24 LAB — MICROSCOPIC EXAMINATION
Bacteria, UA: NONE SEEN
Casts: NONE SEEN /lpf
WBC, UA: >30 /hpf — AB (ref 0–5)

## 2022-03-24 LAB — URINALYSIS, ROUTINE W REFLEX MICROSCOPIC
Bilirubin, UA: NEGATIVE
Glucose, UA: NEGATIVE
Ketones, UA: NEGATIVE
Nitrite, UA: NEGATIVE
RBC, UA: NEGATIVE
Specific Gravity, UA: >=1.03 — AB (ref 1.005–1.030)
Urobilinogen, Ur: 0.2 mg/dL (ref 0.2–1.0)
pH, UA: 5.5 (ref 5.0–7.5)

## 2022-03-26 ENCOUNTER — Encounter: Payer: Self-pay | Admitting: Family Medicine

## 2022-03-26 ENCOUNTER — Telehealth: Payer: Self-pay | Admitting: Family Medicine

## 2022-03-26 DIAGNOSIS — N3001 Acute cystitis with hematuria: Secondary | ICD-10-CM

## 2022-03-26 MED ORDER — CEPHALEXIN 500 MG PO CAPS: 500.0000 mg | ORAL_CAPSULE | Freq: Two times a day (BID) | ORAL | 0 refills | Status: AC

## 2022-03-26 NOTE — Telephone Encounter (Signed)
Patient LVM on nurse line requesting results of urine sample.   Will forward to PCP.

## 2022-03-26 NOTE — Telephone Encounter (Signed)
Will send prescription for Keflex 500 mg twice daily x7 days.  Presumptive treatment of UTI given pyuria and symptoms.  Will await urine culture, if resistant bacteria will adjust antibiotic.  Patient also inquired about vaginal dryness and solutions for this.  She is currently been using baby oil and believes this is why she is having so many UTIs this year.  Discussed OTC nonhormonal vaginal gels.  Will provide list via [place].  Ezequiel Essex, MD

## 2022-03-26 NOTE — Telephone Encounter (Signed)
-----   Message from Martyn Malay, MD sent at 03/26/2022  9:21 AM EDT -----  ----- Message ----- From: Lavone Neri Lab Results In Sent: 03/24/2022   8:13 AM EDT To: Martyn Malay, MD

## 2022-03-27 LAB — URINE CULTURE

## 2022-03-28 ENCOUNTER — Other Ambulatory Visit (HOSPITAL_COMMUNITY): Payer: Self-pay

## 2022-03-29 ENCOUNTER — Ambulatory Visit (HOSPITAL_BASED_OUTPATIENT_CLINIC_OR_DEPARTMENT_OTHER): Payer: Medicare Other | Admitting: Physical Therapy

## 2022-04-03 NOTE — Progress Notes (Unsigned)
    SUBJECTIVE:   CHIEF COMPLAINT / HPI:   Vaginal burning- urine culture previously with e coli < 50K and mixed urogential flora <50k. Took a course of keflex and no improvement.   PERTINENT  PMH / PSH: ***  OBJECTIVE:   There were no vitals taken for this visit.  ***  ASSESSMENT/PLAN:   No problem-specific Assessment & Plan notes found for this encounter.     Lenoria Chime, MD [place]

## 2022-04-04 ENCOUNTER — Ambulatory Visit (INDEPENDENT_AMBULATORY_CARE_PROVIDER_SITE_OTHER): Payer: Medicare Other | Admitting: Family Medicine

## 2022-04-04 ENCOUNTER — Other Ambulatory Visit (HOSPITAL_COMMUNITY)
Admission: RE | Admit: 2022-04-04 | Discharge: 2022-04-04 | Disposition: A | Discharge status: Home / Self Care | Payer: Medicare Other | Source: Ambulatory Visit | Attending: Family Medicine | Admitting: Family Medicine

## 2022-04-04 VITALS — BP 106/62 | HR 93 | Wt 232.0 lb

## 2022-04-04 DIAGNOSIS — Z1151 Encounter for screening for human papillomavirus (HPV): Secondary | ICD-10-CM | POA: Diagnosis not present

## 2022-04-04 DIAGNOSIS — N898 Other specified noninflammatory disorders of vagina: Secondary | ICD-10-CM

## 2022-04-04 DIAGNOSIS — Z01419 Encounter for gynecological examination (general) (routine) without abnormal findings: Secondary | ICD-10-CM | POA: Insufficient documentation

## 2022-04-04 DIAGNOSIS — Z124 Encounter for screening for malignant neoplasm of cervix: Secondary | ICD-10-CM | POA: Diagnosis not present

## 2022-04-04 MED ORDER — FLUCONAZOLE 150 MG PO TABS: 150.0000 mg | ORAL_TABLET | Freq: Once | ORAL | 0 refills | Status: AC

## 2022-04-04 MED ORDER — MICONAZOLE NITRATE 2 % VA CREA: 1.0000 | TOPICAL_CREAM | Freq: Every day | VAGINAL | 0 refills | Status: DC

## 2022-04-04 NOTE — Assessment & Plan Note (Signed)
Suspect yeast infection, miconzaole cream and fluconazole x1 sent in STD testing per patient request Noted vaginal dryness, given list of lubricants to use Discussed in future could consider vaginal estrogen, can discuss with PCP if she is a candidate

## 2022-04-04 NOTE — Assessment & Plan Note (Signed)
Last pap in 2021 HPV negative with ASCUS, overdue for repeat pap in 1 year, performed today, will call with results

## 2022-04-04 NOTE — Patient Instructions (Addendum)
It was wonderful to see you today.  Please bring ALL of your medications with you to every visit.   Today we talked about:  For your urinary symptoms, I think you have a yeast infection. I gave you a one time pill, and a vaginal cream you can use once a day at night for week.  Here are some vaginal moisturizers/lubricants you can use instead of vaseline. RepHresh Vaginal gel Replens Long-Lasting Vaginal Moisturizer Luvena Canesintima Intimate Moisturizer Ah! Yes Vaginal Moisturizer Sylk Natural Intimate Moisturizer HyaloGyn Vaginal Hydrating Gel Revaree Vaginal Moisturizer K-Y Liquibeads Vaginal Moisturizer  We did your pap smear today and tested you for sexually transmitted infections.   Thank you for choosing [facility].   Please call [phone] with any questions about today's appointment.   Please arrive at least 15 minutes prior to your scheduled appointments.   If you had blood work today, I will send you a MyChart message or a letter if results are normal. Otherwise, I will give you a call.   If you had a referral placed, they will call you to set up an appointment. Please give [place] a call if you don't hear back in the next 2 weeks.   If you need additional refills before your next appointment, please call your pharmacy first.   Yehuda Savannah, MD  Family Medicine

## 2022-04-05 ENCOUNTER — Ambulatory Visit (HOSPITAL_BASED_OUTPATIENT_CLINIC_OR_DEPARTMENT_OTHER): Payer: Medicare Other | Admitting: Physical Therapy

## 2022-04-05 ENCOUNTER — Ambulatory Visit
Admission: RE | Admit: 2022-04-05 | Discharge: 2022-04-05 | Disposition: A | Discharge status: Home / Self Care | Payer: Medicare Other | Source: Ambulatory Visit | Attending: Family Medicine | Admitting: Family Medicine

## 2022-04-05 ENCOUNTER — Telehealth: Payer: Self-pay

## 2022-04-05 DIAGNOSIS — Z1231 Encounter for screening mammogram for malignant neoplasm of breast: Secondary | ICD-10-CM

## 2022-04-05 NOTE — Telephone Encounter (Signed)
A Prior Authorization was initiated for this patients LIDOCAINE 5% PATCHES through CoverMyMeds.   Key: [employer]

## 2022-04-06 LAB — CERVICOVAGINAL ANCILLARY ONLY
Bacterial Vaginitis (gardnerella): NEGATIVE
Candida Glabrata: NEGATIVE
Candida Vaginitis: POSITIVE — AB
Chlamydia: NEGATIVE
Comment: NEGATIVE
Comment: NEGATIVE
Comment: NEGATIVE
Comment: NEGATIVE
Comment: NEGATIVE
Comment: NORMAL
Neisseria Gonorrhea: NEGATIVE
Trichomonas: NEGATIVE

## 2022-04-06 LAB — CYTOLOGY - PAP
Comment: NEGATIVE
Diagnosis: NEGATIVE
High risk HPV: NEGATIVE

## 2022-04-09 ENCOUNTER — Encounter: Payer: Self-pay | Admitting: Family Medicine

## 2022-04-12 ENCOUNTER — Inpatient Hospital Stay: Admission: RE | Admit: 2022-04-12 | Payer: Medicare Other | Source: Ambulatory Visit

## 2022-04-13 ENCOUNTER — Encounter: Payer: Self-pay | Admitting: Family Medicine

## 2022-04-13 NOTE — Telephone Encounter (Signed)
Prior Auth for patients medication LIDOCAINE PATCHES denied by [employer] via CoverMyMeds.   Reason: Lidocaine Pad 5% is not FDA approved for your medical condition(s): Pain, joint, shoulder, Nontraumatic complete tear of right rotator cuff. These condition(s) are not supported by one of the accepted references. Therefore your drug is denied because it is not being used for a "medically accepted indication."  Lidocaine 4% patches are available OTC  CoverMyMeds Key: [id number]

## 2022-04-20 ENCOUNTER — Ambulatory Visit
Admission: RE | Admit: 2022-04-20 | Discharge: 2022-04-20 | Disposition: A | Discharge status: Home / Self Care | Payer: Medicare Other | Source: Ambulatory Visit | Attending: Family Medicine | Admitting: Family Medicine

## 2022-04-20 DIAGNOSIS — Z87891 Personal history of nicotine dependence: Secondary | ICD-10-CM | POA: Diagnosis not present

## 2022-04-20 DIAGNOSIS — J439 Emphysema, unspecified: Secondary | ICD-10-CM | POA: Diagnosis not present

## 2022-04-23 ENCOUNTER — Ambulatory Visit (HOSPITAL_BASED_OUTPATIENT_CLINIC_OR_DEPARTMENT_OTHER): Payer: Medicare Other | Admitting: Physical Therapy

## 2022-04-23 ENCOUNTER — Encounter: Payer: Self-pay | Admitting: Family Medicine

## 2022-04-23 NOTE — Progress Notes (Signed)
Surgery orders requested via Epic inbox. °

## 2022-04-24 NOTE — H&P (Signed)
Patient's anticipated LOS is less than 2 midnights, meeting these requirements: - Younger than 76 - Lives within 1 hour of care - Has a competent adult at home to recover with post-op recover - NO history of  - Chronic pain requiring opiods  - Diabetes  - Coronary Artery Disease  - Heart failure  - Heart attack  - Stroke  - DVT/VTE  - Cardiac arrhythmia  - Respiratory Failure/COPD  - Renal failure  - Anemia  - Advanced Liver disease     Alyssa Montgomery is an 61 y.o. female.    Chief Complaint: left shoulder pain  HPI: Pt is a 61 y.o. female complaining of left shoulder pain for multiple years. Pain had continually increased since the beginning. X-rays in the clinic show end-stage arthritic changes of the left shoulder. Pt has tried various conservative treatments which have failed to alleviate their symptoms, including injections and therapy. Various options are discussed with the patient. Risks, benefits and expectations were discussed with the patient. Patient understand the risks, benefits and expectations and wishes to proceed with surgery.   PCP:  Ezequiel Essex, MD  D/C Plans: Home  PMH: Past Medical History:  Diagnosis Date   Anxiety    Bilateral carpal tunnel syndrome 03/03/2018   Bronchitis    Cervical spondylosis without myelopathy 07/29/2018   Cervicalgia 11/16/2014   Cervicogenic headache 07/29/2018   Chronic bilateral low back pain with bilateral sciatica 09/18/2018   Claudication ([place]) 09/03/2016   Diverticulitis    FIBROIDS, UTERUS 03/09/2010   Qualifier: Diagnosis of  By: Carlean Purl MD, Tonna Boehringer E    Fibromyalgia    Fibromyalgia 06/18/2014   Gait abnormality 11/22/2020   GERD (gastroesophageal reflux disease)    Goiter 02/28/2013   Headache    Healthcare maintenance 04/17/2015   History of cervical dysplasia 01/18/2016   Hyperlipidemia    Hypertension associated with diabetes ([place]) 12/20/2019   Hypertension associated with diabetes ([place]) 12/20/2019    Left carpal tunnel syndrome 01/27/2018   Left foot pain 12/20/2019   Left hip pain 11/23/2019   Neck pain 11/22/2020   Neuropathy    Non-seasonal allergic rhinitis 07/13/2019   Pain in right ankle and joints of right foot 03/17/2019   Plantar fasciitis    Pre-operative clearance 08/12/2020   Primary osteoarthritis of left hip 09/12/2020   Right leg numbness 11/22/2020   Spinal stenosis of cervical region 07/29/2018   Substance abuse ([place])    10 years ago ( cocaine)   TIA (transient ischemic attack) 08/10/2016   Tobacco abuse 01/18/2012   Tobacco use disorder 04/17/2015   Trochanteric bursitis of left hip 11/23/2019   Urinary frequency 11/30/2019   UTI (urinary tract infection) 06/19/2012   UTI (urinary tract infection) 06/19/2012   Viral URI with cough 02/28/2013    PSH: Past Surgical History:  Procedure Laterality Date   ANKLE SURGERY     BACK SURGERY     Bowel obstruction     CARPAL TUNNEL RELEASE     CERVICAL CONIZATION W/BX N/A 12/27/2015   Procedure: CONIZATION CERVIX WITH BIOPSY;  Surgeon: Terrance Mass, MD;  Location: [facility];  Service: Gynecology;  Laterality: N/A;   DILATATION & CURETTAGE/HYSTEROSCOPY WITH MYOSURE N/A 12/27/2015   Procedure: DILATATION & CURETTAGE/HYSTEROSCOPY WITH MYOSURE;  Surgeon: Terrance Mass, MD;  Location: [facility];  Service: Gynecology;  Laterality: N/A;   ECTOPIC PREGNANCY SURGERY     EYE SURGERY     removed right eye;    EYE  SURGERY     TOTAL HIP ARTHROPLASTY Left 09/12/2020   Procedure: TOTAL HIP ARTHROPLASTY ANTERIOR APPROACH;  Surgeon: Gaynelle Arabian, MD;  Location: [facility];  Service: Orthopedics;  Laterality: Left;  131mn    Social History:  reports that she quit smoking about 2 years ago. Her smoking use included cigarettes. She has a 9.00 pack-year smoking history. She has never been exposed to tobacco smoke. She has never used smokeless tobacco. She reports current alcohol use. She reports that she does not use drugs. BMI: Estimated body mass  index is 39.82 kg/m as calculated from the following:   Height as of 03/15/22: '5\' 4"'$  (1.626 m).   Weight as of 04/04/22: 105.2 kg.  Lab Results  Component Value Date   ALBUMIN 3.2 (L) 12/05/2020   Diabetes:   Patient has a diagnosis of diabetes,  Lab Results  Component Value Date   HGBA1C 7.3 (A) 12/29/2021   Smoking Status:      Allergies:  Allergies  Allergen Reactions   Zanaflex [Tizanidine Hcl] Other (See Comments)    Syncope   Penicillins Other (See Comments)    Unknown childhood reaction. Has patient had a PCN reaction causing immediate rash, facial/tongue/throat swelling, SOB or lightheadedness with hypotension: Unknown Has patient had a PCN reaction causing severe rash involving mucus membranes or skin necrosis: Unknown Has patient had a PCN reaction that required hospitalization: Unknown Has patient had a PCN reaction occurring within the last 10 years: No  Tolerated Cephalosporin Date: 09/14/20.   Sulfonamide Derivatives Itching    Medications: No current facility-administered medications for this encounter.   Current Outpatient Medications  Medication Sig Dispense Refill   albuterol (VENTOLIN HFA) 108 (90 Base) MCG/ACT inhaler INHALE 2 PUFFS INTO THE LUNGS EVERY 6 HOURS AS NEEDED FOR WHEEZING/SHORTNESS OF BREATH 17 each 2   alum & mag hydroxide-simeth (MAALOX MAX) 400-400-40 MG/5ML suspension Take 10 mLs by mouth every 6 (six) hours as needed for indigestion. (Patient not taking: Reported on 03/15/2022) 355 mL 0   benzonatate (TESSALON) 100 MG capsule Take 1 capsule (100 mg total) by mouth 3 (three) times daily as needed for cough. (Patient not taking: Reported on 03/15/2022) 21 capsule 0   bismuth subsalicylate (PEPTO BISMOL) 262 MG/15ML suspension Take 30 mLs by mouth every 6 (six) hours as needed for indigestion or diarrhea or loose stools. (Patient not taking: Reported on 03/15/2022)     dextromethorphan 15 MG/5ML syrup Take 10 mLs (30 mg total) by mouth 4  (four) times daily as needed for cough. (Patient not taking: Reported on 06/29/2021) 120 mL 0   ECHINACEA PO Take 1 capsule by mouth daily.     erythromycin ophthalmic ointment Place 1 application. into the left eye at bedtime. (Patient not taking: Reported on 03/15/2022)     fluticasone (FLONASE) 50 MCG/ACT nasal spray Place 1 spray into both nostrils daily. 15.8 mL 2   guaiFENesin (MUCINEX) 600 MG 12 hr tablet Take 1 tablet (600 mg total) by mouth 2 (two) times daily as needed for to loosen phlegm or cough. 30 tablet 2   ipratropium (ATROVENT) 0.06 % nasal spray Place 2 sprays into both nostrils 4 (four) times daily. (Patient taking differently: Place 2 sprays into both nostrils at bedtime.) 15 mL 12   lidocaine (LIDODERM) 5 % Place 1 patch onto the skin daily. Remove & Discard patch within 12 hours or as directed by MD 30 patch 3   metFORMIN (GLUCOPHAGE-XR) 500 MG 24 hr tablet Take 2 tablets (  1,000 mg total) by mouth every evening. 180 tablet 3   miconazole (MICONAZOLE 7) 2 % vaginal cream Place 1 Applicatorful vaginally at bedtime. 45 g 0   naproxen (NAPROSYN) 500 MG tablet TAKE 1 TABLET BY MOUTH TWICE A DAY WITH FOOD 30 tablet 0   Omega 3 1000 MG CAPS Take 1,000 mg by mouth daily. (Patient not taking: Reported on 03/15/2022)     omeprazole (PRILOSEC) 20 MG capsule Take 1 capsule (20 mg total) by mouth daily. 30 capsule 0   ondansetron (ZOFRAN) 4 MG tablet Take 1 tablet (4 mg total) by mouth every 8 (eight) hours as needed for nausea or vomiting. 4 tablet 0   rosuvastatin (CRESTOR) 5 MG tablet Take 1 tablet (5 mg total) by mouth daily. 90 tablet 3   Simethicone (GAS-X PO) Take 2 tablets by mouth every 6 (six) hours as needed (stomach pain).     traMADol (ULTRAM) 50 MG tablet Take 1 tablet (50 mg total) by mouth every 6 (six) hours as needed. For shoulder pain not responsive to tylenol. 40 tablet 0   zinc gluconate 50 MG tablet Take 50 mg by mouth daily.      No results found for this or any  previous visit (from the past 48 hour(s)). No results found.  ROS: Pain with rom of the left upper extremity  Physical Exam: Alert and oriented 61 y.o. female in no acute distress Cranial nerves 2-12 intact Cervical spine: full rom with no tenderness, nv intact distally Chest: active breath sounds bilaterally, no wheeze rhonchi or rales Heart: regular rate and rhythm, no murmur Abd: non tender non distended with active bowel sounds Hip is stable with rom  Left shoulder painful and weak rom Nv intact distally No rashes or edema distally  Assessment/Plan Assessment: left shoulder cuff arthropathy  Plan:  Patient will undergo a left reverse total shoulder by Dr. Veverly Fells at [facility] Risks benefits and expectations were discussed with the patient. Patient understand risks, benefits and expectations and wishes to proceed. Preoperative templating of the joint replacement has been completed, documented, and submitted to the Operating Room personnel in order to optimize intra-operative equipment management.   Merla Riches PA-C, [facility] is now [employer] [address], [place], [place], [place] [address] Phone: [phone] [email or web address] Facebook  [employer]

## 2022-04-25 DIAGNOSIS — H2512 Age-related nuclear cataract, left eye: Secondary | ICD-10-CM | POA: Diagnosis not present

## 2022-04-25 DIAGNOSIS — H0102B Squamous blepharitis left eye, upper and lower eyelids: Secondary | ICD-10-CM | POA: Diagnosis not present

## 2022-04-25 DIAGNOSIS — H40012 Open angle with borderline findings, low risk, left eye: Secondary | ICD-10-CM | POA: Diagnosis not present

## 2022-04-25 DIAGNOSIS — H16212 Exposure keratoconjunctivitis, left eye: Secondary | ICD-10-CM | POA: Diagnosis not present

## 2022-04-25 DIAGNOSIS — H04122 Dry eye syndrome of left lacrimal gland: Secondary | ICD-10-CM | POA: Diagnosis not present

## 2022-04-27 ENCOUNTER — Telehealth: Payer: Self-pay

## 2022-04-27 NOTE — Patient Outreach (Signed)
  Care Coordination   Initial Visit Note   04/27/2022 Name: Alyssa Montgomery MRN: [id number] DOB: 1961/01/18  Alyssa Montgomery is a 61 y.o. year old female who sees Ezequiel Essex, MD for primary care. I spoke with  Renette Butters by phone today.  What matters to the patients health and wellness today? "I am having a total shoulder replacement and I am going to be out of work for 3+ months.  My husband is disabled and I was the only income, besides disabilty".    Goals Addressed               This Visit's Progress     "I am having surgery and I need help" (pt-stated)        Care Coordination Interventions: Social Work referral for patient concerns of being out of work for 3+ months and her finances. Assessed social determinant of health barriers          SDOH assessments and interventions completed:  Yes  SDOH Interventions Today    Flowsheet Row Most Recent Value  SDOH Interventions   Food Insecurity Interventions Intervention Not Indicated  Housing Interventions Intervention Not Indicated  Utilities Interventions Intervention Not Indicated        Care Coordination Interventions Activated:  Yes  Care Coordination Interventions:  Yes, provided   Follow up plan: Referral made to MSW    Encounter Outcome:  Pt. Scheduled

## 2022-04-30 ENCOUNTER — Encounter (HOSPITAL_COMMUNITY)
Admission: RE | Admit: 2022-04-30 | Discharge: 2022-04-30 | Disposition: A | Discharge status: Home / Self Care | Payer: Medicare Other | Source: Ambulatory Visit | Attending: Orthopedic Surgery | Admitting: Orthopedic Surgery

## 2022-04-30 ENCOUNTER — Encounter (HOSPITAL_COMMUNITY): Payer: Self-pay

## 2022-04-30 ENCOUNTER — Ambulatory Visit: Payer: Self-pay | Admitting: Licensed Clinical Social Worker

## 2022-04-30 VITALS — BP 150/80 | HR 65 | Temp 98.4°F | Resp 16 | Ht 64.0 in | Wt 229.0 lb

## 2022-04-30 DIAGNOSIS — Z01818 Encounter for other preprocedural examination: Secondary | ICD-10-CM

## 2022-04-30 DIAGNOSIS — E119 Type 2 diabetes mellitus without complications: Secondary | ICD-10-CM

## 2022-04-30 LAB — BASIC METABOLIC PANEL
Anion gap: 10 (ref 5–15)
BUN: 19 mg/dL (ref 8–23)
CO2: 26 mmol/L (ref 22–32)
Calcium: 8.6 mg/dL — ABNORMAL LOW (ref 8.9–10.3)
Chloride: 105 mmol/L (ref 98–111)
Creatinine, Ser: 0.84 mg/dL (ref 0.44–1.00)
GFR, Estimated: >60 mL/min (ref >60)
Glucose, Bld: 105 mg/dL — ABNORMAL HIGH (ref 70–99)
Potassium: 3.6 mmol/L (ref 3.5–5.1)
Sodium: 141 mmol/L (ref 135–145)

## 2022-04-30 LAB — CBC
HCT: 39.7 % (ref 36.0–46.0)
Hemoglobin: 11.9 g/dL — ABNORMAL LOW (ref 12.0–15.0)
MCH: 25.5 pg — ABNORMAL LOW (ref 26.0–34.0)
MCHC: 30 g/dL (ref 30.0–36.0)
MCV: 85 fL (ref 80.0–100.0)
Platelets: 258 10*3/uL (ref 150–400)
RBC: 4.67 MIL/uL (ref 3.87–5.11)
RDW: 14.6 % (ref 11.5–15.5)
WBC: 5.3 10*3/uL (ref 4.0–10.5)
nRBC: 0 % (ref 0.0–0.2)

## 2022-04-30 LAB — SURGICAL PCR SCREEN
MRSA, PCR: NEGATIVE
Staphylococcus aureus: NEGATIVE

## 2022-04-30 LAB — GLUCOSE, CAPILLARY: Glucose-Capillary: 121 mg/dL — ABNORMAL HIGH (ref 70–99)

## 2022-04-30 NOTE — Progress Notes (Addendum)
COVID Vaccine Completed: yes  Date of COVID positive in last 90 days: yes 02/26/22  PCP - Ezequiel Essex, MD Cardiologist - n/a  Chest x-ray - CT 04/20/22 Epic EKG - 02/27/22 Epic Stress Test - n/a ECHO - 2018 Cardiac Cath - n/a Pacemaker/ICD device last checked: n/a Spinal Cord Stimulator: n/an/a  Bowel Prep - no  Sleep Study - n/a CPAP -   Fasting Blood Sugar - no checks at home Checks Blood Sugar  times a day  Last dose of GLP1 agonist-  N/A GLP1 instructions:  N/A   Last dose of SGLT-2 inhibitors-  N/A SGLT-2 instructions: N/A   Blood Thinner Instructions: Aspirin Instructions: Last Dose:  Activity level: Can go up a flight of stairs and perform activities of daily living without stopping and without symptoms of chest pain or shortness of breath.  Anesthesia review:   Patient denies shortness of breath, fever, cough and chest pain at PAT appointment  Patient verbalized understanding of instructions that were given to them at the PAT appointment. Patient was also instructed that they will need to review over the PAT instructions again at home before surgery.

## 2022-04-30 NOTE — Patient Outreach (Signed)
  Care Coordination   04/30/2022 Name: Alyssa Montgomery MRN: [id number] DOB: [birth year]   Care Coordination Outreach Attempts:  An unsuccessful telephone outreach was attempted today to offer the patient information about available care coordination services as a benefit of their health plan.   Follow Up Plan:  Additional outreach attempts will be made to offer the patient care coordination information and services.   Encounter Outcome:  No Answer  Care Coordination Interventions Activated:  No   Care Coordination Interventions:  No, not indicated    Milus Height, BSW , MSW, [place] Social Worker [facility] Care Management  [phone]

## 2022-04-30 NOTE — Patient Instructions (Addendum)
SURGICAL WAITING ROOM VISITATION Patients having surgery or a procedure may have no more than 2 support people in the waiting area - these visitors may rotate.   Children under the age of 11 must have an adult with them who is not the patient. If the patient needs to stay at the hospital during part of their recovery, the visitor guidelines for inpatient rooms apply. Pre-op nurse will coordinate an appropriate time for 1 support person to accompany patient in pre-op.  This support person may not rotate.    Please refer to the [facility] website for the visitor guidelines for Inpatients (after your surgery is over and you are in a regular room).    Your procedure is scheduled on: 05/11/22   Report to [facility] Main Entrance    Report to admitting at 10:10 AM   Call this number if you have problems the morning of surgery [phone]   Do not eat food :After Midnight.   After Midnight you may have the following liquids until 9:40 AM DAY OF SURGERY  Water Non-Citrus Juices (without pulp, NO RED) Carbonated Beverages Black Coffee (NO MILK/CREAM OR CREAMERS, sugar ok)  Clear Tea (NO MILK/CREAM OR CREAMERS, sugar ok) regular and decaf                             Plain Jell-O (NO RED)                                           Fruit ices (not with fruit pulp, NO RED)                                     Popsicles (NO RED)                                                               Sports drinks like Gatorade (NO RED)                 The day of surgery:  Drink ONE (1) Pre-Surgery G2 at 9:40 AM the morning of surgery. Drink in one sitting. Do not sip.  This drink was given to you during your hospital  pre-op appointment visit. Nothing else to drink after completing the  Pre-Surgery G2.          If you have questions, please contact your surgeon's office.   FOLLOW BOWEL PREP AND ANY ADDITIONAL PRE OP INSTRUCTIONS YOU RECEIVED FROM YOUR SURGEON'S OFFICE!!!     Oral Hygiene  is also important to reduce your risk of infection.                                    Remember - BRUSH YOUR TEETH THE MORNING OF SURGERY WITH YOUR REGULAR TOOTHPASTE   Take these medicines the morning of surgery with A SIP OF WATER: Inhalers, Prevacid  DO NOT TAKE ANY ORAL DIABETIC MEDICATIONS DAY OF YOUR SURGERY  How to Manage Your Diabetes Before and  After Surgery  Why is it important to control my blood sugar before and after surgery? Improving blood sugar levels before and after surgery helps healing and can limit problems. A way of improving blood sugar control is eating a healthy diet by:  Eating less sugar and carbohydrates  Increasing activity/exercise  Talking with your doctor about reaching your blood sugar goals High blood sugars (greater than 180 mg/dL) can raise your risk of infections and slow your recovery, so you will need to focus on controlling your diabetes during the weeks before surgery. Make sure that the doctor who takes care of your diabetes knows about your planned surgery including the date and location.  How do I manage my blood sugar before surgery? Check your blood sugar at least 4 times a day, starting 2 days before surgery, to make sure that the level is not too high or low. Check your blood sugar the morning of your surgery when you wake up and every 2 hours until you get to the Short Stay unit. If your blood sugar is less than 70 mg/dL, you will need to treat for low blood sugar: Do not take insulin. Treat a low blood sugar (less than 70 mg/dL) with  cup of clear juice (cranberry or apple), 4 glucose tablets, OR glucose gel. Recheck blood sugar in 15 minutes after treatment (to make sure it is greater than 70 mg/dL). If your blood sugar is not greater than 70 mg/dL on recheck, call [phone] for further instructions. Report your blood sugar to the short stay nurse when you get to Short Stay.  If you are admitted to the hospital after surgery: Your  blood sugar will be checked by the staff and you will probably be given insulin after surgery (instead of oral diabetes medicines) to make sure you have good blood sugar levels. The goal for blood sugar control after surgery is 80-180 mg/dL.   WHAT DO I DO ABOUT MY DIABETES MEDICATION?  Do not take oral diabetes medicines (pills) the morning of surgery.  THE DAY BEFORE SURGERY, take Metformin as prescribed.       THE MORNING OF SURGERY, do not take Metformin   Reviewed and Endorsed by [facility] Patient Education Committee, August 2015                              You may not have any metal on your body including hair pins, jewelry, and body piercing             Do not wear make-up, lotions, powders, perfumes, or deodorant  Do not wear nail polish including gel and S&S, artificial/acrylic nails, or any other type of covering on natural nails including finger and toenails. If you have artificial nails, gel coating, etc. that needs to be removed by a nail salon please have this removed prior to surgery or surgery may need to be canceled/ delayed if the surgeon/ anesthesia feels like they are unable to be safely monitored.   Do not shave  48 hours prior to surgery.    Do not bring valuables to the hospital. [place].   Contacts, dentures or bridgework may not be worn into surgery.   Bring small overnight bag day of surgery.   DO NOT [place]. PHARMACY WILL DISPENSE MEDICATIONS LISTED  ON YOUR MEDICATION LIST TO YOU DURING YOUR ADMISSION [place]!   Special Instructions: Bring a copy of your healthcare power of attorney and living will documents the day of surgery if you haven't scanned them before.              Please read over the following fact sheets you were given: IF [place] [phone] Schneiders   If you received a COVID test during  your pre-op visit  it is requested that you wear a mask when out in public, stay away from anyone that may not be feeling well and notify your surgeon if you develop symptoms. If you test positive for Covid or have been in contact with anyone that has tested positive in the last 10 days please notify you surgeon.    [place] - Preparing for Surgery Before surgery, you can play an important role.  Because skin is not sterile, your skin needs to be as free of germs as possible.  You can reduce the number of germs on your skin by washing with CHG (chlorahexidine gluconate) soap before surgery.  CHG is an antiseptic cleaner which kills germs and bonds with the skin to continue killing germs even after washing. Please DO NOT use if you have an allergy to CHG or antibacterial soaps.  If your skin becomes reddened/irritated stop using the CHG and inform your nurse when you arrive at Short Stay. Do not shave (including legs and underarms) for at least 48 hours prior to the first CHG shower.  You may shave your face/neck.  Please follow these instructions carefully:  1.  Shower with CHG Soap the night before surgery and the  morning of surgery.  2.  If you choose to wash your hair, wash your hair first as usual with your normal  shampoo.  3.  After you shampoo, rinse your hair and body thoroughly to remove the shampoo.                             4.  Use CHG as you would any other liquid soap.  You can apply chg directly to the skin and wash.  Gently with a scrungie or clean washcloth.  5.  Apply the CHG Soap to your body ONLY FROM THE NECK DOWN.   Do   not use on face/ open                           Wound or open sores. Avoid contact with eyes, ears mouth and   genitals (private parts).                       Wash face,  Genitals (private parts) with your normal soap.             6.  Wash thoroughly, paying special attention to the area where your    surgery  will be performed.  7.  Thoroughly rinse your  body with warm water from the neck down.  8.  DO NOT shower/wash with your normal soap after using and rinsing off the CHG Soap.                9.  Pat yourself dry with a clean towel.            10.  Wear clean pajamas.  11.  Place clean sheets on your bed the night of your first shower and do not  sleep with pets. Day of Surgery : Do not apply any lotions/deodorants the morning of surgery.  Please wear clean clothes to the hospital/surgery center.  FAILURE TO FOLLOW THESE INSTRUCTIONS MAY RESULT IN THE CANCELLATION OF YOUR SURGERY  PATIENT SIGNATURE_________________________________  NURSE SIGNATURE__________________________________  ________________________________________________________________________  Adam Phenix  An incentive spirometer is a tool that can help keep your lungs clear and active. This tool measures how well you are filling your lungs with each breath. Taking long deep breaths may help reverse or decrease the chance of developing breathing (pulmonary) problems (especially infection) following: A long period of time when you are unable to move or be active. BEFORE THE PROCEDURE  If the spirometer includes an indicator to show your best effort, your nurse or respiratory therapist will set it to a desired goal. If possible, sit up straight or lean slightly forward. Try not to slouch. Hold the incentive spirometer in an upright position. INSTRUCTIONS FOR USE  Sit on the edge of your bed if possible, or sit up as far as you can in bed or on a chair. Hold the incentive spirometer in an upright position. Breathe out normally. Place the mouthpiece in your mouth and seal your lips tightly around it. Breathe in slowly and as deeply as possible, raising the piston or the ball toward the top of the column. Hold your breath for 3-5 seconds or for as long as possible. Allow the piston or ball to fall to the bottom of the column. Remove the mouthpiece from your  mouth and breathe out normally. Rest for a few seconds and repeat Steps 1 through 7 at least 10 times every 1-2 hours when you are awake. Take your time and take a few normal breaths between deep breaths. The spirometer may include an indicator to show your best effort. Use the indicator as a goal to work toward during each repetition. After each set of 10 deep breaths, practice coughing to be sure your lungs are clear. If you have an incision (the cut made at the time of surgery), support your incision when coughing by placing a pillow or rolled up towels firmly against it. Once you are able to get out of bed, walk around indoors and cough well. You may stop using the incentive spirometer when instructed by your caregiver.  RISKS AND COMPLICATIONS Take your time so you do not get dizzy or light-headed. If you are in pain, you may need to take or ask for pain medication before doing incentive spirometry. It is harder to take a deep breath if you are having pain. AFTER USE Rest and breathe slowly and easily. It can be helpful to keep track of a log of your progress. Your caregiver can provide you with a simple table to help with this. If you are using the spirometer at home, follow these instructions: [place] IF:  You are having difficultly using the spirometer. You have trouble using the spirometer as often as instructed. Your pain medication is not giving enough relief while using the spirometer. You develop fever of 100.5 F (38.1 C) or higher. SEEK IMMEDIATE MEDICAL CARE IF:  You cough up bloody sputum that had not been present before. You develop fever of 102 F (38.9 C) or greater. You develop worsening pain at or near the incision site. MAKE SURE YOU:  Understand these instructions. Will watch your condition. Will get  help right away if you are not doing well or get worse. Document Released: 10/08/2006 Document Revised: 08/20/2011 Document Reviewed: 12/09/2006 ExitCare  Patient Information 2014 [employer].   ________________________________________________________________________ [facility]- Preparing for Total Shoulder Arthroplasty    Before surgery, you can play an important role. Because skin is not sterile, your skin needs to be as free of germs as possible. You can reduce the number of germs on your skin by using the following products. Benzoyl Peroxide Gel Reduces the number of germs present on the skin Applied twice a day to shoulder area starting two days before surgery    ==================================================================  Please follow these instructions carefully:  BENZOYL PEROXIDE 5% GEL  Please do not use if you have an allergy to benzoyl peroxide.   If your skin becomes reddened/irritated stop using the benzoyl peroxide.  Starting two days before surgery, apply as follows: Apply benzoyl peroxide in the morning and at night. Apply after taking a shower. If you are not taking a shower clean entire shoulder front, back, and side along with the armpit with a clean wet washcloth.  Place a quarter-sized dollop on your shoulder and rub in thoroughly, making sure to cover the front, back, and side of your shoulder, along with the armpit.   2 days before ____ AM   ____ PM              1 day before ____ AM   ____ PM                         Do this twice a day for two days.  (Last application is the night before surgery, AFTER using the CHG soap as described below).  Do NOT apply benzoyl peroxide gel on the day of surgery.

## 2022-05-01 LAB — HEMOGLOBIN A1C
Hgb A1c MFr Bld: 6.9 % — ABNORMAL HIGH (ref 4.8–5.6)
Mean Plasma Glucose: 151 mg/dL

## 2022-05-10 NOTE — Anesthesia Preprocedure Evaluation (Addendum)
Anesthesia Evaluation  Patient identified by MRN, date of birth, ID band Patient awake    Reviewed: Allergy & Precautions, NPO status , Patient's Chart, lab work & pertinent test results  Airway Mallampati: III  TM Distance: >3 FB Neck ROM: Full    Dental no notable dental hx. (+) Partial Upper, Dental Advisory Given, Missing,    Pulmonary former smoker COVID 02/2022 Quit smoking 2020   Pulmonary exam normal breath sounds clear to auscultation       Cardiovascular hypertension (no meds), Normal cardiovascular exam Rhythm:Regular Rate:Normal     Neuro/Psych  Headaches PSYCHIATRIC DISORDERS Anxiety        GI/Hepatic ,GERD  Medicated and Controlled,,(+)     substance abuse (remote hx)  cocaine useLast use 20 years ago   Endo/Other  diabetes, Type 2, Oral Hypoglycemic Agents    Renal/GU negative Renal ROS  negative genitourinary   Musculoskeletal  (+) Arthritis , Osteoarthritis,  Fibromyalgia -  Abdominal  (+) + obese (BMI 39.31)  Peds  Hematology  (+) Blood dyscrasia, anemia Hb 11.9, plt 258   Anesthesia Other Findings   Reproductive/Obstetrics negative OB ROS                             Anesthesia Physical Anesthesia Plan  ASA: 3  Anesthesia Plan: General and Regional   Post-op Pain Management: Tylenol PO (pre-op)* and Regional block*   Induction: Intravenous  PONV Risk Score and Plan: 3 and Ondansetron, Dexamethasone, Midazolam and Treatment may vary due to age or medical condition  Airway Management Planned: Oral ETT and Video Laryngoscope Planned  Additional Equipment: None  Intra-op Plan:   Post-operative Plan: Extubation in OR  Informed Consent: I have reviewed the patients History and Physical, chart, labs and discussed the procedure including the risks, benefits and alternatives for the proposed anesthesia with the patient or authorized representative who has indicated  his/her understanding and acceptance.     Dental advisory given  Plan Discussed with: CRNA, Anesthesiologist and Surgeon  Anesthesia Plan Comments:         Anesthesia Quick Evaluation

## 2022-05-11 ENCOUNTER — Ambulatory Visit (HOSPITAL_BASED_OUTPATIENT_CLINIC_OR_DEPARTMENT_OTHER): Payer: Medicare Other | Admitting: Anesthesiology

## 2022-05-11 ENCOUNTER — Other Ambulatory Visit: Payer: Self-pay

## 2022-05-11 ENCOUNTER — Observation Stay (HOSPITAL_COMMUNITY)
Admission: RE | Admit: 2022-05-11 | Discharge: 2022-05-12 | Disposition: A | Discharge status: Home Health | Payer: Medicare Other | Source: Ambulatory Visit | Attending: Orthopedic Surgery | Admitting: Orthopedic Surgery

## 2022-05-11 ENCOUNTER — Ambulatory Visit (HOSPITAL_COMMUNITY): Payer: Medicare Other | Admitting: Anesthesiology

## 2022-05-11 ENCOUNTER — Encounter (HOSPITAL_COMMUNITY): Payer: Self-pay | Admitting: Orthopedic Surgery

## 2022-05-11 ENCOUNTER — Encounter (HOSPITAL_COMMUNITY)
Admission: RE | Disposition: A | Discharge status: Home Health | Payer: Self-pay | Source: Ambulatory Visit | Attending: Orthopedic Surgery

## 2022-05-11 ENCOUNTER — Observation Stay (HOSPITAL_COMMUNITY): Payer: Medicare Other

## 2022-05-11 DIAGNOSIS — E119 Type 2 diabetes mellitus without complications: Secondary | ICD-10-CM

## 2022-05-11 DIAGNOSIS — I10 Essential (primary) hypertension: Secondary | ICD-10-CM | POA: Insufficient documentation

## 2022-05-11 DIAGNOSIS — Z96642 Presence of left artificial hip joint: Secondary | ICD-10-CM | POA: Diagnosis not present

## 2022-05-11 DIAGNOSIS — Z8673 Personal history of transient ischemic attack (TIA), and cerebral infarction without residual deficits: Secondary | ICD-10-CM | POA: Diagnosis not present

## 2022-05-11 DIAGNOSIS — M12812 Other specific arthropathies, not elsewhere classified, left shoulder: Secondary | ICD-10-CM

## 2022-05-11 DIAGNOSIS — Z79899 Other long term (current) drug therapy: Secondary | ICD-10-CM | POA: Diagnosis not present

## 2022-05-11 DIAGNOSIS — Z87891 Personal history of nicotine dependence: Secondary | ICD-10-CM | POA: Insufficient documentation

## 2022-05-11 DIAGNOSIS — Z96612 Presence of left artificial shoulder joint: Secondary | ICD-10-CM

## 2022-05-11 DIAGNOSIS — M19012 Primary osteoarthritis, left shoulder: Secondary | ICD-10-CM | POA: Diagnosis not present

## 2022-05-11 DIAGNOSIS — M75102 Unspecified rotator cuff tear or rupture of left shoulder, not specified as traumatic: Secondary | ICD-10-CM | POA: Diagnosis not present

## 2022-05-11 DIAGNOSIS — G8918 Other acute postprocedural pain: Secondary | ICD-10-CM | POA: Diagnosis not present

## 2022-05-11 DIAGNOSIS — Z471 Aftercare following joint replacement surgery: Secondary | ICD-10-CM | POA: Diagnosis not present

## 2022-05-11 HISTORY — PX: REVERSE SHOULDER ARTHROPLASTY: SHX5054

## 2022-05-11 LAB — GLUCOSE, CAPILLARY
Glucose-Capillary: 133 mg/dL — ABNORMAL HIGH (ref 70–99)
Glucose-Capillary: 177 mg/dL — ABNORMAL HIGH (ref 70–99)
Glucose-Capillary: 211 mg/dL — ABNORMAL HIGH (ref 70–99)

## 2022-05-11 SURGERY — ARTHROPLASTY, SHOULDER, TOTAL, REVERSE
Anesthesia: Regional | Site: Shoulder | Laterality: Left

## 2022-05-11 MED ORDER — OXYCODONE-ACETAMINOPHEN 5-325 MG PO TABS: 1.0000 | ORAL_TABLET | ORAL | 0 refills | Status: DC | PRN

## 2022-05-11 MED ORDER — DOCUSATE SODIUM 100 MG PO CAPS
100.0000 mg | ORAL_CAPSULE | Freq: Two times a day (BID) | ORAL | Status: DC
  Filled 2022-05-11 (×2): qty 1

## 2022-05-11 MED ORDER — ONDANSETRON HCL 4 MG/2ML IJ SOLN: 4.0000 mg | Freq: Once | INTRAMUSCULAR | Status: DC | PRN

## 2022-05-11 MED ORDER — OXYCODONE HCL 5 MG PO TABS
5.0000 mg | ORAL_TABLET | ORAL | Status: DC | PRN
  Administered 2022-05-12 (×2): 10 mg via ORAL
  Filled 2022-05-11 (×3): qty 2

## 2022-05-11 MED ORDER — ALUM & MAG HYDROXIDE-SIMETH 200-200-20 MG/5ML PO SUSP: 30.0000 mL | Freq: Four times a day (QID) | ORAL | Status: DC | PRN

## 2022-05-11 MED ORDER — METOCLOPRAMIDE HCL 5 MG/ML IJ SOLN
5.0000 mg | Freq: Three times a day (TID) | INTRAMUSCULAR | Status: DC | PRN
  Administered 2022-05-11: 10 mg via INTRAVENOUS
  Filled 2022-05-11: qty 2

## 2022-05-11 MED ORDER — FLUTICASONE PROPIONATE 50 MCG/ACT NA SUSP
1.0000 | Freq: Every day | NASAL | Status: DC
  Filled 2022-05-11: qty 16

## 2022-05-11 MED ORDER — LIDOCAINE 2% (20 MG/ML) 5 ML SYRINGE
INTRAMUSCULAR | Status: DC | PRN
  Administered 2022-05-11: 60 mg via INTRAVENOUS

## 2022-05-11 MED ORDER — INSULIN ASPART 100 UNIT/ML IJ SOLN: 6.0000 [IU] | Freq: Three times a day (TID) | INTRAMUSCULAR | Status: DC

## 2022-05-11 MED ORDER — AMISULPRIDE (ANTIEMETIC) 5 MG/2ML IV SOLN: 10.0000 mg | Freq: Once | INTRAVENOUS | Status: DC | PRN

## 2022-05-11 MED ORDER — POLYETHYLENE GLYCOL 3350 17 G PO PACK: 17.0000 g | PACK | Freq: Every day | ORAL | Status: DC | PRN

## 2022-05-11 MED ORDER — ALUM & MAG HYDROXIDE-SIMETH 400-400-40 MG/5ML PO SUSP: 10.0000 mL | Freq: Four times a day (QID) | ORAL | Status: DC | PRN

## 2022-05-11 MED ORDER — ONDANSETRON HCL 4 MG PO TABS: 4.0000 mg | ORAL_TABLET | Freq: Four times a day (QID) | ORAL | Status: DC | PRN

## 2022-05-11 MED ORDER — METHOCARBAMOL 500 MG IVPB - SIMPLE MED
500.0000 mg | Freq: Four times a day (QID) | INTRAVENOUS | Status: DC | PRN
  Administered 2022-05-11: 500 mg via INTRAVENOUS

## 2022-05-11 MED ORDER — ROSUVASTATIN CALCIUM 5 MG PO TABS
5.0000 mg | ORAL_TABLET | Freq: Every day | ORAL | Status: DC
  Administered 2022-05-11 – 2022-05-12 (×2): 5 mg via ORAL
  Filled 2022-05-11 (×2): qty 1

## 2022-05-11 MED ORDER — OXYCODONE HCL 5 MG/5ML PO SOLN: 5.0000 mg | Freq: Once | ORAL | Status: DC | PRN

## 2022-05-11 MED ORDER — CYCLOBENZAPRINE HCL 10 MG PO TABS: 10.0000 mg | ORAL_TABLET | Freq: Two times a day (BID) | ORAL | Status: DC | PRN

## 2022-05-11 MED ORDER — PHENYLEPHRINE HCL (PRESSORS) 10 MG/ML IV SOLN
INTRAVENOUS | Status: AC
  Filled 2022-05-11: qty 1

## 2022-05-11 MED ORDER — METHOCARBAMOL 500 MG PO TABS: 500.0000 mg | ORAL_TABLET | Freq: Three times a day (TID) | ORAL | 1 refills | Status: DC | PRN

## 2022-05-11 MED ORDER — HYDROMORPHONE HCL 1 MG/ML IJ SOLN
0.5000 mg | INTRAMUSCULAR | Status: DC | PRN
  Administered 2022-05-12: 1 mg via INTRAVENOUS
  Filled 2022-05-11: qty 1

## 2022-05-11 MED ORDER — ALBUTEROL SULFATE HFA 108 (90 BASE) MCG/ACT IN AERS
INHALATION_SPRAY | RESPIRATORY_TRACT | Status: AC
  Filled 2022-05-11: qty 6.7

## 2022-05-11 MED ORDER — METHOCARBAMOL 500 MG IVPB - SIMPLE MED
INTRAVENOUS | Status: AC
  Filled 2022-05-11: qty 55

## 2022-05-11 MED ORDER — METOCLOPRAMIDE HCL 5 MG PO TABS: 5.0000 mg | ORAL_TABLET | Freq: Three times a day (TID) | ORAL | Status: DC | PRN

## 2022-05-11 MED ORDER — PROPOFOL 10 MG/ML IV BOLUS
INTRAVENOUS | Status: DC | PRN
  Administered 2022-05-11: 150 mg via INTRAVENOUS

## 2022-05-11 MED ORDER — DEXAMETHASONE SODIUM PHOSPHATE 10 MG/ML IJ SOLN
INTRAMUSCULAR | Status: DC | PRN
  Administered 2022-05-11: 4 mg via INTRAVENOUS

## 2022-05-11 MED ORDER — FENTANYL CITRATE PF 50 MCG/ML IJ SOSY
100.0000 ug | PREFILLED_SYRINGE | INTRAMUSCULAR | Status: DC
  Administered 2022-05-11: 50 ug via INTRAVENOUS
  Filled 2022-05-11: qty 2

## 2022-05-11 MED ORDER — DEXTROMETHORPHAN POLISTIREX ER 30 MG/5ML PO SUER: 30.0000 mg | Freq: Two times a day (BID) | ORAL | Status: DC | PRN

## 2022-05-11 MED ORDER — BUPIVACAINE-EPINEPHRINE (PF) 0.25% -1:200000 IJ SOLN
INTRAMUSCULAR | Status: DC | PRN
  Administered 2022-05-11: 20 mL

## 2022-05-11 MED ORDER — ACETAMINOPHEN 325 MG PO TABS
325.0000 mg | ORAL_TABLET | Freq: Four times a day (QID) | ORAL | Status: DC | PRN
  Administered 2022-05-12: 650 mg via ORAL
  Filled 2022-05-11 (×2): qty 2

## 2022-05-11 MED ORDER — HYDROMORPHONE HCL 1 MG/ML IJ SOLN
INTRAMUSCULAR | Status: AC
  Filled 2022-05-11: qty 1

## 2022-05-11 MED ORDER — SODIUM CHLORIDE 0.9 % IV SOLN: INTRAVENOUS | Status: DC

## 2022-05-11 MED ORDER — LACTATED RINGERS IV SOLN: INTRAVENOUS | Status: DC

## 2022-05-11 MED ORDER — ZINC GLUCONATE 50 MG PO TABS: 50.0000 mg | ORAL_TABLET | Freq: Every day | ORAL | Status: DC

## 2022-05-11 MED ORDER — ONDANSETRON HCL 4 MG/2ML IJ SOLN
INTRAMUSCULAR | Status: AC
  Filled 2022-05-11: qty 2

## 2022-05-11 MED ORDER — ALBUTEROL SULFATE (2.5 MG/3ML) 0.083% IN NEBU: 2.5000 mg | INHALATION_SOLUTION | Freq: Four times a day (QID) | RESPIRATORY_TRACT | Status: DC | PRN

## 2022-05-11 MED ORDER — ONDANSETRON HCL 4 MG PO TABS: 4.0000 mg | ORAL_TABLET | Freq: Three times a day (TID) | ORAL | 1 refills | Status: DC | PRN

## 2022-05-11 MED ORDER — PHENYLEPHRINE HCL-NACL 20-0.9 MG/250ML-% IV SOLN
INTRAVENOUS | Status: DC | PRN
  Administered 2022-05-11: 40 ug/min via INTRAVENOUS

## 2022-05-11 MED ORDER — PANTOPRAZOLE SODIUM 40 MG PO TBEC: 40.0000 mg | DELAYED_RELEASE_TABLET | Freq: Every day | ORAL | Status: DC

## 2022-05-11 MED ORDER — 0.9 % SODIUM CHLORIDE (POUR BTL) OPTIME
TOPICAL | Status: DC | PRN
  Administered 2022-05-11: 1000 mL

## 2022-05-11 MED ORDER — STERILE WATER FOR IRRIGATION IR SOLN
Status: DC | PRN
  Administered 2022-05-11: 2000 mL

## 2022-05-11 MED ORDER — ALBUTEROL SULFATE HFA 108 (90 BASE) MCG/ACT IN AERS
INHALATION_SPRAY | RESPIRATORY_TRACT | Status: DC | PRN
  Administered 2022-05-11: 4 via RESPIRATORY_TRACT
  Administered 2022-05-11: 8 via RESPIRATORY_TRACT

## 2022-05-11 MED ORDER — CEFAZOLIN SODIUM-DEXTROSE 2-4 GM/100ML-% IV SOLN
2.0000 g | Freq: Four times a day (QID) | INTRAVENOUS | Status: AC
  Administered 2022-05-11 – 2022-05-12 (×3): 2 g via INTRAVENOUS
  Filled 2022-05-11 (×3): qty 100

## 2022-05-11 MED ORDER — BISMUTH SUBSALICYLATE 262 MG/15ML PO SUSP: 30.0000 mL | Freq: Four times a day (QID) | ORAL | Status: DC | PRN

## 2022-05-11 MED ORDER — ROCURONIUM BROMIDE 10 MG/ML (PF) SYRINGE
PREFILLED_SYRINGE | INTRAVENOUS | Status: DC | PRN
  Administered 2022-05-11: 100 mg via INTRAVENOUS

## 2022-05-11 MED ORDER — CEFAZOLIN SODIUM-DEXTROSE 2-4 GM/100ML-% IV SOLN
2.0000 g | INTRAVENOUS | Status: AC
  Administered 2022-05-11: 2 g via INTRAVENOUS
  Filled 2022-05-11: qty 100

## 2022-05-11 MED ORDER — OXYCODONE HCL 5 MG PO TABS: 5.0000 mg | ORAL_TABLET | Freq: Once | ORAL | Status: DC | PRN

## 2022-05-11 MED ORDER — SUGAMMADEX SODIUM 200 MG/2ML IV SOLN
INTRAVENOUS | Status: DC | PRN
  Administered 2022-05-11: 200 mg via INTRAVENOUS

## 2022-05-11 MED ORDER — PANTOPRAZOLE SODIUM 20 MG PO TBEC
20.0000 mg | DELAYED_RELEASE_TABLET | Freq: Every day | ORAL | Status: DC
  Filled 2022-05-11: qty 1

## 2022-05-11 MED ORDER — MENTHOL 3 MG MT LOZG: 1.0000 | LOZENGE | OROMUCOSAL | Status: DC | PRN

## 2022-05-11 MED ORDER — MIDAZOLAM HCL 2 MG/2ML IJ SOLN
2.0000 mg | INTRAMUSCULAR | Status: DC
  Administered 2022-05-11 (×2): 1 mg via INTRAVENOUS
  Filled 2022-05-11: qty 2

## 2022-05-11 MED ORDER — METHOCARBAMOL 500 MG PO TABS
500.0000 mg | ORAL_TABLET | Freq: Four times a day (QID) | ORAL | Status: DC | PRN
  Administered 2022-05-12 (×2): 500 mg via ORAL
  Filled 2022-05-11 (×2): qty 1

## 2022-05-11 MED ORDER — BUPIVACAINE LIPOSOME 1.3 % IJ SUSP
INTRAMUSCULAR | Status: DC | PRN
  Administered 2022-05-11: 10 mL via PERINEURAL

## 2022-05-11 MED ORDER — BISACODYL 5 MG PO TBEC: 5.0000 mg | DELAYED_RELEASE_TABLET | Freq: Every day | ORAL | Status: DC | PRN

## 2022-05-11 MED ORDER — CHLORHEXIDINE GLUCONATE 0.12 % MT SOLN
15.0000 mL | Freq: Once | OROMUCOSAL | Status: AC
  Administered 2022-05-11: 15 mL via OROMUCOSAL

## 2022-05-11 MED ORDER — DEXTROMETHORPHAN HBR 15 MG/5ML PO SYRP: 10.0000 mL | ORAL_SOLUTION | Freq: Four times a day (QID) | ORAL | Status: DC | PRN

## 2022-05-11 MED ORDER — BENZONATATE 100 MG PO CAPS
100.0000 mg | ORAL_CAPSULE | Freq: Three times a day (TID) | ORAL | Status: DC | PRN
  Administered 2022-05-11 – 2022-05-12 (×3): 100 mg via ORAL
  Filled 2022-05-11 (×3): qty 1

## 2022-05-11 MED ORDER — ORAL CARE MOUTH RINSE: 15.0000 mL | Freq: Once | OROMUCOSAL | Status: AC

## 2022-05-11 MED ORDER — METFORMIN HCL ER 500 MG PO TB24
1000.0000 mg | ORAL_TABLET | Freq: Every evening | ORAL | Status: DC
  Administered 2022-05-11: 500 mg via ORAL
  Filled 2022-05-11: qty 2

## 2022-05-11 MED ORDER — GUAIFENESIN ER 600 MG PO TB12: 600.0000 mg | ORAL_TABLET | Freq: Two times a day (BID) | ORAL | Status: DC | PRN

## 2022-05-11 MED ORDER — BUPIVACAINE-EPINEPHRINE (PF) 0.25% -1:200000 IJ SOLN
INTRAMUSCULAR | Status: AC
  Filled 2022-05-11: qty 30

## 2022-05-11 MED ORDER — ROCURONIUM BROMIDE 10 MG/ML (PF) SYRINGE
PREFILLED_SYRINGE | INTRAVENOUS | Status: AC
  Filled 2022-05-11: qty 10

## 2022-05-11 MED ORDER — ONDANSETRON HCL 4 MG/2ML IJ SOLN
INTRAMUSCULAR | Status: DC | PRN
  Administered 2022-05-11: 4 mg via INTRAVENOUS

## 2022-05-11 MED ORDER — BUPIVACAINE HCL (PF) 0.5 % IJ SOLN
INTRAMUSCULAR | Status: DC | PRN
  Administered 2022-05-11: 15 mL via PERINEURAL

## 2022-05-11 MED ORDER — ZINC SULFATE 220 (50 ZN) MG PO CAPS
220.0000 mg | ORAL_CAPSULE | Freq: Every day | ORAL | Status: DC
  Administered 2022-05-12: 220 mg via ORAL
  Filled 2022-05-11: qty 1

## 2022-05-11 MED ORDER — ALBUTEROL SULFATE HFA 108 (90 BASE) MCG/ACT IN AERS: 2.0000 | INHALATION_SPRAY | Freq: Four times a day (QID) | RESPIRATORY_TRACT | Status: DC

## 2022-05-11 MED ORDER — ONDANSETRON HCL 4 MG PO TABS: 4.0000 mg | ORAL_TABLET | Freq: Three times a day (TID) | ORAL | Status: DC | PRN

## 2022-05-11 MED ORDER — ONDANSETRON HCL 4 MG/2ML IJ SOLN
4.0000 mg | Freq: Four times a day (QID) | INTRAMUSCULAR | Status: DC | PRN
  Administered 2022-05-12: 4 mg via INTRAVENOUS
  Filled 2022-05-11: qty 2

## 2022-05-11 MED ORDER — SODIUM CHLORIDE 0.9 % IV SOLN
12.5000 mg | Freq: Four times a day (QID) | INTRAVENOUS | Status: DC | PRN
  Administered 2022-05-11 – 2022-05-12 (×2): 12.5 mg via INTRAVENOUS
  Filled 2022-05-11: qty 12.5
  Filled 2022-05-11: qty 0.5
  Filled 2022-05-11: qty 12.5

## 2022-05-11 MED ORDER — HYDROMORPHONE HCL 1 MG/ML IJ SOLN
0.2500 mg | INTRAMUSCULAR | Status: DC | PRN
  Administered 2022-05-11 (×2): 0.5 mg via INTRAVENOUS

## 2022-05-11 MED ORDER — ACETAMINOPHEN 500 MG PO TABS
1000.0000 mg | ORAL_TABLET | Freq: Once | ORAL | Status: AC
  Administered 2022-05-11: 1000 mg via ORAL
  Filled 2022-05-11: qty 2

## 2022-05-11 MED ORDER — INSULIN ASPART 100 UNIT/ML IJ SOLN: 0.0000 [IU] | Freq: Three times a day (TID) | INTRAMUSCULAR | Status: DC

## 2022-05-11 MED ORDER — DEXAMETHASONE SODIUM PHOSPHATE 10 MG/ML IJ SOLN
INTRAMUSCULAR | Status: AC
  Filled 2022-05-11: qty 1

## 2022-05-11 MED ORDER — IPRATROPIUM BROMIDE 0.06 % NA SOLN: 2.0000 | Freq: Four times a day (QID) | NASAL | Status: DC

## 2022-05-11 MED ORDER — PHENOL 1.4 % MT LIQD: 1.0000 | OROMUCOSAL | Status: DC | PRN

## 2022-05-11 MED ORDER — CLOTRIMAZOLE 1 % VA CREA
1.0000 | TOPICAL_CREAM | Freq: Every day | VAGINAL | Status: DC
  Administered 2022-05-11: 1 via VAGINAL
  Filled 2022-05-11: qty 45

## 2022-05-11 SURGICAL SUPPLY — 73 items
BAG COUNTER SPONGE SURGICOUNT (BAG) IMPLANT
BAG ZIPLOCK 12X15 (MISCELLANEOUS) IMPLANT
BIT DRILL 1.6MX128 (BIT) IMPLANT
BIT DRILL 170X2.5X (BIT) IMPLANT
BIT DRL 170X2.5X (BIT) ×1
BLADE SAG 18X100X1.27 (BLADE) ×1 IMPLANT
CLSR STERI-STRIP ANTIMIC 1/2X4 (GAUZE/BANDAGES/DRESSINGS) IMPLANT
COVER BACK TABLE 60X90IN (DRAPES) ×1 IMPLANT
COVER SURGICAL LIGHT HANDLE (MISCELLANEOUS) ×1 IMPLANT
DRAPE INCISE IOBAN 66X45 STRL (DRAPES) ×1 IMPLANT
DRAPE ORTHO SPLIT 77X108 STRL (DRAPES) ×2
DRAPE SHEET LG 3/4 BI-LAMINATE (DRAPES) ×1 IMPLANT
DRAPE SURG ORHT 6 SPLT 77X108 (DRAPES) ×2 IMPLANT
DRAPE TOP 10253 STERILE (DRAPES) ×1 IMPLANT
DRAPE U-SHAPE 47X51 STRL (DRAPES) ×1 IMPLANT
DRILL 2.5 (BIT) ×1
DRSG ADAPTIC 3X8 NADH LF (GAUZE/BANDAGES/DRESSINGS) ×1 IMPLANT
DRSG EMULSION OIL 3X16 NADH (GAUZE/BANDAGES/DRESSINGS) IMPLANT
DURAPREP 26ML APPLICATOR (WOUND CARE) ×1 IMPLANT
ELECT BLADE TIP CTD 4 INCH (ELECTRODE) ×1 IMPLANT
ELECT NDL TIP 2.8 STRL (NEEDLE) ×1 IMPLANT
ELECT NEEDLE TIP 2.8 STRL (NEEDLE) ×1 IMPLANT
ELECT REM PT RETURN 15FT ADLT (MISCELLANEOUS) ×1 IMPLANT
EPI LT SZ 1 (Orthopedic Implant) ×1 IMPLANT
EPIPHYSIS LT SZ 1 (Orthopedic Implant) IMPLANT
FACESHIELD WRAPAROUND (MASK) ×1 IMPLANT
FACESHIELD WRAPAROUND OR TEAM (MASK) ×1 IMPLANT
GAUZE PAD ABD 8X10 STRL (GAUZE/BANDAGES/DRESSINGS) ×1 IMPLANT
GAUZE SPONGE 4X4 12PLY STRL (GAUZE/BANDAGES/DRESSINGS) ×1 IMPLANT
GLENOSPHERE DELTA XTEND LAT 38 (Miscellaneous) IMPLANT
GLOVE BIOGEL PI IND STRL 7.5 (GLOVE) ×1 IMPLANT
GLOVE BIOGEL PI IND STRL 8.5 (GLOVE) ×1 IMPLANT
GLOVE ORTHO TXT STRL SZ7.5 (GLOVE) ×1 IMPLANT
GLOVE SURG ORTHO 8.5 STRL (GLOVE) ×1 IMPLANT
GOWN STRL REUS W/ TWL XL LVL3 (GOWN DISPOSABLE) ×2 IMPLANT
GOWN STRL REUS W/TWL XL LVL3 (GOWN DISPOSABLE) ×2
KIT BASIN OR (CUSTOM PROCEDURE TRAY) ×1 IMPLANT
KIT TURNOVER KIT A (KITS) IMPLANT
MANIFOLD NEPTUNE II (INSTRUMENTS) ×1 IMPLANT
METAGLENE DELTA EXTEND (Trauma) IMPLANT
METAGLENE DXTEND (Trauma) ×1 IMPLANT
NDL MAYO CATGUT SZ4 TPR NDL (NEEDLE) ×1 IMPLANT
NEEDLE MAYO CATGUT SZ4 (NEEDLE) ×1 IMPLANT
NS IRRIG 1000ML POUR BTL (IV SOLUTION) ×1 IMPLANT
PACK SHOULDER (CUSTOM PROCEDURE TRAY) ×1 IMPLANT
PIN GUIDE 1.2 (PIN) IMPLANT
PIN GUIDE GLENOPHERE 1.5MX300M (PIN) IMPLANT
PIN METAGLENE 2.5 (PIN) IMPLANT
PROTECTOR NERVE ULNAR (MISCELLANEOUS) ×1 IMPLANT
RESTRAINT HEAD UNIVERSAL NS (MISCELLANEOUS) ×1 IMPLANT
SCREW 4.5X18MM (Screw) ×1 IMPLANT
SCREW 4.5X36MM (Screw) IMPLANT
SCREW BN 18X4.5XSTRL SHLDR (Screw) IMPLANT
SCREW LOCK DELTA XTEND 4.5X30 (Screw) IMPLANT
SLING ARM FOAM STRAP LRG (SOFTGOODS) IMPLANT
SMARTMIX MINI TOWER (MISCELLANEOUS)
SPACER 38 PLUS 3 (Spacer) IMPLANT
SPIKE FLUID TRANSFER (MISCELLANEOUS) ×1 IMPLANT
SPONGE T-LAP 4X18 ~~LOC~~+RFID (SPONGE) ×1 IMPLANT
STEM HUMERAL SZ8 STANDARD (Stem) ×1 IMPLANT
STEM HUMERAL SZ8 STD (Stem) IMPLANT
STRIP CLOSURE SKIN 1/2X4 (GAUZE/BANDAGES/DRESSINGS) ×1 IMPLANT
SUCTION FRAZIER HANDLE 10FR (MISCELLANEOUS) ×1
SUCTION TUBE FRAZIER 10FR DISP (MISCELLANEOUS) ×1 IMPLANT
SUT FIBERWIRE #2 38 T-5 BLUE (SUTURE) ×2
SUT MNCRL AB 4-0 PS2 18 (SUTURE) ×1 IMPLANT
SUT VIC AB 0 CT1 36 (SUTURE) ×2 IMPLANT
SUT VIC AB 0 CT2 27 (SUTURE) ×1 IMPLANT
SUT VIC AB 2-0 CT1 27 (SUTURE) ×1
SUT VIC AB 2-0 CT1 TAPERPNT 27 (SUTURE) ×1 IMPLANT
SUTURE FIBERWR #2 38 T-5 BLUE (SUTURE) ×2 IMPLANT
TOWEL OR 17X26 10 PK STRL BLUE (TOWEL DISPOSABLE) ×1 IMPLANT
TOWER SMARTMIX MINI (MISCELLANEOUS) IMPLANT

## 2022-05-11 NOTE — Brief Op Note (Signed)
05/11/2022  1:50 PM  PATIENT:  Alyssa Montgomery  61 y.o. female  PRE-OPERATIVE DIAGNOSIS:  Rotator cuff arthropathy of left shoulder  POST-OPERATIVE DIAGNOSIS:  Rotator cuff arthropathy of left shoulder  PROCEDURE:  Procedure(s) with comments: REVERSE SHOULDER ARTHROPLASTY (Left) - with ISB DePuy Delta Xtend with Subscap repair  SURGEON:  Surgeon(s) and Role:    Netta Cedars, MD - Primary  PHYSICIAN ASSISTANT:   ASSISTANTS: Ventura Bruns, PA-C   ANESTHESIA:   regional and general  EBL:  200 mL   BLOOD ADMINISTERED:none  DRAINS: none   LOCAL MEDICATIONS USED:  MARCAINE     SPECIMEN:  No Specimen  DISPOSITION OF SPECIMEN:  N/A  COUNTS:  YES  TOURNIQUET:  * No tourniquets in log *  DICTATION: .Other Dictation: Dictation Number [id number]  PLAN OF CARE: Admit for overnight observation  PATIENT DISPOSITION:  PACU - hemodynamically stable.   Delay start of Pharmacological VTE agent (>24hrs) due to surgical blood loss or risk of bleeding: not applicable

## 2022-05-11 NOTE — Care Plan (Signed)
Ortho Bundle Case Management Note  Patient Details  Name: Alyssa Montgomery MRN: [id number] Date of Birth: Jun 03, 1961                  L Rev TSA on 05/11/22.  DCP: Home with husband.  DME: Sling and ice machine provided by hospital.  PT: HEP   DME Arranged:  N/A DME Agency:      Additional Comments: Please contact me with any questions of if this plan should need to change.   Dario Ave, Case Manager  [facility] [phone] 05/11/2022, 2:50 PM

## 2022-05-11 NOTE — Discharge Instructions (Addendum)
Ice to the shoulder constantly.  Keep the incision covered and clean and dry for one week, then ok to get it wet in the shower. Leave the incision uncovered after one week following surgery.  Do exercise as instructed several times per day.  DO NOT reach behind your back or push up out of a chair with the operative arm.  Use a sling while you are up and around for comfort, may remove while seated.  Keep pillow propped behind the operative elbow.  Follow up with Dr Veverly Fells in two weeks in the office, call [phone] for appt  Call Dr Veverly Fells at [phone] cell with any questions or concerns

## 2022-05-11 NOTE — Anesthesia Procedure Notes (Addendum)
Procedure Name: Intubation Date/Time: 05/11/2022 12:06 PM  Performed by: Genelle Bal, CRNAPre-anesthesia Checklist: Patient identified, Emergency Drugs available, Suction available and Patient being monitored Patient Re-evaluated:Patient Re-evaluated prior to induction Oxygen Delivery Method: Circle system utilized Preoxygenation: Pre-oxygenation with 100% oxygen Induction Type: IV induction Ventilation: Mask ventilation without difficulty Laryngoscope Size: Miller and 2 Grade View: Grade I Tube type: Oral Tube size: 7.0 mm Number of attempts: 1 Airway Equipment and Method: Stylet and Oral airway Placement Confirmation: ETT inserted through vocal cords under direct vision, positive ETCO2 and breath sounds checked- equal and bilateral Secured at: 21 cm Tube secured with: Tape Dental Injury: Teeth and Oropharynx as per pre-operative assessment

## 2022-05-11 NOTE — Transfer of Care (Signed)
Immediate Anesthesia Transfer of Care Note  Patient: Alyssa Montgomery  Procedure(s) Performed: REVERSE SHOULDER ARTHROPLASTY (Left: Shoulder)  Patient Location: PACU  Anesthesia Type:GA combined with regional for post-op pain  Level of Consciousness: awake, alert , and oriented  Airway & Oxygen Therapy: Patient Spontanous Breathing and Patient connected to face mask oxygen  Post-op Assessment: Report given to RN and Post -op Vital signs reviewed and stable  Post vital signs: Reviewed and stable  Last Vitals:  Vitals Value Taken Time  BP 147/102   Temp    Pulse 85 05/11/22 1403  Resp 17 05/11/22 1403  SpO2 100 % 05/11/22 1403  Vitals shown include unvalidated device data.  Last Pain:  Vitals:   05/11/22 1047  TempSrc:   PainSc: 0-No pain         Complications: No notable events documented.

## 2022-05-11 NOTE — Anesthesia Procedure Notes (Signed)
Anesthesia Regional Block: Interscalene brachial plexus block   Pre-Anesthetic Checklist: , timeout performed,  Correct Patient, Correct Site, Correct Laterality,  Correct Procedure, Correct Position, site marked,  Risks and benefits discussed,  Surgical consent,  Pre-op evaluation,  At surgeon's request and post-op pain management  Laterality: Upper and Left  Prep: Maximum Sterile Barrier Precautions used, chloraprep       Needles:  Injection technique: Single-shot  Needle Type: Echogenic Needle     Needle Length: 5cm  Needle Gauge: 21     Additional Needles:   Procedures:,,,, ultrasound used (permanent image in chart),,    Narrative:  Start time: 05/11/2022 10:40 AM End time: 05/11/2022 10:46 AM Injection made incrementally with aspirations every 5 mL.  Performed by: Personally  Anesthesiologist: Barnet Glasgow, MD  Additional Notes: Block assessed prior to procedure. Patient tolerated procedure well.

## 2022-05-11 NOTE — Interval H&P Note (Signed)
History and Physical Interval Note:  05/11/2022 9:16 AM  Alyssa Montgomery  has presented today for surgery, with the diagnosis of Rotator cuff arthropathy of left shoulder.  The various methods of treatment have been discussed with the patient and family. After consideration of risks, benefits and other options for treatment, the patient has consented to  Procedure(s) with comments: REVERSE SHOULDER ARTHROPLASTY (Left) - with ISB as a surgical intervention.  The patient's history has been reviewed, patient examined, no change in status, stable for surgery.  I have reviewed the patient's chart and labs.  Questions were answered to the patient's satisfaction.     Augustin Schooling

## 2022-05-11 NOTE — Op Note (Unsigned)
NAME: JANIE, CAPP MEDICAL RECORD NO: [id number] ACCOUNT NO: [id number] DATE OF BIRTH: 10-03-1960 FACILITY: [facility]ATION: [facility] PHYSICIAN: Doran Heater. 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  PROCEDURE PERFORMED:  Left reverse shoulder replacement using DePuy Delta Xtend prosthesis with subscap repair.  ATTENDING SURGEON:  Doran Heater. Veverly Fells, MD  ASSISTANT:  Charletta Cousin [place], [place], who was scrubbed during the entire procedure and necessary for satisfactory completion of surgery.  ANESTHESIA:  General anesthesia was used plus interscalene block.  ESTIMATED BLOOD LOSS:  200 mL  FLUID REPLACEMENT:  1500 mL crystalloid.  COUNTS:  Instrument counts correct.  COMPLICATIONS:  No complications.  ANTIBIOTICS:  Perioperative antibiotics were given.  INDICATIONS:  The patient is a 61 year old female with worsening left shoulder pain and dysfunction secondary to end-stage arthritis and AVN and rotator cuff insufficiency.  The patient has failed an extended period of conservative management and desires  operative treatment with reverse shoulder replacement in order to eliminate pain and to restore function.  Informed consent obtained.  DESCRIPTION OF PROCEDURE:  After an adequate level of general anesthesia was achieved, plus interscalene block placed and positioned in modified beach chair position.  Left shoulder correctly identified and sterilely prepped and draped in the usual  manner.  Timeout called, verifying correct patient, correct site. We entered the patient's shoulder using a standard deltopectoral approach, starting at the coracoid process extending down to the anterior humerus, dissection down through subcutaneous  tissues using Bovie.  We identified the cephalic vein and took that laterally of the  deltoid.  Pectoralis was taken medially.  Conjoined tendon identified and retracted medially.  Deep retractors were placed.  We identified the biceps tendon and  whipstitched that, tenodesing it in situ with 0 Vicryl figure-of-eight suture x2, incorporating part of the pectoralis tendon.  We then released the subscapularis subperiosteally off the lesser tuberosity and tagged for repair with #2 FiberWire suture.   We then released the inferior capsule, progressively externally rotating and delivering the humerus out of the wound.  There was bone-on-bone wear and tear arthritis in the shoulder. We released the remainder of the posterior supraspinatus and a little  bit of the infraspinatus for exposure.  We entered the proximal humerus with a 6 mm reamer, reaming up to a size 8.  We then placed our 8 mm T-handle guide and resected the head at 20 degrees of retroversion with the oscillating saw.  We then removed  excess osteophytes with a rongeur.  We then subluxed the humerus posteriorly, gaining good exposure of the glenoid face.  We removed the capsule and the labrum, protecting the axillary nerve.  We then placed our deep retractors.  We centered our guide  pin low on the glenoid.  There was no cartilage remaining on the glenoid.  We reamed for the metaglene baseplate and then drilled our central peg hole.  We impacted the metaglene into position.  We had great baseplate support and security.  We then  placed a 30 screw inferiorly, 36 screw superiorly and an 18 nonlocked posteriorly.  We had excellent baseplate security and good screw purchase.  We then selected a 38 standard glenosphere plus 0 and attached that to the baseplate with a screwdriver.  I  did a finger sweep to make sure we had no soft tissue caught up under the baseplate or  the glenosphere.  We then went to the humeral side and reamed for the 1 left metaphysis.  We then trialled with an 8 stem and the 1 left metaphysis set on the 0  setting and  impacted in 20 degrees of retroversion.  We trialed with a 38+3 trial.  We were happy with our soft tissue balancing and stability.  We removed all trial components, irrigated thoroughly. I drilled holes in the lesser tuberosity and placed #2  FiberWire suture for repair of the subscap.  We then used irrigation for the shaft and then used available bone graft from the humeral head with impaction grafting technique and impacted the HA coated press-fit stem size 8 and the 1 left metaphysis set  on the 0 setting and impacted in 20 degrees of retroversion.  Next, we placed the real 38+3 poly on the humeral tray and impacted that until it was seated. We then reduced the shoulder.  We were happy with our soft tissue balancing and stability with no  gapping with inferior pull or external rotation.  We irrigated thoroughly.  We then repaired the subscapularis anatomically back to bone.  We then irrigated again and then repaired deltopectoral interval with 0 Vicryl suture followed by 2-0 Vicryl for  subcutaneous closure and 4-0 Monocryl for skin.  Steri-Strips applied followed by sterile dressing.  The patient tolerated surgery well.   VAI D: 05/11/2022 1:55:44 pm T: 05/11/2022 10:05:00 pm  JOB: [id number]/ [id number]

## 2022-05-12 DIAGNOSIS — Z79899 Other long term (current) drug therapy: Secondary | ICD-10-CM | POA: Diagnosis not present

## 2022-05-12 DIAGNOSIS — I10 Essential (primary) hypertension: Secondary | ICD-10-CM | POA: Diagnosis not present

## 2022-05-12 DIAGNOSIS — Z8673 Personal history of transient ischemic attack (TIA), and cerebral infarction without residual deficits: Secondary | ICD-10-CM | POA: Diagnosis not present

## 2022-05-12 DIAGNOSIS — Z96642 Presence of left artificial hip joint: Secondary | ICD-10-CM | POA: Diagnosis not present

## 2022-05-12 DIAGNOSIS — M75102 Unspecified rotator cuff tear or rupture of left shoulder, not specified as traumatic: Secondary | ICD-10-CM | POA: Diagnosis not present

## 2022-05-12 DIAGNOSIS — E119 Type 2 diabetes mellitus without complications: Secondary | ICD-10-CM | POA: Diagnosis not present

## 2022-05-12 DIAGNOSIS — R531 Weakness: Secondary | ICD-10-CM | POA: Diagnosis not present

## 2022-05-12 DIAGNOSIS — Z87891 Personal history of nicotine dependence: Secondary | ICD-10-CM | POA: Diagnosis not present

## 2022-05-12 LAB — GLUCOSE, CAPILLARY
Glucose-Capillary: 148 mg/dL — ABNORMAL HIGH (ref 70–99)
Glucose-Capillary: 226 mg/dL — ABNORMAL HIGH (ref 70–99)
Glucose-Capillary: 163 mg/dL — ABNORMAL HIGH (ref 70–99)

## 2022-05-12 LAB — BASIC METABOLIC PANEL
Anion gap: 9 (ref 5–15)
BUN: 14 mg/dL (ref 8–23)
CO2: 26 mmol/L (ref 22–32)
Calcium: 8.9 mg/dL (ref 8.9–10.3)
Chloride: 104 mmol/L (ref 98–111)
Creatinine, Ser: 0.74 mg/dL (ref 0.44–1.00)
GFR, Estimated: >60 mL/min (ref >60)
Glucose, Bld: 214 mg/dL — ABNORMAL HIGH (ref 70–99)
Potassium: 3.8 mmol/L (ref 3.5–5.1)
Sodium: 139 mmol/L (ref 135–145)

## 2022-05-12 LAB — HEMOGLOBIN AND HEMATOCRIT, BLOOD
HCT: 39.7 % (ref 36.0–46.0)
Hemoglobin: 12.1 g/dL (ref 12.0–15.0)

## 2022-05-12 MED ORDER — PROMETHAZINE HCL 12.5 MG PO TABS: 12.5000 mg | ORAL_TABLET | Freq: Four times a day (QID) | ORAL | 0 refills | Status: AC | PRN

## 2022-05-12 MED ORDER — PROMETHAZINE HCL 25 MG PO TABS
12.5000 mg | ORAL_TABLET | Freq: Three times a day (TID) | ORAL | Status: DC | PRN
  Administered 2022-05-12: 12.5 mg via ORAL
  Filled 2022-05-12: qty 1

## 2022-05-12 NOTE — Progress Notes (Signed)
The patient is alert and oriented and has been seen by her physician. The orders for discharge were written. IV has been removed. Went over discharge instructions with patient and family. She is being discharged via wheelchair with all of her belongings.  

## 2022-05-12 NOTE — TOC Transition Note (Signed)
Transition of Care [facility]) - CM/SW Discharge Note   Patient Details  Name: Alyssa Montgomery MRN: [id number] Date of Birth: 1961/02/07  Transition of Care [facility]) CM/SW Contact:  Vassie Moselle, LCSW Phone Number: 05/12/2022, 12:32 PM   Clinical Narrative:    Spoke with pt over the phone to discuss home health PT and aide being arranged. Pt has no preference for agency used. Pt also agreeable to having 3in1 ordered. HHPT/Aide has been arranged with Alvis Lemmings. 3in1 is to be delivered to pt's room by [employer].    Final next level of care: [place] Barriers to Discharge: No Barriers Identified   Patient Goals and CMS Choice Patient states their goals for this hospitalization and ongoing recovery are:: To go home CMS Medicare.gov Compare Post Acute Care list provided to:: Patient Choice offered to / list presented to : Patient  Discharge Placement                       Discharge Plan and Services                DME Arranged: Bedside commode DME Agency: [employer] Date DME Agency Contacted: 05/12/22 Time DME Agency Contacted: 1232 Representative spoke with at DME Agency: [place]: PT, Nurse's Aide Kathryn Agency: [place] Date [place]: 05/12/22 Time [place]: 1232 Representative spoke with at [place]: [place] Determinants of Health ([place]) Interventions     Readmission Risk Interventions     No data to display

## 2022-05-12 NOTE — TOC Progression Note (Signed)
Transition of Care [facility]) - Progression Note    Patient Details  Name: Alyssa Montgomery MRN: [id number] Date of Birth: 25-Sep-1960  Transition of Care [facility]) CM/SW [place], [place] Phone Number: 05/12/2022, 9:23 AM  Clinical Narrative:    Spoke with pt via t/c at RN request. Pt shares she has been working on getting a home health aide through her insurance with [employer]. Pt has been recommended for HEP. Pt shares she does not feel that she will be able to cook and care for herself on her own with one hand. CSW shared that home health agencies will not provide home health aide services unless PT is also recommended. Pt shares she will work to get home health aide arranged on her own as the hospital is unable to arrange this.         Expected Discharge Plan and Services           Expected Discharge Date: 05/12/22               DME Arranged: N/A                     Social Determinants of Health (SDOH) Interventions    Readmission Risk Interventions     No data to display

## 2022-05-12 NOTE — Discharge Summary (Signed)
In most cases prophylactic antibiotics for Dental procdeures after total joint surgery are not necessary.  Exceptions are as follows:  1. History of prior total joint infection  2. Severely immunocompromised (Organ Transplant, cancer chemotherapy, Rheumatoid biologic meds such as [place])  3. Poorly controlled diabetes (A1C &gt; 8.0, blood glucose over 200)  If you have one of these conditions, contact your surgeon for an antibiotic prescription, prior to your dental procedure. Orthopedic Discharge Summary        Physician Discharge Summary  Patient ID: Alyssa Montgomery MRN: [id number] DOB/AGE: 09/08/1960 61 y.o.  Admit date: 05/11/2022 Discharge date: 05/12/2022   Procedures:  Procedure(s) (LRB): REVERSE SHOULDER ARTHROPLASTY (Left)  Attending Physician:  Dr. Esmond Plants  Admission Diagnoses:   Left shoulder OA, RCT arthropathy  Discharge Diagnoses:  same   Past Medical History:  Diagnosis Date   Anxiety    Bilateral carpal tunnel syndrome 03/03/2018   Bronchitis    Cervical spondylosis without myelopathy 07/29/2018   Cervicalgia 11/16/2014   Cervicogenic headache 07/29/2018   Chronic bilateral low back pain with bilateral sciatica 09/18/2018   Claudication ([place]) 09/03/2016   Diverticulitis    FIBROIDS, UTERUS 03/09/2010   Qualifier: Diagnosis of  By: Carlean Purl MD, Tonna Boehringer E    Fibromyalgia    Fibromyalgia 06/18/2014   Gait abnormality 11/22/2020   GERD (gastroesophageal reflux disease)    Goiter 02/28/2013   Headache    Healthcare maintenance 04/17/2015   History of cervical dysplasia 01/18/2016   Hyperlipidemia    Hypertension associated with diabetes ([place]) 12/20/2019   Hypertension associated with diabetes ([place]) 12/20/2019   Left carpal tunnel syndrome 01/27/2018   Left foot pain 12/20/2019   Left hip pain 11/23/2019   Neck pain 11/22/2020   Neuropathy    Non-seasonal allergic rhinitis 07/13/2019   Pain in right ankle and joints of right foot  03/17/2019   Plantar fasciitis    Pre-operative clearance 08/12/2020   Primary osteoarthritis of left hip 09/12/2020   Right leg numbness 11/22/2020   Spinal stenosis of cervical region 07/29/2018   Substance abuse ([place])    10 years ago ( cocaine)   TIA (transient ischemic attack) 08/10/2016   Tobacco abuse 01/18/2012   Tobacco use disorder 04/17/2015   Trochanteric bursitis of left hip 11/23/2019   Urinary frequency 11/30/2019   UTI (urinary tract infection) 06/19/2012   UTI (urinary tract infection) 06/19/2012   Viral URI with cough 02/28/2013    PCP: Ezequiel Essex, MD   Discharged Condition: stable  Hospital Course:  Patient underwent the above stated procedure on 05/11/2022. Patient tolerated the procedure well and brought to the recovery room in good condition and subsequently to the floor. Patient had an uncomplicated hospital course and was stable for discharge.   Disposition: Discharge disposition: 01-Home or Self Care      with follow up in 2 weeks    Follow-up Information     Netta Cedars, MD. Schedule an appointment as soon as possible for a visit in 2 week(s).   Specialty: Orthopedic Surgery Why: call [phone] for appt Contact information: [address] [place] [address] [phone]                 Dental Antibiotics:  In most cases prophylactic antibiotics for Dental procdeures after total joint surgery are not necessary.  Exceptions are as follows:  1. History of prior total joint infection  2. Severely immunocompromised (Organ Transplant, cancer chemotherapy, Rheumatoid biologic meds such  as [place])  3. Poorly controlled diabetes (A1C &gt; 8.0, blood glucose over 200)  If you have one of these conditions, contact your surgeon for an antibiotic prescription, prior to your dental procedure.  Discharge Instructions     Call MD / Call 911   Complete by: As directed    If you experience chest pain or shortness of breath,  CALL 911 and be transported to the hospital emergency room.  If you develope a fever above 101 F, pus (white drainage) or increased drainage or redness at the wound, or calf pain, call your surgeon's office.   Constipation Prevention   Complete by: As directed    Drink plenty of fluids.  Prune juice may be helpful.  You may use a stool softener, such as Colace (over the counter) 100 mg twice a day.  Use MiraLax (over the counter) for constipation as needed.   Diet - low sodium heart healthy   Complete by: As directed    Increase activity slowly as tolerated   Complete by: As directed    Post-operative opioid taper instructions:   Complete by: As directed    POST-OPERATIVE OPIOID TAPER INSTRUCTIONS: It is important to wean off of your opioid medication as soon as possible. If you do not need pain medication after your surgery it is ok to stop day one. Opioids include: Codeine, Hydrocodone(Norco, Vicodin), Oxycodone(Percocet, oxycontin) and hydromorphone amongst others.  Long term and even short term use of opiods can cause: Increased pain response Dependence Constipation Depression Respiratory depression And more.  Withdrawal symptoms can include Flu like symptoms Nausea, vomiting And more Techniques to manage these symptoms Hydrate well Eat regular healthy meals Stay active Use relaxation techniques(deep breathing, meditating, yoga) Do Not substitute Alcohol to help with tapering If you have been on opioids for less than two weeks and do not have pain than it is ok to stop all together.  Plan to wean off of opioids This plan should start within one week post op of your joint replacement. Maintain the same interval or time between taking each dose and first decrease the dose.  Cut the total daily intake of opioids by one tablet each day Next start to increase the time between doses. The last dose that should be eliminated is the evening dose.          Allergies as of  05/12/2022       Reactions   Zanaflex [tizanidine Hcl] Other (See Comments)   Syncope   Penicillins Other (See Comments)   Unknown childhood reaction. Has patient had a PCN reaction causing immediate rash, facial/tongue/throat swelling, SOB or lightheadedness with hypotension: Unknown Has patient had a PCN reaction causing severe rash involving mucus membranes or skin necrosis: Unknown Has patient had a PCN reaction that required hospitalization: Unknown Has patient had a PCN reaction occurring within the last 10 years: No Tolerated Cephalosporin Date: 09/14/20.   Sulfonamide Derivatives Itching        Medication List     STOP taking these medications    naproxen 500 MG tablet Commonly known as: NAPROSYN   ondansetron 4 MG tablet Commonly known as: ZOFRAN   traMADol 50 MG tablet Commonly known as: ULTRAM       TAKE these medications    albuterol 108 (90 Base) MCG/ACT inhaler Commonly known as: VENTOLIN HFA INHALE 2 PUFFS INTO THE LUNGS EVERY 6 HOURS AS NEEDED FOR WHEEZING/SHORTNESS OF BREATH   benzonatate 100 MG capsule Commonly known as: TESSALON Take  1 capsule (100 mg total) by mouth 3 (three) times daily as needed for cough.   bisacodyl 5 MG EC tablet Commonly known as: DULCOLAX Take 5 mg by mouth daily as needed for moderate constipation.   bismuth subsalicylate 923 RA/07MA suspension Commonly known as: PEPTO BISMOL Take 30 mLs by mouth every 6 (six) hours as needed for indigestion or diarrhea or loose stools.   cyclobenzaprine 10 MG tablet Commonly known as: FLEXERIL Take 10 mg by mouth 2 (two) times daily as needed for muscle spasms.   dextromethorphan 15 MG/5ML syrup Take 10 mLs (30 mg total) by mouth 4 (four) times daily as needed for cough.   ECHINACEA PO Take 1 capsule by mouth daily.   fluticasone 50 MCG/ACT nasal spray Commonly known as: FLONASE Place 1 spray into both nostrils daily. What changed:  when to take this reasons to take this    GAS-X PO Take 2 tablets by mouth every 6 (six) hours as needed (stomach pain).   guaiFENesin 600 MG 12 hr tablet Commonly known as: Mucinex Take 1 tablet (600 mg total) by mouth 2 (two) times daily as needed for to loosen phlegm or cough.   ibuprofen 600 MG tablet Commonly known as: ADVIL Take 600 mg by mouth every 6 (six) hours as needed for moderate pain.   ipratropium 0.06 % nasal spray Commonly known as: ATROVENT Place 2 sprays into both nostrils 4 (four) times daily.   lansoprazole 30 MG capsule Commonly known as: PREVACID Take 30 mg by mouth daily.   lidocaine 5 % Commonly known as: Lidoderm Place 1 patch onto the skin daily. Remove & Discard patch within 12 hours or as directed by MD What changed:  when to take this reasons to take this   Maalox Max 400-400-40 MG/5ML suspension Generic drug: alum & mag hydroxide-simeth Take 10 mLs by mouth every 6 (six) hours as needed for indigestion.   metFORMIN 500 MG 24 hr tablet Commonly known as: GLUCOPHAGE-XR Take 2 tablets (1,000 mg total) by mouth every evening. What changed:  how much to take when to take this   methocarbamol 500 MG tablet Commonly known as: ROBAXIN Take 1 tablet (500 mg total) by mouth every 8 (eight) hours as needed for muscle spasms.   miconazole 2 % vaginal cream Commonly known as: Miconazole 7 Place 1 Applicatorful vaginally at bedtime.   MORINGA OLEIFERA PO Take 1 tablet by mouth daily.   omeprazole 20 MG capsule Commonly known as: PRILOSEC Take 1 capsule (20 mg total) by mouth daily.   oxyCODONE-acetaminophen 5-325 MG tablet Commonly known as: Percocet Take 1-2 tablets by mouth every 4 (four) hours as needed for severe pain or moderate pain.   promethazine 12.5 MG tablet Commonly known as: PHENERGAN Take 1 tablet (12.5 mg total) by mouth every 6 (six) hours as needed for nausea, vomiting or refractory nausea / vomiting.   rosuvastatin 5 MG tablet Commonly known as: Crestor Take 1  tablet (5 mg total) by mouth daily. What changed: when to take this   VITAMIN D3 PO Take 1 tablet by mouth daily.   zinc gluconate 50 MG tablet Take 50 mg by mouth daily.          Signed: Augustin Schooling 05/12/2022, 8:35 AM  [facility] is now [facility]  [place] [address], [place], [place], [place] [address] Phone: [place]

## 2022-05-12 NOTE — Progress Notes (Signed)
Orthopedics Progress Note  Subjective: Patient reports some nausea, better with phenergan which she prefers Pain under control. Block still working partially  Objective:  Vitals:   05/12/22 0214 05/12/22 0407  BP: (!) 147/76 (!) 147/77  Pulse: 83 86  Resp: 16 18  Temp: 97.8 F (36.6 C) (!) 97.5 F (36.4 C)  SpO2: 100% 97%    General: Awake and alert  Musculoskeletal: Left shoulder dressing CDI [place] fingers Neurovascularly intact  Lab Results  Component Value Date   WBC 5.3 04/30/2022   HGB 12.1 05/12/2022   HCT 39.7 05/12/2022   MCV 85.0 04/30/2022   PLT 258 04/30/2022       Component Value Date/Time   NA 139 05/12/2022 0325   NA 141 12/15/2021 1115   K 3.8 05/12/2022 0325   CL 104 05/12/2022 0325   CO2 26 05/12/2022 0325   GLUCOSE 214 (H) 05/12/2022 0325   BUN 14 05/12/2022 0325   BUN 20 12/15/2021 1115   CREATININE 0.74 05/12/2022 0325   CREATININE 0.85 08/23/2014 1623   CALCIUM 8.9 05/12/2022 0325   GFRNONAA >60 05/12/2022 0325   GFRNONAA 78 08/23/2014 1623   GFRAA 84 06/17/2020 1625   GFRAA >89 08/23/2014 1623    Lab Results  Component Value Date   INR 1.0 08/11/2020    Assessment/Plan: POD #1 s/p Procedure(s): REVERSE SHOULDER ARTHROPLASTY Stable this AM Home today after therapy Change Zofran to Phenergan  Remo Lipps R. Veverly Fells, MD 05/12/2022 8:30 AM

## 2022-05-12 NOTE — Progress Notes (Signed)
Occupational Therapy Treatment Patient Details Name: Alyssa Montgomery MRN: [id number] DOB: [birth year] Today's Date: 05/12/2022   History of present illness Patient is a 61 year old woman s/p left reverse shoulder arthroplasty.   OT comments  Husband present second session for reiteration of shoulder precautions, positioning, how to don/doff sling and UB clothing. Patient remembers education from this morning. Encouraged ice packs to assist with pain management. Patient complaining of shoulder pain but is moving well in room. Will continue to follow for patient's comfort.   Recommendations for follow up therapy are one component of a multi-disciplinary discharge planning process, led by the attending physician.  Recommendations may be updated based on patient status, additional functional criteria and insurance authorization.    Follow Up Recommendations  Follow physician's recommendations for discharge plan and follow up therapies     Assistance Recommended at Discharge Intermittent Supervision/Assistance  Patient can return home with the following  A little help with bathing/dressing/bathroom;Assistance with cooking/housework   Equipment Recommendations  BSC/3in1    Recommendations for Other Services      Precautions / Restrictions Precautions Precautions: Shoulder Type of Shoulder Precautions: No AROM, NO PROM Shoulder Interventions: Shoulder sling/immobilizer;Off for dressing/bathing/exercises Precaution Booklet Issued: Yes (comment) Required Braces or Orthoses: Sling Restrictions Weight Bearing Restrictions: Yes LUE Weight Bearing: Non weight bearing       Mobility Bed Mobility               General bed mobility comments: up in chair    Transfers Overall transfer level: Independent                       Balance Overall balance assessment: No apparent balance deficits (not formally assessed)                                          ADL either performed or assessed with clinical judgement   ADL                                         General ADL Comments: Patient's husband present for reiteration of information and instruction on donning/doffing sling and compensatory strategies.    Extremity/Trunk Assessment              Vision       Perception     Praxis      Cognition Arousal/Alertness: Awake/alert Behavior During Therapy: WFL for tasks assessed/performed Overall Cognitive Status: Within Functional Limits for tasks assessed                                          Exercises      Shoulder Instructions Shoulder Instructions Donning/doffing shirt without moving shoulder: Caregiver independent with task Method for sponge bathing under operated UE: Caregiver independent with task Donning/doffing sling/immobilizer: Caregiver independent with task Correct positioning of sling/immobilizer: Caregiver independent with task Sling wearing schedule (on at all times/off for ADL's): Independent Proper positioning of operated UE when showering: Independent Dressing change: Independent Positioning of UE while sleeping: Independent     General Comments      Pertinent Vitals/ Pain       Pain Assessment Pain Assessment: Faces Pain  Score: 7  Faces Pain Scale: Hurts whole lot Pain Location: left shoulder Pain Descriptors / Indicators: Aching, Grimacing Pain Intervention(s): Monitored during session  Home Living                                          Prior Functioning/Environment              Frequency  Min 2X/week        Progress Toward Goals  OT Goals(current goals can now be found in the care plan section)  Progress towards OT goals: Progressing toward goals  Acute Rehab OT Goals Patient Stated Goal: feel more safe/confident OT Goal Formulation: With patient Time For Goal Achievement: 05/19/22 Potential to Achieve Goals: Good   Plan Discharge plan remains appropriate    Co-evaluation                 AM-PAC OT "6 Clicks" Daily Activity     Outcome Measure   Help from another person eating meals?: A Little Help from another person taking care of personal grooming?: A Little Help from another person toileting, which includes using toliet, bedpan, or urinal?: A Little Help from another person bathing (including washing, rinsing, drying)?: A Little Help from another person to put on and taking off regular upper body clothing?: A Lot Help from another person to put on and taking off regular lower body clothing?: A Little 6 Click Score: 17    End of Session    OT Visit Diagnosis: Pain Pain - Right/Left: Left Pain - part of body: Shoulder   Activity Tolerance Patient limited by pain   Patient Left in chair;with family/visitor present   Nurse Communication  (requests second bandage)        Time: 2409-7353 OT Time Calculation (min): 14 min  Charges: OT General Charges $OT Visit: 1 Visit OT Treatments $Self Care/Home Management : 8-22 mins  Gustavo Lah, OTR/L [place]  Office [phone]   Lenward Chancellor 05/12/2022, 2:48 PM

## 2022-05-12 NOTE — Evaluation (Addendum)
Occupational Therapy Evaluation Patient Details Name: Alyssa Montgomery MRN: [id number] DOB: [birth year] Today's Date: 05/12/2022   History of Present Illness Patient is a 61 year old woman s/p left reverse shoulder arthroplasty.   Clinical Impression   Alyssa Montgomery is a 61 year old woman who presents s/p shoulder replacement without functional use of left upper extremity secondary to effects of surgery and interscalene block and shoulder precautions. Therapist provided education and instruction to patient in regards to exercises, precautions, positioning, donning upper extremity clothing and bathing while maintaining shoulder precautions, use of ice packs and donning/doffing sling. Patient verbalized understanding and provided handouts to maximize retention of information. She exhibits anxiety about going home. Patient needed assistance mod assist to don pull up dress and verbalized understanding of UB dressing compensatory strategies. She needed maximize verbal cues, instruction and reiteration to limit shoulder movement during evaluation. She continued to pull her arm up or out and stated "it feels like it needs to be moved." She has assistance of husband today and next week. She is requesting a BSC due to a low toilet and without a grab bar on non-affected side. From an OT standpoint she has been provided with all education. Will f/u acutely to make sure patient maintaining shoulder precautions and retains information.  Patient to follow up with MD for further therapy needs at discharge.       Recommendations for follow up therapy are one component of a multi-disciplinary discharge planning process, led by the attending physician.  Recommendations may be updated based on patient status, additional functional criteria and insurance authorization.   Follow Up Recommendations  Follow physician's recommendations for discharge plan and follow up therapies     Assistance Recommended at Discharge  Intermittent Supervision/Assistance  Patient can return home with the following A little help with bathing/dressing/bathroom;Assistance with cooking/housework    Functional Status Assessment  Patient has had a recent decline in their functional status and demonstrates the ability to make significant improvements in function in a reasonable and predictable amount of time.  Equipment Recommendations  BSC/3in1    Recommendations for Other Services       Precautions / Restrictions Precautions Precautions: Shoulder Type of Shoulder Precautions: No AROM, NO PROM Shoulder Interventions: Shoulder sling/immobilizer;Off for dressing/bathing/exercises Precaution Booklet Issued: Yes (comment) Required Braces or Orthoses: Sling Restrictions Weight Bearing Restrictions: Yes LUE Weight Bearing: Non weight bearing      Mobility Bed Mobility               General bed mobility comments: up in chair    Transfers Overall transfer level: Independent                        Balance Overall balance assessment: No apparent balance deficits (not formally assessed)                                         ADL either performed or assessed with clinical judgement   ADL                                         General ADL Comments: Patient needed assistance to don dress. Patient provided with instruction on how to perform UB dressing using compensatory strategies to maintain ROM restrictions. Patient instructed  on how to don sling standing at sink couner and near [employer].     Vision Baseline Vision/History: 1 Wears glasses Patient Visual Report: No change from baseline       Perception     Praxis      Pertinent Vitals/Pain Pain Assessment Pain Assessment: 0-10 Pain Score: 7  Pain Location: left shoulder Pain Descriptors / Indicators: Aching, Grimacing Pain Intervention(s): Limited activity within patient's tolerance, Monitored during  session     Hand Dominance Right (says she even handed)   Extremity/Trunk Assessment Upper Extremity Assessment Upper Extremity Assessment: LUE deficits/detail LUE Deficits / Details: impaired motor control and sensation secondary to interscalene block   Lower Extremity Assessment Lower Extremity Assessment: Overall WFL for tasks assessed   Cervical / Trunk Assessment Cervical / Trunk Assessment: Normal   Communication Communication Communication: No difficulties   Cognition Arousal/Alertness: Awake/alert Behavior During Therapy: WFL for tasks assessed/performed Overall Cognitive Status: Within Functional Limits for tasks assessed                                       General Comments       Exercises     Shoulder Instructions Shoulder Instructions Donning/doffing shirt without moving shoulder: Patient able to independently direct caregiver Method for sponge bathing under operated UE: Patient able to independently direct caregiver Donning/doffing sling/immobilizer: Minimal assistance;Patient able to independently direct caregiver Correct positioning of sling/immobilizer: Patient able to independently direct caregiver ROM for elbow, wrist and digits of operated UE: Independent Sling wearing schedule (on at all times/off for ADL's): Independent Proper positioning of operated UE when showering: Independent Dressing change: Independent Positioning of UE while sleeping: Young expects to be discharged to:: Private residence Living Arrangements: Spouse/significant other Available Help at Discharge: Family;Available PRN/intermittently Type of Home: House                                  Prior Functioning/Environment Prior Level of Function : Independent/Modified Independent                        OT Problem List: Decreased strength;Decreased range of motion;Impaired UE functional use;Pain;Obesity       OT Treatment/Interventions: Self-care/ADL training;Patient/family education    OT Goals(Current goals can be found in the care plan section) Acute Rehab OT Goals Patient Stated Goal: feel more safe/confident OT Goal Formulation: With patient Time For Goal Achievement: 05/19/22 Potential to Achieve Goals: Good ADL Goals Additional ADL Goal #1: Patient will verbalize understanding of all instruction and education prior to discharge and demonstrate competence.  OT Frequency:      Co-evaluation              AM-PAC OT "6 Clicks" Daily Activity     Outcome Measure Help from another person eating meals?: A Little Help from another person taking care of personal grooming?: A Little Help from another person toileting, which includes using toliet, bedpan, or urinal?: A Little Help from another person bathing (including washing, rinsing, drying)?: A Little Help from another person to put on and taking off regular upper body clothing?: A Lot Help from another person to put on and taking off regular lower body clothing?: A Little 6 Click Score: 17   End of Session Nurse Communication:  (OT education complete)  Activity Tolerance: Patient tolerated treatment well Patient left: in chair                   Time: 4327-6147 OT Time Calculation (min): 31 min Charges:  OT General Charges $OT Visit: 1 Visit OT Treatments $Self Care/Home Management : 8-22 mins  Gustavo Lah, OTR/L [place]  Office [phone]   Lenward Chancellor 05/12/2022, 9:21 AM

## 2022-05-12 NOTE — Plan of Care (Signed)
  Problem: Education: Goal: Knowledge of General Education information will improve Description Including pain rating scale, medication(s)/side effects and non-pharmacologic comfort measures Outcome: Progressing   

## 2022-05-13 NOTE — Anesthesia Postprocedure Evaluation (Signed)
Anesthesia Post Note  Patient: Alyssa Montgomery  Procedure(s) Performed: REVERSE SHOULDER ARTHROPLASTY (Left: Shoulder)     Patient location during evaluation: PACU Anesthesia Type: Regional and General Level of consciousness: awake and alert, oriented and patient cooperative Pain management: pain level controlled Vital Signs Assessment: post-procedure vital signs reviewed and stable Respiratory status: spontaneous breathing, nonlabored ventilation and respiratory function stable Cardiovascular status: blood pressure returned to baseline and stable Postop Assessment: no apparent nausea or vomiting Anesthetic complications: no   No notable events documented.  Last Vitals:  Vitals:   05/12/22 0407 05/12/22 1015  BP: (!) 147/77 (!) 148/76  Pulse: 86 82  Resp: 18 18  Temp: (!) 36.4 C 36.9 C  SpO2: 97% 99%    Last Pain:  Vitals:   05/12/22 1515  TempSrc:   PainSc: [place]

## 2022-05-14 ENCOUNTER — Telehealth: Payer: Self-pay

## 2022-05-14 ENCOUNTER — Encounter (HOSPITAL_COMMUNITY): Payer: Self-pay | Admitting: Orthopedic Surgery

## 2022-05-14 NOTE — Patient Outreach (Signed)
  Care Coordination TOC Note Transition Care Management Follow-up Telephone Call Date of discharge and from where: [facility] 05/11/22-05/12/22 How have you been since you were released from the hospital? "I am in a lot of pain and my hand is very swollen. Any questions or concerns? Yes-Patient was suppose to have therapy and a home health aide to assist her with bathing and dressing and now they are not coming to see her.  Patient is unable to participate in therapy at this time so she does not have a skilled need so the aide is not coming.  This Probation officer called [employer] to see if patients policy may cover [facility] assistance.  Per MJ at [facility], Ms Mast has Medicaid and they will cover private care such as [facility].  Reached out to [facility] and was told they are working to see if they can see the patient as she is an Glass blower/designer at [employer].   Items Reviewed: Did the pt receive and understand the discharge instructions provided? Yes  Medications obtained and verified? Yes  Other? No  Any new allergies since your discharge? No  Dietary orders reviewed? Yes Do you have support at home?  Some help from spouse  Home Care and Equipment/Supplies: Were home health services ordered? yes If so, what is the name of the agency? [facility]  Has the agency set up a time to come to the patient's home? [facility] canceled services since patient does not have a skilled need. Were any new equipment or medical supplies ordered?  Yes: 3 in 1 commode What is the name of the medical supply agency? [employer] Were you able to get the supplies/equipment? yes Do you have any questions related to the use of the equipment or supplies? No  Functional Questionnaire: (I = Independent and D = Dependent) ADLs: Needs assistance  Bathing/Dressing- Needs assistance  Meal Prep- D  Eating- I  Maintaining continence- I  Transferring/Ambulation- needs assistance  Managing Meds- I  Follow up appointments  reviewed:  PCP Hospital f/u appt confirmed? No   Specialist Hospital f/u appt confirmed? Yes  Scheduled to see Dr. Veverly Fells on 05/26/22. Are transportation arrangements needed? No  If their condition worsens, is the pt aware to call PCP or go to the Emergency Dept.? Yes Was the patient provided with contact information for the PCP's office or ED? Yes Was to pt encouraged to call back with questions or concerns? Yes  SDOH assessments and interventions completed:   Yes SDOH Interventions Today    Flowsheet Row Most Recent Value  SDOH Interventions   Housing Interventions Intervention Not Indicated  Financial Strain Interventions Intervention Not Indicated       Care Coordination Interventions:  Pending return call from [facility] to see if they can see patient    Encounter Outcome:  Pt. Visit Completed

## 2022-05-29 DIAGNOSIS — Z4789 Encounter for other orthopedic aftercare: Secondary | ICD-10-CM | POA: Diagnosis not present

## 2022-06-26 DIAGNOSIS — Z4789 Encounter for other orthopedic aftercare: Secondary | ICD-10-CM | POA: Diagnosis not present

## 2022-08-06 ENCOUNTER — Encounter: Payer: Self-pay | Admitting: Student

## 2022-08-06 ENCOUNTER — Ambulatory Visit (INDEPENDENT_AMBULATORY_CARE_PROVIDER_SITE_OTHER): Payer: 59 | Admitting: Student

## 2022-08-06 VITALS — BP 130/64 | HR 70 | Ht 64.0 in | Wt 236.0 lb

## 2022-08-06 DIAGNOSIS — R300 Dysuria: Secondary | ICD-10-CM

## 2022-08-06 DIAGNOSIS — Z131 Encounter for screening for diabetes mellitus: Secondary | ICD-10-CM

## 2022-08-06 DIAGNOSIS — R350 Frequency of micturition: Secondary | ICD-10-CM

## 2022-08-06 LAB — POCT URINALYSIS DIP (MANUAL ENTRY)
Bilirubin, UA: NEGATIVE
Blood, UA: NEGATIVE
Glucose, UA: NEGATIVE mg/dL
Ketones, POC UA: NEGATIVE mg/dL
Leukocytes, UA: NEGATIVE
Nitrite, UA: NEGATIVE
Protein Ur, POC: NEGATIVE mg/dL
Spec Grav, UA: 1.025 (ref 1.010–1.025)
Urobilinogen, UA: 1 E.U./dL
pH, UA: 6 (ref 5.0–8.0)

## 2022-08-06 LAB — POCT GLYCOSYLATED HEMOGLOBIN (HGB A1C): HbA1c, POC (controlled diabetic range): 7.1 % — AB (ref 0.0–7.0)

## 2022-08-06 NOTE — Progress Notes (Unsigned)
    SUBJECTIVE:   CHIEF COMPLAINT / HPI:   Alyssa Montgomery is a 62 year-old female here for frequency with urination and pain in lower abdomen since yesterday.  This feels like a typical UTI for her.  No fever or chills. She is wondering if the sexual intercourse is provoking her UTIs which have been more frequent since menopause.  She is post-menopausal.   PERTINENT  PMH / PSH: Reviewed  OBJECTIVE:   BP 130/64   Pulse 70   Ht 5' 4"$  (1.626 m)   Wt 236 lb (107 kg)   BMI 40.51 kg/m   General: Alert and cooperative and appears to be in no acute distress Cardio: Normal S1 and S2, no S3 or S4. Rhythm is regular. No murmurs or rubs.   Pulm: Clear to auscultation bilaterally, no crackles, wheezing, or diminished breath sounds. Normal respiratory effort Abdomen: Bowel sounds normal. Abdomen soft and non-tender.  Extremities: No peripheral edema. Warm/ well perfused.  Strong radial pulses. Neuro: Cranial nerves grossly intact  ASSESSMENT/PLAN:   No problem-specific Assessment & Plan notes found for this encounter.    Orvis Brill, [place]    {    This will disappear when note is signed, click to select method of visit    :1}

## 2022-08-06 NOTE — Patient Instructions (Addendum)
It was great seeing you today.  As we discussed, Use plenty of water-based lubricant with sexual intercourse. I will call you if your urine culture shows bacteria that needs an antibiotic  If you have any questions or concerns, please feel free to call the clinic.   Have a wonderful day,  Dr. Orvis Brill [facility] [phone]

## 2022-08-07 DIAGNOSIS — Z4789 Encounter for other orthopedic aftercare: Secondary | ICD-10-CM | POA: Diagnosis not present

## 2022-08-07 DIAGNOSIS — R300 Dysuria: Secondary | ICD-10-CM | POA: Insufficient documentation

## 2022-08-07 NOTE — Assessment & Plan Note (Addendum)
62 year old female with 1 day of urinary frequency and suprapubic tenderness. Urinalysis dipstick negative for infection today with negative leukocytes and nitrites.  I did send for urine culture. -Follow-up urine culture -Reviewed signs and symptoms to look out for for worsening infection including fever, flank pain, generally feeling unwell -We also discussed vaginal atrophy and dryness as a provoking cause for her urinary tract infections and menopausal and postmenopausal women.  I did offer for vaginal estrogen cream today, which patient declined for now.  I encouraged her to use plenty of water-based lubricant with sexual intercourse

## 2022-08-20 ENCOUNTER — Other Ambulatory Visit: Payer: Self-pay | Admitting: Family Medicine

## 2022-08-20 DIAGNOSIS — E785 Hyperlipidemia, unspecified: Secondary | ICD-10-CM

## 2022-09-16 ENCOUNTER — Other Ambulatory Visit: Payer: Self-pay | Admitting: Family Medicine

## 2022-09-16 DIAGNOSIS — J3089 Other allergic rhinitis: Secondary | ICD-10-CM

## 2022-10-04 ENCOUNTER — Emergency Department (HOSPITAL_COMMUNITY)
Admission: EM | Admit: 2022-10-04 | Discharge: 2022-10-04 | Disposition: A | Discharge status: Home / Self Care | Payer: 59 | Attending: Emergency Medicine | Admitting: Emergency Medicine

## 2022-10-04 ENCOUNTER — Other Ambulatory Visit: Payer: Self-pay

## 2022-10-04 ENCOUNTER — Emergency Department (HOSPITAL_COMMUNITY): Payer: 59

## 2022-10-04 ENCOUNTER — Encounter (HOSPITAL_COMMUNITY): Payer: Self-pay

## 2022-10-04 DIAGNOSIS — Z87891 Personal history of nicotine dependence: Secondary | ICD-10-CM | POA: Diagnosis not present

## 2022-10-04 DIAGNOSIS — Z7984 Long term (current) use of oral hypoglycemic drugs: Secondary | ICD-10-CM | POA: Diagnosis not present

## 2022-10-04 DIAGNOSIS — R059 Cough, unspecified: Secondary | ICD-10-CM | POA: Diagnosis present

## 2022-10-04 DIAGNOSIS — J189 Pneumonia, unspecified organism: Secondary | ICD-10-CM | POA: Diagnosis not present

## 2022-10-04 MED ORDER — LEVOFLOXACIN 750 MG PO TABS: 750.0000 mg | ORAL_TABLET | Freq: Every day | ORAL | 0 refills | Status: DC

## 2022-10-04 MED ORDER — BENZONATATE 100 MG PO CAPS: 100.0000 mg | ORAL_CAPSULE | Freq: Three times a day (TID) | ORAL | 0 refills | Status: DC | PRN

## 2022-10-04 NOTE — ED Triage Notes (Signed)
Patient has had a productive cough for 3 months. Coughing up thick tan mucus. Quit smoking 4 years ago.

## 2022-10-04 NOTE — ED Provider Notes (Signed)
[place] EMERGENCY DEPARTMENT AT [facility] Provider Note   CSN: [id number] Arrival date & time: 10/04/22  1110     History  Chief Complaint  Patient presents with   Cough    Alyssa Montgomery is a 62 y.o. female.  The history is provided by the patient and medical records. No language interpreter was used.  Cough    62 year old female with multiple comorbidities which includes fibromyalgia's, GERD, recurrent UTI, allergic rhinitis, anxiety who presenting with complaints of cough.  Patient report for the past 3 months she has had a recurrent cough.  States it comes and goes sometimes lasting for several weeks and resolved for few weeks but has returned for the past month.  She is coughing up productive sputum, complaining of pain in her chest with persistent coughing not improved with over-the-counter medication.  She does not endorse any fever or chills no nausea vomiting diarrhea no shortness of breath no hemoptysis.  Denies any recent sick contact.  States that she quit smoking approximately 3 years ago.  No report of fever weight change or night sweats.  Home Medications Prior to Admission medications   Medication Sig Start Date End Date Taking? Authorizing Provider  albuterol (VENTOLIN HFA) 108 (90 Base) MCG/ACT inhaler INHALE 2 PUFFS INTO THE LUNGS EVERY 6 HOURS AS NEEDED FOR WHEEZING/SHORTNESS OF BREATH 09/17/22   Fayette Pho, MD  alum & mag hydroxide-simeth (MAALOX MAX) 400-400-40 MG/5ML suspension Take 10 mLs by mouth every 6 (six) hours as needed for indigestion. Patient not taking: Reported on 03/15/2022 08/16/21   Horton, Kristie M, DO  benzonatate (TESSALON) 100 MG capsule Take 1 capsule (100 mg total) by mouth 3 (three) times daily as needed for cough. Patient not taking: Reported on 03/15/2022 02/26/22   Gloris Manchester, MD  bisacodyl (DULCOLAX) 5 MG EC tablet Take 5 mg by mouth daily as needed for moderate constipation.    [provider]  bismuth  subsalicylate (PEPTO BISMOL) 262 MG/15ML suspension Take 30 mLs by mouth every 6 (six) hours as needed for indigestion or diarrhea or loose stools.    [provider]  Cholecalciferol (VITAMIN D3 PO) Take 1 tablet by mouth daily.    [provider]  cyclobenzaprine (FLEXERIL) 10 MG tablet Take 10 mg by mouth 2 (two) times daily as needed for muscle spasms. 02/08/22   [provider]  dextromethorphan 15 MG/5ML syrup Take 10 mLs (30 mg total) by mouth 4 (four) times daily as needed for cough. 06/22/21   Fayette Pho, MD  ECHINACEA PO Take 1 capsule by mouth daily.    [provider]  fluticasone (FLONASE) 50 MCG/ACT nasal spray Place 1 spray into both nostrils daily. Patient taking differently: Place 1 spray into both nostrils daily as needed for allergies. 03/15/22   Fayette Pho, MD  guaiFENesin (MUCINEX) 600 MG 12 hr tablet Take 1 tablet (600 mg total) by mouth 2 (two) times daily as needed for to loosen phlegm or cough. Patient not taking: Reported on 04/25/2022 05/31/21   Fayette Pho, MD  ibuprofen (ADVIL) 600 MG tablet Take 600 mg by mouth every 6 (six) hours as needed for moderate pain.    [provider]  ipratropium (ATROVENT) 0.06 % nasal spray Place 2 sprays into both nostrils 4 (four) times daily. Patient not taking: Reported on 04/25/2022 06/22/21   Fayette Pho, MD  lansoprazole (PREVACID) 30 MG capsule Take 30 mg by mouth daily.    [provider]  lidocaine (  LIDODERM) 5 % Place 1 patch onto the skin daily. Remove & Discard patch within 12 hours or as directed by MD Patient taking differently: Place 1 patch onto the skin daily as needed (pain). Remove & Discard patch within 12 hours or as directed by MD 03/15/22   Fayette Pho, MD  metFORMIN (GLUCOPHAGE-XR) 500 MG 24 hr tablet Take 2 tablets (1,000 mg total) by mouth every evening. Patient taking differently: Take 250 mg by mouth at bedtime. 11/16/21   Autry-Lott, Randa Evens, DO   methocarbamol (ROBAXIN) 500 MG tablet Take 1 tablet (500 mg total) by mouth every 8 (eight) hours as needed for muscle spasms. 05/11/22   Beverely Low, MD  miconazole (MICONAZOLE 7) 2 % vaginal cream Place 1 Applicatorful vaginally at bedtime. 04/04/22   Billey Co, MD  MORINGA OLEIFERA PO Take 1 tablet by mouth daily.    [provider]  omeprazole (PRILOSEC) 20 MG capsule Take 1 capsule (20 mg total) by mouth daily. Patient not taking: Reported on 04/25/2022 08/16/21   Horton, Clabe Seal, DO  oxyCODONE-acetaminophen (PERCOCET) 5-325 MG tablet Take 1-2 tablets by mouth every 4 (four) hours as needed for severe pain or moderate pain. 05/11/22 05/11/23  Beverely Low, MD  promethazine (PHENERGAN) 12.5 MG tablet Take 1 tablet (12.5 mg total) by mouth every 6 (six) hours as needed for nausea, vomiting or refractory nausea / vomiting. 05/12/22   Beverely Low, MD  rosuvastatin (CRESTOR) 5 MG tablet Take 1 tablet (5 mg total) by mouth daily. Patient taking differently: Take 5 mg by mouth at bedtime. 12/15/21   Littie Deeds, MD  Simethicone (GAS-X PO) Take 2 tablets by mouth every 6 (six) hours as needed (stomach pain).    [provider]  zinc gluconate 50 MG tablet Take 50 mg by mouth daily.    [provider]      Allergies    Zanaflex [tizanidine hcl], Penicillins, and Sulfonamide derivatives    Review of Systems   Review of Systems  Respiratory:  Positive for cough.   All other systems reviewed and are negative.   Physical Exam Updated Vital Signs BP (!) 120/90 (BP Location: Left Arm)   Pulse 77   Temp 97.8 F (36.6 C) (Oral)   Resp 16   Ht  (1.626 m)   Wt 108 kg   SpO2 95%   BMI 40.87 kg/m  Physical Exam Vitals and nursing note reviewed.  Constitutional:      General: She is not in acute distress.    Appearance: She is well-developed.  HENT:     Head: Atraumatic.  Eyes:     Conjunctiva/sclera: Conjunctivae normal.  Cardiovascular:     Rate  and Rhythm: Normal rate and regular rhythm.     Pulses: Normal pulses.     Heart sounds: Normal heart sounds.  Pulmonary:     Effort: Pulmonary effort is normal.     Breath sounds: No wheezing, rhonchi or rales.  Abdominal:     Palpations: Abdomen is soft.     Tenderness: There is no abdominal tenderness.  Musculoskeletal:     Cervical back: Neck supple.     Right lower leg: No edema.     Left lower leg: No edema.  Skin:    Findings: No rash.  Neurological:     Mental Status: She is alert.  Psychiatric:        Mood and Affect: Mood normal.     ED Results / Procedures / Treatments  Labs (all labs ordered are listed, but only abnormal results are displayed) Labs Reviewed - No data to display  EKG None  Radiology DG Chest 2 View  Result Date: 10/04/2022 CLINICAL DATA:  Cough EXAM: CHEST - 2 VIEW COMPARISON:  CXR 02/26/22 FINDINGS: No pleural effusion. No pneumothorax. Normal cardiac and mediastinal contours. No radiographically apparent displaced rib fractures. Visualized upper abdomen is unremarkable. Left shoulder arthroplasty. There are hazy opacities at the bilateral lung bases, which could represent atelectasis or infection. IMPRESSION: Hazy opacities at the bilateral lung bases, which could represent atelectasis or infection. Electronically Signed   By: Lorenza Cambridge M.D.   On: 10/04/2022 12:27    Procedures Procedures    Medications Ordered in ED Medications - No data to display  ED Course/ Medical Decision Making/ A&P                             Medical Decision Making Amount and/or Complexity of Data Reviewed Radiology: ordered.   BP (!) 120/90 (BP Location: Left Arm)   Pulse 77   Temp 97.8 F (36.6 C) (Oral)   Resp 16   Ht  (1.626 m)   Wt 108 kg   SpO2 95%   BMI 40.87 kg/m   63:44 PM   62 year old female with multiple comorbidities which includes fibromyalgia's, GERD, recurrent UTI, allergic rhinitis, anxiety who presenting with complaints  of cough.  Patient report for the past 3 months she has had a recurrent cough.  States it comes and goes sometimes lasting for several weeks and resolved for few weeks but has returned for the past month.  She is coughing up productive sputum, complaining of pain in her chest with persistent coughing not improved with over-the-counter medication.  She does not endorse any fever or chills no nausea vomiting diarrhea no shortness of breath no hemoptysis.  Denies any recent sick contact.  States that she quit smoking approximately 3 years ago.  No report of fever weight change or night sweats.  On exam patient is resting comfortably in the chair appears to be in no acute discomfort.  She does have occasional cough during exam.  Throat exam unremarkable, heart with normal rate and rhythm, lungs clear to auscultation bilaterally without wheezes rales or rhonchi abdomen is soft nontender no peripheral edema appreciated.  Vitals are reviewed and overall reassuring no fever no hypoxia.  Chest x-ray obtained intimately viewed interpreted by me and agree with radiologist interpretation.  X-ray did shows hazy opacities to bilateral lung bases concerning for atelectasis or infection.  Since patient has tried numerous over-the-counter medication and her symptoms do persist, plan to prescribe antibiotic which include Levaquin to treat for pneumonia.  Cough medication prescribed as well.        Final Clinical Impression(s) / ED Diagnoses Final diagnoses:  Community acquired pneumonia, unspecified laterality    Rx / DC Orders ED Discharge Orders          Ordered    levofloxacin (LEVAQUIN) 750 MG tablet  Daily        10/04/22 1318    benzonatate (TESSALON) 100 MG capsule  3 times daily PRN        10/04/22 1318              Fayrene Helper, PA-C 10/04/22 1319    Jacalyn Lefevre, MD 10/04/22 1442

## 2022-10-04 NOTE — Discharge Instructions (Signed)
You have been diagnosed with pneumonia.  Please take antibiotic prescribed.  Take Tessalon as needed for cough.  Follow-up closely with your doctor for recheck.  Return if you have any concern.

## 2022-10-16 LAB — HM DIABETES EYE EXAM

## 2022-10-18 ENCOUNTER — Telehealth: Payer: Self-pay | Admitting: Family Medicine

## 2022-10-18 NOTE — Telephone Encounter (Signed)
Called patient to schedule Medicare Annual Wellness Visit (AWV). Left message for patient to call back and schedule Medicare Annual Wellness Visit (AWV).  Last date of AWV:  10/06/2021   Please schedule an AWVS appointment at any time with [facility] VISIT.  If any questions, please contact me at [phone].    Thank you,  [facility] Support [facility] Direct dial  [phone]

## 2022-11-07 ENCOUNTER — Ambulatory Visit (INDEPENDENT_AMBULATORY_CARE_PROVIDER_SITE_OTHER): Payer: 59 | Admitting: Family Medicine

## 2022-11-07 VITALS — BP 154/95 | HR 74 | Ht 64.0 in | Wt 232.0 lb

## 2022-11-07 DIAGNOSIS — R051 Acute cough: Secondary | ICD-10-CM | POA: Diagnosis not present

## 2022-11-07 DIAGNOSIS — M75121 Complete rotator cuff tear or rupture of right shoulder, not specified as traumatic: Secondary | ICD-10-CM | POA: Diagnosis not present

## 2022-11-07 DIAGNOSIS — M549 Dorsalgia, unspecified: Secondary | ICD-10-CM | POA: Diagnosis not present

## 2022-11-07 DIAGNOSIS — M25511 Pain in right shoulder: Secondary | ICD-10-CM | POA: Diagnosis not present

## 2022-11-07 DIAGNOSIS — R059 Cough, unspecified: Secondary | ICD-10-CM | POA: Insufficient documentation

## 2022-11-07 LAB — POCT URINALYSIS DIP (MANUAL ENTRY)
Bilirubin, UA: NEGATIVE
Blood, UA: NEGATIVE
Glucose, UA: NEGATIVE mg/dL
Ketones, POC UA: NEGATIVE mg/dL
Leukocytes, UA: NEGATIVE
Nitrite, UA: NEGATIVE
Protein Ur, POC: NEGATIVE mg/dL
Spec Grav, UA: 1.025 (ref 1.010–1.025)
Urobilinogen, UA: 1 E.U./dL
pH, UA: 5.5 (ref 5.0–8.0)

## 2022-11-07 MED ORDER — BENZONATATE 100 MG PO CAPS: 100.0000 mg | ORAL_CAPSULE | Freq: Three times a day (TID) | ORAL | 0 refills | Status: DC | PRN

## 2022-11-07 MED ORDER — CYCLOBENZAPRINE HCL 10 MG PO TABS: 10.0000 mg | ORAL_TABLET | Freq: Two times a day (BID) | ORAL | 0 refills | Status: DC | PRN

## 2022-11-07 MED ORDER — LIDOCAINE 5 % EX PTCH
1.0000 | MEDICATED_PATCH | Freq: Every day | CUTANEOUS | 1 refills | Status: DC | PRN
Start: 2022-11-07 — End: 2023-09-20

## 2022-11-07 NOTE — Assessment & Plan Note (Signed)
Consistent with musculoskeletal pain likely exacerbated by coughing.  No signs of fracture or UTI or stone.  Treat with lidocaine patch and muscle relaxer which has worked in the past

## 2022-11-07 NOTE — Assessment & Plan Note (Signed)
Recurrent after improved with CAP treatment.  Seems most consistent with a viral URI with PND.  No sxs of bacterial sinusitis or recurrent pneumonia.    Will treat as such and monitor.  Of note likely needs lung cancer screening due to smoking history

## 2022-11-07 NOTE — Patient Instructions (Addendum)
Good to see you today - Thank you for coming in  Things we discussed today:  Cough Use tessalon perles as needed Use nasal saline 2 sprays at least four times a day  Back pain I think is muscle strain Lidocaine patch and flexaril as needed  Take naproxen as needed   Make an appointment to see Dr Larita Fife for the diabetes and blood pressure and weight and lung cancer screen  Bring in all your medications

## 2022-11-07 NOTE — Progress Notes (Signed)
SUBJECTIVE:   CHIEF COMPLAINT / HPI:   Cough Had CAP on 4/25 in ER X-ray showed hazy opacities to bilateral lung bases concerning for atelectasis or infection. treated with levaquin and tessalon.  Her cough markedly improved but returned about a week ago with feeling of thick phlegm in back of her throat.  No fever or chest pain or shortness of breath.  Works in health care so numerous exposures  Back pain Associated with chronic shoulder pain.  Has had back pain on and off.  Currently is diffusely over low back.  No injury or any urinary symptoms but is concerned for uti.  No weakness in legs or incontinence.  Taking naprosyn as needed   PERTINENT  PMH / PSH: DJD, diabetes, GERD    OBJECTIVE:   BP (!) 154/95 (BP Location: Left Arm, Patient Position: Sitting, Cuff Size: Normal)   Pulse 74   Ht 5\' 4"  (1.626 m)   Wt 232 lb (105.2 kg)   SpO2 100%   BMI 39.82 kg/m   No distress Lungs:  Normal respiratory effort, chest expands symmetrically. Lungs are clear to auscultation, no crackles or wheezes. Frequent dry cough  Neck:  No deformities, thyromegaly, masses, or tenderness noted.   Supple with full range of motion without pain. Throat: normal mucosa, no exudate, uvula midline, no redness Neurologic exam : Able to walk on heels and toes.   Able to touch toes Mobility:able to get up and down from exam table without assistance or distress Back - mildly diffusely tender around LS paraspinous muscles  No verterbral tenderness  ASSESSMENT/PLAN:   Other acute back pain Assessment & Plan: Consistent with musculoskeletal pain likely exacerbated by coughing.  No signs of fracture or UTI or stone.  Treat with lidocaine patch and muscle relaxer which has worked in the past   Orders: -     POCT urinalysis dipstick  Pain, joint, shoulder, right -     Lidocaine; Place 1 patch onto the skin daily as needed (pain). Remove & Discard patch within 12 hours or as directed by MD  Dispense:  30 patch; Refill: 1  Nontraumatic complete tear of right rotator cuff -     Lidocaine; Place 1 patch onto the skin daily as needed (pain). Remove & Discard patch within 12 hours or as directed by MD  Dispense: 30 patch; Refill: 1  Acute cough Assessment & Plan: Recurrent after improved with CAP treatment.  Seems most consistent with a viral URI with PND.  No sxs of bacterial sinusitis or recurrent pneumonia.    Will treat as such and monitor.  Of note likely needs lung cancer screening due to smoking history   Other orders -     Cyclobenzaprine HCl; Take 1 tablet (10 mg total) by mouth 2 (two) times daily as needed for muscle spasms.  Dispense: 20 tablet; Refill: 0 -     Benzonatate; Take 1 capsule (100 mg total) by mouth 3 (three) times daily as needed for cough.  Dispense: 21 capsule; Refill: 0     Patient Instructions  Good to see you today - Thank you for coming in  Things we discussed today:  Cough Use tessalon perles as needed Use nasal saline 2 sprays at least four times a day  Back pain I think is muscle strain Lidocaine patch and flexaril as needed  Take naproxen as needed   Make an appointment to see Dr Larita Fife for the diabetes and blood pressure and weight and  lung cancer screen  Bring in all your medications    Carney Living, MD [facility]

## 2022-11-12 NOTE — Patient Instructions (Signed)
It was wonderful to see you today. Thank you for allowing me to be a part of your care. Below is a short summary of what we discussed at your visit today:  Lung cancer screening I have ordered this for you.  Once it is approved by insurance, we will call you to schedule.   Blood work Today we will check your thyroid, vitamin B12, folate, and red blood cell count. If the results are normal, I will send you a letter or MyChart message. If the results are abnormal, I will give you a call.    Shingles Vaccine I have written a prescription for the shingles vaccine.  Please take this to your pharmacy to have this administered. Your insurance prefers you get this specific vaccine at the pharmacy instead of in clinic.   Medicare Annual Wellness Exam Your chart indicates that you are due for your Medicare annual wellness exam.  Your insurance likes [place] to do one of these every year.  This is a nurse only visit that takes about 30 minutes to an hour.  It can be done either in person or virtually.  This is an in-depth visit that focuses on preventative care and keeping you healthy.  Somebody from our clinic will be calling you soon to get this scheduled.  Primary care doctor graduating soon! I am graduating June 28th. You will be assigned a new PCP.  Thank you for letting me care for you! It has been wonderful getting to know you.   Please bring all of your medications to every appointment!  If you have any questions or concerns, please do not hesitate to contact us via phone or MyChart message.   Fayette Pho, MD

## 2022-11-12 NOTE — Progress Notes (Unsigned)
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, [employer] Denies headache, vision change, weakness Not currently on any antihypertensives, not formally diagnosed with HTN  BP Readings from Last 3 Encounters:  11/13/22 136/75  11/07/22 (!) 154/95  10/04/22 (!) 154/105    T2DM Patient concerned about her blood sugar and would like her annual and see Last A1c 08/06/2022 was 7.1 Current medications include metformin, prescribed as 1000 mg mg 24-hour tablet nightly  Lab Results  Component Value Date   HGBA1C 7.9 (H) 11/13/2022   HGBA1C 7.1 (A) 08/06/2022   HGBA1C 6.9 (H) 04/30/2022   Lab Results  Component Value Date   MICROALBUR 30 07/13/2019   LDLCALC 106 (H) 10/26/2021   CREATININE 0.74 05/12/2022   Lung cancer screening Patient request lung cancer screening History of smoking, although did stop during the pandemic a couple of years ago Smoked for 40 to 50 years, 1/2 PPD on average 20-25 pack-years total She is concerned because her father died from lung cancer Has recurrent cough, never before been evaluated by PFTs  PERTINENT  PMH / PSH:  Patient Active Problem List   Diagnosis Date Noted   BMI 40.0-44.9, adult (HCC) 04/27/2021    Priority: High   Need for shingles vaccine 11/14/2022   Screening for lung cancer 11/14/2022   Cough 11/07/2022   H/O total shoulder replacement, 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   Diverticulosis 07/06/2021   Morbid obesity (HCC) 04/27/2021   Peripheral polyneuropathy 05/27/2020   Neuropathy 02/15/2020    Bilateral primary osteoarthritis of hip 02/10/2020   Hyperlipidemia associated with type 2 diabetes mellitus (HCC) 11/14/2017   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   Other fatigue 08/10/2016   Diabetes mellitus without complication (HCC) 04/17/2015   History of tobacco use 04/17/2015   Complete tear of right rotator cuff 11/03/2014   Back pain 06/19/2012   GERD 12/26/2006    OBJECTIVE:   BP 136/75   Pulse 72   Ht 5\' 4"  (1.626 m)   Wt 233 lb 6.4 oz (105.9 kg)   SpO2 100%   BMI 40.06 kg/m     PHQ-9:     11/13/2022    4:02 PM 11/07/2022    4:03 PM 08/06/2022    4:08 PM  Depression screen PHQ 2/9  Decreased Interest 0 0 0  Down, Depressed, Hopeless 1 1 1   PHQ - 2 Score 1 1 1   Altered sleeping 1  0  Tired, decreased energy 1  1  Change in appetite 0  0  Feeling bad or failure about yourself  0  1  Trouble concentrating 0  0  Moving slowly or fidgety/restless 0  0  Suicidal thoughts 0  0  PHQ-9 Score 3  3  Difficult doing work/chores   Somewhat difficult    Physical Exam General: Awake, alert, oriented Cardiovascular: Regular rate and rhythm, S1 and S2 present, no murmurs auscultated Respiratory: Lung fields clear to auscultation bilaterally  ASSESSMENT/PLAN:   Diabetes mellitus without complication (HCC) A1c and urine microalbumin today.  - continue metformin - patient was prescribed Mounjaro 2.5 mg weekly (unsure  from whom) but is hesitant to start - take Mounjaro weekly x 4 weeks then follow up with pharmacy team   Screening for lung cancer Meets criteria.  20 to 25-pack-year history, correct age range, stop smoking 2 years ago.  Ordered low-dose CT chest.  Need for shingles vaccine Per discrete provided to be completed at her convenience.  Other fatigue Likely due to external life stressors, however will add on TSH, B12, and folate to blood work today.     Fayette Pho, MD [facility]

## 2022-11-13 ENCOUNTER — Encounter: Payer: Self-pay | Admitting: Family Medicine

## 2022-11-13 ENCOUNTER — Ambulatory Visit (INDEPENDENT_AMBULATORY_CARE_PROVIDER_SITE_OTHER): Payer: 59 | Admitting: Family Medicine

## 2022-11-13 VITALS — BP 136/75 | HR 72 | Ht 64.0 in | Wt 233.4 lb

## 2022-11-13 DIAGNOSIS — R7989 Other specified abnormal findings of blood chemistry: Secondary | ICD-10-CM

## 2022-11-13 DIAGNOSIS — Z122 Encounter for screening for malignant neoplasm of respiratory organs: Secondary | ICD-10-CM

## 2022-11-13 DIAGNOSIS — Z23 Encounter for immunization: Secondary | ICD-10-CM

## 2022-11-13 DIAGNOSIS — R5383 Other fatigue: Secondary | ICD-10-CM

## 2022-11-13 DIAGNOSIS — E119 Type 2 diabetes mellitus without complications: Secondary | ICD-10-CM | POA: Diagnosis not present

## 2022-11-13 DIAGNOSIS — R350 Frequency of micturition: Secondary | ICD-10-CM

## 2022-11-13 MED ORDER — ZOSTER VAC RECOMB ADJUVANTED 50 MCG/0.5ML IM SUSR
0.5000 mL | Freq: Once | INTRAMUSCULAR | 1 refills | Status: AC
Start: 2022-11-13 — End: 2022-11-13

## 2022-11-13 MED ORDER — LANSOPRAZOLE 30 MG PO CPDR: 30.0000 mg | DELAYED_RELEASE_CAPSULE | Freq: Every day | ORAL | 3 refills | Status: DC

## 2022-11-14 ENCOUNTER — Encounter: Payer: Self-pay | Admitting: Family Medicine

## 2022-11-14 DIAGNOSIS — Z122 Encounter for screening for malignant neoplasm of respiratory organs: Secondary | ICD-10-CM | POA: Insufficient documentation

## 2022-11-14 DIAGNOSIS — Z23 Encounter for immunization: Secondary | ICD-10-CM | POA: Insufficient documentation

## 2022-11-14 LAB — MICROALBUMIN / CREATININE URINE RATIO
Creatinine, Urine: 168 mg/dL
Microalb/Creat Ratio: 13 mg/g creat (ref 0–29)
Microalbumin, Urine: 22 ug/mL

## 2022-11-14 LAB — CBC
Hematocrit: 37.5 % (ref 34.0–46.6)
Hemoglobin: 11.5 g/dL (ref 11.1–15.9)
MCH: 24.4 pg — ABNORMAL LOW (ref 26.6–33.0)
MCHC: 30.7 g/dL — ABNORMAL LOW (ref 31.5–35.7)
MCV: 80 fL (ref 79–97)
Platelets: 249 10*3/uL (ref 150–450)
RBC: 4.71 x10E6/uL (ref 3.77–5.28)
RDW: 14.5 % (ref 11.7–15.4)
WBC: 5.1 10*3/uL (ref 3.4–10.8)

## 2022-11-14 LAB — HEMOGLOBIN A1C
Est. average glucose Bld gHb Est-mCnc: 180 mg/dL
Hgb A1c MFr Bld: 7.9 % — ABNORMAL HIGH (ref 4.8–5.6)

## 2022-11-14 LAB — FOLATE: Folate: 9.8 ng/mL (ref >3.0)

## 2022-11-14 LAB — VITAMIN B12: Vitamin B-12: 746 pg/mL (ref 232–1245)

## 2022-11-14 LAB — TSH RFX ON ABNORMAL TO FREE T4: TSH: 0.964 u[IU]/mL (ref 0.450–4.500)

## 2022-11-14 NOTE — Assessment & Plan Note (Signed)
Meets criteria.  20 to 25-pack-year history, correct age range, stop smoking 2 years ago.  Ordered low-dose CT chest.

## 2022-11-14 NOTE — Assessment & Plan Note (Addendum)
A1c and urine microalbumin today.  - continue metformin - patient was prescribed Mounjaro 2.5 mg weekly (unsure from whom) but is hesitant to start - take Mounjaro weekly x 4 weeks then follow up with pharmacy team

## 2022-11-14 NOTE — Assessment & Plan Note (Signed)
Likely due to external life stressors, however will add on TSH, B12, and folate to blood work today.

## 2022-11-14 NOTE — Assessment & Plan Note (Signed)
Per discrete provided to be completed at her convenience.

## 2022-11-20 ENCOUNTER — Encounter: Payer: Self-pay | Admitting: Family Medicine

## 2022-12-01 ENCOUNTER — Other Ambulatory Visit: Payer: Self-pay | Admitting: Family Medicine

## 2022-12-01 DIAGNOSIS — E119 Type 2 diabetes mellitus without complications: Secondary | ICD-10-CM

## 2022-12-24 ENCOUNTER — Ambulatory Visit: Payer: 59

## 2022-12-24 NOTE — Progress Notes (Unsigned)
Subjective:   Alyssa Montgomery is a 62 y.o. female who presents for an Initial Medicare Annual Wellness Visit.  Visit Complete: {VISITMETHOD:[phone]}  Patient Medicare AWV questionnaire was completed by the patient on ***; I have confirmed that all information answered by patient is correct and no changes since this date.  Review of Systems    ***       Objective:    There were no vitals filed for this visit. There is no height or weight on file to calculate BMI.     11/13/2022    4:03 PM 10/04/2022   11:19 AM 08/06/2022    4:08 PM 05/11/2022    9:26 AM 04/30/2022    2:39 PM 04/04/2022    2:55 PM 03/15/2022    4:27 PM  Advanced Directives  Does Patient Have a Medical Advance Directive? No No No No No No No  Would patient like information on creating a medical advance directive?  No - Patient declined No - Patient declined No - Patient declined No - Patient declined No - Patient declined No - Patient declined    Current Medications (verified) Outpatient Encounter Medications as of 12/24/2022  Medication Sig   albuterol (VENTOLIN HFA) 108 (90 Base) MCG/ACT inhaler INHALE 2 PUFFS INTO THE LUNGS EVERY 6 HOURS AS NEEDED FOR WHEEZING/SHORTNESS OF BREATH   benzonatate (TESSALON) 100 MG capsule Take 1 capsule (100 mg total) by mouth 3 (three) times daily as needed for cough.   bisacodyl (DULCOLAX) 5 MG EC tablet Take 5 mg by mouth daily as needed for moderate constipation.   Cholecalciferol (VITAMIN D3 PO) Take 1 tablet by mouth daily.   cyclobenzaprine (FLEXERIL) 10 MG tablet Take 1 tablet (10 mg total) by mouth 2 (two) times daily as needed for muscle spasms.   ECHINACEA PO Take 1 capsule by mouth daily.   fluticasone (FLONASE) 50 MCG/ACT nasal spray Place 1 spray into both nostrils daily. (Patient taking differently: Place 1 spray into both nostrils daily as needed for allergies.)   lansoprazole (PREVACID) 30 MG capsule Take 1 capsule (30 mg total) by mouth daily.   lidocaine  (LIDODERM) 5 % Place 1 patch onto the skin daily as needed (pain). Remove & Discard patch within 12 hours or as directed by MD   metFORMIN (GLUCOPHAGE-XR) 500 MG 24 hr tablet TAKE 2 TABLETS (1,000 MG TOTAL) BY MOUTH EVERY EVENING.   miconazole (MICONAZOLE 7) 2 % vaginal cream Place 1 Applicatorful vaginally at bedtime.   MORINGA OLEIFERA PO Take 1 tablet by mouth daily.   promethazine (PHENERGAN) 12.5 MG tablet Take 1 tablet (12.5 mg total) by mouth every 6 (six) hours as needed for nausea, vomiting or refractory nausea / vomiting.   rosuvastatin (CRESTOR) 5 MG tablet Take 1 tablet (5 mg total) by mouth daily. (Patient taking differently: Take 5 mg by mouth at bedtime.)   Simethicone (GAS-X PO) Take 2 tablets by mouth every 6 (six) hours as needed (stomach pain).   zinc gluconate 50 MG tablet Take 50 mg by mouth daily.   No facility-administered encounter medications on file as of 12/24/2022.    Allergies (verified) Zanaflex [tizanidine hcl], Penicillins, and Sulfonamide derivatives   History: Past Medical History:  Diagnosis Date   Anxiety    Bilateral carpal tunnel syndrome 03/03/2018   Bronchitis    Cervical spondylosis without myelopathy 07/29/2018   Cervicalgia 11/16/2014   Cervicogenic headache 07/29/2018   Chronic bilateral low back pain with bilateral sciatica 09/18/2018   Claudication (  HCC) 09/03/2016   Costochondritis 02/08/2022   Diverticulitis    FIBROIDS, UTERUS 03/09/2010   Qualifier: Diagnosis of  By: Leone Payor MD, Alfonse Ras E    Fibromyalgia    Fibromyalgia 06/18/2014   Gait abnormality 11/22/2020   GERD (gastroesophageal reflux disease)    Goiter 02/28/2013   Headache    Healthcare maintenance 04/17/2015   History of cervical dysplasia 01/18/2016   Hyperlipidemia    Hypertension associated with diabetes (HCC) 12/20/2019   Hypertension associated with diabetes (HCC) 12/20/2019   Left carpal tunnel syndrome 01/27/2018   Left foot pain 12/20/2019   Left hip pain  11/23/2019   Neck pain 11/22/2020   Neuropathy    Non-seasonal allergic rhinitis 07/13/2019   Pain in right ankle and joints of right foot 03/17/2019   Plantar fasciitis    Pre-operative clearance 08/12/2020   Primary osteoarthritis of left hip 09/12/2020   Right leg numbness 11/22/2020   Spinal stenosis of cervical region 07/29/2018   Substance abuse (HCC)    10 years ago ( cocaine)   TIA (transient ischemic attack) 08/10/2016   Tobacco abuse 01/18/2012   Tobacco use disorder 04/17/2015   Trochanteric bursitis of left hip 11/23/2019   Urinary frequency 11/30/2019   UTI (urinary tract infection) 06/19/2012   UTI (urinary tract infection) 06/19/2012   Viral URI with cough 02/28/2013   Past Surgical History:  Procedure Laterality Date   ANKLE SURGERY     BACK SURGERY     Bowel obstruction     CARPAL TUNNEL RELEASE     CERVICAL CONIZATION W/BX N/A 12/27/2015   Procedure: CONIZATION CERVIX WITH BIOPSY;  Surgeon: Ok Edwards, MD;  Location: [facility];  Service: Gynecology;  Laterality: N/A;   DILATATION & CURETTAGE/HYSTEROSCOPY WITH MYOSURE N/A 12/27/2015   Procedure: DILATATION & CURETTAGE/HYSTEROSCOPY WITH MYOSURE;  Surgeon: Ok Edwards, MD;  Location: [facility];  Service: Gynecology;  Laterality: N/A;   ECTOPIC PREGNANCY SURGERY     EYE SURGERY     removed right eye;    EYE SURGERY     REVERSE SHOULDER ARTHROPLASTY Left 05/11/2022   Procedure: REVERSE SHOULDER ARTHROPLASTY;  Surgeon: Beverely Low, MD;  Location: [facility];  Service: Orthopedics;  Laterality: Left;  with ISB   TOTAL HIP ARTHROPLASTY Left 09/12/2020   Procedure: TOTAL HIP ARTHROPLASTY ANTERIOR APPROACH;  Surgeon: Ollen Gross, MD;  Location: [facility];  Service: Orthopedics;  Laterality: Left;    Family History  Problem Relation Age of Onset   Hypertension Mother    Arthritis Mother    Cancer Father        lung   Other Neg Hx        pituitary problem   Colon cancer Neg Hx    Colon polyps Neg Hx     Esophageal cancer Neg Hx    Rectal cancer Neg Hx    Stomach cancer Neg Hx    Social History   Socioeconomic History   Marital status: Married    Spouse name: Not on file   Number of children: 2   Years of education: some college   Highest education level: Not on file  Occupational History   Occupation: CNA/med tech  Tobacco Use   Smoking status: Former    Current packs/day: 0.00    Average packs/day: 0.5 packs/day for 18.0 years (9.0 ttl pk-yrs)    Types: Cigarettes    Start date: 04/26/2001    Quit date: 04/27/2019    Years since quitting: 3.6  Passive exposure: Never   Smokeless tobacco: Never   Tobacco comments:    1-800 number given   Vaping Use   Vaping status: Never Used  Substance and Sexual Activity   Alcohol use: Yes    Comment: rare   Drug use: No    Comment: 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  Other Topics Concern   Not on file  Social History Narrative   Lives with husband and mother (caregiver for her mother).   Right-handed.   One soda daily.   Social Determinants of Health   Financial Resource Strain: Low Risk  (05/14/2022)   Overall Financial Resource Strain (CARDIA)    Difficulty of Paying Living Expenses: Not hard at all  Food Insecurity: No Food Insecurity (05/11/2022)   Hunger Vital Sign    Worried About Running Out of Food in the Last Year: Never true    Ran Out of Food in the Last Year: Never true  Transportation Needs: No Transportation Needs (05/11/2022)   PRAPARE - Administrator, Civil Service (Medical): No    Lack of Transportation (Non-Medical): No  Physical Activity: Not on file  Stress: Not on file  Social Connections: Not on file    Tobacco Counseling Counseling given: Not Answered Tobacco comments: 1-800 number given    Clinical Intake:                        Activities of Daily Living    05/11/2022    4:00 PM  04/30/2022    2:41 PM  In your present state of health, do you have any difficulty performing the following activities:  Hearing? 0   Vision? 0   Difficulty concentrating or making decisions? 0   Walking or climbing stairs? 0   Dressing or bathing? 0   Doing errands, shopping? 0 0    Patient Care Team: Evette Georges, MD as PCP - General (Family Medicine) Leland Her, DO (Inactive) as Resident (Family Medicine) Jadene Pierini, MD as Consulting Physician (Neurosurgery)  Indicate any recent Medical Services you may have received from other than [facility] providers in the past year (date may be approximate).     Assessment:   This is a routine wellness examination for [place].  Hearing/Vision screen No results found.  Dietary issues and exercise activities discussed:     Goals Addressed   None    Depression Screen    11/13/2022    4:02 PM 11/07/2022    4:03 PM 08/06/2022    4:08 PM 04/04/2022    2:55 PM 03/15/2022    4:26 PM 12/15/2021   10:33 AM 05/31/2021    3:32 PM  PHQ 2/9 Scores  PHQ - 2 Score 1 1 1  0 0 1 0  PHQ- 9 Score 3  3 1  0 4 2    Fall Risk    01/17/2021    4:20 PM 12/17/2019    4:38 PM 11/27/2019    2:40 PM 07/13/2019    3:56 PM 07/04/2017    4:56 PM  Fall Risk   Falls in the past year? 0 1 1 0 No  Number falls in past yr: 0 0 0 0   Injury with Fall?  0 0 0   Follow up  Falls evaluation completed       MEDICARE RISK AT HOME:   TIMED UP AND  GO:  Was the test performed? No    Cognitive Function:    11/13/2016    2:00 PM  MMSE - Mini Mental State Exam  Orientation to time 5  Orientation to Place 5  Registration 3  Attention/ Calculation 3  Recall 2  Language- name 2 objects 2  Language- repeat 1  Language- follow 3 step command 3  Language- read & follow direction 1  Write a sentence 1  Copy design 1  Total score 27        Immunizations Immunization History  Administered Date(s) Administered   Influenza Split 06/19/2012, 04/06/2015    Influenza,inj,Quad PF,6+ Mos 04/17/2013, 02/21/2016, 01/30/2019, 06/24/2019   Influenza-Unspecified 03/11/2014, 02/11/2020, 04/03/2021, 02/11/2022   PFIZER(Purple Top)SARS-COV-2 Vaccination 08/21/2019, 09/14/2019   PPD Test 04/17/2013, 06/22/2014   Pneumococcal Polysaccharide-23 04/15/2015   Rabies, IM 02/28/2017, 03/03/2017, 03/08/2017   Tdap 12/02/2015    TDAP status: Up to date  Pneumococcal vaccine status: Up to date  Covid-19 vaccine status: Information provided on how to obtain vaccines.   Qualifies for Shingles Vaccine? Yes   Zostavax completed No   {Shingrix Completed?:[id number]}  Screening Tests Health Maintenance  Topic Date Due   Zoster Vaccines- Shingrix (1 of 2) Never done   FOOT EXAM  10/19/2018   COVID-19 Vaccine (3 - 2023-24 season) 02/09/2022   Medicare Annual Wellness (AWV)  10/08/2022   INFLUENZA VACCINE  01/10/2023   Diabetic kidney evaluation - eGFR measurement  05/13/2023   HEMOGLOBIN A1C  05/15/2023   OPHTHALMOLOGY EXAM  10/16/2023   Diabetic kidney evaluation - Urine ACR  11/13/2023   MAMMOGRAM  04/05/2024   DTaP/Tdap/Td (2 - Td or Tdap) 12/01/2025   PAP SMEAR-Modifier  04/05/2027   Colonoscopy  06/30/2031   Hepatitis C Screening  Completed   HIV Screening  Completed   HPV VACCINES  Aged Out    Health Maintenance  Health Maintenance Due  Topic Date Due   Zoster Vaccines- Shingrix (1 of 2) Never done   FOOT EXAM  10/19/2018   COVID-19 Vaccine (3 - 2023-24 season) 02/09/2022   Medicare Annual Wellness (AWV)  10/08/2022    Colorectal cancer screening: Type of screening: Colonoscopy. Completed 06/29/21. Repeat every 10 years  Mammogram status: Completed 04/05/22. Repeat every year  Lung Cancer Screening: (Low Dose CT Chest recommended if Age 37-80 years, 20 pack-year currently smoking OR have quit w/in 15years.) does qualify.   Lung Cancer Screening Referral: last 11/30/22; scheduled for 04/23/23  Additional Screening:  Hepatitis C  Screening: does qualify; Completed 05/05/18  Vision Screening: Recommended annual ophthalmology exams for early detection of glaucoma and other disorders of the eye. Is the patient up to date with their annual eye exam?  {YES/NO:21197} Who is the provider or what is the name of the office in which the patient attends annual eye exams? *** If pt is not established with a provider, would they like to be referred to a provider to establish care? {YES/NO:21197}.   Dental Screening: Recommended annual dental exams for proper oral hygiene  Diabetic Foot Exam: Diabetic Foot Exam: Overdue, Pt has been advised about the importance in completing this exam. Pt is scheduled for diabetic foot exam on at next office visit.  Community Resource Referral / Chronic Care Management: CRR required this visit?  {YES/NO:21197}  CCM required this visit?  {CCM Required choices:[phone]}     Plan:     I have personally reviewed and noted the following in the patient's chart:   Medical and social  history Use of alcohol, tobacco or illicit drugs  Current medications and supplements including opioid prescriptions. {Opioid Prescriptions:[phone]} Functional ability and status Nutritional status Physical activity Advanced directives List of other physicians Hospitalizations, surgeries, and ER visits in previous 12 months Vitals Screenings to include cognitive, depression, and falls Referrals and appointments  In addition, I have reviewed and discussed with patient certain preventive protocols, quality metrics, and best practice recommendations. A written personalized care plan for preventive services as well as general preventive health recommendations were provided to patient.     Kandis Fantasia [place], [place]   11/13/5407   After Visit Summary: {CHL AMB AWV After Visit Summary:[phone]}  Nurse Notes: ***

## 2022-12-24 NOTE — Patient Instructions (Incomplete)
Alyssa Montgomery , Thank you for taking time to come for your Medicare Wellness Visit. I appreciate your ongoing commitment to your health goals. Please review the following plan we discussed and let me know if I can assist you in the future.   These are the goals we discussed:  Goals       "I am having surgery and I need help" (pt-stated)      Care Coordination Interventions: Social Work referral for patient concerns of being out of work for 3+ months and her finances. Assessed social determinant of health barriers          This is a list of the screening recommended for you and due dates:  Health Maintenance  Topic Date Due   Zoster (Shingles) Vaccine (1 of 2) Never done   Complete foot exam   10/19/2018   COVID-19 Vaccine (3 - 2023-24 season) 02/09/2022   Medicare Annual Wellness Visit  10/08/2022   Flu Shot  01/10/2023   Yearly kidney function blood test for diabetes  05/13/2023   Hemoglobin A1C  05/15/2023   Eye exam for diabetics  10/16/2023   Yearly kidney health urinalysis for diabetes  11/13/2023   Mammogram  04/05/2024   DTaP/Tdap/Td vaccine (2 - Td or Tdap) 12/01/2025   Pap Smear  04/05/2027   Colon Cancer Screening  06/30/2031   Hepatitis C Screening  Completed   HIV Screening  Completed   HPV Vaccine  Aged Out    Advanced directives: Information on Advanced Care Planning can be found at [facility] Advance Health Care Directives Advance Health Care Directives ([email or web address] Please bring a copy of your health care power of attorney and living will to the office to be added to your chart at your convenience.  Conditions/risks identified: Aim for 30 minutes of exercise or brisk walking, 6-8 glasses of water, and 5 servings of fruits and vegetables each day.  Next appointment: Follow up in one year for your annual wellness visit.   Preventive Care 40-64 Years, Female Preventive care refers to lifestyle choices and visits with your health care provider  that can promote health and wellness. What does preventive care include? A yearly physical exam. This is also called an annual well check. Dental exams once or twice a year. Routine eye exams. Ask your health care provider how often you should have your eyes checked. Personal lifestyle choices, including: Daily care of your teeth and gums. Regular physical activity. Eating a healthy diet. Avoiding tobacco and drug use. Limiting alcohol use. Practicing safe sex. Taking low-dose aspirin daily starting at age 11. Taking vitamin and mineral supplements as recommended by your health care provider. What happens during an annual well check? The services and screenings done by your health care provider during your annual well check will depend on your age, overall health, lifestyle risk factors, and family history of disease. Counseling  Your health care provider may ask you questions about your: Alcohol use. Tobacco use. Drug use. Emotional well-being. Home and relationship well-being. Sexual activity. Eating habits. Work and work Astronomer. Method of birth control. Menstrual cycle. Pregnancy history. Screening  You may have the following tests or measurements: Height, weight, and BMI. Blood pressure. Lipid and cholesterol levels. These may be checked every 5 years, or more frequently if you are over 73 years old. Skin check. Lung cancer screening. You may have this screening every year starting at age 92 if you have a 30-pack-year history of smoking and currently  smoke or have quit within the past 15 years. Fecal occult blood test (FOBT) of the stool. You may have this test every year starting at age 63. Flexible sigmoidoscopy or colonoscopy. You may have a sigmoidoscopy every 5 years or a colonoscopy every 10 years starting at age 20. Hepatitis C blood test. Hepatitis B blood test. Sexually transmitted disease (STD) testing. Diabetes screening. This is done by checking your  blood sugar (glucose) after you have not eaten for a while (fasting). You may have this done every 1-3 years. Mammogram. This may be done every 1-2 years. Talk to your health care provider about when you should start having regular mammograms. This may depend on whether you have a family history of breast cancer. BRCA-related cancer screening. This may be done if you have a family history of breast, ovarian, tubal, or peritoneal cancers. Pelvic exam and Pap test. This may be done every 3 years starting at age 3. Starting at age 76, this may be done every 5 years if you have a Pap test in combination with an HPV test. Bone density scan. This is done to screen for osteoporosis. You may have this scan if you are at high risk for osteoporosis. Discuss your test results, treatment options, and if necessary, the need for more tests with your health care provider. Vaccines  Your health care provider may recommend certain vaccines, such as: Influenza vaccine. This is recommended every year. Tetanus, diphtheria, and acellular pertussis (Tdap, Td) vaccine. You may need a Td booster every 10 years. Zoster vaccine. You may need this after age 36. Pneumococcal 13-valent conjugate (PCV13) vaccine. You may need this if you have certain conditions and were not previously vaccinated. Pneumococcal polysaccharide (PPSV23) vaccine. You may need one or two doses if you smoke cigarettes or if you have certain conditions. Talk to your health care provider about which screenings and vaccines you need and how often you need them. This information is not intended to replace advice given to you by your health care provider. Make sure you discuss any questions you have with your health care provider. Document Released: 06/24/2015 Document Revised: 02/15/2016 Document Reviewed: 03/29/2015 Elsevier Interactive Patient Education  2017 [employer].    Fall Prevention in the Home Falls can cause injuries. They can happen to  people of all ages. There are many things you can do to make your home safe and to help prevent falls. What can I do on the outside of my home? Regularly fix the edges of walkways and driveways and fix any cracks. Remove anything that might make you trip as you walk through a door, such as a raised step or threshold. Trim any bushes or trees on the path to your home. Use bright outdoor lighting. Clear any walking paths of anything that might make someone trip, such as rocks or tools. Regularly check to see if handrails are loose or broken. Make sure that both sides of any steps have handrails. Any raised decks and porches should have guardrails on the edges. Have any leaves, snow, or ice cleared regularly. Use sand or salt on walking paths during winter. Clean up any spills in your garage right away. This includes oil or grease spills. What can I do in the bathroom? Use night lights. Install grab bars by the toilet and in the tub and shower. Do not use towel bars as grab bars. Use non-skid mats or decals in the tub or shower. If you need to sit down in the  shower, use a plastic, non-slip stool. Keep the floor dry. Clean up any water that spills on the floor as soon as it happens. Remove soap buildup in the tub or shower regularly. Attach bath mats securely with double-sided non-slip rug tape. Do not have throw rugs and other things on the floor that can make you trip. What can I do in the bedroom? Use night lights. Make sure that you have a light by your bed that is easy to reach. Do not use any sheets or blankets that are too big for your bed. They should not hang down onto the floor. Have a firm chair that has side arms. You can use this for support while you get dressed. Do not have throw rugs and other things on the floor that can make you trip. What can I do in the kitchen? Clean up any spills right away. Avoid walking on wet floors. Keep items that you use a lot in easy-to-reach  places. If you need to reach something above you, use a strong step stool that has a grab bar. Keep electrical cords out of the way. Do not use floor polish or wax that makes floors slippery. If you must use wax, use non-skid floor wax. Do not have throw rugs and other things on the floor that can make you trip. What can I do with my stairs? Do not leave any items on the stairs. Make sure that there are handrails on both sides of the stairs and use them. Fix handrails that are broken or loose. Make sure that handrails are as long as the stairways. Check any carpeting to make sure that it is firmly attached to the stairs. Fix any carpet that is loose or worn. Avoid having throw rugs at the top or bottom of the stairs. If you do have throw rugs, attach them to the floor with carpet tape. Make sure that you have a light switch at the top of the stairs and the bottom of the stairs. If you do not have them, ask someone to add them for you. What else can I do to help prevent falls? Wear shoes that: Do not have high heels. Have rubber bottoms. Are comfortable and fit you well. Are closed at the toe. Do not wear sandals. If you use a stepladder: Make sure that it is fully opened. Do not climb a closed stepladder. Make sure that both sides of the stepladder are locked into place. Ask someone to hold it for you, if possible. Clearly mark and make sure that you can see: Any grab bars or handrails. First and last steps. Where the edge of each step is. Use tools that help you move around (mobility aids) if they are needed. These include: Canes. Walkers. Scooters. Crutches. Turn on the lights when you go into a dark area. Replace any light bulbs as soon as they burn out. Set up your furniture so you have a clear path. Avoid moving your furniture around. If any of your floors are uneven, fix them. If there are any pets around you, be aware of where they are. Review your medicines with your doctor.  Some medicines can make you feel dizzy. This can increase your chance of falling. Ask your doctor what other things that you can do to help prevent falls. This information is not intended to replace advice given to you by your health care provider. Make sure you discuss any questions you have with your health care provider. Document Released: 03/24/2009  Document Revised: 11/03/2015 Document Reviewed: 07/02/2014 Elsevier Interactive Patient Education  2017 [employer].

## 2023-01-08 ENCOUNTER — Ambulatory Visit: Payer: 59 | Admitting: Family Medicine

## 2023-01-10 ENCOUNTER — Ambulatory Visit: Payer: 59 | Admitting: Student

## 2023-01-18 ENCOUNTER — Telehealth: Payer: Self-pay | Admitting: Family Medicine

## 2023-01-18 NOTE — Telephone Encounter (Signed)
Completed surgical evaluation form. Risk is below average as below.

## 2023-01-22 ENCOUNTER — Other Ambulatory Visit: Payer: Self-pay | Admitting: Family Medicine

## 2023-01-22 DIAGNOSIS — J3089 Other allergic rhinitis: Secondary | ICD-10-CM

## 2023-02-04 ENCOUNTER — Telehealth: Payer: Self-pay | Admitting: Family Medicine

## 2023-02-04 DIAGNOSIS — M545 Low back pain, unspecified: Secondary | ICD-10-CM

## 2023-02-04 NOTE — Telephone Encounter (Addendum)
Will send in keflex 500 mg BID for 5 days given patient is certain she has a UTI. No urine specimen required given high pretest probability. Patient has allergy to PCN but appears to have tolerated cephalosporins in the past.  ----- Message from [facility] S sent at 02/04/2023  3:02 PM EDT ----- Corliss Blacker, can you place the order for her urine specimen and she will drop it off tomorrow.  She says that she is certain that it is a UTI so can you sen the antibiotics too please.  She did not want to come in tomorrow as she has appointments.  Glennie Hawk, CMA

## 2023-02-04 NOTE — Telephone Encounter (Signed)
Called patient and she is going to put her shoulder surgery on the back burner for now because she has an issues with her hip.  She will call us if matters change.  Patient states that she thinks she has a UTI and she needs a PAP smear.  Glennie Hawk, CMA

## 2023-02-04 NOTE — Telephone Encounter (Signed)
Patient requesting completion of surgical clearance for right reverse total shoulder arthoplasty but still needs an appointment to discuss. Risk calculated as per prior note. Please call her and schedule an appointment for further discussion. If with another physician, please note clearance form is in my box for completion during her appointment should she desire to continue with surgery.

## 2023-02-05 ENCOUNTER — Other Ambulatory Visit: Payer: 59

## 2023-02-05 ENCOUNTER — Ambulatory Visit: Payer: 59

## 2023-02-05 DIAGNOSIS — M545 Low back pain, unspecified: Secondary | ICD-10-CM

## 2023-02-06 ENCOUNTER — Ambulatory Visit (INDEPENDENT_AMBULATORY_CARE_PROVIDER_SITE_OTHER): Payer: 59 | Admitting: Student

## 2023-02-06 ENCOUNTER — Ambulatory Visit: Payer: 59

## 2023-02-06 VITALS — BP 139/76 | HR 69 | Ht 64.0 in | Wt 231.2 lb

## 2023-02-06 DIAGNOSIS — M545 Low back pain, unspecified: Secondary | ICD-10-CM

## 2023-02-06 DIAGNOSIS — Z01818 Encounter for other preprocedural examination: Secondary | ICD-10-CM

## 2023-02-06 DIAGNOSIS — R252 Cramp and spasm: Secondary | ICD-10-CM

## 2023-02-06 DIAGNOSIS — K219 Gastro-esophageal reflux disease without esophagitis: Secondary | ICD-10-CM | POA: Diagnosis not present

## 2023-02-06 DIAGNOSIS — R140 Abdominal distension (gaseous): Secondary | ICD-10-CM

## 2023-02-06 LAB — URINALYSIS, ROUTINE W REFLEX MICROSCOPIC
Bilirubin, UA: NEGATIVE
Glucose, UA: NEGATIVE
Ketones, UA: NEGATIVE
Leukocytes,UA: NEGATIVE
Nitrite, UA: NEGATIVE
Protein,UA: NEGATIVE
RBC, UA: NEGATIVE
Specific Gravity, UA: 1.023 (ref 1.005–1.030)
Urobilinogen, Ur: 0.2 mg/dL (ref 0.2–1.0)
pH, UA: 5.5 (ref 5.0–7.5)

## 2023-02-06 NOTE — Assessment & Plan Note (Deleted)
Symptoms most consistent with GERD vs. Consider H pylor Consider H. Pylori, cannot test today due to being on PPI Refer back to GI for evaluation Follow-up with PCP in 1 month.

## 2023-02-06 NOTE — Progress Notes (Signed)
    SUBJECTIVE:   CHIEF COMPLAINT / HPI:   Alyssa Montgomery is a 62 year old female here to discuss the following concerns:  Excessive belching Has been taking a lot of Gas-X without relief Has history of GERD Been going on for 2 months, worsening Feeling bloated Appetite is normal. Has a bit of constipation as well.  Low back pain: She discussed with her PCP regarding lower back pain.  She had a urinalysis yesterday that was negative for UTI.  Pending urine culture.  She last had an MRI lumbar spine in 2021 which showed mild lumbar spondylosis with mild spinal canal stenosis at L3-L4 and foraminal narrowing at L2-L3.  She does have prominent multilevel facet degenerative changes that were thought to be likely source of back pain at that time.  Back pain is bilateral lower back. She had a lumbar fusion 25 years ago. Going on for many years. Using lidocaine patches on her back. Taking Bengay as well. Uses Naproxen every so often. Has numbness/tingling down into right foot. No fevers, chills Loss of bowel or bladder control  PERTINENT  PMH / PSH: S/p left total hip arthroplasty, Hx lumbar fusion  OBJECTIVE:   BP 139/76   Pulse 69   Ht 5\' 4"  (1.626 m)   Wt 231 lb 3.2 oz (104.9 kg)   SpO2 100%   BMI 39.69 kg/m   General: Pleasant, well-appearing, well-groomed Cardiac: Regular rate and rhythm Respiratory: Breathes normally on room air, speaks in full sentences. Abdomen: Distended, although nontender. Extremities: No edema MSK: Tenderness to palpation of lower back.  No obvious deformities, no ecchymosis or bruising.  Normal lung flexion and extension.  ASSESSMENT/PLAN:   Back pain Longstanding problem, for at least 25 years.  S/p lumbar fusion. Symptoms most consistent with musculoskeletal issue at this time.  No CVA tenderness.  No fevers or chills. No signs of UTI (denies any dysuria, hematuria, hesitancy), pyelonephritis, nephrolithiasis or fracture. Most likely  secondary to known spondylosis/facet arthropathy. Refer back to orthopedics for evaluation for possible facet injections.  Patient is adamant about not having a repeat surgery.   Leg cramps Possibly secondary to electrolyte derangement Check magnesium, potassium today Encourage plenty of fluid hydration  Abdominal bloating With belching, nausea, constipation and night sweats.  Abdomen is distended, although nontender and she is afebrile and she does have excessive belching today. Differential includes H. pylori, esophageal stricture, peptic ulcer disease, and the "do not want to miss diagnoses" of abdominal malignancy such as ovarian malignancy, cirrhosis. Pelvic ultrasound ordered and scheduled today. Referral back to GI for possible repeat endoscopy versus H. pylori testing, or other workup. Continue PPI for now. Return precautions discussed.   Follow-up with PCP in 1 week.  Darral Dash, DO [facility]

## 2023-02-06 NOTE — Patient Instructions (Addendum)
It was great seeing you today.  As we discussed, - You may consider pain injections with orthopedics (for possible facet injections). You have arthritis in your back. Stay active, stretch, continue doing water aerobics if possible.  - For your belching,nausea,bloating please call your gastroenterologist. Continue taking your lansoprazole for now.  They may consider doing another endoscopy versus testing a for H. pylori which would require to stop taking your PPI for couple weeks.  -We ordered a pelvic ultrasound.  Please go get this completed at your scheduled time.   If you have any questions or concerns, please feel free to call the clinic.   Have a wonderful day,  Dr. Darral Dash [facility] [phone]

## 2023-02-06 NOTE — Assessment & Plan Note (Addendum)
Longstanding problem, for at least 25 years.  S/p lumbar fusion. Symptoms most consistent with musculoskeletal issue at this time.  No CVA tenderness.  No fevers or chills. No signs of UTI (denies any dysuria, hematuria, hesitancy), pyelonephritis, nephrolithiasis or fracture. Most likely secondary to known spondylosis/facet arthropathy. Refer back to orthopedics for evaluation for possible facet injections.  Patient is adamant about not having a repeat surgery.

## 2023-02-06 NOTE — Assessment & Plan Note (Signed)
With belching, nausea, constipation and night sweats.  Abdomen is distended, although nontender and she is afebrile and she does have excessive belching today. Differential includes H. pylori, esophageal stricture, peptic ulcer disease, and the "do not want to miss diagnoses" of abdominal malignancy such as ovarian malignancy, cirrhosis. Pelvic ultrasound ordered and scheduled today. Referral back to GI for possible repeat endoscopy versus H. pylori testing, or other workup. Continue PPI for now. Return precautions discussed.

## 2023-02-06 NOTE — Assessment & Plan Note (Signed)
Possibly secondary to electrolyte derangement Check magnesium, potassium today Encourage plenty of fluid hydration

## 2023-02-07 LAB — BASIC METABOLIC PANEL
BUN/Creatinine Ratio: 17 (ref 12–28)
BUN: 14 mg/dL (ref 8–27)
CO2: 25 mmol/L (ref 20–29)
Calcium: 9.3 mg/dL (ref 8.7–10.3)
Chloride: 100 mmol/L (ref 96–106)
Creatinine, Ser: 0.83 mg/dL (ref 0.57–1.00)
Glucose: 144 mg/dL — ABNORMAL HIGH (ref 70–99)
Potassium: 5 mmol/L (ref 3.5–5.2)
Sodium: 139 mmol/L (ref 134–144)
eGFR: 79 mL/min/{1.73_m2} (ref >59)

## 2023-02-07 LAB — MAGNESIUM: Magnesium: 2.1 mg/dL (ref 1.6–2.3)

## 2023-02-07 LAB — LDL CHOLESTEROL, DIRECT: LDL Direct: 102 mg/dL — ABNORMAL HIGH (ref 0–99)

## 2023-02-07 LAB — CBC
Hematocrit: 41.9 % (ref 34.0–46.6)
Hemoglobin: 13 g/dL (ref 11.1–15.9)
MCH: 25.6 pg — ABNORMAL LOW (ref 26.6–33.0)
MCHC: 31 g/dL — ABNORMAL LOW (ref 31.5–35.7)
MCV: 83 fL (ref 79–97)
Platelets: 279 10*3/uL (ref 150–450)
RBC: 5.08 x10E6/uL (ref 3.77–5.28)
RDW: 14.1 % (ref 11.7–15.4)
WBC: 6 10*3/uL (ref 3.4–10.8)

## 2023-02-07 LAB — HEMOGLOBIN A1C
Est. average glucose Bld gHb Est-mCnc: 174 mg/dL
Hgb A1c MFr Bld: 7.7 % — ABNORMAL HIGH (ref 4.8–5.6)

## 2023-02-07 LAB — URINE CULTURE

## 2023-02-09 ENCOUNTER — Ambulatory Visit (HOSPITAL_COMMUNITY)
Admission: RE | Admit: 2023-02-09 | Discharge: 2023-02-09 | Disposition: A | Discharge status: Home / Self Care | Payer: 59 | Source: Ambulatory Visit | Attending: Family Medicine | Admitting: Family Medicine

## 2023-02-09 DIAGNOSIS — R140 Abdominal distension (gaseous): Secondary | ICD-10-CM | POA: Insufficient documentation

## 2023-02-18 ENCOUNTER — Encounter: Payer: Self-pay | Admitting: Family Medicine

## 2023-02-18 ENCOUNTER — Ambulatory Visit (INDEPENDENT_AMBULATORY_CARE_PROVIDER_SITE_OTHER): Payer: 59 | Admitting: Family Medicine

## 2023-02-18 VITALS — BP 115/56 | HR 73 | Ht 64.0 in | Wt 234.4 lb

## 2023-02-18 DIAGNOSIS — R140 Abdominal distension (gaseous): Secondary | ICD-10-CM

## 2023-02-18 DIAGNOSIS — Z23 Encounter for immunization: Secondary | ICD-10-CM

## 2023-02-18 DIAGNOSIS — M160 Bilateral primary osteoarthritis of hip: Secondary | ICD-10-CM

## 2023-02-18 NOTE — Progress Notes (Signed)
    SUBJECTIVE:   CHIEF COMPLAINT / HPI:   Persistent abdominal bloating and belching Has been taking Prevacid.  Has followed with GI who done endoscopy and colonoscopy without many remarkable findings.  Had pelvic ultrasound from last visit to assess for presumed ovarian pathology which was overall negative though ovaries difficult to visualize.  Previous CT abdomen pelvis without ovarian pathology.  Is not taking any fiber supplements or any probiotics, but she does have a history of constipation.  Right hip pain Has history of severe arthritis.  Has had left hip replaced in the past.  She is having a lot of pain with walking because of this hip.  She has follow-up with orthopedics scheduled and will likely get a corticosteroid injection tomorrow 9/10.  OBJECTIVE:   BP (!) 115/56   Pulse 73   Ht 5\' 4"  (1.626 m)   Wt 234 lb 6 oz (106.3 kg)   SpO2 99%   BMI 40.23 kg/m   General: Alert and oriented, in NAD Skin: Warm, dry, and intact without lesions HEENT: NCAT, EOM grossly normal, midline nasal septum Cardiac: RRR, no m/r/g appreciated Respiratory: CTAB, breathing and speaking comfortably on RA Abdominal: Soft, diffusely tender to palpation, mildly distended, normo to hyperactive bowel sounds Neurological/MSK: No gross focal deficit, ambulates well in room, tenderness around greater trochanter with palpation, overall good range of motion of the hip to 90 degrees Psychiatric: Appropriate mood and affect   ASSESSMENT/PLAN:   Abdominal bloating Persistent while on PPI.  Reassured by vaginal ultrasound and CT abdomen pelvis, though ovaries are not visualized on the ultrasound most recently.  Wonder if persistent GERD symptoms plus or minus constipation is contributing to presentation.  Consider also gallbladder pathology with the bloating.  Will send in famotidine 20 mg daily to be taken with PPI.  Also recommended fiber supplements and probiotics with yogurt.  Will also order right  upper quadrant ultrasound to assess for gallbladder pathology.  Bilateral primary osteoarthritis of hip Right hip now worsening.  Likely due to progressive arthritis.  Elevated BMI also contributing.  Reassured she is with orthopedics and getting an injection tomorrow 9/10.  Will send in physical therapy referral for further aid.  Also recommended Voltaren gel to the area.    Health maintenance She received her flu vaccine today.  Janeal Holmes, MD [facility]

## 2023-02-18 NOTE — Patient Instructions (Addendum)
It was great to see you today! Here's what we talked about:  I have sent in famotidine to your pharmacy. Take some fiber supplements to help your bowels. You can get probiotics with yogurt. Follow with GI. I will order a RUQ ultrasound. Follow up with orthopedics about your hip. I will also refer for physical therapy. Let's see how the steroids work for you before sending in other medications.  Please let me know if you have any other questions.  Dr. Phineas Real

## 2023-02-19 MED ORDER — FAMOTIDINE 20 MG PO TABS
20.0000 mg | ORAL_TABLET | Freq: Every day | ORAL | 1 refills | Status: DC
Start: 2023-02-19 — End: 2023-03-21

## 2023-02-19 NOTE — Assessment & Plan Note (Signed)
Right hip now worsening.  Likely due to progressive arthritis.  Elevated BMI also contributing.  Reassured she is with orthopedics and getting an injection tomorrow 9/10.  Will send in physical therapy referral for further aid.  Also recommended Voltaren gel to the area.

## 2023-02-19 NOTE — Assessment & Plan Note (Signed)
Persistent while on PPI.  Reassured by vaginal ultrasound and CT abdomen pelvis, though ovaries are not visualized on the ultrasound most recently.  Wonder if persistent GERD symptoms plus or minus constipation is contributing to presentation.  Consider also gallbladder pathology with the bloating.  Will send in famotidine 20 mg daily to be taken with PPI.  Also recommended fiber supplements and probiotics with yogurt.  Will also order right upper quadrant ultrasound to assess for gallbladder pathology.

## 2023-02-21 ENCOUNTER — Encounter: Payer: Self-pay | Admitting: Family Medicine

## 2023-02-26 ENCOUNTER — Ambulatory Visit (INDEPENDENT_AMBULATORY_CARE_PROVIDER_SITE_OTHER): Payer: 59 | Admitting: *Deleted

## 2023-02-26 DIAGNOSIS — Z0000 Encounter for general adult medical examination without abnormal findings: Secondary | ICD-10-CM | POA: Diagnosis not present

## 2023-02-26 NOTE — Progress Notes (Signed)
Subjective:   Alyssa Montgomery is a 62 y.o. female who presents for an Initial Medicare Annual Wellness Visit.  Visit Complete: Virtual  I connected with  Kara Pacer on 02/26/23 by a audio enabled telemedicine application and verified that I am speaking with the correct person using two identifiers.  Patient Location: Home  Provider Location: Home Office  I discussed the limitations of evaluation and management by telemedicine. The patient expressed understanding and agreed to proceed.Vital Signs:  Unable to obtain new vitals due to this being a telehealth visit.   Cardiac Risk Factors include: advanced age (>71men, >55 women);diabetes mellitus;family history of premature cardiovascular disease;obesity (BMI >30kg/m2)     Objective:    Today's Vitals   02/26/23 1406  PainSc: 0-No pain   There is no height or weight on file to calculate BMI.     02/26/2023    2:12 PM 02/18/2023    2:03 PM 02/06/2023    9:04 AM 11/13/2022    4:03 PM 10/04/2022   11:19 AM 08/06/2022    4:08 PM 05/11/2022    9:26 AM  Advanced Directives  Does Patient Have a Medical Advance Directive? No No No No No No No  Would patient like information on creating a medical advance directive?  No - Patient declined   No - Patient declined No - Patient declined No - Patient declined    Current Medications (verified) Outpatient Encounter Medications as of 02/26/2023  Medication Sig   albuterol (VENTOLIN HFA) 108 (90 Base) MCG/ACT inhaler INHALE 2 PUFFS INTO THE LUNGS EVERY 6 HOURS AS NEEDED FOR WHEEZING/SHORTNESS OF BREATH   benzonatate (TESSALON) 100 MG capsule Take 1 capsule (100 mg total) by mouth 3 (three) times daily as needed for cough.   bisacodyl (DULCOLAX) 5 MG EC tablet Take 5 mg by mouth daily as needed for moderate constipation.   Cholecalciferol (VITAMIN D3 PO) Take 1 tablet by mouth daily.   cyclobenzaprine (FLEXERIL) 10 MG tablet Take 1 tablet (10 mg total) by mouth 2 (two) times daily as needed for  muscle spasms.   ECHINACEA PO Take 1 capsule by mouth daily.   famotidine (PEPCID) 20 MG tablet Take 1 tablet (20 mg total) by mouth daily.   lansoprazole (PREVACID) 30 MG capsule Take 1 capsule (30 mg total) by mouth daily.   lidocaine (LIDODERM) 5 % Place 1 patch onto the skin daily as needed (pain). Remove & Discard patch within 12 hours or as directed by MD   metFORMIN (GLUCOPHAGE-XR) 500 MG 24 hr tablet TAKE 2 TABLETS (1,000 MG TOTAL) BY MOUTH EVERY EVENING.   miconazole (MICONAZOLE 7) 2 % vaginal cream Place 1 Applicatorful vaginally at bedtime.   MORINGA OLEIFERA PO Take 1 tablet by mouth daily.   promethazine (PHENERGAN) 12.5 MG tablet Take 1 tablet (12.5 mg total) by mouth every 6 (six) hours as needed for nausea, vomiting or refractory nausea / vomiting.   rosuvastatin (CRESTOR) 5 MG tablet Take 1 tablet (5 mg total) by mouth daily. (Patient taking differently: Take 5 mg by mouth at bedtime.)   zinc gluconate 50 MG tablet Take 50 mg by mouth daily.   No facility-administered encounter medications on file as of 02/26/2023.    Allergies (verified) Zanaflex [tizanidine hcl], Penicillins, and Sulfonamide derivatives   History: Past Medical History:  Diagnosis Date   Anxiety    Bilateral carpal tunnel syndrome 03/03/2018   Bronchitis    Cervical spondylosis without myelopathy 07/29/2018   Cervicalgia 11/16/2014  Cervicogenic headache 07/29/2018   Chronic bilateral low back pain with bilateral sciatica 09/18/2018   Claudication (HCC) 09/03/2016   Costochondritis 02/08/2022   Diverticulitis    FIBROIDS, UTERUS 03/09/2010   Qualifier: Diagnosis of  By: Leone Payor MD, Alfonse Ras E    Fibromyalgia    Fibromyalgia 06/18/2014   Gait abnormality 11/22/2020   GERD (gastroesophageal reflux disease)    Goiter 02/28/2013   Headache    Healthcare maintenance 04/17/2015   History of cervical dysplasia 01/18/2016   Hyperlipidemia    Hypertension associated with diabetes (HCC) 12/20/2019    Hypertension associated with diabetes (HCC) 12/20/2019   Left carpal tunnel syndrome 01/27/2018   Left foot pain 12/20/2019   Left hip pain 11/23/2019   Neck pain 11/22/2020   Neuropathy    Non-seasonal allergic rhinitis 07/13/2019   Pain in right ankle and joints of right foot 03/17/2019   Plantar fasciitis    Pre-operative clearance 08/12/2020   Primary osteoarthritis of left hip 09/12/2020   Right leg numbness 11/22/2020   Spinal stenosis of cervical region 07/29/2018   Substance abuse (HCC)    10 years ago ( cocaine)   TIA (transient ischemic attack) 08/10/2016   Tobacco abuse 01/18/2012   Tobacco use disorder 04/17/2015   Trochanteric bursitis of left hip 11/23/2019   Urinary frequency 11/30/2019   UTI (urinary tract infection) 06/19/2012   UTI (urinary tract infection) 06/19/2012   Viral URI with cough 02/28/2013   Past Surgical History:  Procedure Laterality Date   ANKLE SURGERY     BACK SURGERY     Bowel obstruction     CARPAL TUNNEL RELEASE     CERVICAL CONIZATION W/BX N/A 12/27/2015   Procedure: CONIZATION CERVIX WITH BIOPSY;  Surgeon: Ok Edwards, MD;  Location: [facility];  Service: Gynecology;  Laterality: N/A;   DILATATION & CURETTAGE/HYSTEROSCOPY WITH MYOSURE N/A 12/27/2015   Procedure: DILATATION & CURETTAGE/HYSTEROSCOPY WITH MYOSURE;  Surgeon: Ok Edwards, MD;  Location: [facility];  Service: Gynecology;  Laterality: N/A;   ECTOPIC PREGNANCY SURGERY     EYE SURGERY     removed right eye;    EYE SURGERY     REVERSE SHOULDER ARTHROPLASTY Left 05/11/2022   Procedure: REVERSE SHOULDER ARTHROPLASTY;  Surgeon: Beverely Low, MD;  Location: [facility];  Service: Orthopedics;  Laterality: Left;  with ISB   TOTAL HIP ARTHROPLASTY Left 09/12/2020   Procedure: TOTAL HIP ARTHROPLASTY ANTERIOR APPROACH;  Surgeon: Ollen Gross, MD;  Location: [facility];  Service: Orthopedics;  Laterality: Left;    Family History  Problem Relation Age of Onset   Hypertension Mother     Arthritis Mother    Cancer Father        lung   Other Neg Hx        pituitary problem   Colon cancer Neg Hx    Colon polyps Neg Hx    Esophageal cancer Neg Hx    Rectal cancer Neg Hx    Stomach cancer Neg Hx    Social History   Socioeconomic History   Marital status: Married    Spouse name: Not on file   Number of children: 2   Years of education: some college   Highest education level: Not on file  Occupational History   Occupation: CNA/med tech  Tobacco Use   Smoking status: Former    Current packs/day: 0.00    Average packs/day: 0.5 packs/day for 18.0 years (9.0 ttl pk-yrs)    Types: Cigarettes  Start date: 04/26/2001    Quit date: 04/27/2019    Years since quitting: 3.8    Passive exposure: Never   Smokeless tobacco: Never   Tobacco comments:    1-800 number given   Vaping Use   Vaping status: Never Used  Substance and Sexual Activity   Alcohol use: Yes    Comment: rare   Drug use: No    Comment: 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  Other Topics Concern   Not on file  Social History Narrative   Lives with husband and mother (caregiver for her mother).   Right-handed.   One soda daily.   Social Determinants of Health   Financial Resource Strain: Low Risk  (02/26/2023)   Overall Financial Resource Strain (CARDIA)    Difficulty of Paying Living Expenses: Not hard at all  Food Insecurity: No Food Insecurity (02/26/2023)   Hunger Vital Sign    Worried About Running Out of Food in the Last Year: Never true    Ran Out of Food in the Last Year: Never true  Transportation Needs: No Transportation Needs (02/26/2023)   PRAPARE - Administrator, Civil Service (Medical): No    Lack of Transportation (Non-Medical): No  Physical Activity: Insufficiently Active (02/26/2023)   Exercise Vital Sign    Days of Exercise per Week: 4 days    Minutes of Exercise per Session: 20 min   Stress: No Stress Concern Present (02/26/2023)   [employer] of Occupational Health - Occupational Stress Questionnaire    Feeling of Stress : Not at all  Social Connections: Moderately Integrated (02/26/2023)   Social Connection and Isolation Panel [NHANES]    Frequency of Communication with Friends and Family: Three times a week    Frequency of Social Gatherings with Friends and Family: Once a week    Attends Religious Services: More than 4 times per year    Active Member of [employer] or Organizations: Yes    Attends Banker Meetings: More than 4 times per year    Marital Status: Widowed    Tobacco Counseling Counseling given: Not Answered Tobacco comments: 1-800 number given    Clinical Intake:  Pre-visit preparation completed: Yes  Pain : 0-10 Pain Score: 0-No pain     Diabetes: Yes CBG done?: No Did pt. bring in CBG monitor from home?: No  How often do you need to have someone help you when you read instructions, pamphlets, or other written materials from your doctor or pharmacy?: 1 - Never  Interpreter Needed?: No  Information entered by :: Remi Haggard LPN   Activities of Daily Living    02/26/2023    2:13 PM 05/11/2022    4:00 PM  In your present state of health, do you have any difficulty performing the following activities:  Hearing? 0 0  Vision? 0 0  Difficulty concentrating or making decisions? 0 0  Walking or climbing stairs? 1 0  Dressing or bathing? 0 0  Doing errands, shopping? 1 0  Preparing Food and eating ? N   Using the Toilet? N   In the past six months, have you accidently leaked urine? Y   Do you have problems with loss of bowel control? N   Managing your Medications? N   Managing your Finances? N   Housekeeping or managing your Housekeeping? N     Patient Care Team: Evette Georges, MD  as PCP - General (Family Medicine) Leland Her, DO (Inactive) as Resident (Family Medicine) Jadene Pierini, MD as Consulting  Physician (Neurosurgery)  Indicate any recent Medical Services you may have received from other than [facility] providers in the past year (date may be approximate).     Assessment:   This is a routine wellness examination for [place].  Hearing/Vision screen Hearing Screening - Comments:: No trouble hearing Vision Screening - Comments:: Up to date  Has prosthetic eye  [facility]   Goals Addressed             This Visit's Progress    Patient Stated       Would like go back school       Depression Screen    02/26/2023    2:17 PM 02/06/2023    9:03 AM 11/13/2022    4:02 PM 11/07/2022    4:03 PM 08/06/2022    4:08 PM 04/04/2022    2:55 PM 03/15/2022    4:26 PM  PHQ 2/9 Scores  PHQ - 2 Score 1 2 1 1 1  0 0  PHQ- 9 Score 2 4 3  3 1  0    Fall Risk    02/26/2023    2:07 PM 01/17/2021    4:20 PM 12/17/2019    4:38 PM 11/27/2019    2:40 PM 07/13/2019    3:56 PM  Fall Risk   Falls in the past year? 1 0 1 1 0  Number falls in past yr:  0 0 0 0  Injury with Fall?   0 0 0  Follow up Falls evaluation completed;Education provided;Falls prevention discussed  Falls evaluation completed      MEDICARE RISK AT HOME:    TIMED UP AND GO:  Was the test performed? No    Cognitive Function:    11/13/2016    2:00 PM  MMSE - Mini Mental State Exam  Orientation to time 5  Orientation to Place 5  Registration 3  Attention/ Calculation 3  Recall 2  Language- name 2 objects 2  Language- repeat 1  Language- follow 3 step command 3  Language- read & follow direction 1  Write a sentence 1  Copy design 1  Total score 27        02/26/2023    2:18 PM  6CIT Screen  What Year? 0 points  What month? 0 points  What time? 0 points  Count back from 20 0 points  Months in reverse 0 points  Repeat phrase 0 points  Total Score 0 points    Immunizations Immunization History  Administered Date(s) Administered   Influenza Split 06/19/2012, 04/06/2015   Influenza, Seasonal, Injecte, Preservative  Fre 02/18/2023   Influenza,inj,Quad PF,6+ Mos 04/17/2013, 02/21/2016, 01/30/2019, 06/24/2019   Influenza-Unspecified 03/11/2014, 02/11/2020, 04/03/2021, 02/11/2022   PFIZER(Purple Top)SARS-COV-2 Vaccination 08/21/2019, 09/14/2019   PPD Test 04/17/2013, 06/22/2014   Pneumococcal Polysaccharide-23 04/15/2015   Rabies, IM 02/28/2017, 03/03/2017, 03/08/2017   Tdap 12/02/2015    TDAP status: Up to date  Flu Vaccine status: Up to date    Covid-19 vaccine status: Declined, Education has been provided regarding the importance of this vaccine but patient still declined. Advised may receive this vaccine at local pharmacy or Health Dept.or vaccine clinic. Aware to provide a copy of the vaccination record if obtained from local pharmacy or Health Dept. Verbalized acceptance and understanding.  Qualifies for Shingles Vaccine? Yes   Zostavax completed No   Shingrix Completed?: No.    Education  has been provided regarding the importance of this vaccine. Patient has been advised to call insurance company to determine out of pocket expense if they have not yet received this vaccine. Advised may also receive vaccine at local pharmacy or Health Dept. Verbalized acceptance and understanding.  Screening Tests Health Maintenance  Topic Date Due   FOOT EXAM  10/19/2018   COVID-19 Vaccine (3 - Pfizer risk series) 03/06/2023 (Originally 10/12/2019)   Zoster Vaccines- Shingrix (1 of 2) 05/28/2023 (Originally 09/09/1979)   HEMOGLOBIN A1C  08/09/2023   OPHTHALMOLOGY EXAM  10/16/2023   Diabetic kidney evaluation - Urine ACR  11/13/2023   Diabetic kidney evaluation - eGFR measurement  02/06/2024   Medicare Annual Wellness (AWV)  02/26/2024   MAMMOGRAM  04/05/2024   DTaP/Tdap/Td (2 - Td or Tdap) 12/01/2025   Cervical Cancer Screening (HPV/Pap Cotest)  04/05/2027   Colonoscopy  06/30/2031   INFLUENZA VACCINE  Completed   Hepatitis C Screening  Completed   HIV Screening  Completed   HPV VACCINES  Aged Out     Health Maintenance  Health Maintenance Due  Topic Date Due   FOOT EXAM  10/19/2018    Colorectal cancer screening: Type of screening: Colonoscopy. Completed 2023. Repeat every 10 years  Mammogram status: Completed  . Repeat every year    Lung Cancer Screening: (Low Dose CT Chest recommended if Age 1-80 years, 20 pack-year currently smoking OR have quit w/in 15years.) does not qualify.   Lung Cancer Screening Referral:   Additional Screening:  Hepatitis C Screening: does not qualify; Completed 2019  Vision Screening: Recommended annual ophthalmology exams for early detection of glaucoma and other disorders of the eye. Is the patient up to date with their annual eye exam?  Yes  Who is the provider or what is the name of the office in which the patient attends annual eye exams? [facility] If pt is not established with a provider, would they like to be referred to a provider to establish care? No .   Dental Screening: Recommended annual dental exams for proper oral hygiene  Nutrition Risk Assessment:  Has the patient had any N/V/D within the last 2 months?  No  Does the patient have any non-healing wounds?  No  Has the patient had any unintentional weight loss or weight gain?  No   Diabetes:  Is the patient diabetic?  Yes  If diabetic, was a CBG obtained today?  No  Did the patient bring in their glucometer from home?  No  How often do you monitor your CBG's? Does not check.   Financial Strains and Diabetes Management:  Are you having any financial strains with the device, your supplies or your medication? No .  Does the patient want to be seen by Chronic Care Management for management of their diabetes?  No  Would the patient like to be referred to a Nutritionist or for Diabetic Management?  No   Diabetic Exams:  Diabetic Eye Exam: Completed . Overdue for diabetic eye exam. Pt has been advised about the importance in completing this exam. .   Diabetic Foot Exam:. Pt  has been advised about the importance in completing this exam.  Community Resource Referral / Chronic Care Management: CRR required this visit?  No   CCM required this visit?  No     Plan:     I have personally reviewed and noted the following in the patient's chart:   Medical and social history Use of alcohol, tobacco or illicit  drugs  Current medications and supplements including opioid prescriptions. Patient is not currently taking opioid prescriptions. Functional ability and status Nutritional status Physical activity Advanced directives List of other physicians Hospitalizations, surgeries, and ER visits in previous 12 months Vitals Screenings to include cognitive, depression, and falls Referrals and appointments  In addition, I have reviewed and discussed with patient certain preventive protocols, quality metrics, and best practice recommendations. A written personalized care plan for preventive services as well as general preventive health recommendations were provided to patient.     Remi Haggard, LPN   [id number]/[id number]   After Visit Summary: (MyChart) Due to this being a telephonic visit, the after visit summary with patients personalized plan was offered to patient via MyChart   Nurse Notes:

## 2023-02-26 NOTE — Patient Instructions (Signed)
Alyssa Montgomery , Thank you for taking time to come for your Medicare Wellness Visit. I appreciate your ongoing commitment to your health goals. Please review the following plan we discussed and let me know if I can assist you in the future.   Screening recommendations/referrals: Colonoscopy: up to date Mammogram: up to date  Recommended yearly ophthalmology/optometry visit for glaucoma screening and checkup Recommended yearly dental visit for hygiene and checkup  Vaccinations: Influenza vaccine: up to date Tdap vaccine: up to date Shingles vaccine: declined    Advanced directives: Education provided    Preventive Care 40-64 Years and Older, Female Preventive care refers to lifestyle choices and visits with your health care provider that can promote health and wellness. What does preventive care include? A yearly physical exam. This is also called an annual well check. Dental exams once or twice a year. Routine eye exams. Ask your health care provider how often you should have your eyes checked. Personal lifestyle choices, including: Daily care of your teeth and gums. Regular physical activity. Eating a healthy diet. Avoiding tobacco and drug use. Limiting alcohol use. Practicing safe sex. Taking low-dose aspirin every day. Taking vitamin and mineral supplements as recommended by your health care provider. What happens during an annual well check? The services and screenings done by your health care provider during your annual well check will depend on your age, overall health, lifestyle risk factors, and family history of disease. Counseling  Your health care provider may ask you questions about your: Alcohol use. Tobacco use. Drug use. Emotional well-being. Home and relationship well-being. Sexual activity. Eating habits. History of falls. Memory and ability to understand (cognition). Work and work Astronomer. Reproductive health. Screening  You may have the following  tests or measurements: Height, weight, and BMI. Blood pressure. Lipid and cholesterol levels. These may be checked every 5 years, or more frequently if you are over 23 years old. Skin check. Lung cancer screening. You may have this screening every year starting at age 67 if you have a 30-pack-year history of smoking and currently smoke or have quit within the past 15 years. Fecal occult blood test (FOBT) of the stool. You may have this test every year starting at age 94. Flexible sigmoidoscopy or colonoscopy. You may have a sigmoidoscopy every 5 years or a colonoscopy every 10 years starting at age 68. Hepatitis C blood test. Hepatitis B blood test. Sexually transmitted disease (STD) testing. Diabetes screening. This is done by checking your blood sugar (glucose) after you have not eaten for a while (fasting). You may have this done every 1-3 years. Bone density scan. This is done to screen for osteoporosis. You may have this done starting at age 40. Mammogram. This may be done every 1-2 years. Talk to your health care provider about how often you should have regular mammograms. Talk with your health care provider about your test results, treatment options, and if necessary, the need for more tests. Vaccines  Your health care provider may recommend certain vaccines, such as: Influenza vaccine. This is recommended every year. Tetanus, diphtheria, and acellular pertussis (Tdap, Td) vaccine. You may need a Td booster every 10 years. Zoster vaccine. You may need this after age 60. Pneumococcal 13-valent conjugate (PCV13) vaccine. One dose is recommended after age 49. Pneumococcal polysaccharide (PPSV23) vaccine. One dose is recommended after age 68. Talk to your health care provider about which screenings and vaccines you need and how often you need them. This information is not intended to replace  advice given to you by your health care provider. Make sure you discuss any questions you have with  your health care provider. Document Released: 06/24/2015 Document Revised: 02/15/2016 Document Reviewed: 03/29/2015 Elsevier Interactive Patient Education  2017 [employer].  Fall Prevention in the Home Falls can cause injuries. They can happen to people of all ages. There are many things you can do to make your home safe and to help prevent falls. What can I do on the outside of my home? Regularly fix the edges of walkways and driveways and fix any cracks. Remove anything that might make you trip as you walk through a door, such as a raised step or threshold. Trim any bushes or trees on the path to your home. Use bright outdoor lighting. Clear any walking paths of anything that might make someone trip, such as rocks or tools. Regularly check to see if handrails are loose or broken. Make sure that both sides of any steps have handrails. Any raised decks and porches should have guardrails on the edges. Have any leaves, snow, or ice cleared regularly. Use sand or salt on walking paths during winter. Clean up any spills in your garage right away. This includes oil or grease spills. What can I do in the bathroom? Use night lights. Install grab bars by the toilet and in the tub and shower. Do not use towel bars as grab bars. Use non-skid mats or decals in the tub or shower. If you need to sit down in the shower, use a plastic, non-slip stool. Keep the floor dry. Clean up any water that spills on the floor as soon as it happens. Remove soap buildup in the tub or shower regularly. Attach bath mats securely with double-sided non-slip rug tape. Do not have throw rugs and other things on the floor that can make you trip. What can I do in the bedroom? Use night lights. Make sure that you have a light by your bed that is easy to reach. Do not use any sheets or blankets that are too big for your bed. They should not hang down onto the floor. Have a firm chair that has side arms. You can use this  for support while you get dressed. Do not have throw rugs and other things on the floor that can make you trip. What can I do in the kitchen? Clean up any spills right away. Avoid walking on wet floors. Keep items that you use a lot in easy-to-reach places. If you need to reach something above you, use a strong step stool that has a grab bar. Keep electrical cords out of the way. Do not use floor polish or wax that makes floors slippery. If you must use wax, use non-skid floor wax. Do not have throw rugs and other things on the floor that can make you trip. What can I do with my stairs? Do not leave any items on the stairs. Make sure that there are handrails on both sides of the stairs and use them. Fix handrails that are broken or loose. Make sure that handrails are as long as the stairways. Check any carpeting to make sure that it is firmly attached to the stairs. Fix any carpet that is loose or worn. Avoid having throw rugs at the top or bottom of the stairs. If you do have throw rugs, attach them to the floor with carpet tape. Make sure that you have a light switch at the top of the stairs and the bottom  of the stairs. If you do not have them, ask someone to add them for you. What else can I do to help prevent falls? Wear shoes that: Do not have high heels. Have rubber bottoms. Are comfortable and fit you well. Are closed at the toe. Do not wear sandals. If you use a stepladder: Make sure that it is fully opened. Do not climb a closed stepladder. Make sure that both sides of the stepladder are locked into place. Ask someone to hold it for you, if possible. Clearly mark and make sure that you can see: Any grab bars or handrails. First and last steps. Where the edge of each step is. Use tools that help you move around (mobility aids) if they are needed. These include: Canes. Walkers. Scooters. Crutches. Turn on the lights when you go into a dark area. Replace any light bulbs as  soon as they burn out. Set up your furniture so you have a clear path. Avoid moving your furniture around. If any of your floors are uneven, fix them. If there are any pets around you, be aware of where they are. Review your medicines with your doctor. Some medicines can make you feel dizzy. This can increase your chance of falling. Ask your doctor what other things that you can do to help prevent falls. This information is not intended to replace advice given to you by your health care provider. Make sure you discuss any questions you have with your health care provider. Document Released: 03/24/2009 Document Revised: 11/03/2015 Document Reviewed: 07/02/2014 Elsevier Interactive Patient Education  2017 [employer].

## 2023-02-27 ENCOUNTER — Encounter: Payer: Self-pay | Admitting: Family Medicine

## 2023-03-04 ENCOUNTER — Ambulatory Visit
Admission: RE | Admit: 2023-03-04 | Discharge: 2023-03-04 | Disposition: A | Discharge status: Home / Self Care | Payer: 59 | Source: Ambulatory Visit | Attending: Family Medicine

## 2023-03-04 DIAGNOSIS — R140 Abdominal distension (gaseous): Secondary | ICD-10-CM

## 2023-03-08 ENCOUNTER — Encounter (HOSPITAL_BASED_OUTPATIENT_CLINIC_OR_DEPARTMENT_OTHER): Payer: Self-pay | Admitting: Physical Therapy

## 2023-03-21 ENCOUNTER — Other Ambulatory Visit: Payer: Self-pay | Admitting: Family Medicine

## 2023-03-21 DIAGNOSIS — R140 Abdominal distension (gaseous): Secondary | ICD-10-CM

## 2023-04-01 ENCOUNTER — Telehealth: Payer: Self-pay | Admitting: Family Medicine

## 2023-04-01 NOTE — Telephone Encounter (Signed)
Received pre-op eval form for patient's upcoming right total hip arthroplasty. Would like to see in clinic for formal evaluation. Please help her get scheduled for this.

## 2023-04-02 ENCOUNTER — Telehealth: Payer: Self-pay

## 2023-04-02 NOTE — Telephone Encounter (Signed)
Patient calls nurse line requesting a medication for gout.   She reports she started having pain on her left foot staring on Friday. She reports as the weekend went on the pain has gotten progressively worse. She reports her foot is too swollen and painful to put shoes on.   She reports she has never had this issue before, however reports her mother had the same symptoms all the time and took "gout" medication.   Patient has an apt already with PCP on 11/5.  She reports she would like to try medication and FU as scheduled. She reports she can not come in due to the swelling and pain in her foot and not being able to get her shoe on.   Advised will forward to PCP for advisement.

## 2023-04-02 NOTE — Telephone Encounter (Signed)
Called patient and lvm for her to call back to schedule appointment for form completion.   Thank you Nehemiah Settle

## 2023-04-04 NOTE — Telephone Encounter (Signed)
Called patient.   Patient reports she bought "some organic cream" online.   Patient voiced frustration on not receiving a prescription from PCP.   Will FU as planned on 11/5.

## 2023-04-16 ENCOUNTER — Ambulatory Visit (INDEPENDENT_AMBULATORY_CARE_PROVIDER_SITE_OTHER): Payer: 59 | Admitting: Family Medicine

## 2023-04-16 ENCOUNTER — Ambulatory Visit (HOSPITAL_COMMUNITY)
Admission: RE | Admit: 2023-04-16 | Discharge: 2023-04-16 | Disposition: A | Discharge status: Home / Self Care | Payer: 59 | Source: Ambulatory Visit | Attending: Family Medicine | Admitting: Family Medicine

## 2023-04-16 ENCOUNTER — Encounter: Payer: Self-pay | Admitting: Family Medicine

## 2023-04-16 VITALS — BP 140/76 | HR 73 | Ht 64.0 in | Wt 238.2 lb

## 2023-04-16 DIAGNOSIS — E785 Hyperlipidemia, unspecified: Secondary | ICD-10-CM | POA: Diagnosis not present

## 2023-04-16 DIAGNOSIS — E1169 Type 2 diabetes mellitus with other specified complication: Secondary | ICD-10-CM

## 2023-04-16 DIAGNOSIS — M79672 Pain in left foot: Secondary | ICD-10-CM | POA: Diagnosis not present

## 2023-04-16 DIAGNOSIS — Z7984 Long term (current) use of oral hypoglycemic drugs: Secondary | ICD-10-CM

## 2023-04-16 DIAGNOSIS — Z01810 Encounter for preprocedural cardiovascular examination: Secondary | ICD-10-CM | POA: Insufficient documentation

## 2023-04-16 DIAGNOSIS — Z01818 Encounter for other preprocedural examination: Secondary | ICD-10-CM

## 2023-04-16 DIAGNOSIS — E119 Type 2 diabetes mellitus without complications: Secondary | ICD-10-CM | POA: Diagnosis not present

## 2023-04-16 LAB — POCT GLYCOSYLATED HEMOGLOBIN (HGB A1C): HbA1c, POC (controlled diabetic range): 7.3 % — AB (ref 0.0–7.0)

## 2023-04-16 NOTE — Assessment & Plan Note (Signed)
Improving.  Less likely gout given spontaneous resolution but will check uric acid today.  Medial foot pain could be posterior tibial tendinitis.  Continue current conservative management since this is improving.  Follow-up if recurs.

## 2023-04-16 NOTE — Progress Notes (Signed)
SUBJECTIVE:   Chief Complaint  Patient presents with   pre op clearance    a1c check   cholesterol check     Been off meds for 2 months     Alyssa Montgomery  is here for a Pre-operative physical at the request of Dr. Lequita Halt.   She  is having right total hip arthroplasty surgery on 06/26/2023. She also has been having left foot pain where she could not walk on it for about 3 days about 3 weeks ago.  It was swollen at the time.  It is since gotten better with some uric acid cleansing drops she bought off [employer].  The pain is mostly on the medial side of the foot. She would also like to get her cholesterol checked since she has been off of her statin for 2 months now.  Personal adverse outcome to anesthesia? No -had no problems 5 years ago when she had her left hip arthroplasty  Revised Goldman Criteria: High Risk Surgery (intraperitoneal, intrathoracic, aortic): No  Ischemic heart disease (Prior MI, +excercise stress test, angina, nitrate use, Qwave): No  History of congestive heart failure: No  History of cerebrovascular disease (TIA or stroke): No  Diabetes requiring preoperative treatment with insulin: No  Preoperative Cr >2.0:  to be completed by surgical team per form  Revised Cardiac Risk Index Scoring - risk for death, MI, or cardiac arrest No risk factors -- 0.4% One risk factor -- 0.9%  Two risk factors -- 6.6%  Three or more risk factors -- >11%   Patient Active Problem List   Diagnosis Date Noted   BMI 40.0-44.9, adult (HCC) 04/27/2021   Leg cramps 02/06/2023   Abdominal bloating 02/06/2023   Need for shingles vaccine 11/14/2022   Screening for lung cancer 11/14/2022   Cough 11/07/2022   H/O total shoulder replacement, 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   Diverticulosis 07/06/2021   Morbid obesity (HCC) 04/27/2021   Peripheral polyneuropathy 05/27/2020   Neuropathy 02/15/2020   Bilateral primary osteoarthritis of hip 02/10/2020   Foot pain, left 12/20/2019   Hyperlipidemia associated with  type 2 diabetes mellitus (HCC) 11/14/2017   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   Other fatigue 08/10/2016   Diabetes mellitus without complication (HCC) 04/17/2015   History of tobacco use 04/17/2015   Complete tear of right rotator cuff 11/03/2014   Back pain 06/19/2012   GERD 12/26/2006   Past Medical History:  Diagnosis Date   Anxiety    Bilateral carpal tunnel syndrome 03/03/2018   Bronchitis    Cervical spondylosis without myelopathy 07/29/2018   Cervicalgia 11/16/2014   Cervicogenic headache 07/29/2018   Chronic bilateral low back pain with bilateral sciatica 09/18/2018   Claudication (HCC) 09/03/2016   Costochondritis 02/08/2022   Diverticulitis    FIBROIDS, UTERUS 03/09/2010   Qualifier: Diagnosis of  By: Leone Payor MD, Alfonse Ras E    Fibromyalgia    Fibromyalgia 06/18/2014   Gait abnormality 11/22/2020   GERD (gastroesophageal reflux disease)    Goiter 02/28/2013   Headache    Healthcare maintenance 04/17/2015   History of cervical dysplasia 01/18/2016   Hyperlipidemia    Hypertension associated with diabetes (HCC) 12/20/2019   Hypertension associated with diabetes (HCC) 12/20/2019   Left carpal tunnel syndrome 01/27/2018   Left foot pain 12/20/2019   Left hip pain 11/23/2019   Neck pain 11/22/2020   Neuropathy    Non-seasonal allergic rhinitis 07/13/2019   Pain in right ankle and joints of right foot 03/17/2019   Plantar fasciitis  Pre-operative clearance 08/12/2020   Primary osteoarthritis of left hip 09/12/2020   Right leg numbness 11/22/2020   Spinal stenosis of cervical region 07/29/2018   Substance abuse (HCC)    10 years ago ( cocaine)   TIA (transient ischemic attack) 08/10/2016   Tobacco abuse 01/18/2012   Tobacco use disorder 04/17/2015   Trochanteric bursitis of left hip 11/23/2019   Urinary frequency 11/30/2019   UTI (urinary tract infection) 06/19/2012   UTI (urinary tract infection) 06/19/2012   Viral URI with cough 02/28/2013     Past Surgical History:  Procedure Laterality Date   ANKLE SURGERY     BACK SURGERY     Bowel obstruction     CARPAL TUNNEL RELEASE     CERVICAL CONIZATION W/BX N/A 12/27/2015   Procedure: CONIZATION CERVIX WITH BIOPSY;  Surgeon: Ok Edwards, MD;  Location: [facility];  Service: Gynecology;  Laterality: N/A;   DILATATION & CURETTAGE/HYSTEROSCOPY WITH MYOSURE N/A 12/27/2015   Procedure: DILATATION & CURETTAGE/HYSTEROSCOPY WITH MYOSURE;  Surgeon: Ok Edwards, MD;  Location: [facility];  Service: Gynecology;  Laterality: N/A;   ECTOPIC PREGNANCY SURGERY     EYE SURGERY     removed right eye;    EYE SURGERY     REVERSE SHOULDER ARTHROPLASTY Left 05/11/2022   Procedure: REVERSE SHOULDER ARTHROPLASTY;  Surgeon: Beverely Low, MD;  Location: [facility];  Service: Orthopedics;  Laterality: Left;  with ISB   TOTAL HIP ARTHROPLASTY Left 09/12/2020   Procedure: TOTAL HIP ARTHROPLASTY ANTERIOR APPROACH;  Surgeon: Ollen Gross, MD;  Location: [facility];  Service: Orthopedics;  Laterality: Left;     Current Outpatient Medications  Medication Sig Dispense Refill   albuterol (VENTOLIN HFA) 108 (90 Base) MCG/ACT inhaler INHALE 2 PUFFS INTO THE LUNGS EVERY 6 HOURS AS NEEDED FOR WHEEZING/SHORTNESS OF BREATH 8.5 each 2   benzonatate (TESSALON) 100 MG capsule Take 1 capsule (100 mg total) by mouth 3 (three) times daily as needed for cough. 21 capsule 0   bisacodyl (DULCOLAX) 5 MG EC tablet Take 5 mg by mouth daily as needed for moderate constipation.     Cholecalciferol (VITAMIN D3 PO) Take 1 tablet by mouth daily.     cyclobenzaprine (FLEXERIL) 10 MG tablet Take 1 tablet (10 mg total) by mouth 2 (two) times daily as needed for muscle spasms. 20 tablet 0   ECHINACEA PO Take 1 capsule by mouth daily.     famotidine (PEPCID) 20 MG tablet TAKE 1 TABLET BY MOUTH EVERY DAY 90 tablet 1   lansoprazole (PREVACID) 30 MG capsule Take 1 capsule (30 mg total) by mouth daily. 90 capsule 3   lidocaine (LIDODERM) 5 % Place  1 patch onto the skin daily as needed (pain). Remove & Discard patch within 12 hours or as directed by MD 30 patch 1   metFORMIN (GLUCOPHAGE-XR) 500 MG 24 hr tablet TAKE 2 TABLETS (1,000 MG TOTAL) BY MOUTH EVERY EVENING. 180 tablet 3   miconazole (MICONAZOLE 7) 2 % vaginal cream Place 1 Applicatorful vaginally at bedtime. 45 g 0   MORINGA OLEIFERA PO Take 1 tablet by mouth daily.     promethazine (PHENERGAN) 12.5 MG tablet Take 1 tablet (12.5 mg total) by mouth every 6 (six) hours as needed for nausea, vomiting or refractory nausea / vomiting. 30 tablet 0   rosuvastatin (CRESTOR) 5 MG tablet Take 1 tablet (5 mg total) by mouth daily. (Patient taking differently: Take 5 mg by mouth at bedtime.) 90 tablet 3  zinc gluconate 50 MG tablet Take 50 mg by mouth daily.     No current facility-administered medications for this visit.    Allergies  Allergen Reactions   Zanaflex [Tizanidine Hcl] Other (See Comments)    Syncope   Penicillins Other (See Comments)    Unknown childhood reaction. Has patient had a PCN reaction causing immediate rash, facial/tongue/throat swelling, SOB or lightheadedness with hypotension: Unknown Has patient had a PCN reaction causing severe rash involving mucus membranes or skin necrosis: Unknown Has patient had a PCN reaction that required hospitalization: Unknown Has patient had a PCN reaction occurring within the last 10 years: No  Tolerated Cephalosporin Date: 09/14/20.   Sulfonamide Derivatives Itching    Social History   Socioeconomic History   Marital status: Married    Spouse name: Not on file   Number of children: 2   Years of education: some college   Highest education level: Not on file  Occupational History   Occupation: CNA/med tech  Tobacco Use   Smoking status: Former    Current packs/day: 0.00    Average packs/day: 0.5 packs/day for 18.0 years (9.0 ttl pk-yrs)    Types: Cigarettes    Start date: 04/26/2001    Quit date: 04/27/2019    Years  since quitting: 3.9    Passive exposure: Never   Smokeless tobacco: Never   Tobacco comments:    1-800 number given   Vaping Use   Vaping status: Never Used  Substance and Sexual Activity   Alcohol use: Yes    Comment: rare   Drug use: No    Comment: 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  Other Topics Concern   Not on file  Social History Narrative   Lives with husband and mother (caregiver for her mother).   Right-handed.   One soda daily.   Social Determinants of Health   Financial Resource Strain: Low Risk  (02/26/2023)   Overall Financial Resource Strain (CARDIA)    Difficulty of Paying Living Expenses: Not hard at all  Food Insecurity: No Food Insecurity (02/26/2023)   Hunger Vital Sign    Worried About Running Out of Food in the Last Year: Never true    Ran Out of Food in the Last Year: Never true  Transportation Needs: No Transportation Needs (02/26/2023)   PRAPARE - Administrator, Civil Service (Medical): No    Lack of Transportation (Non-Medical): No  Physical Activity: Insufficiently Active (02/26/2023)   Exercise Vital Sign    Days of Exercise per Week: 4 days    Minutes of Exercise per Session: 20 min  Stress: No Stress Concern Present (02/26/2023)   [employer] of Occupational Health - Occupational Stress Questionnaire    Feeling of Stress : Not at all  Social Connections: Moderately Integrated (02/26/2023)   Social Connection and Isolation Panel [NHANES]    Frequency of Communication with Friends and Family: Three times a week    Frequency of Social Gatherings with Friends and Family: Once a week    Attends Religious Services: More than 4 times per year    Active Member of [employer] or Organizations: Yes    Attends Banker Meetings: More than 4 times per year    Marital Status: Widowed  Intimate Partner Violence: Not At Risk (02/26/2023)   Humiliation,  Afraid, Rape, and Kick questionnaire    Fear of Current or Ex-Partner:  No    Emotionally Abused: No    Physically Abused: No    Sexually Abused: No    Family History  Problem Relation Age of Onset   Hypertension Mother    Arthritis Mother    Cancer Father        lung   Other Neg Hx        pituitary problem   Colon cancer Neg Hx    Colon polyps Neg Hx    Esophageal cancer Neg Hx    Rectal cancer Neg Hx    Stomach cancer Neg Hx     OBJECTIVE:   Vitals:   04/16/23 1604  BP: (!) 140/76  Pulse: 73  SpO2: 100%  Weight: 238 lb 3.2 oz (108 kg)  Height: 5\' 4"  (1.626 m)   Body mass index is 40.89 kg/m.  General:  well developed, well nourished, NAD Skin:  warm, no pallor or diaphoresis Head:  normocephalic, atraumatic Eyes:  pupils equal and round, sclera anicteric without injection Lungs:  clear to auscultation, breath sounds equal bilaterally, no respiratory distress Cardio:  regular rate and rhythm without murmurs though limited exam due to BMI Abdomen:  abdomen soft, nontender; bowel sounds normal Musculoskeletal: grossly symmetrical muscle groups noted without atrophy or deformity Extremities:  no clubbing, cyanosis, or edema, no deformities, no skin discoloration Neuro:  gait overall normal with mild limp due to improving foot pain Psych: Age appropriate judgment and insight; normal mood  ASSESSMENT/PLAN:  Diabetes mellitus without complication (HCC) Will update A1c today to aid in perioperative planning.  If remains elevated, can consider GLP-1 given likely covered under patient's Medicaid.  Hyperlipidemia associated with type 2 diabetes mellitus (HCC) Will repeat LDL today given she has been off of statins.  If remains elevated, could consider alternative agent.  Foot pain, left Improving.  Less likely gout given spontaneous resolution but will check uric acid today.  Medial foot pain could be posterior tibial tendinitis.  Continue current conservative management  since this is improving.  Follow-up if recurs.   Orders Placed This Encounter  Procedures   LDL Cholesterol, Direct   Uric Acid   HgB A1c   EKG 12-Lead   EKG 12-Lead    Ordered by an unspecified provider    Pre-operative assessment EKG - unchanged from previous tracings: NSR, ?incomplete RBBB The above laboratory work was ordered and will be sent with this physical.    This patient, as evidenced above, it at average risk of serious complication with surgery.  She has been medically optimized.  The patient voiced understanding and agreement to the plan.  She will follow-up with me after her surgery.  Janeal Holmes, MD PGY-2, [facility]

## 2023-04-16 NOTE — Patient Instructions (Signed)
I will fax over a copy of your visit to the surgeons. We will follow up labs with you today. Your EKG looks good. Keep me updated about your foot pain.

## 2023-04-16 NOTE — Assessment & Plan Note (Addendum)
Will update A1c today to aid in perioperative planning.  If remains elevated, can consider GLP-1 given likely covered under patient's Medicaid.

## 2023-04-16 NOTE — Assessment & Plan Note (Signed)
Will repeat LDL today given she has been off of statins.  If remains elevated, could consider alternative agent.

## 2023-04-17 ENCOUNTER — Telehealth: Payer: Self-pay

## 2023-04-17 LAB — LDL CHOLESTEROL, DIRECT: LDL Direct: 116 mg/dL — ABNORMAL HIGH (ref 0–99)

## 2023-04-17 LAB — URIC ACID: Uric Acid: 5.8 mg/dL (ref 3.0–7.2)

## 2023-04-17 MED ORDER — DICLOFENAC SODIUM 1 % EX GEL: 2.0000 g | Freq: Three times a day (TID) | CUTANEOUS | 0 refills | Status: DC | PRN

## 2023-04-17 NOTE — Telephone Encounter (Signed)
Sent in voltaren gel for painful joints/muscle to be applied TID prn.

## 2023-04-17 NOTE — Telephone Encounter (Signed)
Patient calls nurse line requesting a prescription for Voltaren Gel.   She reports she forgot to request this yesterday from PCP during visit.   I do not see this on her medication list.  Will forward to PCP.

## 2023-04-23 ENCOUNTER — Other Ambulatory Visit: Payer: Self-pay | Admitting: Family Medicine

## 2023-04-23 ENCOUNTER — Encounter (HOSPITAL_COMMUNITY): Payer: Self-pay | Admitting: Emergency Medicine

## 2023-04-23 ENCOUNTER — Ambulatory Visit (HOSPITAL_COMMUNITY)
Admission: EM | Admit: 2023-04-23 | Discharge: 2023-04-23 | Disposition: A | Discharge status: Home / Self Care | Payer: 59 | Attending: Physician Assistant | Admitting: Physician Assistant

## 2023-04-23 ENCOUNTER — Inpatient Hospital Stay: Admission: RE | Admit: 2023-04-23 | Payer: 59 | Source: Ambulatory Visit

## 2023-04-23 DIAGNOSIS — J069 Acute upper respiratory infection, unspecified: Secondary | ICD-10-CM

## 2023-04-23 DIAGNOSIS — R051 Acute cough: Secondary | ICD-10-CM | POA: Diagnosis not present

## 2023-04-23 DIAGNOSIS — R0981 Nasal congestion: Secondary | ICD-10-CM | POA: Diagnosis not present

## 2023-04-23 DIAGNOSIS — J3089 Other allergic rhinitis: Secondary | ICD-10-CM

## 2023-04-23 LAB — POC COVID19/FLU A&B COMBO
Covid Antigen, POC: NEGATIVE
Influenza A Antigen, POC: NEGATIVE
Influenza B Antigen, POC: NEGATIVE

## 2023-04-23 MED ORDER — BUDESONIDE-FORMOTEROL FUMARATE 160-4.5 MCG/ACT IN AERO: 2.0000 | INHALATION_SPRAY | Freq: Two times a day (BID) | RESPIRATORY_TRACT | 0 refills | Status: AC

## 2023-04-23 MED ORDER — PROMETHAZINE-DM 6.25-15 MG/5ML PO SYRP: 5.0000 mL | ORAL_SOLUTION | Freq: Two times a day (BID) | ORAL | 0 refills | Status: DC | PRN

## 2023-04-23 MED ORDER — FLUTICASONE PROPIONATE 50 MCG/ACT NA SUSP
1.0000 | Freq: Every day | NASAL | 0 refills | Status: DC
Start: 2023-04-23 — End: 2023-05-17

## 2023-04-23 NOTE — ED Provider Notes (Signed)
[facility]    CSN: [id number] Arrival date & time: 04/23/23  1648      History   Chief Complaint Chief Complaint  Patient presents with   Cough    HPI Alyssa Montgomery is a 62 y.o. female.   Patient presents today with a several day history of URI symptoms.  She reports cough, headache, chest congestion.  Denies any fever, chest pain, nausea, vomiting.  Reports that her grandson who she keeps regularly tested positive for influenza A last week and her husband tested positive earlier today.  She denies any recent antibiotics or steroids.  She does have a history of bronchitis but denies any formal diagnosis of COPD despite a history of smoking.  She quit smoking several years ago.  Denies formal diagnosis of asthma or allergies.  She does have an albuterol inhaler that she has been using intermittently.  She has not tried any over-the-counter medications for symptom management.    Past Medical History:  Diagnosis Date   Anxiety    Bilateral carpal tunnel syndrome 03/03/2018   Bronchitis    Cervical spondylosis without myelopathy 07/29/2018   Cervicalgia 11/16/2014   Cervicogenic headache 07/29/2018   Chronic bilateral low back pain with bilateral sciatica 09/18/2018   Claudication (HCC) 09/03/2016   Costochondritis 02/08/2022   Diverticulitis    FIBROIDS, UTERUS 03/09/2010   Qualifier: Diagnosis of  By: Leone Payor MD, Alfonse Ras E    Fibromyalgia    Fibromyalgia 06/18/2014   Gait abnormality 11/22/2020   GERD (gastroesophageal reflux disease)    Goiter 02/28/2013   Headache    Healthcare maintenance 04/17/2015   History of cervical dysplasia 01/18/2016   Hyperlipidemia    Hypertension associated with diabetes (HCC) 12/20/2019   Hypertension associated with diabetes (HCC) 12/20/2019   Left carpal tunnel syndrome 01/27/2018   Left foot pain 12/20/2019   Left hip pain 11/23/2019   Neck pain 11/22/2020   Neuropathy    Non-seasonal allergic rhinitis 07/13/2019    Pain in right ankle and joints of right foot 03/17/2019   Plantar fasciitis    Pre-operative clearance 08/12/2020   Primary osteoarthritis of left hip 09/12/2020   Right leg numbness 11/22/2020   Spinal stenosis of cervical region 07/29/2018   Substance abuse (HCC)    10 years ago ( cocaine)   TIA (transient ischemic attack) 08/10/2016   Tobacco abuse 01/18/2012   Tobacco use disorder 04/17/2015   Trochanteric bursitis of left hip 11/23/2019   Urinary frequency 11/30/2019   UTI (urinary tract infection) 06/19/2012   UTI (urinary tract infection) 06/19/2012   Viral URI with cough 02/28/2013    Patient Active Problem List   Diagnosis Date Noted   Leg cramps 02/06/2023   Abdominal bloating 02/06/2023   Need for shingles vaccine 11/14/2022   Screening for lung cancer 11/14/2022   Cough 11/07/2022   H/O total shoulder replacement, 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   Diverticulosis 07/06/2021   Morbid obesity (HCC) 04/27/2021   BMI 40.0-44.9, adult (HCC) 04/27/2021   Peripheral polyneuropathy 05/27/2020   Neuropathy 02/15/2020   Bilateral primary osteoarthritis of hip 02/10/2020   Foot pain, left 12/20/2019   Hyperlipidemia associated with type 2 diabetes mellitus (HCC) 11/14/2017   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   Other fatigue 08/10/2016   Diabetes mellitus without complication (HCC) 04/17/2015   History of tobacco use 04/17/2015   Complete tear of right rotator cuff 11/03/2014   Back pain 06/19/2012   GERD 12/26/2006    Past Surgical History:  Procedure Laterality Date   ANKLE SURGERY     BACK SURGERY     Bowel obstruction     CARPAL TUNNEL RELEASE     CERVICAL CONIZATION W/BX N/A 12/27/2015   Procedure: CONIZATION CERVIX WITH BIOPSY;  Surgeon: Ok Edwards, MD;  Location: [facility];  Service: Gynecology;  Laterality: N/A;   DILATATION & CURETTAGE/HYSTEROSCOPY WITH MYOSURE N/A 12/27/2015   Procedure: DILATATION & CURETTAGE/HYSTEROSCOPY WITH MYOSURE;  Surgeon: Ok Edwards, MD;  Location: [facility];  Service: Gynecology;  Laterality: N/A;   ECTOPIC PREGNANCY SURGERY     EYE SURGERY     removed right eye;    EYE SURGERY     REVERSE SHOULDER ARTHROPLASTY Left 05/11/2022   Procedure: REVERSE SHOULDER ARTHROPLASTY;  Surgeon: Beverely Low, MD;  Location: [facility];  Service: Orthopedics;  Laterality: Left;  with ISB   TOTAL HIP ARTHROPLASTY Left 09/12/2020   Procedure: TOTAL HIP ARTHROPLASTY ANTERIOR APPROACH;  Surgeon: Ollen Gross, MD;  Location: [facility];  Service: Orthopedics;  Laterality: Left;     OB History     Gravida  6   Para  2   Term  2   Preterm      AB  4   Living  2      SAB  2   IAB  1   Ectopic  1   Multiple      Live Births               Home Medications    Prior to Admission medications   Medication Sig Start Date End Date Taking? Authorizing Provider  budesonide-formoterol (SYMBICORT) 160-4.5 MCG/ACT inhaler Inhale 2 puffs into the lungs in the morning and at bedtime. 04/23/23  Yes Meklit Cotta K, PA-C  fluticasone (FLONASE) 50 MCG/ACT nasal spray Place 1 spray into both nostrils daily. 04/23/23  Yes Zavannah Deblois K, PA-C  promethazine-dextromethorphan (PROMETHAZINE-DM) 6.25-15 MG/5ML syrup Take 5 mLs by mouth 2 (two) times daily as needed for cough. 04/23/23  Yes Jemimah Cressy K, PA-C  albuterol (VENTOLIN HFA) 108 (90 Base) MCG/ACT inhaler INHALE 2 PUFFS INTO THE LUNGS EVERY 6 HOURS AS NEEDED FOR WHEEZING/SHORTNESS OF BREATH 01/22/23   Evette Georges, MD  bisacodyl (DULCOLAX) 5 MG EC tablet Take 5 mg by mouth daily as needed for moderate constipation.    [provider]  Cholecalciferol (VITAMIN D3 PO) Take 1 tablet by mouth daily.    [provider]  cyclobenzaprine (FLEXERIL) 10 MG tablet Take 1 tablet (10 mg total) by mouth 2 (two) times daily as needed for muscle spasms. 11/07/22   Carney Living, MD  diclofenac Sodium (VOLTAREN) 1 % GEL Apply 2 g topically 3 (three) times daily as  needed. Apply to affected joint or muscle. 04/17/23   Evette Georges, MD  ECHINACEA PO Take 1 capsule by mouth daily.    [provider]  famotidine (PEPCID) 20 MG tablet TAKE 1 TABLET BY MOUTH EVERY DAY 03/21/23   Evette Georges, MD  lansoprazole (PREVACID) 30 MG capsule Take 1 capsule (30 mg total) by mouth daily. 11/13/22   Valetta Close, MD  lidocaine (LIDODERM) 5 % Place 1 patch onto the skin daily as needed (pain). Remove & Discard patch within 12 hours or as directed by MD 11/07/22   Carney Living, MD  metFORMIN (GLUCOPHAGE-XR) 500 MG 24 hr tablet TAKE 2 TABLETS (1,000 MG TOTAL) BY MOUTH EVERY EVENING. 12/03/22   Valetta Close, MD  miconazole (  MICONAZOLE 7) 2 % vaginal cream Place 1 Applicatorful vaginally at bedtime. 04/04/22   Billey Co, MD  MORINGA OLEIFERA PO Take 1 tablet by mouth daily.    [provider]  promethazine (PHENERGAN) 12.5 MG tablet Take 1 tablet (12.5 mg total) by mouth every 6 (six) hours as needed for nausea, vomiting or refractory nausea / vomiting. 05/12/22   Beverely Low, MD  rosuvastatin (CRESTOR) 5 MG tablet Take 1 tablet (5 mg total) by mouth daily. Patient taking differently: Take 5 mg by mouth at bedtime. 12/15/21   Littie Deeds, MD  zinc gluconate 50 MG tablet Take 50 mg by mouth daily.    [provider]    Family History Family History  Problem Relation Age of Onset   Hypertension Mother    Arthritis Mother    Cancer Father        lung   Other Neg Hx        pituitary problem   Colon cancer Neg Hx    Colon polyps Neg Hx    Esophageal cancer Neg Hx    Rectal cancer Neg Hx    Stomach cancer Neg Hx     Social History Social History   Tobacco Use   Smoking status: Former    Current packs/day: 0.00    Average packs/day: 0.5 packs/day for 18.0 years (9.0 ttl pk-yrs)    Types: Cigarettes    Start date: 04/26/2001    Quit date: 04/27/2019    Years since quitting: 3.9    Passive exposure: Never   Smokeless  tobacco: Never   Tobacco comments:    1-800 number given   Vaping Use   Vaping status: Never Used  Substance Use Topics   Alcohol use: Yes    Comment: rare   Drug use: No    Comment: clean 10 years crack cocaine     Allergies   Zanaflex [tizanidine hcl], Penicillins, and Sulfonamide derivatives   Review of Systems Review of Systems  Constitutional:  Positive for activity change. Negative for appetite change, fatigue and fever.  HENT:  Positive for congestion. Negative for sneezing and sore throat.   Respiratory:  Positive for cough. Negative for shortness of breath.   Cardiovascular:  Negative for chest pain.  Gastrointestinal:  Negative for abdominal pain, diarrhea, nausea and vomiting.     Physical Exam Triage Vital Signs ED Triage Vitals  Encounter Vitals Group     BP 04/23/23 1834 (!) 151/91     Systolic BP Percentile --      Diastolic BP Percentile --      Pulse Rate 04/23/23 1834 79     Resp 04/23/23 1834 17     Temp 04/23/23 1834 98.7 F (37.1 C)     Temp Source 04/23/23 1834 Oral     SpO2 04/23/23 1834 96 %     Weight --      Height --      Head Circumference --      Peak Flow --      Pain Score 04/23/23 1836 10     Pain Loc --      Pain Education --      Exclude from Growth Chart --    No data found.  Updated Vital Signs BP (!) 151/91 (BP Location: Left Arm)   Pulse 79   Temp 98.7 F (37.1 C) (Oral)   Resp 17   SpO2 96%   Visual Acuity Right Eye Distance:   Left  Eye Distance:   Bilateral Distance:    Right Eye Near:   Left Eye Near:    Bilateral Near:     Physical Exam Vitals reviewed.  Constitutional:      General: She is awake. She is not in acute distress.    Appearance: Normal appearance. She is well-developed. She is not ill-appearing.     Comments: Very pleasant female appears stated age in no acute distress sitting comfortably in exam room  HENT:     Head: Normocephalic and atraumatic.     Right Ear: Tympanic membrane, ear  canal and external ear normal. Tympanic membrane is not erythematous or bulging.     Left Ear: Tympanic membrane, ear canal and external ear normal. Tympanic membrane is not erythematous or bulging.     Nose:     Right Sinus: No maxillary sinus tenderness or frontal sinus tenderness.     Left Sinus: No maxillary sinus tenderness or frontal sinus tenderness.     Mouth/Throat:     Pharynx: Uvula midline. No oropharyngeal exudate or posterior oropharyngeal erythema.  Cardiovascular:     Rate and Rhythm: Normal rate and regular rhythm.     Heart sounds: Normal heart sounds, S1 normal and S2 normal. No murmur heard. Pulmonary:     Effort: Pulmonary effort is normal.     Breath sounds: Examination of the right-lower field reveals decreased breath sounds. Examination of the left-lower field reveals decreased breath sounds. Decreased breath sounds present. No wheezing, rhonchi or rales.  Psychiatric:        Behavior: Behavior is cooperative.      UC Treatments / Results  Labs (all labs ordered are listed, but only abnormal results are displayed) Labs Reviewed  POC COVID19/FLU A&B COMBO    EKG   Radiology No results found.  Procedures Procedures (including critical care time)  Medications Ordered in UC Medications - No data to display  Initial Impression / Assessment and Plan / UC Course  I have reviewed the triage vital signs and the nursing notes.  Pertinent labs & imaging results that were available during my care of the patient were reviewed by me and considered in my medical decision making (see chart for details).     Visit was cut short as patient accompanied her grandson to his visit and he was being discharged to the emergency room.  Viral testing was obtained and was negative for flu and COVID.  Discussed she likely has a different virus or it is too early to test positive for influenza.  She was started on symptomatic treatment including Promethazine DM with instruction  of strep drink alcohol taking this medication.  She can use fluticasone nasal spray for additional symptom relief as well as over-the-counter medication such as Mucinex, Tylenol.  She does have a history of diabetes so we elected to avoid systemic steroids and will try Symbicort.  We discussed that if her symptoms are not improving within a few days or if she has any worsening symptoms she needs to be seen immediately.  Final Clinical Impressions(s) / UC Diagnoses   Final diagnoses:  Viral URI with cough  Nasal congestion  Acute cough   Discharge Instructions   None    ED Prescriptions     Medication Sig Dispense Auth. Provider   promethazine-dextromethorphan (PROMETHAZINE-DM) 6.25-15 MG/5ML syrup Take 5 mLs by mouth 2 (two) times daily as needed for cough. 118 mL Shakinah Navis K, PA-C   fluticasone (FLONASE) 50 MCG/ACT nasal spray Place  1 spray into both nostrils daily. 16 g Michaela Broski K, PA-C   budesonide-formoterol (SYMBICORT) 160-4.5 MCG/ACT inhaler Inhale 2 puffs into the lungs in the morning and at bedtime. 1 each Clovia Reine, Noberto Retort, PA-C      PDMP not reviewed this encounter.   Alyssa Hawking, PA-C 04/23/23 1925

## 2023-04-23 NOTE — ED Triage Notes (Signed)
Pt c/o cough, headache, and chest congestion since yesterday

## 2023-05-15 ENCOUNTER — Other Ambulatory Visit: Payer: Self-pay | Admitting: Family Medicine

## 2023-05-20 ENCOUNTER — Ambulatory Visit: Payer: 59 | Admitting: Student

## 2023-05-20 NOTE — Progress Notes (Unsigned)
    SUBJECTIVE:   CHIEF COMPLAINT / HPI: Back pain  Low back pain MRI in 2021 showed mild lumbar spondylosis and mild spinal canal stenosis at L3 and L4 and foraminal narrowing at L2 and L3.  Does have multilevel facet degenerative changes.  She had a lumbar fusion 25 years ago.  Currently using***.  Denies any dysuria***.  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  ***  ASSESSMENT/PLAN:   No problem-specific Assessment & Plan notes found for this encounter.     Levin Erp, MD [facility]

## 2023-05-21 ENCOUNTER — Ambulatory Visit
Admission: RE | Admit: 2023-05-21 | Discharge: 2023-05-21 | Disposition: A | Discharge status: Home / Self Care | Payer: 59 | Source: Ambulatory Visit | Attending: Family Medicine

## 2023-05-21 ENCOUNTER — Ambulatory Visit (INDEPENDENT_AMBULATORY_CARE_PROVIDER_SITE_OTHER): Payer: 59 | Admitting: Family Medicine

## 2023-05-21 VITALS — BP 167/89 | HR 74 | Ht 64.0 in | Wt 235.6 lb

## 2023-05-21 DIAGNOSIS — R350 Frequency of micturition: Secondary | ICD-10-CM | POA: Diagnosis not present

## 2023-05-21 DIAGNOSIS — R030 Elevated blood-pressure reading, without diagnosis of hypertension: Secondary | ICD-10-CM

## 2023-05-21 DIAGNOSIS — M25552 Pain in left hip: Secondary | ICD-10-CM | POA: Diagnosis not present

## 2023-05-21 LAB — POCT URINALYSIS DIP (MANUAL ENTRY)
Bilirubin, UA: NEGATIVE
Blood, UA: NEGATIVE
Glucose, UA: NEGATIVE mg/dL
Ketones, POC UA: NEGATIVE mg/dL
Leukocytes, UA: NEGATIVE
Nitrite, UA: NEGATIVE
Protein Ur, POC: NEGATIVE mg/dL
Spec Grav, UA: 1.02 (ref 1.010–1.025)
Urobilinogen, UA: 0.2 U/dL
pH, UA: 5.5 (ref 5.0–8.0)

## 2023-05-21 MED ORDER — DICLOFENAC SODIUM 1 % EX GEL
2.0000 g | Freq: Three times a day (TID) | CUTANEOUS | 0 refills | Status: DC | PRN
Start: 2023-05-21 — End: 2023-06-28

## 2023-05-21 NOTE — Progress Notes (Signed)
    SUBJECTIVE:   CHIEF COMPLAINT / HPI:   L hip and L lower back pain since thanksgiving Reports that she ate a lot and also had a small fall onto her knees during thanksgiving Since then, has been having worsening L hip pain. No shooting pain into leg but does report her L thigh is also sore She underwent L hip replacement 5 years ago Planning for R hip replacement in January She is planning to reach out to her ortho doctor to discuss her L hip as well She has been taking oxycodone for chronic pain (sees pain management), flexeril, and occasionally naproxen. Also uses voltaren gel and lido patch  MRI in 2021 showed mild lumbar spondylosis and mild spinal canal stenosis at L3 and L4 and foraminal narrowing at L2 and L3.  Does have multilevel facet degenerative changes.  She had a lumbar fusion 25 years ago.    Denies any dysuria, hematuria, abd pain, fevers, numbness/weakness, saddle anesthesia. Denies bowel/bladder incontinence   Endorses Urinary frequency x a couple of weeks No hematuria, dysuria, fevers, abd pain Would like to have her urine checked for UTI  PERTINENT  PMH / PSH: spinal stenosis, L hip replacement  OBJECTIVE:   BP (!) 167/89   Pulse 74   Ht 5\' 4"  (1.626 m)   Wt 235 lb 9.6 oz (106.9 kg)   SpO2 99%   BMI 40.44 kg/m   General: NAD, pleasant, able to participate in exam Respiratory: No respiratory distress Skin: warm and dry, no rashes noted Psych: Normal affect and mood  L hip, low back Inspection: No gross deformity, ecchymosis, swelling Palpation: Tender to palpation along left paraspinal muscles.  Tender to palpation along left lateral hip.  Soreness to palpation of left thigh ROM: Full range of motion though has some stiffness/limitation with hip flexion due to pain Strength: 5/5 in lower extremities bilaterally Special tests: Significant pain with FABER.  Negative FADIR.  Negative straight leg.  ASSESSMENT/PLAN:   Assessment & Plan Pain of left  hip In the setting of left hip replacement 5 years ago.  Possible traumatic episode during Thanksgiving which may have caused her symptoms.  Will obtain repeat imaging and encouraged follow-up with her orthopedist.  Advised continuation of current pain regimen.  Also placed PT referral and refilled Voltaren gel Urinary frequency UA negative today, no evidence of UTI, routine care, discussed return precautions Elevated BP without diagnosis of hypertension BP elevated today in the setting of acute pain, reassuringly has close follow-up scheduled with PCP later this month.  Does not appear to be on any antihypertensives currently   Vonna Drafts, MD [facility]

## 2023-05-21 NOTE — Patient Instructions (Addendum)
I have placed a new referral to physical therapy  Please visit  hospital to get your x ray  Please get in touch with your orthopedic doctor. Otherwise continue current pain medications  I will let you know if your urine shows any abnormalities

## 2023-05-23 ENCOUNTER — Telehealth: Payer: Self-pay

## 2023-05-23 ENCOUNTER — Other Ambulatory Visit: Payer: Self-pay | Admitting: Family Medicine

## 2023-05-23 ENCOUNTER — Telehealth: Payer: Self-pay | Admitting: Family Medicine

## 2023-05-23 DIAGNOSIS — Z122 Encounter for screening for malignant neoplasm of respiratory organs: Secondary | ICD-10-CM

## 2023-05-23 NOTE — Telephone Encounter (Signed)
-----   Message from [facility] sent at 05/23/2023  8:48 AM EST ----- Regarding: RE: PCP switch request Sounds good to me! ----- Message ----- From: Doreene Eland, MD Sent: 05/23/2023   7:19 AM EST To: Evette Georges, MD; Vonna Drafts, MD Subject: PCP switch request                             Hello Drs. Mahmood and Mabe,  Patient has been seeing Dr. Barb Merino and requested a switch to him. No complaints otherwise. Would you both be ok with the switch?  Dr. Lum Babe

## 2023-05-23 NOTE — Telephone Encounter (Signed)
Patient LVM on nurse line requesting results.   I attempted to call patient back to advise on imaging turn around time as her report is not back yet.   However, no answer.  Happy to speak with her when she calls back.

## 2023-05-23 NOTE — Telephone Encounter (Signed)
Drs. Barb Merino and Mabe agreed with the PCP switch request made by the patient.

## 2023-05-27 ENCOUNTER — Ambulatory Visit: Payer: 59 | Admitting: Family Medicine

## 2023-05-27 ENCOUNTER — Other Ambulatory Visit: Payer: Self-pay | Admitting: Family Medicine

## 2023-05-27 ENCOUNTER — Inpatient Hospital Stay: Admission: RE | Admit: 2023-05-27 | Payer: 59 | Source: Ambulatory Visit

## 2023-05-27 DIAGNOSIS — Z1231 Encounter for screening mammogram for malignant neoplasm of breast: Secondary | ICD-10-CM

## 2023-05-29 ENCOUNTER — Inpatient Hospital Stay
Admission: RE | Admit: 2023-05-29 | Discharge: 2023-05-29 | Discharge status: Home / Self Care | Payer: 59 | Source: Ambulatory Visit

## 2023-05-29 DIAGNOSIS — Z1231 Encounter for screening mammogram for malignant neoplasm of breast: Secondary | ICD-10-CM

## 2023-06-10 ENCOUNTER — Ambulatory Visit: Payer: 59 | Admitting: Family Medicine

## 2023-06-11 NOTE — Progress Notes (Addendum)
 Anesthesia Review:  PCP: Mahmood LOV 05/21/23  Cardiologist : none  Chest x-ray  10/04/22- 2 view  CT Chest- 09/30/22  EKG  04/16/23  Echo : Stress test: Cardiac Cath :  Activity level: can do a flight of stairs without difficulty  Sleep Study/ CPAP : none  Fasting Blood Sugar :      / Checks Blood Sugar -- times a day:   Blood Thinner/ Instructions /Last Dose: ASA / Instructions/ Last Dose :    DM- type2- does not check glucose at home Hgba1c- 06/17/23- 7.3 routed to Dr Aluiiso on 06/18/23  Metformin - none day of surgery    04/23/23- in ED with viral URI - still has occ cough per pt

## 2023-06-11 NOTE — Patient Instructions (Signed)
 SURGICAL WAITING ROOM VISITATION  Patients having surgery or a procedure may have no more than 2 support people in the waiting area - these visitors may rotate.    Children under the age of 68 must have an adult with them who is not the patient.  Due to an increase in RSV and influenza rates and associated hospitalizations, children ages 63 and under may not visit patients in [facility] hospitals.  If the patient needs to stay at the hospital during part of their recovery, the visitor guidelines for inpatient rooms apply. Pre-op nurse will coordinate an appropriate time for 1 support person to accompany patient in pre-op.  This support person may not rotate.    Please refer to the [facility] website for the visitor guidelines for Inpatients (after your surgery is over and you are in a regular room).       Your procedure is scheduled on:  06/26/2023    Report to [facility] Main Entrance    Report to admitting at   [phone]   Call this number if you have problems the morning of surgery [phone]   Do not eat food :After Midnight.   After Midnight you may have the following liquids until _ 0845_____ Am  DAY OF SURGERY  Water  Non-Citrus Juices (without pulp, NO RED-Apple, White grape, White cranberry) Black Coffee (NO MILK/CREAM OR CREAMERS, sugar ok)  Clear Tea (NO MILK/CREAM OR CREAMERS, sugar ok) regular and decaf                             Plain Jell-O (NO RED)                                           Fruit ices (not with fruit pulp, NO RED)                                     Popsicles (NO RED)                                                               Sports drinks like Gatorade (NO RED)                    The day of surgery:  Drink ONE (1) Pre-Surgery Clear Ensure or G2 at 0845 AM  ( have completed by ) the morning of surgery. Drink in one sitting. Do not sip.  This drink was given to you during your hospital  pre-op appointment visit. Nothing else to  drink after completing the  Pre-Surgery Clear Ensure or G2.          If you have questions, please contact your surgeon's office.       Oral Hygiene is also important to reduce your risk of infection.                                    Remember - BRUSH YOUR TEETH THE MORNING OF SURGERY WITH  YOUR REGULAR TOOTHPASTE  DENTURES WILL BE REMOVED PRIOR TO SURGERY PLEASE DO NOT APPLY Poly grip OR ADHESIVES!!!   Do NOT smoke after Midnight   Stop all vitamins and herbal supplements 7 days before surgery.   Take these medicines the morning of surgery with A SIP OF WATER :  inhalers as usual and bring, flonase , prevacid             Metformin - none day of surgery   DO NOT TAKE ANY ORAL DIABETIC MEDICATIONS DAY OF YOUR SURGERY  Bring CPAP mask and tubing day of surgery.                              You may not have any metal on your body including hair pins, jewelry, and body piercing             Do not wear make-up, lotions, powders, perfumes/cologne, or deodorant  Do not wear nail polish including gel and S&S, artificial/acrylic nails, or any other type of covering on natural nails including finger and toenails. If you have artificial nails, gel coating, etc. that needs to be removed by a nail salon please have this removed prior to surgery or surgery may need to be canceled/ delayed if the surgeon/ anesthesia feels like they are unable to be safely monitored.   Do not shave  48 hours prior to surgery.               Men may shave face and neck.   Do not bring valuables to the hospital. [facility] IS NOT             RESPONSIBLE   FOR VALUABLES.   Contacts, glasses, dentures or bridgework may not be worn into surgery.   Bring small overnight bag day of surgery.   DO NOT BRING YOUR HOME MEDICATIONS TO THE HOSPITAL. PHARMACY WILL DISPENSE MEDICATIONS LISTED ON YOUR MEDICATION LIST TO YOU DURING YOUR ADMISSION IN THE HOSPITAL!    Patients discharged on the day of surgery will not be  allowed to drive home.  Someone NEEDS to stay with you for the first 24 hours after anesthesia.   Special Instructions: Bring a copy of your healthcare power of attorney and living will documents the day of surgery if you haven't scanned them before.              Please read over the following fact sheets you were given: IF YOU HAVE QUESTIONS ABOUT YOUR PRE-OP INSTRUCTIONS PLEASE CALL [phone]   If you received a COVID test during your pre-op visit  it is requested that you wear a mask when out in public, stay away from anyone that may not be feeling well and notify your surgeon if you develop symptoms. If you test positive for Covid or have been in contact with anyone that has tested positive in the last 10 days please notify you surgeon.      Pre-operative 5 CHG Bath Instructions   You can play a key role in reducing the risk of infection after surgery. Your skin needs to be as free of germs as possible. You can reduce the number of germs on your skin by washing with CHG (chlorhexidine  gluconate) soap before surgery. CHG is an antiseptic soap that kills germs and continues to kill germs even after washing.   DO NOT use if you have an allergy to chlorhexidine /CHG or antibacterial soaps. If your skin becomes reddened or  irritated, stop using the CHG and notify one of our RNs at [phone].   Please shower with the CHG soap starting 4 days before surgery using the following schedule:     Please keep in mind the following:  DO NOT shave, including legs and underarms, starting the day of your first shower.   You may shave your face at any point before/day of surgery.  Place clean sheets on your bed the day you start using CHG soap. Use a clean washcloth (not used since being washed) for each shower. DO NOT sleep with pets once you start using the CHG.   CHG Shower Instructions:  If you choose to wash your hair and private area, wash first with your normal shampoo/soap.  After you use  shampoo/soap, rinse your hair and body thoroughly to remove shampoo/soap residue.  Turn the water  OFF and apply about 3 tablespoons (45 ml) of CHG soap to a CLEAN washcloth.  Apply CHG soap ONLY FROM YOUR NECK DOWN TO YOUR TOES (washing for 3-5 minutes)  DO NOT use CHG soap on face, private areas, open wounds, or sores.  Pay special attention to the area where your surgery is being performed.  If you are having back surgery, having someone wash your back for you may be helpful. Wait 2 minutes after CHG soap is applied, then you may rinse off the CHG soap.  Pat dry with a clean towel  Put on clean clothes/pajamas   If you choose to wear lotion, please use ONLY the CHG-compatible lotions on the back of this paper.     Additional instructions for the day of surgery: DO NOT APPLY any lotions, deodorants, cologne, or perfumes.   Put on clean/comfortable clothes.  Brush your teeth.  Ask your nurse before applying any prescription medications to the skin.      CHG Compatible Lotions   Aveeno Moisturizing lotion  Cetaphil Moisturizing Cream  Cetaphil Moisturizing Lotion  Clairol Herbal Essence Moisturizing Lotion, Dry Skin  Clairol Herbal Essence Moisturizing Lotion, Extra Dry Skin  Clairol Herbal Essence Moisturizing Lotion, Normal Skin  Curel Age Defying Therapeutic Moisturizing Lotion with Alpha Hydroxy  Curel Extreme Care Body Lotion  Curel Soothing Hands Moisturizing Hand Lotion  Curel Therapeutic Moisturizing Cream, Fragrance-Free  Curel Therapeutic Moisturizing Lotion, Fragrance-Free  Curel Therapeutic Moisturizing Lotion, Original Formula  Eucerin Daily Replenishing Lotion  Eucerin Dry Skin Therapy Plus Alpha Hydroxy Crme  Eucerin Dry Skin Therapy Plus Alpha Hydroxy Lotion  Eucerin Original Crme  Eucerin Original Lotion  Eucerin Plus Crme Eucerin Plus Lotion  Eucerin TriLipid Replenishing Lotion  Keri Anti-Bacterial Hand Lotion  Keri Deep Conditioning Original Lotion Dry  Skin Formula Softly Scented  Keri Deep Conditioning Original Lotion, Fragrance Free Sensitive Skin Formula  Keri Lotion Fast Absorbing Fragrance Free Sensitive Skin Formula  Keri Lotion Fast Absorbing Softly Scented Dry Skin Formula  Keri Original Lotion  Keri Skin Renewal Lotion Keri Silky Smooth Lotion  Keri Silky Smooth Sensitive Skin Lotion  Nivea Body Creamy Conditioning Oil  Nivea Body Extra Enriched Teacher, Adult Education Moisturizing Lotion Nivea Crme  Nivea Skin Firming Lotion  NutraDerm 30 Skin Lotion  NutraDerm Skin Lotion  NutraDerm Therapeutic Skin Cream  NutraDerm Therapeutic Skin Lotion  ProShield Protective Hand Cream  Provon moisturizing lotion

## 2023-06-17 ENCOUNTER — Other Ambulatory Visit: Payer: Self-pay

## 2023-06-17 ENCOUNTER — Encounter (HOSPITAL_COMMUNITY)
Admission: RE | Admit: 2023-06-17 | Discharge: 2023-06-17 | Disposition: A | Discharge status: Home / Self Care | Payer: 59 | Source: Ambulatory Visit | Attending: Orthopedic Surgery | Admitting: Orthopedic Surgery

## 2023-06-17 ENCOUNTER — Encounter (HOSPITAL_COMMUNITY): Payer: Self-pay

## 2023-06-17 VITALS — BP 157/87 | HR 64 | Temp 98.1°F | Resp 16 | Ht 65.0 in | Wt 233.0 lb

## 2023-06-17 DIAGNOSIS — J449 Chronic obstructive pulmonary disease, unspecified: Secondary | ICD-10-CM | POA: Insufficient documentation

## 2023-06-17 DIAGNOSIS — M797 Fibromyalgia: Secondary | ICD-10-CM | POA: Diagnosis not present

## 2023-06-17 DIAGNOSIS — F419 Anxiety disorder, unspecified: Secondary | ICD-10-CM | POA: Insufficient documentation

## 2023-06-17 DIAGNOSIS — Z01812 Encounter for preprocedural laboratory examination: Secondary | ICD-10-CM | POA: Diagnosis present

## 2023-06-17 DIAGNOSIS — Z87891 Personal history of nicotine dependence: Secondary | ICD-10-CM | POA: Insufficient documentation

## 2023-06-17 DIAGNOSIS — F1120 Opioid dependence, uncomplicated: Secondary | ICD-10-CM | POA: Diagnosis not present

## 2023-06-17 DIAGNOSIS — Z01818 Encounter for other preprocedural examination: Secondary | ICD-10-CM

## 2023-06-17 DIAGNOSIS — G8929 Other chronic pain: Secondary | ICD-10-CM | POA: Diagnosis not present

## 2023-06-17 DIAGNOSIS — E1151 Type 2 diabetes mellitus with diabetic peripheral angiopathy without gangrene: Secondary | ICD-10-CM | POA: Diagnosis not present

## 2023-06-17 DIAGNOSIS — K219 Gastro-esophageal reflux disease without esophagitis: Secondary | ICD-10-CM | POA: Diagnosis not present

## 2023-06-17 DIAGNOSIS — F1910 Other psychoactive substance abuse, uncomplicated: Secondary | ICD-10-CM | POA: Insufficient documentation

## 2023-06-17 DIAGNOSIS — E049 Nontoxic goiter, unspecified: Secondary | ICD-10-CM | POA: Insufficient documentation

## 2023-06-17 HISTORY — DX: Type 2 diabetes mellitus without complications: E11.9

## 2023-06-17 LAB — CBC
HCT: 38.3 % (ref 36.0–46.0)
Hemoglobin: 11.9 g/dL — ABNORMAL LOW (ref 12.0–15.0)
MCH: 26.3 pg (ref 26.0–34.0)
MCHC: 31.1 g/dL (ref 30.0–36.0)
MCV: 84.5 fL (ref 80.0–100.0)
Platelets: 237 10*3/uL (ref 150–400)
RBC: 4.53 MIL/uL (ref 3.87–5.11)
RDW: 14.9 % (ref 11.5–15.5)
WBC: 4.5 10*3/uL (ref 4.0–10.5)
nRBC: 0 % (ref 0.0–0.2)

## 2023-06-17 LAB — BASIC METABOLIC PANEL
Anion gap: 10 (ref 5–15)
BUN: 19 mg/dL (ref 8–23)
CO2: 24 mmol/L (ref 22–32)
Calcium: 9 mg/dL (ref 8.9–10.3)
Chloride: 104 mmol/L (ref 98–111)
Creatinine, Ser: 0.68 mg/dL (ref 0.44–1.00)
GFR, Estimated: >60 mL/min (ref >60)
Glucose, Bld: 190 mg/dL — ABNORMAL HIGH (ref 70–99)
Potassium: 3.4 mmol/L — ABNORMAL LOW (ref 3.5–5.1)
Sodium: 138 mmol/L (ref 135–145)

## 2023-06-17 LAB — SURGICAL PCR SCREEN
MRSA, PCR: NEGATIVE
Staphylococcus aureus: NEGATIVE

## 2023-06-17 LAB — GLUCOSE, CAPILLARY: Glucose-Capillary: 168 mg/dL — ABNORMAL HIGH (ref 70–99)

## 2023-06-18 ENCOUNTER — Encounter (HOSPITAL_COMMUNITY): Payer: Self-pay

## 2023-06-18 LAB — HEMOGLOBIN A1C
Hgb A1c MFr Bld: 7.3 % — ABNORMAL HIGH (ref 4.8–5.6)
Mean Plasma Glucose: 163 mg/dL

## 2023-06-18 NOTE — Anesthesia Preprocedure Evaluation (Addendum)
 Anesthesia Evaluation  Patient identified by MRN, date of birth, ID band Patient awake    Reviewed: Allergy & Precautions, NPO status , Patient's Chart, lab work & pertinent test results  History of Anesthesia Complications Negative for: history of anesthetic complications  Airway Mallampati: III  TM Distance: >3 FB Neck ROM: Full    Dental  (+) Missing   Pulmonary former smoker   Pulmonary exam normal        Cardiovascular negative cardio ROS Normal cardiovascular exam     Neuro/Psych  Headaches PSYCHIATRIC DISORDERS Anxiety     TIA Neuromuscular disease    GI/Hepatic ,GERD  Medicated and Controlled,,(+)     substance abuse  cocaine use  Endo/Other  diabetes, Type 2, Oral Hypoglycemic Agents   Obesity   Renal/GU negative Renal ROS     Musculoskeletal  (+) Arthritis ,  Fibromyalgia -, narcotic dependent  Abdominal   Peds  Hematology negative hematology ROS (+)  Plt 237k    Anesthesia Other Findings   Reproductive/Obstetrics                             Anesthesia Physical Anesthesia Plan  ASA: 3  Anesthesia Plan: Spinal   Post-op Pain Management: Ofirmev  IV (intra-op)* and Ketamine  IV*   Induction:   PONV Risk Score and Plan: 2 and Treatment may vary due to age or medical condition and Propofol  infusion  Airway Management Planned: Natural Airway and Simple Face Mask  Additional Equipment: None  Intra-op Plan:   Post-operative Plan:   Informed Consent: I have reviewed the patients History and Physical, chart, labs and discussed the procedure including the risks, benefits and alternatives for the proposed anesthesia with the patient or authorized representative who has indicated his/her understanding and acceptance.       Plan Discussed with: CRNA and Anesthesiologist  Anesthesia Plan Comments: (See PAT note from 1/6)        Anesthesia Quick Evaluation

## 2023-06-18 NOTE — H&P (Signed)
 TOTAL HIP ADMISSION H&P  Patient is admitted for right total hip arthroplasty.  Subjective:  Chief Complaint: Right hip pain  HPI: Alyssa Montgomery, 63 y.o. female, has a history of pain and functional disability in the right hip due to arthritis and patient has failed non-surgical conservative treatments for greater than 12 weeks to include NSAID's and/or analgesics, use of assistive devices, weight reduction as appropriate, and activity modification. Onset of symptoms was gradual, starting  several  years ago with gradually worsening course since that time. The patient noted no past surgery on the right hip. Patient currently rates pain in the right hip at 7 out of 10 with activity. Patient has night pain, worsening of pain with activity and weight bearing, and trendelenberg gait. Patient has evidence of  severe bone-on-bone arthritis with pincer impingement, protrusio, and a large lateral marginal osteophyte  by imaging studies. This condition presents safety issues increasing the risk of falls. There is no current active infection.  Patient Active Problem List   Diagnosis Date Noted   Leg cramps 02/06/2023   Abdominal bloating 02/06/2023   Need for shingles vaccine 11/14/2022   Screening for lung cancer 11/14/2022   Cough 11/07/2022   H/O total shoulder replacement, 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   Diverticulosis 07/06/2021   Morbid obesity (HCC) 04/27/2021   BMI 40.0-44.9, adult (HCC) 04/27/2021   Peripheral polyneuropathy 05/27/2020   Neuropathy 02/15/2020   Bilateral primary osteoarthritis of hip 02/10/2020   Foot pain, left 12/20/2019   Hyperlipidemia associated with type 2 diabetes mellitus (HCC) 11/14/2017   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   Other fatigue 08/10/2016   Diabetes mellitus without complication (HCC) 04/17/2015   History of tobacco use 04/17/2015   Complete tear of right rotator cuff 11/03/2014   Back pain 06/19/2012   GERD 12/26/2006    Past Medical History:   Diagnosis Date   Anxiety    Bilateral carpal tunnel syndrome 03/03/2018   Bronchitis    Cervical spondylosis without myelopathy 07/29/2018   Cervicalgia 11/16/2014   Cervicogenic headache 07/29/2018   Chronic bilateral low back pain with bilateral sciatica 09/18/2018   Claudication (HCC) 09/03/2016   Costochondritis 02/08/2022   Diabetes mellitus without complication (HCC)    Diverticulitis    FIBROIDS, UTERUS 03/09/2010   Qualifier: Diagnosis of  By: Avram MD, NOLIA Pitts E    Fibromyalgia 06/18/2014   Gait abnormality 11/22/2020   GERD (gastroesophageal reflux disease)    Goiter 02/28/2013   Headache    History of cervical dysplasia 01/18/2016   Hyperlipidemia    Left carpal tunnel syndrome 01/27/2018   Left foot pain 12/20/2019   Left hip pain 11/23/2019   Neck pain 11/22/2020   Neuropathy    Non-seasonal allergic rhinitis 07/13/2019   Pain in right ankle and joints of right foot 03/17/2019   Plantar fasciitis    Primary osteoarthritis of left hip 09/12/2020   Right leg numbness 11/22/2020   Spinal stenosis of cervical region 07/29/2018   Substance abuse (HCC)    10 years ago ( cocaine)   TIA (transient ischemic attack) 08/10/2016   Tobacco use disorder 04/17/2015   Trochanteric bursitis of left hip 11/23/2019   Urinary frequency 11/30/2019   UTI (urinary tract infection) 06/19/2012   Viral URI with cough 02/28/2013    Past Surgical History:  Procedure Laterality Date   ANKLE SURGERY     BACK SURGERY     Bowel obstruction     CARPAL TUNNEL RELEASE     CERVICAL CONIZATION  W/BX N/A 12/27/2015   Procedure: CONIZATION CERVIX WITH BIOPSY;  Surgeon: Curlee VEAR Guan, MD;  Location: [facility];  Service: Gynecology;  Laterality: N/A;   DILATATION & CURETTAGE/HYSTEROSCOPY WITH MYOSURE N/A 12/27/2015   Procedure: DILATATION & CURETTAGE/HYSTEROSCOPY WITH MYOSURE;  Surgeon: Curlee VEAR Guan, MD;  Location: [facility];  Service: Gynecology;  Laterality: N/A;   ECTOPIC PREGNANCY  SURGERY     EYE SURGERY     removed right eye;    EYE SURGERY     REVERSE SHOULDER ARTHROPLASTY Left 05/11/2022   Procedure: REVERSE SHOULDER ARTHROPLASTY;  Surgeon: Kay Kemps, MD;  Location: [facility];  Service: Orthopedics;  Laterality: Left;  with ISB   TOTAL HIP ARTHROPLASTY Left 09/12/2020   Procedure: TOTAL HIP ARTHROPLASTY ANTERIOR APPROACH;  Surgeon: Melodi Lerner, MD;  Location: [facility];  Service: Orthopedics;  Laterality: Left;     Prior to Admission medications   Medication Sig Start Date End Date Taking? Authorizing Provider  albuterol  (VENTOLIN  HFA) 108 (90 Base) MCG/ACT inhaler INHALE 2 PUFFS INTO THE LUNGS EVERY 6 HOURS AS NEEDED FOR WHEEZING/SHORTNESS OF BREATH 01/22/23  Yes Mabe, Elna, MD  budesonide -formoterol  (SYMBICORT ) 160-4.5 MCG/ACT inhaler Inhale 2 puffs into the lungs in the morning and at bedtime. 04/23/23  Yes Raspet, Erin K, PA-C  Cholecalciferol (VITAMIN D3 PO) Take 1 tablet by mouth daily.   Yes [provider]  cyclobenzaprine  (FLEXERIL ) 10 MG tablet Take 1 tablet (10 mg total) by mouth 2 (two) times daily as needed for muscle spasms. 11/07/22  Yes Chambliss, Layman CROME, MD  ECHINACEA PO Take 1 capsule by mouth daily.   Yes [provider]  ELDERBERRY PO Take 1 capsule by mouth daily.   Yes [provider]  fluticasone  (FLONASE ) 50 MCG/ACT nasal spray SPRAY 1 SPRAY INTO BOTH NOSTRILS DAILY. 05/17/23  Yes Mabe, Elna, MD  lansoprazole  (PREVACID ) 30 MG capsule Take 1 capsule (30 mg total) by mouth daily. 11/13/22  Yes Macario Dorothyann HERO, MD  latanoprost (XALATAN) 0.005 % ophthalmic solution Place 1 drop into the left eye at bedtime. 04/19/23  Yes [provider]  lidocaine  (LIDODERM ) 5 % Place 1 patch onto the skin daily as needed (pain). Remove & Discard patch within 12 hours or as directed by MD 11/07/22  Yes Chambliss, Layman CROME, MD  Menthol -Camphor (TIGER BALM ARTHRITIS RUB EX) Apply topically.   Yes [provider]   metFORMIN  (GLUCOPHAGE -XR) 500 MG 24 hr tablet TAKE 2 TABLETS (1,000 MG TOTAL) BY MOUTH EVERY EVENING. 12/03/22  Yes Macario Dorothyann HERO, MD  MORINGA OLEIFERA PO Take 1 tablet by mouth daily.   Yes [provider]  multivitamin-lutein (OCUVITE-LUTEIN) CAPS capsule Take 1 capsule by mouth daily.   Yes [provider]  naproxen  (NAPROSYN ) 500 MG tablet Take 500 mg by mouth 2 (two) times daily as needed for moderate pain (pain score 4-6). 08/21/22  Yes [provider]  Oxycodone  HCl 10 MG TABS Take 10 mg by mouth every 6 (six) hours as needed (pain). 05/12/22  Yes [provider]  promethazine  (PHENERGAN ) 12.5 MG tablet Take 1 tablet (12.5 mg total) by mouth every 6 (six) hours as needed for nausea, vomiting or refractory nausea / vomiting. 05/12/22  Yes Kay Kemps, MD  zinc  gluconate 50 MG tablet Take 50 mg by mouth daily.   Yes [provider]  diclofenac  Sodium (VOLTAREN ) 1 % GEL Apply 2 g topically 3 (three) times daily as needed. Apply to affected joint or muscle. Patient not  taking: Reported on 06/06/2023 05/21/23   Romelle Booty, MD  famotidine  (PEPCID ) 20 MG tablet TAKE 1 TABLET BY MOUTH EVERY DAY Patient not taking: Reported on 06/06/2023 03/21/23   Tharon Lung, MD  miconazole  (MICONAZOLE  7) 2 % vaginal cream Place 1 Applicatorful vaginally at bedtime. Patient not taking: Reported on 06/06/2023 04/04/22   Donzetta Rollene BRAVO, MD  promethazine -dextromethorphan  (PROMETHAZINE -DM) 6.25-15 MG/5ML syrup Take 5 mLs by mouth 2 (two) times daily as needed for cough. Patient not taking: Reported on 06/06/2023 04/23/23   Raspet, Erin K, PA-C  rosuvastatin  (CRESTOR ) 5 MG tablet Take 1 tablet (5 mg total) by mouth daily. Patient not taking: Reported on 06/06/2023 12/15/21   Austin Ade, MD    Allergies  Allergen Reactions   Zanaflex  [Tizanidine  Hcl] Other (See Comments)    Syncope   Penicillins Other (See Comments)    Unknown childhood reaction. Has patient  had a PCN reaction causing immediate rash, facial/tongue/throat swelling, SOB or lightheadedness with hypotension: Unknown Has patient had a PCN reaction causing severe rash involving mucus membranes or skin necrosis: Unknown Has patient had a PCN reaction that required hospitalization: Unknown Has patient had a PCN reaction occurring within the last 10 years: No  Tolerated Cephalosporin Date: 09/14/20.   Sulfonamide Derivatives Itching    Social History   Socioeconomic History   Marital status: Married    Spouse name: Not on file   Number of children: 2   Years of education: some college   Highest education level: Not on file  Occupational History   Occupation: CNA/med tech  Tobacco Use   Smoking status: Former    Current packs/day: 0.00    Average packs/day: 0.5 packs/day for 18.0 years (9.0 ttl pk-yrs)    Types: Cigarettes    Start date: 04/26/2001    Quit date: 04/27/2019    Years since quitting: 4.1    Passive exposure: Never   Smokeless tobacco: Never   Tobacco comments:    1-800 number given   Vaping Use   Vaping status: Never Used  Substance and Sexual Activity   Alcohol  use: Yes    Comment: occ wine   Drug use: No    Comment: hx of cocaine 22 years ago   Sexual activity: Yes    Partners: Male    Birth control/protection: Post-menopausal  Other Topics Concern   Not on file  Social History Narrative   Lives with husband and mother (caregiver for her mother).   Right-handed.   One soda daily.   Social Drivers of Corporate Investment Banker Strain: Low Risk  (02/26/2023)   Overall Financial Resource Strain (CARDIA)    Difficulty of Paying Living Expenses: Not hard at all  Food Insecurity: No Food Insecurity (02/26/2023)   Hunger Vital Sign    Worried About Running Out of Food in the Last Year: Never true    Ran Out of Food in the Last Year: Never true  Transportation Needs: No Transportation Needs (02/26/2023)   PRAPARE - Scientist, Research (physical Sciences) (Medical): No    Lack of Transportation (Non-Medical): No  Physical Activity: Insufficiently Active (02/26/2023)   Exercise Vital Sign    Days of Exercise per Week: 4 days    Minutes of Exercise per Session: 20 min  Stress: No Stress Concern Present (02/26/2023)   [employer] of Occupational Health - Occupational Stress Questionnaire    Feeling of Stress : Not at all  Social Connections: Moderately Integrated (02/26/2023)  Social Connection and Isolation Panel [NHANES]    Frequency of Communication with Friends and Family: Three times a week    Frequency of Social Gatherings with Friends and Family: Once a week    Attends Religious Services: More than 4 times per year    Active Member of [employer] or Organizations: Yes    Attends Banker Meetings: More than 4 times per year    Marital Status: Widowed  Intimate Partner Violence: Not At Risk (02/26/2023)   Humiliation, Afraid, Rape, and Kick questionnaire    Fear of Current or Ex-Partner: No    Emotionally Abused: No    Physically Abused: No    Sexually Abused: No    Tobacco Use: Medium Risk (06/17/2023)   Patient History    Smoking Tobacco Use: Former    Smokeless Tobacco Use: Never    Passive Exposure: Never   Social History   Substance and Sexual Activity  Alcohol  Use Yes   Comment: occ wine    Family History  Problem Relation Age of Onset   Hypertension Mother    Arthritis Mother    Cancer Father        lung   Other Neg Hx        pituitary problem   Colon cancer Neg Hx    Colon polyps Neg Hx    Esophageal cancer Neg Hx    Rectal cancer Neg Hx    Stomach cancer Neg Hx     ROS   Objective:  Physical Exam: Well nourished and well developed.  General: Alert and oriented x3, cooperative and pleasant, no acute distress.  Head: normocephalic, atraumatic, neck supple.  Eyes: EOMI. Abdomen: non-tender to palpation and soft, normoactive bowel sounds. Musculoskeletal: - Evaluation of  her right hip shows Flexion to 100 degrees, no internal rotation, only 10-20 degrees of external rotation and 20 degrees of abduction.  - Significant antalgic gait pattern on the right. Calves soft and nontender. Motor function intact in LE. Strength 5/5 LE bilaterally. Neuro: Distal pulses 2+. Sensation to light touch intact in LE.  Vital signs in last 24 hours: Temp:  [98.1 F (36.7 C)] 98.1 F (36.7 C) (01/06 1458) Pulse Rate:  [64] 64 (01/06 1458) Resp:  [16] 16 (01/06 1458) BP: (157)/(87) 157/87 (01/06 1458) SpO2:  [100 %] 100 % (01/06 1458) Weight:  [105.7 kg] 105.7 kg (01/06 1458)  Imaging Review Plain radiographs demonstrate severe degenerative joint disease of the right hip. The bone quality appears to be adequate for age and reported activity level.  Assessment/Plan:  End stage arthritis, right hip  The patient history, physical examination, clinical judgement of the provider and imaging studies are consistent with end stage degenerative joint disease of the right hip and total hip arthroplasty is deemed medically necessary. The treatment options including medical management, injection therapy, arthroscopy and arthroplasty were discussed at length. The risks and benefits of total hip arthroplasty were presented and reviewed. The risks due to aseptic loosening, infection, stiffness, dislocation/subluxation, thromboembolic complications and other imponderables were discussed. The patient acknowledged the explanation, agreed to proceed with the plan and consent was signed. Patient is being admitted for inpatient treatment for surgery, pain control, PT, OT, prophylactic antibiotics, VTE prophylaxis, progressive ambulation and ADLs and discharge planning.The patient is planning to be discharged  home .  Therapy Plans: HEP Disposition: Home with Husband Planned DVT Prophylaxis: Aspirin  81 mg BID DME Needed: RW PCP: (clearance received) TXA: IV Allergies: penicillins (childhood rxn),  sulfa (itching)  Anesthesia Concerns: would like to avoid nerve block BMI: 38.8 Last HgbA1c: 7.3% (04/2023)  Pharmacy: [facility] ([address])  Other: -Taking oxycodone  10 mg BID for chronic pain -Prefers cyclobenzaprine  vs methocarbamol  for muscle relaxer -Phenergan  rather than zofran  for nausea  - Patient was instructed on what medications to stop prior to surgery. - Follow-up visit in 2 weeks with Dr. Melodi - Begin physical therapy following surgery - Pre-operative lab work as pre-surgical testing - Prescriptions will be provided in hospital at time of discharge  R. Zelda Kobs, PA-C Orthopedic Surgery [facility] [place]

## 2023-06-18 NOTE — Progress Notes (Signed)
 DISCUSSION: Alyssa Montgomery is a 63 yo female who presents to PAT prior to right THA on 06/26/23 with Dr. Melodi. PMH of former smoking (quit 2020), COPD, GERD, DM, goiter, fibromyalgia, arthritis, chronic pain with narcotic dependence, hx of substance abuse (cocaine remotely), anxiety.  Patient follows with PCP. Last seen on 05/21/23 for exacerbation of chronic back/hip pain. She uses inhalers for COPD and undergoes annual CT chest for lung cancer screening. Take Metformin  for diabetes. A1c was 7.3 on screening labs. Has chronic pain and takes Oxycodone , muscle relaxers.  VS: BP (!) 157/87   Pulse 64   Temp 36.7 C (Oral)   Resp 16   Ht 5' 5 (1.651 m)   Wt 105.7 kg   SpO2 100%   BMI 38.77 kg/m   PROVIDERS: Romelle Booty, MD   LABS: Labs reviewed: Acceptable for surgery. (all labs ordered are listed, but only abnormal results are displayed)  Labs Reviewed  HEMOGLOBIN A1C - Abnormal; Notable for the following components:      Result Value   Hgb A1c MFr Bld 7.3 (*)    All other components within normal limits  CBC - Abnormal; Notable for the following components:   Hemoglobin 11.9 (*)    All other components within normal limits  BASIC METABOLIC PANEL - Abnormal; Notable for the following components:   Potassium 3.4 (*)    Glucose, Bld 190 (*)    All other components within normal limits  GLUCOSE, CAPILLARY - Abnormal; Notable for the following components:   Glucose-Capillary 168 (*)    All other components within normal limits  SURGICAL PCR SCREEN     IMAGES:  CT Chest 04/20/2022:  IMPRESSION: 1. Lung-RADS 1, negative. Continue annual screening with low-dose chest CT without contrast in 12 months. 2.  Emphysema. ([id number])  EKG 04/16/23:  Normal sinus rhythm, rate 67 Possible Left atrial enlargement Left axis deviation Incomplete right bundle branch block Left ventricular hypertrophy ( R in aVL , Cornell product , Romhilt-Estes ) Cannot rule out Septal infarct ,  age undetermined   CV:  Past Medical History:  Diagnosis Date   Anxiety    Bilateral carpal tunnel syndrome 03/03/2018   Bronchitis    Cervical spondylosis without myelopathy 07/29/2018   Cervicalgia 11/16/2014   Cervicogenic headache 07/29/2018   Chronic bilateral low back pain with bilateral sciatica 09/18/2018   Claudication (HCC) 09/03/2016   Costochondritis 02/08/2022   Diabetes mellitus without complication (HCC)    Diverticulitis    FIBROIDS, UTERUS 03/09/2010   Qualifier: Diagnosis of  By: Avram MD, NOLIA Pitts E    Fibromyalgia    Fibromyalgia 06/18/2014   Gait abnormality 11/22/2020   GERD (gastroesophageal reflux disease)    Goiter 02/28/2013   Headache    Healthcare maintenance 04/17/2015   History of cervical dysplasia 01/18/2016   Hyperlipidemia    Left carpal tunnel syndrome 01/27/2018   Left foot pain 12/20/2019   Left hip pain 11/23/2019   Neck pain 11/22/2020   Neuropathy    Non-seasonal allergic rhinitis 07/13/2019   Pain in right ankle and joints of right foot 03/17/2019   Plantar fasciitis    Pre-operative clearance 08/12/2020   Primary osteoarthritis of left hip 09/12/2020   Right leg numbness 11/22/2020   Spinal stenosis of cervical region 07/29/2018   Substance abuse (HCC)    10 years ago ( cocaine)   TIA (transient ischemic attack) 08/10/2016   Tobacco abuse 01/18/2012   Tobacco use disorder 04/17/2015   Trochanteric bursitis of  left hip 11/23/2019   Urinary frequency 11/30/2019   UTI (urinary tract infection) 06/19/2012   Viral URI with cough 02/28/2013    Past Surgical History:  Procedure Laterality Date   ANKLE SURGERY     BACK SURGERY     Bowel obstruction     CARPAL TUNNEL RELEASE     CERVICAL CONIZATION W/BX N/A 12/27/2015   Procedure: CONIZATION CERVIX WITH BIOPSY;  Surgeon: Curlee VEAR Guan, MD;  Location: [facility];  Service: Gynecology;  Laterality: N/A;   DILATATION & CURETTAGE/HYSTEROSCOPY WITH MYOSURE N/A 12/27/2015    Procedure: DILATATION & CURETTAGE/HYSTEROSCOPY WITH MYOSURE;  Surgeon: Curlee VEAR Guan, MD;  Location: [facility];  Service: Gynecology;  Laterality: N/A;   ECTOPIC PREGNANCY SURGERY     EYE SURGERY     removed right eye;    EYE SURGERY     REVERSE SHOULDER ARTHROPLASTY Left 05/11/2022   Procedure: REVERSE SHOULDER ARTHROPLASTY;  Surgeon: Kay Kemps, MD;  Location: [facility];  Service: Orthopedics;  Laterality: Left;  with ISB   TOTAL HIP ARTHROPLASTY Left 09/12/2020   Procedure: TOTAL HIP ARTHROPLASTY ANTERIOR APPROACH;  Surgeon: Melodi Lerner, MD;  Location: [facility];  Service: Orthopedics;  Laterality: Left;     MEDICATIONS:  albuterol  (VENTOLIN  HFA) 108 (90 Base) MCG/ACT inhaler   budesonide -formoterol  (SYMBICORT ) 160-4.5 MCG/ACT inhaler   Cholecalciferol (VITAMIN D3 PO)   cyclobenzaprine  (FLEXERIL ) 10 MG tablet   diclofenac  Sodium (VOLTAREN ) 1 % GEL   ECHINACEA PO   ELDERBERRY PO   famotidine  (PEPCID ) 20 MG tablet   fluticasone  (FLONASE ) 50 MCG/ACT nasal spray   lansoprazole  (PREVACID ) 30 MG capsule   latanoprost (XALATAN) 0.005 % ophthalmic solution   lidocaine  (LIDODERM ) 5 %   Menthol -Camphor (TIGER BALM ARTHRITIS RUB EX)   metFORMIN  (GLUCOPHAGE -XR) 500 MG 24 hr tablet   miconazole  (MICONAZOLE  7) 2 % vaginal cream   MORINGA OLEIFERA PO   multivitamin-lutein (OCUVITE-LUTEIN) CAPS capsule   naproxen  (NAPROSYN ) 500 MG tablet   Oxycodone  HCl 10 MG TABS   promethazine  (PHENERGAN ) 12.5 MG tablet   promethazine -dextromethorphan  (PROMETHAZINE -DM) 6.25-15 MG/5ML syrup   rosuvastatin  (CRESTOR ) 5 MG tablet   zinc  gluconate 50 MG tablet   No current facility-administered medications for this encounter.   Burnard CHRISTELLA Odis DEVONNA [facility] Surgical Short Stay/Anesthesiology [facility] Phone [phone] 06/18/2023 12:34 PM

## 2023-06-19 ENCOUNTER — Ambulatory Visit: Payer: 59 | Admitting: Family Medicine

## 2023-06-26 ENCOUNTER — Ambulatory Visit (HOSPITAL_COMMUNITY): Payer: 59 | Admitting: Anesthesiology

## 2023-06-26 ENCOUNTER — Observation Stay (HOSPITAL_COMMUNITY): Payer: 59

## 2023-06-26 ENCOUNTER — Ambulatory Visit (HOSPITAL_COMMUNITY): Payer: 59 | Admitting: Medical

## 2023-06-26 ENCOUNTER — Encounter (HOSPITAL_COMMUNITY): Payer: Self-pay | Admitting: Orthopedic Surgery

## 2023-06-26 ENCOUNTER — Other Ambulatory Visit: Payer: Self-pay

## 2023-06-26 ENCOUNTER — Observation Stay (HOSPITAL_COMMUNITY)
Admission: RE | Admit: 2023-06-26 | Discharge: 2023-06-28 | Disposition: A | Discharge status: Home / Self Care | Payer: 59 | Source: Ambulatory Visit | Attending: Orthopedic Surgery | Admitting: Orthopedic Surgery

## 2023-06-26 ENCOUNTER — Encounter (HOSPITAL_COMMUNITY)
Admission: RE | Disposition: A | Discharge status: Home / Self Care | Payer: Self-pay | Source: Ambulatory Visit | Attending: Orthopedic Surgery

## 2023-06-26 ENCOUNTER — Ambulatory Visit (HOSPITAL_COMMUNITY): Payer: 59

## 2023-06-26 DIAGNOSIS — Z96612 Presence of left artificial shoulder joint: Secondary | ICD-10-CM | POA: Insufficient documentation

## 2023-06-26 DIAGNOSIS — Z96642 Presence of left artificial hip joint: Secondary | ICD-10-CM | POA: Diagnosis not present

## 2023-06-26 DIAGNOSIS — F419 Anxiety disorder, unspecified: Secondary | ICD-10-CM | POA: Diagnosis not present

## 2023-06-26 DIAGNOSIS — Z7984 Long term (current) use of oral hypoglycemic drugs: Secondary | ICD-10-CM | POA: Insufficient documentation

## 2023-06-26 DIAGNOSIS — E119 Type 2 diabetes mellitus without complications: Secondary | ICD-10-CM | POA: Insufficient documentation

## 2023-06-26 DIAGNOSIS — Z01818 Encounter for other preprocedural examination: Secondary | ICD-10-CM

## 2023-06-26 DIAGNOSIS — Z79899 Other long term (current) drug therapy: Secondary | ICD-10-CM | POA: Diagnosis not present

## 2023-06-26 DIAGNOSIS — M1611 Unilateral primary osteoarthritis, right hip: Secondary | ICD-10-CM

## 2023-06-26 DIAGNOSIS — Z8673 Personal history of transient ischemic attack (TIA), and cerebral infarction without residual deficits: Secondary | ICD-10-CM | POA: Insufficient documentation

## 2023-06-26 DIAGNOSIS — Z87891 Personal history of nicotine dependence: Secondary | ICD-10-CM | POA: Insufficient documentation

## 2023-06-26 DIAGNOSIS — M169 Osteoarthritis of hip, unspecified: Principal | ICD-10-CM | POA: Diagnosis present

## 2023-06-26 DIAGNOSIS — E785 Hyperlipidemia, unspecified: Secondary | ICD-10-CM

## 2023-06-26 HISTORY — PX: TOTAL HIP ARTHROPLASTY: SHX124

## 2023-06-26 LAB — GLUCOSE, CAPILLARY
Glucose-Capillary: 129 mg/dL — ABNORMAL HIGH (ref 70–99)
Glucose-Capillary: 108 mg/dL — ABNORMAL HIGH (ref 70–99)
Glucose-Capillary: 183 mg/dL — ABNORMAL HIGH (ref 70–99)
Glucose-Capillary: 274 mg/dL — ABNORMAL HIGH (ref 70–99)

## 2023-06-26 LAB — TYPE AND SCREEN
ABO/RH(D): A POS
Antibody Screen: NEGATIVE

## 2023-06-26 SURGERY — ARTHROPLASTY, HIP, TOTAL, ANTERIOR APPROACH
Anesthesia: Spinal | Site: Hip | Laterality: Right

## 2023-06-26 MED ORDER — CYCLOBENZAPRINE HCL 10 MG PO TABS
10.0000 mg | ORAL_TABLET | Freq: Three times a day (TID) | ORAL | Status: DC | PRN
  Administered 2023-06-26 – 2023-06-28 (×7): 10 mg via ORAL
  Filled 2023-06-26 (×8): qty 1

## 2023-06-26 MED ORDER — BUPIVACAINE-EPINEPHRINE (PF) 0.25% -1:200000 IJ SOLN
INTRAMUSCULAR | Status: DC | PRN
  Administered 2023-06-26: 30 mL via PERINEURAL

## 2023-06-26 MED ORDER — ONDANSETRON HCL 4 MG/2ML IJ SOLN
INTRAMUSCULAR | Status: AC
  Filled 2023-06-26: qty 2

## 2023-06-26 MED ORDER — LIDOCAINE HCL (CARDIAC) PF 100 MG/5ML IV SOSY
PREFILLED_SYRINGE | INTRAVENOUS | Status: DC | PRN
  Administered 2023-06-26: 40 mg via INTRAVENOUS

## 2023-06-26 MED ORDER — CEFAZOLIN SODIUM-DEXTROSE 2-4 GM/100ML-% IV SOLN
2.0000 g | INTRAVENOUS | Status: AC
  Administered 2023-06-26: 2 g via INTRAVENOUS
  Filled 2023-06-26: qty 100

## 2023-06-26 MED ORDER — FLEET ENEMA RE ENEM
1.0000 | ENEMA | Freq: Once | RECTAL | Status: AC | PRN
  Administered 2023-06-27: 1 via RECTAL
  Filled 2023-06-26: qty 1

## 2023-06-26 MED ORDER — ALBUMIN HUMAN 5 % IV SOLN
INTRAVENOUS | Status: AC
  Filled 2023-06-26: qty 250

## 2023-06-26 MED ORDER — LIDOCAINE HCL (PF) 2 % IJ SOLN
INTRAMUSCULAR | Status: AC
Start: 2023-06-26 — End: ?
  Filled 2023-06-26: qty 5

## 2023-06-26 MED ORDER — PROPOFOL 500 MG/50ML IV EMUL
INTRAVENOUS | Status: DC | PRN
  Administered 2023-06-26: 50 ug/kg/min via INTRAVENOUS

## 2023-06-26 MED ORDER — KETAMINE HCL 50 MG/5ML IJ SOSY
PREFILLED_SYRINGE | INTRAMUSCULAR | Status: AC
Start: 2023-06-26 — End: ?
  Filled 2023-06-26: qty 5

## 2023-06-26 MED ORDER — SODIUM CHLORIDE 0.9 % IV SOLN: INTRAVENOUS | Status: DC

## 2023-06-26 MED ORDER — TRAMADOL HCL 50 MG PO TABS
50.0000 mg | ORAL_TABLET | Freq: Four times a day (QID) | ORAL | Status: DC | PRN
  Administered 2023-06-26 – 2023-06-28 (×2): 50 mg via ORAL
  Filled 2023-06-26 (×2): qty 1

## 2023-06-26 MED ORDER — OXYCODONE HCL 5 MG PO TABS: 5.0000 mg | ORAL_TABLET | Freq: Once | ORAL | Status: DC | PRN

## 2023-06-26 MED ORDER — DIPHENHYDRAMINE HCL 12.5 MG/5ML PO ELIX: 12.5000 mg | ORAL_SOLUTION | ORAL | Status: DC | PRN

## 2023-06-26 MED ORDER — FENTANYL CITRATE PF 50 MCG/ML IJ SOSY
PREFILLED_SYRINGE | INTRAMUSCULAR | Status: AC
  Filled 2023-06-26: qty 1

## 2023-06-26 MED ORDER — EPHEDRINE 5 MG/ML INJ
INTRAVENOUS | Status: AC
Start: 2023-06-26 — End: ?
  Filled 2023-06-26: qty 5

## 2023-06-26 MED ORDER — BISACODYL 10 MG RE SUPP
10.0000 mg | Freq: Every day | RECTAL | Status: DC | PRN
  Filled 2023-06-26: qty 1

## 2023-06-26 MED ORDER — BUPIVACAINE-EPINEPHRINE 0.25% -1:200000 IJ SOLN
INTRAMUSCULAR | Status: AC
  Filled 2023-06-26: qty 1

## 2023-06-26 MED ORDER — ALBUMIN HUMAN 5 % IV SOLN: INTRAVENOUS | Status: DC | PRN

## 2023-06-26 MED ORDER — MIDAZOLAM HCL 5 MG/5ML IJ SOLN
INTRAMUSCULAR | Status: DC | PRN
  Administered 2023-06-26 (×2): 1 mg via INTRAVENOUS

## 2023-06-26 MED ORDER — MIDAZOLAM HCL 2 MG/2ML IJ SOLN
INTRAMUSCULAR | Status: AC
Start: 2023-06-26 — End: ?
  Filled 2023-06-26: qty 2

## 2023-06-26 MED ORDER — ASPIRIN 81 MG PO CHEW
81.0000 mg | CHEWABLE_TABLET | Freq: Two times a day (BID) | ORAL | Status: DC
  Administered 2023-06-27 – 2023-06-28 (×3): 81 mg via ORAL
  Filled 2023-06-26 (×3): qty 1

## 2023-06-26 MED ORDER — FENTANYL CITRATE (PF) 100 MCG/2ML IJ SOLN
INTRAMUSCULAR | Status: AC
  Filled 2023-06-26: qty 2

## 2023-06-26 MED ORDER — AMISULPRIDE (ANTIEMETIC) 5 MG/2ML IV SOLN: 10.0000 mg | Freq: Once | INTRAVENOUS | Status: DC | PRN

## 2023-06-26 MED ORDER — PANTOPRAZOLE SODIUM 40 MG PO TBEC
40.0000 mg | DELAYED_RELEASE_TABLET | Freq: Every day | ORAL | Status: DC
Start: 2023-06-26 — End: 2023-06-28
  Administered 2023-06-26 – 2023-06-28 (×3): 40 mg via ORAL
  Filled 2023-06-26 (×3): qty 1

## 2023-06-26 MED ORDER — PROPOFOL 1000 MG/100ML IV EMUL
INTRAVENOUS | Status: AC
  Filled 2023-06-26: qty 100

## 2023-06-26 MED ORDER — CEFAZOLIN SODIUM-DEXTROSE 2-4 GM/100ML-% IV SOLN
2.0000 g | Freq: Four times a day (QID) | INTRAVENOUS | Status: AC
  Administered 2023-06-26 (×2): 2 g via INTRAVENOUS
  Filled 2023-06-26 (×2): qty 100

## 2023-06-26 MED ORDER — PHENYLEPHRINE HCL-NACL 20-0.9 MG/250ML-% IV SOLN
INTRAVENOUS | Status: DC | PRN
  Administered 2023-06-26: 30 ug/min via INTRAVENOUS

## 2023-06-26 MED ORDER — DEXAMETHASONE SODIUM PHOSPHATE 10 MG/ML IJ SOLN
8.0000 mg | Freq: Once | INTRAMUSCULAR | Status: AC
  Administered 2023-06-26: 4 mg via INTRAVENOUS

## 2023-06-26 MED ORDER — INSULIN ASPART 100 UNIT/ML IJ SOLN
0.0000 [IU] | Freq: Three times a day (TID) | INTRAMUSCULAR | Status: DC
  Administered 2023-06-27: 8 [IU] via SUBCUTANEOUS
  Administered 2023-06-27: 3 [IU] via SUBCUTANEOUS
  Administered 2023-06-27 – 2023-06-28 (×2): 2 [IU] via SUBCUTANEOUS
  Administered 2023-06-28: 3 [IU] via SUBCUTANEOUS

## 2023-06-26 MED ORDER — MENTHOL 3 MG MT LOZG
1.0000 | LOZENGE | OROMUCOSAL | Status: DC | PRN
Start: 2023-06-26 — End: 2023-06-28

## 2023-06-26 MED ORDER — 0.9 % SODIUM CHLORIDE (POUR BTL) OPTIME
TOPICAL | Status: DC | PRN
  Administered 2023-06-26: 1000 mL

## 2023-06-26 MED ORDER — DEXAMETHASONE SODIUM PHOSPHATE 10 MG/ML IJ SOLN
10.0000 mg | Freq: Once | INTRAMUSCULAR | Status: AC
  Administered 2023-06-27: 10 mg via INTRAVENOUS
  Filled 2023-06-26: qty 1

## 2023-06-26 MED ORDER — INSULIN ASPART 100 UNIT/ML IJ SOLN: 0.0000 [IU] | INTRAMUSCULAR | Status: DC | PRN

## 2023-06-26 MED ORDER — ORAL CARE MOUTH RINSE: 15.0000 mL | Freq: Once | OROMUCOSAL | Status: AC

## 2023-06-26 MED ORDER — INSULIN ASPART 100 UNIT/ML IJ SOLN
0.0000 [IU] | Freq: Every day | INTRAMUSCULAR | Status: DC
  Administered 2023-06-26: 3 [IU] via SUBCUTANEOUS
  Administered 2023-06-27: 2 [IU] via SUBCUTANEOUS

## 2023-06-26 MED ORDER — BUPIVACAINE IN DEXTROSE 0.75-8.25 % IT SOLN
INTRATHECAL | Status: DC | PRN
  Administered 2023-06-26: 1.6 mL via INTRATHECAL

## 2023-06-26 MED ORDER — TRANEXAMIC ACID-NACL 1000-0.7 MG/100ML-% IV SOLN
1000.0000 mg | INTRAVENOUS | Status: AC
  Administered 2023-06-26: 1000 mg via INTRAVENOUS
  Filled 2023-06-26: qty 100

## 2023-06-26 MED ORDER — DOCUSATE SODIUM 100 MG PO CAPS
100.0000 mg | ORAL_CAPSULE | Freq: Two times a day (BID) | ORAL | Status: DC
  Administered 2023-06-26 – 2023-06-27 (×2): 100 mg via ORAL
  Filled 2023-06-26 (×3): qty 1

## 2023-06-26 MED ORDER — BUPIVACAINE HCL 0.25 % IJ SOLN
INTRAMUSCULAR | Status: AC
Start: 2023-06-26 — End: ?
  Filled 2023-06-26: qty 1

## 2023-06-26 MED ORDER — KETAMINE HCL 10 MG/ML IJ SOLN
INTRAMUSCULAR | Status: DC | PRN
  Administered 2023-06-26 (×2): 10 mg via INTRAVENOUS

## 2023-06-26 MED ORDER — LACTATED RINGERS IV SOLN: INTRAVENOUS | Status: DC

## 2023-06-26 MED ORDER — ACETAMINOPHEN 10 MG/ML IV SOLN
1000.0000 mg | Freq: Four times a day (QID) | INTRAVENOUS | Status: DC
  Administered 2023-06-26: 1000 mg via INTRAVENOUS
  Filled 2023-06-26: qty 100

## 2023-06-26 MED ORDER — PHENOL 1.4 % MT LIQD: 1.0000 | OROMUCOSAL | Status: DC | PRN

## 2023-06-26 MED ORDER — PROMETHAZINE HCL 25 MG PO TABS: 12.5000 mg | ORAL_TABLET | Freq: Four times a day (QID) | ORAL | Status: DC | PRN

## 2023-06-26 MED ORDER — DEXAMETHASONE SODIUM PHOSPHATE 10 MG/ML IJ SOLN
INTRAMUSCULAR | Status: AC
  Filled 2023-06-26: qty 1

## 2023-06-26 MED ORDER — FENTANYL CITRATE (PF) 100 MCG/2ML IJ SOLN
INTRAMUSCULAR | Status: DC | PRN
  Administered 2023-06-26 (×2): 50 ug via INTRAVENOUS

## 2023-06-26 MED ORDER — ACETAMINOPHEN 500 MG PO TABS
1000.0000 mg | ORAL_TABLET | Freq: Four times a day (QID) | ORAL | Status: AC
  Administered 2023-06-26 – 2023-06-27 (×4): 1000 mg via ORAL
  Filled 2023-06-26 (×3): qty 2

## 2023-06-26 MED ORDER — MOMETASONE FURO-FORMOTEROL FUM 200-5 MCG/ACT IN AERO
2.0000 | INHALATION_SPRAY | Freq: Two times a day (BID) | RESPIRATORY_TRACT | Status: DC
  Administered 2023-06-26 – 2023-06-28 (×4): 2 via RESPIRATORY_TRACT
  Filled 2023-06-26: qty 8.8

## 2023-06-26 MED ORDER — PHENYLEPHRINE HCL-NACL 20-0.9 MG/250ML-% IV SOLN
INTRAVENOUS | Status: AC
  Filled 2023-06-26: qty 250

## 2023-06-26 MED ORDER — SODIUM CHLORIDE 0.9 % IV SOLN
12.5000 mg | INTRAVENOUS | Status: DC | PRN
  Administered 2023-06-26: 12.5 mg via INTRAVENOUS
  Filled 2023-06-26: qty 12.5

## 2023-06-26 MED ORDER — HYDROMORPHONE HCL 1 MG/ML IJ SOLN
0.5000 mg | INTRAMUSCULAR | Status: DC | PRN
  Administered 2023-06-26 – 2023-06-28 (×4): 1 mg via INTRAVENOUS
  Filled 2023-06-26 (×4): qty 1

## 2023-06-26 MED ORDER — ACETAMINOPHEN 325 MG PO TABS
325.0000 mg | ORAL_TABLET | Freq: Four times a day (QID) | ORAL | Status: DC | PRN
  Filled 2023-06-26: qty 2

## 2023-06-26 MED ORDER — CHLORHEXIDINE GLUCONATE 0.12 % MT SOLN
15.0000 mL | Freq: Once | OROMUCOSAL | Status: AC
  Administered 2023-06-26: 15 mL via OROMUCOSAL

## 2023-06-26 MED ORDER — OXYCODONE HCL 5 MG/5ML PO SOLN: 5.0000 mg | Freq: Once | ORAL | Status: DC | PRN

## 2023-06-26 MED ORDER — ALBUTEROL SULFATE (2.5 MG/3ML) 0.083% IN NEBU
2.5000 mg | INHALATION_SOLUTION | Freq: Four times a day (QID) | RESPIRATORY_TRACT | Status: DC | PRN
  Administered 2023-06-27 – 2023-06-28 (×2): 2.5 mg via RESPIRATORY_TRACT
  Filled 2023-06-26 (×2): qty 3

## 2023-06-26 MED ORDER — PROMETHAZINE HCL 25 MG/ML IJ SOLN
INTRAMUSCULAR | Status: AC
  Filled 2023-06-26: qty 1

## 2023-06-26 MED ORDER — ALBUTEROL SULFATE HFA 108 (90 BASE) MCG/ACT IN AERS: 2.0000 | INHALATION_SPRAY | Freq: Four times a day (QID) | RESPIRATORY_TRACT | Status: DC | PRN

## 2023-06-26 MED ORDER — POVIDONE-IODINE 10 % EX SWAB: 2.0000 | Freq: Once | CUTANEOUS | Status: DC

## 2023-06-26 MED ORDER — POLYETHYLENE GLYCOL 3350 17 G PO PACK
17.0000 g | PACK | Freq: Every day | ORAL | Status: DC | PRN
  Administered 2023-06-27 – 2023-06-28 (×2): 17 g via ORAL
  Filled 2023-06-26 (×3): qty 1

## 2023-06-26 MED ORDER — FENTANYL CITRATE PF 50 MCG/ML IJ SOSY
25.0000 ug | PREFILLED_SYRINGE | INTRAMUSCULAR | Status: DC | PRN
  Administered 2023-06-26: 50 ug via INTRAVENOUS

## 2023-06-26 MED ORDER — OXYCODONE HCL 5 MG PO TABS
10.0000 mg | ORAL_TABLET | Freq: Four times a day (QID) | ORAL | Status: DC | PRN
  Administered 2023-06-26 – 2023-06-28 (×4): 10 mg via ORAL
  Filled 2023-06-26 (×5): qty 2

## 2023-06-26 MED ORDER — FLUTICASONE PROPIONATE 50 MCG/ACT NA SUSP
1.0000 | Freq: Every day | NASAL | Status: DC
Start: 2023-06-27 — End: 2023-06-28
  Administered 2023-06-27 – 2023-06-28 (×2): 1 via NASAL
  Filled 2023-06-26: qty 16

## 2023-06-26 MED ORDER — PROPOFOL 500 MG/50ML IV EMUL: INTRAVENOUS | Status: DC | PRN

## 2023-06-26 MED ORDER — HYDROMORPHONE HCL 2 MG PO TABS
2.0000 mg | ORAL_TABLET | ORAL | Status: DC | PRN
Start: 2023-06-26 — End: 2023-06-28
  Administered 2023-06-26 – 2023-06-28 (×8): 4 mg via ORAL
  Filled 2023-06-26 (×8): qty 2

## 2023-06-26 SURGICAL SUPPLY — 36 items
BAG COUNTER SPONGE SURGICOUNT (BAG) IMPLANT
BAG ZIPLOCK 12X15 (MISCELLANEOUS) IMPLANT
BLADE SAG 18X100X1.27 (BLADE) ×1 IMPLANT
COVER PERINEAL POST (MISCELLANEOUS) ×1 IMPLANT
COVER SURGICAL LIGHT HANDLE (MISCELLANEOUS) ×1 IMPLANT
CUP ACETBLR 48 OD SECTOR II (Hips) IMPLANT
DERMABOND ADVANCED .7 DNX12 (GAUZE/BANDAGES/DRESSINGS) ×1 IMPLANT
DRAPE FOOT SWITCH (DRAPES) ×1 IMPLANT
DRAPE STERI IOBAN 125X83 (DRAPES) ×1 IMPLANT
DRAPE U-SHAPE 47X51 STRL (DRAPES) ×2 IMPLANT
DRSG AQUACEL AG ADV 3.5X10 (GAUZE/BANDAGES/DRESSINGS) ×1 IMPLANT
DURAPREP 26ML APPLICATOR (WOUND CARE) ×1 IMPLANT
ELECT REM PT RETURN 15FT ADLT (MISCELLANEOUS) ×1 IMPLANT
GLOVE BIO SURGEON STRL SZ 6.5 (GLOVE) ×1 IMPLANT
GLOVE BIO SURGEON STRL SZ8 (GLOVE) ×1 IMPLANT
GLOVE BIOGEL PI IND STRL 6.5 (GLOVE) IMPLANT
GLOVE BIOGEL PI IND STRL 7.0 (GLOVE) IMPLANT
GLOVE BIOGEL PI IND STRL 8 (GLOVE) ×1 IMPLANT
GOWN STRL REUS W/ TWL LRG LVL3 (GOWN DISPOSABLE) ×2 IMPLANT
HEAD CERAMIC DELTA 28 P1.5 HIP (Head) IMPLANT
HOLDER FOLEY CATH W/STRAP (MISCELLANEOUS) ×1 IMPLANT
KIT TURNOVER KIT A (KITS) IMPLANT
LINER MARATHON 28 48 (Hips) IMPLANT
MANIFOLD NEPTUNE II (INSTRUMENTS) ×1 IMPLANT
PACK ANTERIOR HIP CUSTOM (KITS) ×1 IMPLANT
PENCIL SMOKE EVACUATOR COATED (MISCELLANEOUS) ×1 IMPLANT
SPIKE FLUID TRANSFER (MISCELLANEOUS) ×1 IMPLANT
STEM FEM SZ3 STD ACTIS (Stem) IMPLANT
SUT ETHIBOND NAB CT1 #1 30IN (SUTURE) ×1 IMPLANT
SUT MNCRL AB 4-0 PS2 18 (SUTURE) ×1 IMPLANT
SUT STRATAFIX 0 PDS 27 VIOLET (SUTURE) ×1
SUT VIC AB 2-0 CT1 TAPERPNT 27 (SUTURE) ×2 IMPLANT
SUTURE STRATFX 0 PDS 27 VIOLET (SUTURE) ×1 IMPLANT
TOWEL GREEN STERILE FF (TOWEL DISPOSABLE) ×1 IMPLANT
TRAY FOLEY MTR SLVR 16FR STAT (SET/KITS/TRAYS/PACK) ×1 IMPLANT
TUBE SUCTION HIGH CAP CLEAR NV (SUCTIONS) ×1 IMPLANT

## 2023-06-26 NOTE — Op Note (Signed)
 OPERATIVE REPORT- TOTAL HIP ARTHROPLASTY   PREOPERATIVE DIAGNOSIS: Osteoarthritis of the Right hip.   POSTOPERATIVE DIAGNOSIS: Osteoarthritis of the Right  hip.   PROCEDURE: Right total hip arthroplasty, anterior approach.   SURGEON: Liliane Rei, MD   ASSISTANT: Sharlynn Dear, PA-C  ANESTHESIA:  Spinal  ESTIMATED BLOOD LOSS:-300 mL    DRAINS: None  COMPLICATIONS: None   CONDITION: PACU - hemodynamically stable.   BRIEF CLINICAL NOTE: Alyssa Montgomery is a 63 y.o. female who has advanced end-  stage arthritis of their Right  hip with progressively worsening pain and  dysfunction.The patient has failed nonoperative management and presents for  total hip arthroplasty.   PROCEDURE IN DETAIL: After successful administration of spinal  anesthetic, the traction boots for the [facility] bed were placed on both  feet and the patient was placed onto the [facility] bed, boots placed into the leg  holders. The Right hip was then isolated from the perineum with plastic  drapes and prepped and draped in the usual sterile fashion. ASIS and  greater trochanter were marked and a oblique incision was made, starting  at about 1 cm lateral and 2 cm distal to the ASIS and coursing towards  the anterior cortex of the femur. The skin was cut with a 10 blade  through subcutaneous tissue to the level of the fascia overlying the  tensor fascia lata muscle. The fascia was then incised in line with the  incision at the junction of the anterior third and posterior 2/3rd. The  muscle was teased off the fascia and then the interval between the TFL  and the rectus was developed. The Hohmann retractor was then placed at  the top of the femoral neck over the capsule. The vessels overlying the  capsule were cauterized and the fat on top of the capsule was removed.  A Hohmann retractor was then placed anterior underneath the rectus  femoris to give exposure to the entire anterior capsule. A T-shaped   capsulotomy was performed. The edges were tagged and the femoral head  was identified.       Osteophytes are removed off the superior acetabulum.  The femoral neck was then cut in situ with an oscillating saw. Traction  was then applied to the left lower extremity utilizing the [facility]  traction. The femoral head was then removed. Retractors were placed  around the acetabulum and then circumferential removal of the labrum was  performed. Osteophytes were also removed. Reaming starts at 45 mm to  medialize and  Increased in 2 mm increments to 47 mm. We reamed in  approximately 40 degrees of abduction, 20 degrees anteversion. A 48 mm  pinnacle acetabular shell was then impacted in anatomic position under  fluoroscopic guidance with excellent purchase. We did not need to place  any additional dome screws. A 28 mm neural + 4 marathon liner was then  placed into the acetabular shell.       The femoral lift was then placed along the lateral aspect of the femur  just distal to the vastus ridge. The leg was  externally rotated and capsule  was stripped off the inferior aspect of the femoral neck down to the  level of the lesser trochanter, this was done with electrocautery. The femur was lifted after this was performed. The  leg was then placed in an extended and adducted position essentially delivering the femur. We also removed the capsule superiorly and the piriformis from the piriformis fossa to  gain excellent exposure of the  proximal femur. Rongeur was used to remove some cancellous bone to get  into the lateral portion of the proximal femur for placement of the  initial starter reamer. The starter broaches was placed  the starter broach  and was shown to go down the center of the canal. Broaching  with the Actis system was then performed starting at size 0  coursing  Up to size 3. A size 3 had excellent torsional and rotational  and axial stability. The trial standard offset neck was then  placed  with a 28 + 1.5 trial head. The hip was then reduced. We confirmed that  the stem was in the canal both on AP and lateral x-rays. It also has excellent sizing. The hip was reduced with outstanding stability through full extension and full external rotation.. AP pelvis was taken and the leg lengths were measured and found to be equal. Hip was then dislocated again and the femoral head and neck removed. The  femoral broach was removed. Size 3 Actis stem with a standard offset  neck was then impacted into the femur following native anteversion. Has  excellent purchase in the canal. Excellent torsional and rotational and  axial stability. It is confirmed to be in the canal on AP and lateral  fluoroscopic views. The 28 + 1.5 ceramic head was placed and the hip  reduced with outstanding stability. Again AP pelvis was taken and it  confirmed that the leg lengths were equal. The wound was then copiously  irrigated with saline solution and the capsule reattached and repaired  with Ethibond suture. 30 ml of .25% Bupivicaine was  injected into the capsule and into the edge of the tensor fascia lata as well as subcutaneous tissue. The fascia overlying the tensor fascia lata was then closed with a running #1 V-Loc. Subcu was closed with interrupted 2-0 Vicryl and subcuticular running 4-0 Monocryl. Incision was cleaned  and dried. Steri-Strips and a bulky sterile dressing applied. The patient was awakened and transported to  recovery in stable condition.        Please note that a surgical assistant was a medical necessity for this procedure to perform it in a safe and expeditious manner. Assistant was necessary to provide appropriate retraction of vital neurovascular structures and to prevent femoral fracture and allow for anatomic placement of the prosthesis.  Liliane Rei, M.D.

## 2023-06-26 NOTE — Progress Notes (Signed)
 Patient is adamantly refusing the bed alarm and wanted the bedside commode placed next to her bed for urinary frequency. Patient observed doing a stand and pivot with the walker to get to the bedside commode. Again requested that patient call us  for assistance but patient stated "I can do this myself", reiterated to please call for help when needed, scd's left off as they now pose a tripping hazard, will continue to round frequently and educate patient on her being a fall risk.

## 2023-06-26 NOTE — Plan of Care (Signed)
  Problem: Education: Goal: Knowledge of the prescribed therapeutic regimen will improve Outcome: Progressing   Problem: Pain Management: Goal: Pain level will decrease with appropriate interventions Outcome: Progressing   Problem: Education: Goal: Knowledge of General Education information will improve Description: Including pain rating scale, medication(s)/side effects and non-pharmacologic comfort measures Outcome: Progressing   

## 2023-06-26 NOTE — Interval H&P Note (Signed)
 History and Physical Interval Note:  06/26/2023 10:05 AM  Alyssa Montgomery  has presented today for surgery, with the diagnosis of right hip osteoarthritis.  The various methods of treatment have been discussed with the patient and family. After consideration of risks, benefits and other options for treatment, the patient has consented to  Procedure(s): TOTAL HIP ARTHROPLASTY ANTERIOR APPROACH (Right) as a surgical intervention.  The patient's history has been reviewed, patient examined, no change in status, stable for surgery.  I have reviewed the patient's chart and labs.  Questions were answered to the patient's satisfaction.     Samuel Crock Alenah Sarria

## 2023-06-26 NOTE — Discharge Instructions (Signed)
Ollen Gross, MD Total Joint Specialist [facility] [address], [address] [place], [place] [address] [phone]  ANTERIOR APPROACH TOTAL HIP REPLACEMENT POSTOPERATIVE DIRECTIONS     Hip Rehabilitation, Guidelines Following Surgery  The results of a hip operation are greatly improved after range of motion and muscle strengthening exercises. Follow all safety measures which are given to protect your hip. If any of these exercises cause increased pain or swelling in your joint, decrease the amount until you are comfortable again. Then slowly increase the exercises. Call your caregiver if you have problems or questions.   BLOOD CLOT PREVENTION Take an 81 mg Aspirin two times a day for three weeks following surgery. Then take an 81 mg Aspirin once a day for three weeks. Then discontinue Aspirin. You may resume your vitamins/supplements upon discharge from the hospital. Do not take any NSAIDs (Advil, Aleve, Ibuprofen, Meloxicam, etc.) until you have discontinued the 81 mg Aspirin twice a day.  HOME CARE INSTRUCTIONS  Remove items at home which could result in a fall. This includes throw rugs or furniture in walking pathways.  ICE to the affected hip as frequently as 20-30 minutes an hour and then as needed for pain and swelling. Continue to use ice on the hip for pain and swelling from surgery. You may notice swelling that will progress down to the foot and ankle. This is normal after surgery. Elevate the leg when you are not up walking on it.   Continue to use the breathing machine which will help keep your temperature down.  It is common for your temperature to cycle up and down following surgery, especially at night when you are not up moving around and exerting yourself.  The breathing machine keeps your lungs expanded and your temperature down.  DIET You may resume your previous home diet once your are discharged from the hospital.  DRESSING / WOUND CARE /  SHOWERING You have an adhesive waterproof bandage over the incision. Leave this in place until your first follow-up appointment. Once you remove this you will not need to place another bandage.  You may begin showering 3 days following surgery, but do not submerge the incision under water.  ACTIVITY For the first 3-5 days, it is important to rest and keep the operative leg elevated. You should, as a general rule, rest for 50 minutes and walk/stretch for 10 minutes per hour. After 5 days, you may slowly increase activity as tolerated.  Perform the exercises you were provided twice a day for about 15-20 minutes each session. Begin these 2 days following surgery. Walk with your walker as instructed. Use the walker until you are comfortable transitioning to a cane. Walk with the cane in the opposite hand of the operative leg. You may discontinue the cane once you are comfortable and walking steadily. Avoid periods of inactivity such as sitting longer than an hour when not asleep. This helps prevent blood clots.  Do not drive a car for 6 weeks or until released by your surgeon.  Do not drive while taking narcotics.  TED HOSE STOCKINGS Wear the elastic stockings on both legs for three weeks following surgery during the day. You may remove them at night while sleeping.  WEIGHT BEARING Weight bearing as tolerated with assist device (walker, cane, etc) as directed, use it as long as suggested by your surgeon or therapist, typically at least 4-6 weeks.  POSTOPERATIVE CONSTIPATION PROTOCOL Constipation - defined medically as fewer than three stools per week and  severe constipation as less than one stool per week.  One of the most common issues patients have following surgery is constipation.  Even if you have a regular bowel pattern at home, your normal regimen is likely to be disrupted due to multiple reasons following surgery.  Combination of anesthesia, postoperative narcotics, change in appetite and  fluid intake all can affect your bowels.  In order to avoid complications following surgery, here are some recommendations in order to help you during your recovery period.  Colace (docusate) - Pick up an over-the-counter form of Colace or another stool softener and take twice a day as long as you are requiring postoperative pain medications.  Take with a full glass of water daily.  If you experience loose stools or diarrhea, hold the colace until you stool forms back up.  If your symptoms do not get better within 1 week or if they get worse, check with your doctor. Dulcolax (bisacodyl) - Pick up over-the-counter and take as directed by the product packaging as needed to assist with the movement of your bowels.  Take with a full glass of water.  Use this product as needed if not relieved by Colace only.  MiraLax (polyethylene glycol) - Pick up over-the-counter to have on hand.  MiraLax is a solution that will increase the amount of water in your bowels to assist with bowel movements.  Take as directed and can mix with a glass of water, juice, soda, coffee, or tea.  Take if you go more than two days without a movement.Do not use MiraLax more than once per day. Call your doctor if you are still constipated or irregular after using this medication for 7 days in a row.  If you continue to have problems with postoperative constipation, please contact the office for further assistance and recommendations.  If you experience "the worst abdominal pain ever" or develop nausea or vomiting, please contact the office immediatly for further recommendations for treatment.  ITCHING  If you experience itching with your medications, try taking only a single pain pill, or even half a pain pill at a time.  You can also use Benadryl over the counter for itching or also to help with sleep.   MEDICATIONS See your medication summary on the "After Visit Summary" that the nursing staff will review with you prior to discharge.   You may have some home medications which will be placed on hold until you complete the course of blood thinner medication.  It is important for you to complete the blood thinner medication as prescribed by your surgeon.  Continue your approved medications as instructed at time of discharge.  PRECAUTIONS If you experience chest pain or shortness of breath - call 911 immediately for transfer to the hospital emergency department.  If you develop a fever greater that 101 F, purulent drainage from wound, increased redness or drainage from wound, foul odor from the wound/dressing, or calf pain - CONTACT YOUR SURGEON.                                                   FOLLOW-UP APPOINTMENTS Make sure you keep all of your appointments after your operation with your surgeon and caregivers. You should call the office at the above phone number and make an appointment for approximately two weeks after the date of your surgery  or on the date instructed by your surgeon outlined in the "After Visit Summary".  RANGE OF MOTION AND STRENGTHENING EXERCISES  These exercises are designed to help you keep full movement of your hip joint. Follow your caregiver's or physical therapist's instructions. Perform all exercises about fifteen times, three times per day or as directed. Exercise both hips, even if you have had only one joint replacement. These exercises can be done on a training (exercise) mat, on the floor, on a table or on a bed. Use whatever works the best and is most comfortable for you. Use music or television while you are exercising so that the exercises are a pleasant break in your day. This will make your life better with the exercises acting as a break in routine you can look forward to.  Lying on your back, slowly slide your foot toward your buttocks, raising your knee up off the floor. Then slowly slide your foot back down until your leg is straight again.  Lying on your back spread your legs as far apart as  you can without causing discomfort.  Lying on your side, raise your upper leg and foot straight up from the floor as far as is comfortable. Slowly lower the leg and repeat.  Lying on your back, tighten up the muscle in the front of your thigh (quadriceps muscles). You can do this by keeping your leg straight and trying to raise your heel off the floor. This helps strengthen the largest muscle supporting your knee.  Lying on your back, tighten up the muscles of your buttocks both with the legs straight and with the knee bent at a comfortable angle while keeping your heel on the floor.   POST-OPERATIVE OPIOID TAPER INSTRUCTIONS: It is important to wean off of your opioid medication as soon as possible. If you do not need pain medication after your surgery it is ok to stop day one. Opioids include: Codeine, Hydrocodone(Norco, Vicodin), Oxycodone(Percocet, oxycontin) and hydromorphone amongst others.  Long term and even short term use of opiods can cause: Increased pain response Dependence Constipation Depression Respiratory depression And more.  Withdrawal symptoms can include Flu like symptoms Nausea, vomiting And more Techniques to manage these symptoms Hydrate well Eat regular healthy meals Stay active Use relaxation techniques(deep breathing, meditating, yoga) Do Not substitute Alcohol to help with tapering If you have been on opioids for less than two weeks and do not have pain than it is ok to stop all together.  Plan to wean off of opioids This plan should start within one week post op of your joint replacement. Maintain the same interval or time between taking each dose and first decrease the dose.  Cut the total daily intake of opioids by one tablet each day Next start to increase the time between doses. The last dose that should be eliminated is the evening dose.   IF YOU ARE TRANSFERRED TO A SKILLED REHAB FACILITY If the patient is transferred to a skilled rehab facility  following release from the hospital, a list of the current medications will be sent to the facility for the patient to continue.  When discharged from the skilled rehab facility, please have the facility set up the patient's Home Health Physical Therapy prior to being released. Also, the skilled facility will be responsible for providing the patient with their medications at time of release from the facility to include their pain medication, the muscle relaxants, and their blood thinner medication. If the patient is still at  the rehab facility at time of the two week follow up appointment, the skilled rehab facility will also need to assist the patient in arranging follow up appointment in our office and any transportation needs.  MAKE SURE YOU:  Understand these instructions.  Get help right away if you are not doing well or get worse.    DENTAL ANTIBIOTICS:  In most cases prophylactic antibiotics for Dental procdeures after total joint surgery are not necessary.  Exceptions are as follows:  1. History of prior total joint infection  2. Severely immunocompromised (Organ Transplant, cancer chemotherapy, Rheumatoid biologic meds such as Humera)  3. Poorly controlled diabetes (A1C &gt; 8.0, blood glucose over 200)  If you have one of these conditions, contact your surgeon for an antibiotic prescription, prior to your dental procedure.    Pick up stool softner and laxative for home use following surgery while on pain medications. Do not submerge incision under water. Please use good hand washing techniques while changing dressing each day. May shower starting three days after surgery. Please use a clean towel to pat the incision dry following showers. Continue to use ice for pain and swelling after surgery. Do not use any lotions or creams on the incision until instructed by your surgeon.

## 2023-06-26 NOTE — Anesthesia Procedure Notes (Signed)
 Spinal  Patient location during procedure: OR Start time: 06/26/2023 11:28 AM End time: 06/26/2023 11:32 AM Reason for block: surgical anesthesia Staffing Performed: resident/CRNA  Resident/CRNA: Patt Boozer, CRNA Performed by: Patt Boozer, CRNA Authorized by: Juventino Oppenheim, MD   Preanesthetic Checklist Completed: patient identified, IV checked, site marked, risks and benefits discussed, surgical consent, monitors and equipment checked, pre-op evaluation and timeout performed Spinal Block Patient position: sitting Prep: DuraPrep Patient monitoring: heart rate, continuous pulse ox and blood pressure Approach: midline Location: L4-5 Injection technique: single-shot Needle Needle type: Pencan  Needle gauge: 24 G Needle length: 9 cm Needle insertion depth: 5 cm Assessment Sensory level: T6 Events: CSF return Additional Notes

## 2023-06-26 NOTE — Transfer of Care (Signed)
 Immediate Anesthesia Transfer of Care Note  Patient: Alyssa Montgomery  Procedure(s) Performed: TOTAL HIP ARTHROPLASTY ANTERIOR APPROACH (Right: Hip)  Patient Location: PACU  Anesthesia Type:Spinal  Level of Consciousness: awake, alert , oriented, and patient cooperative  Airway & Oxygen Therapy: Patient Spontanous Breathing and Patient connected to face mask oxygen  Post-op Assessment: Report given to RN and Post -op Vital signs reviewed and stable  Post vital signs: Reviewed and stable  Last Vitals:  Vitals Value Taken Time  BP 118/73 06/26/23 1322  Temp    Pulse 60 06/26/23 1325  Resp 11 06/26/23 1325  SpO2 100 % 06/26/23 1325  Vitals shown include unfiled device data.  Last Pain:  Vitals:   06/26/23 0918  TempSrc: Oral  PainSc:          Complications: No notable events documented.

## 2023-06-26 NOTE — Evaluation (Signed)
 Physical Therapy Evaluation Patient Details Name: Alyssa Montgomery MRN: [id number] DOB: 08-21-1960 Today's Date: 06/26/2023  History of Present Illness  63 y.o. female admitted 06/26/23 for R AA-THA. PMH: L THA 2022, back surgery, fibromyalgia, obesity, DM, neuropathy, OA, L reverse TSA 2023.  Clinical Impression  Pt is s/p THA resulting in the deficits listed below (see PT Problem List). Pt ambulated 45' with RW, distance limited by "100/10" pain R hip. Initiated THA HEP. Good progress expected.  Pt will benefit from acute skilled PT to increase their independence and safety with mobility to facilitate discharge.          If plan is discharge home, recommend the following: A little help with walking and/or transfers;A little help with bathing/dressing/bathroom;Assistance with cooking/housework;Help with stairs or ramp for entrance;Assist for transportation   Can travel by private vehicle        Equipment Recommendations None recommended by PT  Recommendations for Other Services       Functional Status Assessment Patient has had a recent decline in their functional status and demonstrates the ability to make significant improvements in function in a reasonable and predictable amount of time.     Precautions / Restrictions Precautions Precautions: Fall Restrictions Weight Bearing Restrictions Per Provider Order: No Other Position/Activity Restrictions: WBAT      Mobility  Bed Mobility Overal bed mobility: Needs Assistance Bed Mobility: Supine to Sit     Supine to sit: Min assist     General bed mobility comments: assist to raise trunk and advance RLE    Transfers Overall transfer level: Needs assistance Equipment used: Rolling walker (2 wheels) Transfers: Sit to/from Stand, Bed to chair/wheelchair/BSC Sit to Stand: Contact guard assist           General transfer comment: VCs hand placement    Ambulation/Gait Ambulation/Gait assistance: Contact guard  assist Gait Distance (Feet): 18 Feet Assistive device: Rolling walker (2 wheels) Gait Pattern/deviations: Step-to pattern, Decreased step length - left, Decreased step length - right, Antalgic, Decreased stance time - right Gait velocity: decr     General Gait Details: VCs sequencing, distance limited by 10/10 pain  Stairs            Wheelchair Mobility     Tilt Bed    Modified Rankin (Stroke Patients Only)       Balance Overall balance assessment: Modified Independent                                           Pertinent Vitals/Pain Pain Assessment Pain Assessment: 0-10 Pain Score: 10-Worst pain ever Pain Location: R hip Pain Descriptors / Indicators: Sore, Operative site guarding Pain Intervention(s): Limited activity within patient's tolerance, Monitored during session, Premedicated before session, Ice applied, Repositioned    Home Living Family/patient expects to be discharged to:: Private residence Living Arrangements: Spouse/significant other Available Help at Discharge: Family;Available PRN/intermittently Type of Home: House Home Access: Stairs to enter Entrance Stairs-Rails: Can reach both;Left;Right Entrance Stairs-Number of Steps: 8   Home Layout: One level Home Equipment: Cane - single Librarian, academic (2 wheels)      Prior Function Prior Level of Function : Independent/Modified Independent             Mobility Comments: walked without AD, works as a Research officer, political party ADLs Comments: independent     Extremity/Trunk Assessment   Upper Extremity Assessment Upper Extremity  Assessment: Overall WFL for tasks assessed    Lower Extremity Assessment Lower Extremity Assessment: RLE deficits/detail RLE Deficits / Details: hip 2/5, knee ext 3/5 RLE Sensation: WNL    Cervical / Trunk Assessment Cervical / Trunk Assessment: Normal  Communication   Communication Communication: No apparent difficulties  Cognition Arousal:  Alert Behavior During Therapy: WFL for tasks assessed/performed Overall Cognitive Status: Within Functional Limits for tasks assessed                                          General Comments      Exercises Total Joint Exercises Ankle Circles/Pumps: AROM, Both, 10 reps, Supine Heel Slides: AAROM, Right, 5 reps, Supine   Assessment/Plan    PT Assessment Patient needs continued PT services  PT Problem List Decreased activity tolerance;Decreased mobility;Pain       PT Treatment Interventions Functional mobility training;Therapeutic activities;Gait training;DME instruction;Therapeutic exercise    PT Goals (Current goals can be found in the Care Plan section)  Acute Rehab 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 PT Goal Formulation: With patient Time For Goal Achievement: 07/03/23 Potential to Achieve Goals: Good    Frequency 7X/week     Co-evaluation               AM-PAC PT "6 Clicks" Mobility  Outcome Measure Help needed turning from your back to your side while in a flat bed without using bedrails?: A Little Help needed moving from lying on your back to sitting on the side of a flat bed without using bedrails?: A Little Help needed moving to and from a bed to a chair (including a wheelchair)?: A Little Help needed standing up from a chair using your arms (e.g., wheelchair or bedside chair)?: A Little Help needed to walk in hospital room?: A Little Help needed climbing 3-5 steps with a railing? : A Lot 6 Click Score: 17    End of Session Equipment Utilized During Treatment: Gait belt Activity Tolerance: Patient tolerated treatment well Patient left: in chair;with chair alarm set;with call bell/phone within reach Nurse Communication: Mobility status PT Visit Diagnosis: Pain;Difficulty in walking, not elsewhere classified (R26.2);History of falling (Z91.81) Pain - Right/Left: Right Pain - part of body: Hip    Time: 1551-1610 PT  Time Calculation (min) (ACUTE ONLY): 19 min   Charges:   PT Evaluation $PT Eval Moderate Complexity: 1 Mod   PT General Charges $$ ACUTE PT VISIT: 1 Visit        Daymon Evans PT 06/26/2023  Acute Rehabilitation Services  Office [phone]

## 2023-06-26 NOTE — Anesthesia Postprocedure Evaluation (Signed)
 Anesthesia Post Note  Patient: Alyssa Montgomery  Procedure(s) Performed: TOTAL HIP ARTHROPLASTY ANTERIOR APPROACH (Right: Hip)     Patient location during evaluation: PACU Anesthesia Type: Spinal Level of consciousness: awake and alert Pain management: pain level controlled Vital Signs Assessment: post-procedure vital signs reviewed and stable Respiratory status: spontaneous breathing and respiratory function stable Cardiovascular status: blood pressure returned to baseline and stable Postop Assessment: spinal receding and no apparent nausea or vomiting Anesthetic complications: no   No notable events documented.  Last Vitals:  Vitals:   06/26/23 1415 06/26/23 1430  BP: (!) 148/92 136/66  Pulse: 62 63  Resp: 14 15  Temp:    SpO2: 98% 95%    Last Pain:  Vitals:   06/26/23 1430  TempSrc:   PainSc: 4                  Juventino Oppenheim

## 2023-06-27 ENCOUNTER — Encounter (HOSPITAL_COMMUNITY): Payer: Self-pay | Admitting: Orthopedic Surgery

## 2023-06-27 DIAGNOSIS — M1611 Unilateral primary osteoarthritis, right hip: Secondary | ICD-10-CM | POA: Diagnosis not present

## 2023-06-27 LAB — CBC
HCT: 37.8 % (ref 36.0–46.0)
Hemoglobin: 11.5 g/dL — ABNORMAL LOW (ref 12.0–15.0)
MCH: 25.6 pg — ABNORMAL LOW (ref 26.0–34.0)
MCHC: 30.4 g/dL (ref 30.0–36.0)
MCV: 84.2 fL (ref 80.0–100.0)
Platelets: 250 10*3/uL (ref 150–400)
RBC: 4.49 MIL/uL (ref 3.87–5.11)
RDW: 14.8 % (ref 11.5–15.5)
WBC: 13.2 10*3/uL — ABNORMAL HIGH (ref 4.0–10.5)
nRBC: 0 % (ref 0.0–0.2)

## 2023-06-27 LAB — BASIC METABOLIC PANEL
Anion gap: 10 (ref 5–15)
BUN: 14 mg/dL (ref 8–23)
CO2: 23 mmol/L (ref 22–32)
Calcium: 9.2 mg/dL (ref 8.9–10.3)
Chloride: 102 mmol/L (ref 98–111)
Creatinine, Ser: 0.76 mg/dL (ref 0.44–1.00)
GFR, Estimated: >60 mL/min (ref >60)
Glucose, Bld: 210 mg/dL — ABNORMAL HIGH (ref 70–99)
Potassium: 4 mmol/L (ref 3.5–5.1)
Sodium: 135 mmol/L (ref 135–145)

## 2023-06-27 LAB — GLUCOSE, CAPILLARY
Glucose-Capillary: 187 mg/dL — ABNORMAL HIGH (ref 70–99)
Glucose-Capillary: 147 mg/dL — ABNORMAL HIGH (ref 70–99)
Glucose-Capillary: 295 mg/dL — ABNORMAL HIGH (ref 70–99)
Glucose-Capillary: 226 mg/dL — ABNORMAL HIGH (ref 70–99)

## 2023-06-27 MED ORDER — HYDROMORPHONE HCL 2 MG PO TABS: 2.0000 mg | ORAL_TABLET | Freq: Four times a day (QID) | ORAL | 0 refills | Status: DC | PRN

## 2023-06-27 MED ORDER — CYCLOBENZAPRINE HCL 10 MG PO TABS: 10.0000 mg | ORAL_TABLET | Freq: Three times a day (TID) | ORAL | 0 refills | Status: DC | PRN

## 2023-06-27 MED ORDER — ASPIRIN 81 MG PO CHEW: 81.0000 mg | CHEWABLE_TABLET | Freq: Two times a day (BID) | ORAL | 0 refills | Status: DC

## 2023-06-27 NOTE — Progress Notes (Signed)
Physical Therapy Treatment Patient Details Name: Alyssa Montgomery MRN: [id number] DOB: Dec 15, 1960 Today's Date: 06/27/2023   History of Present Illness 63 y.o. female admitted 06/26/23 for R AA-THA. PMH: L THA 2022, back surgery, fibromyalgia, obesity, DM, neuropathy, OA, L reverse TSA 2023.    PT Comments  Pt is progressing with mobility, she tolerated increased ambulation distance of 7' with RW, no loss of balance. Pt reports difficulty using RW due to pain/weakness in R shoulder following shoulder replacement in 2023. Will plan to do stair training tomorrow.      If plan is discharge home, recommend the following: A little help with walking and/or transfers;A little help with bathing/dressing/bathroom;Assistance with cooking/housework;Help with stairs or ramp for entrance;Assist for transportation   Can travel by private vehicle        Equipment Recommendations  None recommended by PT    Recommendations for Other Services       Precautions / Restrictions Precautions Precautions: Fall Restrictions Weight Bearing Restrictions Per Provider Order: No RLE Weight Bearing Per Provider Order: Weight bearing as tolerated Other Position/Activity Restrictions: WBAT     Mobility  Bed Mobility Overal bed mobility: Modified Independent Bed Mobility: Supine to Sit     Supine to sit: Modified independent (Device/Increase time), HOB elevated, Used rails Sit to supine: Modified independent (Device/Increase time)   General bed mobility comments: HOB up, used gait belt as RLE lifter    Transfers Overall transfer level: Needs assistance Equipment used: Rolling walker (2 wheels) Transfers: Sit to/from Stand Sit to Stand: Contact guard assist           General transfer comment: VCs hand placement    Ambulation/Gait Ambulation/Gait assistance: Contact guard assist Gait Distance (Feet): 90 Feet Assistive device: Rolling walker (2 wheels) Gait Pattern/deviations: Step-to pattern,  Decreased step length - right, Decreased step length - left Gait velocity: decr     General Gait Details: VCs  sequencing, several rest breaks where pt rests forearms on RW, not open to verbal cues for safety, distance limited by pain.   Stairs             Wheelchair Mobility     Tilt Bed    Modified Rankin (Stroke Patients Only)       Balance Overall balance assessment: Modified Independent                                          Cognition Arousal: Alert Behavior During Therapy: WFL for tasks assessed/performed Overall Cognitive Status: Within Functional Limits for tasks assessed                                          Exercises Total Joint Exercises Ankle Circles/Pumps: AROM, Both, 10 reps, Supine Quad Sets: AROM, Both, 5 reps, Supine Short Arc Quad: AROM, Right, 10 reps, Supine Heel Slides: AAROM, Right, 5 reps, Supine Hip ABduction/ADduction: AAROM, Right, 5 reps, Supine    General Comments        Pertinent Vitals/Pain Pain Assessment Pain Score: 9  Pain Location: R hip Pain Descriptors / Indicators: Sore, Operative site guarding, Heaviness Pain Intervention(s): Limited activity within patient's tolerance, Monitored during session, Premedicated before session, Repositioned    Home Living  Prior Function            PT Goals (current goals can now be found in the care plan section) Acute Rehab 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 PT Goal Formulation: With patient Time For Goal Achievement: 07/03/23 Potential to Achieve Goals: Good Progress towards PT goals: Progressing toward goals    Frequency    7X/week      PT Plan      Co-evaluation              AM-PAC PT "6 Clicks" Mobility   Outcome Measure  Help needed turning from your back to your side while in a flat bed without using bedrails?: A Little Help needed moving from lying on your back  to sitting on the side of a flat bed without using bedrails?: A Little Help needed moving to and from a bed to a chair (including a wheelchair)?: A Little Help needed standing up from a chair using your arms (e.g., wheelchair or bedside chair)?: A Little Help needed to walk in hospital room?: A Little Help needed climbing 3-5 steps with a railing? : A Lot 6 Click Score: 17    End of Session Equipment Utilized During Treatment: Gait belt Activity Tolerance: Patient limited by pain Patient left: with call bell/phone within reach;in chair Nurse Communication: Mobility status PT Visit Diagnosis: Pain;Difficulty in walking, not elsewhere classified (R26.2);History of falling (Z91.81) Pain - Right/Left: Right Pain - part of body: Hip     Time: 4401-0272 PT Time Calculation (min) (ACUTE ONLY): 32 min  Charges:    $Gait Training: 8-22 mins $Therapeutic Activity: 8-22 mins PT General Charges $$ ACUTE PT VISIT: 1 Visit                     Tamala Ser PT 06/27/2023  Acute Rehabilitation Services  Office [phone]

## 2023-06-27 NOTE — Progress Notes (Signed)
Physical Therapy Treatment Patient Details Name: Alyssa Montgomery MRN: [id number] DOB: Sep 23, 1960 Today's Date: 06/27/2023   History of Present Illness 63 y.o. female admitted 06/26/23 for R AA-THA. PMH: L THA 2022, back surgery, fibromyalgia, obesity, DM, neuropathy, OA, L reverse TSA 2023.    PT Comments  Pt tolerated increased ambulation distance of 30' with RW, she took several standing rest breaks 2* pain/fatigue. Instructed pt in THA HEP. In general she is not receptive to instructions for safe techniques with mobility. She is not yet ready to DC home from a PT standpoint.      If plan is discharge home, recommend the following: A little help with walking and/or transfers;A little help with bathing/dressing/bathroom;Assistance with cooking/housework;Help with stairs or ramp for entrance;Assist for transportation   Can travel by private vehicle        Equipment Recommendations  None recommended by PT    Recommendations for Other Services       Precautions / Restrictions Precautions Precautions: Fall Restrictions Weight Bearing Restrictions Per Provider Order: No RLE Weight Bearing Per Provider Order: Weight bearing as tolerated Other Position/Activity Restrictions: WBAT     Mobility  Bed Mobility Overal bed mobility: Modified Independent Bed Mobility: Sit to Supine       Sit to supine: Modified independent (Device/Increase time)   General bed mobility comments: pt not open to instructions for safe technique, she crawled forwards into bed using gait belt as leg lifter    Transfers Overall transfer level: Needs assistance Equipment used: Rolling walker (2 wheels) Transfers: Sit to/from Stand Sit to Stand: Contact guard assist           General transfer comment: VCs hand placement, pt did not respond to VCs for correct hand placement, she pulled up on RW with BUEs (reported prior TSA prevented her from pushing up from bed)    Ambulation/Gait Ambulation/Gait  assistance: Contact guard assist Gait Distance (Feet): 30 Feet Assistive device: Rolling walker (2 wheels) Gait Pattern/deviations: Step-to pattern, Decreased step length - right, Decreased step length - left Gait velocity: decr     General Gait Details: VCs  sequencing, several rest breaks where pt rests forearms on RW, not open to verbal cues for safety, distance limited by pain.   Stairs             Wheelchair Mobility     Tilt Bed    Modified Rankin (Stroke Patients Only)       Balance Overall balance assessment: Modified Independent                                          Cognition Arousal: Alert Behavior During Therapy: WFL for tasks assessed/performed Overall Cognitive Status: Within Functional Limits for tasks assessed                                          Exercises Total Joint Exercises Ankle Circles/Pumps: AROM, Both, 10 reps, Supine Quad Sets: AROM, Both, 5 reps, Supine Short Arc Quad: AROM, Right, 10 reps, Supine Heel Slides: AAROM, Right, 5 reps, Supine Hip ABduction/ADduction: AAROM, Right, 5 reps, Supine    General Comments        Pertinent Vitals/Pain Pain Assessment Pain Score: 10-Worst pain ever Pain Location: R hip Pain Descriptors / Indicators: Sore,  Operative site guarding Pain Intervention(s): Limited activity within patient's tolerance, Monitored during session, Premedicated before session, Ice applied    Home Living                          Prior Function            PT Goals (current goals can now be found in the care plan section) Acute Rehab 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 PT Goal Formulation: With patient Time For Goal Achievement: 07/03/23 Potential to Achieve Goals: Good Progress towards PT goals: Progressing toward goals    Frequency    7X/week      PT Plan      Co-evaluation              AM-PAC PT "6 Clicks" Mobility   Outcome  Measure  Help needed turning from your back to your side while in a flat bed without using bedrails?: A Little Help needed moving from lying on your back to sitting on the side of a flat bed without using bedrails?: A Little Help needed moving to and from a bed to a chair (including a wheelchair)?: A Little Help needed standing up from a chair using your arms (e.g., wheelchair or bedside chair)?: A Little Help needed to walk in hospital room?: A Little Help needed climbing 3-5 steps with a railing? : A Lot 6 Click Score: 17    End of Session Equipment Utilized During Treatment: Gait belt Activity Tolerance: Patient limited by pain Patient left: in bed;with call bell/phone within reach Nurse Communication: Mobility status PT Visit Diagnosis: Pain;Difficulty in walking, not elsewhere classified (R26.2);History of falling (Z91.81) Pain - Right/Left: Right Pain - part of body: Hip     Time: 9147-8295 PT Time Calculation (min) (ACUTE ONLY): 35 min  Charges:    $Gait Training: 8-22 mins $Therapeutic Exercise: 8-22 mins PT General Charges $$ ACUTE PT VISIT: 1 Visit                     Tamala Ser PT 06/27/2023  Acute Rehabilitation Services  Office [phone]

## 2023-06-27 NOTE — TOC Transition Note (Signed)
Transition of Care [facility]) - Discharge Note   Patient Details  Name: Alyssa Montgomery MRN: [id number] Date of Birth: [birth year]  Transition of Care [facility]) CM/SW Contact:  Howell Rucks, RN Phone Number: 06/27/2023, 10:30 AM   Clinical Narrative:   Met with pt at bedside to review dc therapy follow up and home DME, confirmed HEP, no home DME needs. No TOC needs.     Final next level of care: Home/Self Care Barriers to Discharge: No Barriers Identified   Patient Goals and CMS Choice Patient states their goals for this hospitalization and ongoing recovery are:: return home          Discharge Placement                       Discharge Plan and Services Additional resources added to the After Visit Summary for                                       Social Drivers of Health (SDOH) Interventions SDOH Screenings   Food Insecurity: No Food Insecurity (06/26/2023)  Housing: Patient Declined (06/26/2023)  Transportation Needs: Patient Declined (06/26/2023)  Utilities: Patient Declined (06/26/2023)  Alcohol Screen: Low Risk  (02/26/2023)  Depression (PHQ2-9): Low Risk  (05/21/2023)  Financial Resource Strain: Low Risk  (02/26/2023)  Physical Activity: Insufficiently Active (02/26/2023)  Social Connections: Unknown (06/26/2023)  Stress: No Stress Concern Present (02/26/2023)  Tobacco Use: Medium Risk (06/26/2023)  Health Literacy: Adequate Health Literacy (02/26/2023)     Readmission Risk Interventions     No data to display

## 2023-06-27 NOTE — Plan of Care (Signed)
  Problem: Pain Management: Goal: Pain level will decrease with appropriate interventions Outcome: Progressing   Problem: Coping: Goal: Level of anxiety will decrease Outcome: Progressing   Problem: Elimination: Goal: Will not experience complications related to bowel motility Outcome: Progressing Goal: Will not experience complications related to urinary retention Outcome: Progressing   

## 2023-06-27 NOTE — Care Management Obs Status (Signed)
MEDICARE OBSERVATION STATUS NOTIFICATION   Patient Details  Name: LAFRANCE SCHWICKERATH MRN: [id number] Date of Birth: Oct 23, 1960   Medicare Observation Status Notification Given:       Howell Rucks, RN 06/27/2023, 10:11 AM

## 2023-06-27 NOTE — Plan of Care (Signed)
  Problem: Education: Goal: Knowledge of the prescribed therapeutic regimen will improve Outcome: Progressing Goal: Understanding of activity limitations/precautions following surgery will improve Outcome: Progressing Goal: Individualized Educational Video(s) Outcome: Progressing   Problem: Activity: Goal: Ability to tolerate increased activity will improve Outcome: Progressing   Problem: Clinical Measurements: Goal: Ability to maintain clinical measurements within normal limits will improve Outcome: Progressing Goal: Will remain free from infection Outcome: Progressing Goal: Diagnostic test results will improve Outcome: Progressing Goal: Respiratory complications will improve Outcome: Progressing Goal: Cardiovascular complication will be avoided Outcome: Progressing

## 2023-06-27 NOTE — Progress Notes (Signed)
Subjective: 1 Day Post-Op Procedure(s) (LRB): TOTAL HIP ARTHROPLASTY ANTERIOR APPROACH (Right) Patient reports pain as moderate.   Patient seen in rounds by Dr. Lequita Halt. Patient had issues with pain yesterday. On chronic pain medication, so we expected this. Slightly better this AM. We will continue therapy today.   Objective: Vital signs in last 24 hours: Temp:  [97.4 F (36.3 C)-98 F (36.7 C)] 97.6 F (36.4 C) (01/16 0514) Pulse Rate:  [58-80] 78 (01/16 0514) Resp:  [10-18] 17 (01/16 0514) BP: (106-164)/(43-92) 138/72 (01/16 0514) SpO2:  [95 %-100 %] 97 % (01/16 0514) Weight:  [105.7 kg] 105.7 kg (01/15 0853)  Intake/Output from previous day:  Intake/Output Summary (Last 24 hours) at 06/27/2023 0737 Last data filed at 06/27/2023 0600 Gross per 24 hour  Intake 3567 ml  Output 800 ml  Net 2767 ml     Intake/Output this shift: No intake/output data recorded.  Labs: Recent Labs    06/27/23 0351  HGB 11.5*   Recent Labs    06/27/23 0351  WBC 13.2*  RBC 4.49  HCT 37.8  PLT 250   Recent Labs    06/27/23 0351  NA 135  K 4.0  CL 102  CO2 23  BUN 14  CREATININE 0.76  GLUCOSE 210*  CALCIUM 9.2   No results for input(s): "LABPT", "INR" in the last 72 hours.  Exam: General - Patient is Alert and Oriented Extremity - Neurologically intact Neurovascular intact Sensation intact distally Dorsiflexion/Plantar flexion intact Dressing - dressing C/D/I Motor Function - intact, moving foot and toes well on exam.   Past Medical History:  Diagnosis Date   Anxiety    Bilateral carpal tunnel syndrome 03/03/2018   Bronchitis    Cervical spondylosis without myelopathy 07/29/2018   Cervicalgia 11/16/2014   Cervicogenic headache 07/29/2018   Chronic bilateral low back pain with bilateral sciatica 09/18/2018   Claudication (HCC) 09/03/2016   Costochondritis 02/08/2022   Diabetes mellitus without complication (HCC)    Diverticulitis    FIBROIDS, UTERUS  03/09/2010   Qualifier: Diagnosis of  By: Leone Payor MD, Alfonse Ras E    Fibromyalgia 06/18/2014   Gait abnormality 11/22/2020   GERD (gastroesophageal reflux disease)    Goiter 02/28/2013   Headache    History of cervical dysplasia 01/18/2016   Hyperlipidemia    Left carpal tunnel syndrome 01/27/2018   Left foot pain 12/20/2019   Left hip pain 11/23/2019   Neck pain 11/22/2020   Neuropathy    Non-seasonal allergic rhinitis 07/13/2019   Pain in right ankle and joints of right foot 03/17/2019   Plantar fasciitis    Primary osteoarthritis of left hip 09/12/2020   Right leg numbness 11/22/2020   Spinal stenosis of cervical region 07/29/2018   Substance abuse (HCC)    10 years ago ( cocaine)   TIA (transient ischemic attack) 08/10/2016   Tobacco use disorder 04/17/2015   Trochanteric bursitis of left hip 11/23/2019   Urinary frequency 11/30/2019   UTI (urinary tract infection) 06/19/2012   Viral URI with cough 02/28/2013    Assessment/Plan: 1 Day Post-Op Procedure(s) (LRB): TOTAL HIP ARTHROPLASTY ANTERIOR APPROACH (Right) Principal Problem:   OA (osteoarthritis) of hip Active Problems:   Osteoarthritis of right hip  Estimated body mass index is 38.77 kg/m as calculated from the following:   Height as of this encounter: 5\' 5"  (1.651 m).   Weight as of this encounter: 105.7 kg. Advance diet Up with therapy D/C IV fluids  DVT Prophylaxis - Aspirin Weight  bearing as tolerated. Continue therapy.  Plan is to go Home after hospital stay. Plan for discharge later today with HEP if cleared with therapy May possibly require additional night if mobility not safe or pain not controlled well  Follow-up in the office in 2 weeks  The PDMP database was reviewed today prior to any opioid medications being prescribed to this patient.   Arther Abbott, PA-C Orthopedic Surgery [phone] 06/27/2023, 7:37 AM

## 2023-06-28 ENCOUNTER — Observation Stay (HOSPITAL_COMMUNITY): Payer: 59

## 2023-06-28 DIAGNOSIS — M1611 Unilateral primary osteoarthritis, right hip: Secondary | ICD-10-CM | POA: Diagnosis not present

## 2023-06-28 LAB — CBC
HCT: 35.8 % — ABNORMAL LOW (ref 36.0–46.0)
Hemoglobin: 10.7 g/dL — ABNORMAL LOW (ref 12.0–15.0)
MCH: 25.6 pg — ABNORMAL LOW (ref 26.0–34.0)
MCHC: 29.9 g/dL — ABNORMAL LOW (ref 30.0–36.0)
MCV: 85.6 fL (ref 80.0–100.0)
Platelets: 237 10*3/uL (ref 150–400)
RBC: 4.18 MIL/uL (ref 3.87–5.11)
RDW: 15.1 % (ref 11.5–15.5)
WBC: 13.7 10*3/uL — ABNORMAL HIGH (ref 4.0–10.5)
nRBC: 0 % (ref 0.0–0.2)

## 2023-06-28 LAB — URINALYSIS, ROUTINE W REFLEX MICROSCOPIC
Bilirubin Urine: NEGATIVE
Glucose, UA: NEGATIVE mg/dL
Hgb urine dipstick: NEGATIVE
Ketones, ur: NEGATIVE mg/dL
Leukocytes,Ua: NEGATIVE
Nitrite: NEGATIVE
Protein, ur: NEGATIVE mg/dL
Specific Gravity, Urine: 1.024 (ref 1.005–1.030)
pH: 5 (ref 5.0–8.0)

## 2023-06-28 LAB — GLUCOSE, CAPILLARY
Glucose-Capillary: 140 mg/dL — ABNORMAL HIGH (ref 70–99)
Glucose-Capillary: 153 mg/dL — ABNORMAL HIGH (ref 70–99)
Glucose-Capillary: 150 mg/dL — ABNORMAL HIGH (ref 70–99)

## 2023-06-28 MED ORDER — TRAMADOL HCL 50 MG PO TABS: 50.0000 mg | ORAL_TABLET | Freq: Four times a day (QID) | ORAL | 0 refills | Status: AC | PRN

## 2023-06-28 MED ORDER — OXYCODONE HCL 5 MG PO TABS: 5.0000 mg | ORAL_TABLET | Freq: Four times a day (QID) | ORAL | 0 refills | Status: AC | PRN

## 2023-06-28 MED ORDER — BENZONATATE 100 MG PO CAPS: 100.0000 mg | ORAL_CAPSULE | Freq: Four times a day (QID) | ORAL | 0 refills | Status: DC | PRN

## 2023-06-28 MED ORDER — OXYCODONE HCL 5 MG PO TABS: 5.0000 mg | ORAL_TABLET | Freq: Four times a day (QID) | ORAL | 0 refills | Status: DC | PRN

## 2023-06-28 MED ORDER — TRAMADOL HCL 50 MG PO TABS: 50.0000 mg | ORAL_TABLET | Freq: Four times a day (QID) | ORAL | 0 refills | Status: DC | PRN

## 2023-06-28 MED ORDER — PROMETHAZINE HCL 12.5 MG PO TABS: 12.5000 mg | ORAL_TABLET | Freq: Four times a day (QID) | ORAL | 0 refills | Status: AC | PRN

## 2023-06-28 MED ORDER — PROMETHAZINE HCL 12.5 MG PO TABS: 12.5000 mg | ORAL_TABLET | Freq: Four times a day (QID) | ORAL | 0 refills | Status: DC | PRN

## 2023-06-28 MED ORDER — ASPIRIN 81 MG PO CHEW: 81.0000 mg | CHEWABLE_TABLET | Freq: Two times a day (BID) | ORAL | 0 refills | Status: AC

## 2023-06-28 MED ORDER — CYCLOBENZAPRINE HCL 10 MG PO TABS: 10.0000 mg | ORAL_TABLET | Freq: Three times a day (TID) | ORAL | 0 refills | Status: AC | PRN

## 2023-06-28 NOTE — Progress Notes (Signed)
Physical Therapy Treatment Patient Details Name: Alyssa Montgomery MRN: [id number] DOB: May 19, 1961 Today's Date: 06/28/2023   History of Present Illness 63 y.o. female admitted 06/26/23 for R AA-THA. PMH: L THA 2022, back surgery, fibromyalgia, obesity, DM, neuropathy, OA, L reverse TSA 2023.    PT Comments  Returned to review THA HEP. Pt stated, "You can talk to me about the exercises, but I'm not going to do them". With encouragement, pt performed HEP. She demonstrates good understanding. She is ready to DC home from a PT standpoint. Pt continues to express concern about pain management.     If plan is discharge home, recommend the following: A little help with bathing/dressing/bathroom;Assistance with cooking/housework;Help with stairs or ramp for entrance;Assist for transportation   Can travel by private vehicle        Equipment Recommendations  None recommended by PT (issued donated RW as pt stated her husband can't find her walker in the attic)    Recommendations for Other Services       Precautions / Restrictions Precautions Precautions: Fall Restrictions Weight Bearing Restrictions Per Provider Order: No RLE Weight Bearing Per Provider Order: Weight bearing as tolerated Other Position/Activity Restrictions: WBAT        Balance Overall balance assessment: Modified Independent                                          Cognition Arousal: Alert Behavior During Therapy: WFL for tasks assessed/performed Overall Cognitive Status: Within Functional Limits for tasks assessed                                          Exercises Total Joint Exercises Ankle Circles/Pumps: AROM, Both, 10 reps, Supine Quad Sets: AROM, Both, 5 reps, Supine Short Arc Quad: AROM, Right, 10 reps, Supine Heel Slides: AAROM, Right, 5 reps, Supine Hip ABduction/ADduction: AAROM, Right, 5 reps, Supine    General Comments        Pertinent Vitals/Pain Pain  Assessment Pain Score: 10-Worst pain ever Pain Location: R hip Pain Descriptors / Indicators: Sore, Operative site guarding, Heaviness Pain Intervention(s): Limited activity within patient's tolerance, Monitored during session, Repositioned, Premedicated before session, Ice applied    Home Living                          Prior Function            PT Goals (current goals can now be found in the care plan section) Acute Rehab 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 PT Goal Formulation: With patient Time For Goal Achievement: 07/03/23 Potential to Achieve Goals: Good Progress towards PT goals: Progressing toward goals    Frequency    7X/week      PT Plan      Co-evaluation              AM-PAC PT "6 Clicks" Mobility   Outcome Measure  Help needed turning from your back to your side while in a flat bed without using bedrails?: None Help needed moving from lying on your back to sitting on the side of a flat bed without using bedrails?: None Help needed moving to and from a bed to a chair (including a wheelchair)?: None Help needed standing  up from a chair using your arms (e.g., wheelchair or bedside chair)?: None Help needed to walk in hospital room?: None Help needed climbing 3-5 steps with a railing? : A Little 6 Click Score: 23    End of Session Equipment Utilized During Treatment: Gait belt Activity Tolerance: Patient limited by pain Patient left: with call bell/phone within reach;in bed Nurse Communication: Mobility status PT Visit Diagnosis: Pain;Difficulty in walking, not elsewhere classified (R26.2);History of falling (Z91.81) Pain - Right/Left: Right Pain - part of body: Hip     Time: 1211-1221 PT Time Calculation (min) (ACUTE ONLY): 10 min  Charges:     $Therapeutic Exercise: 8-22 mins  PT General Charges $$ ACUTE PT VISIT: 1 Visit                     Tamala Ser PT 06/28/2023  Acute Rehabilitation  Services  Office [phone]

## 2023-06-28 NOTE — Progress Notes (Signed)
Subjective: 2 Days Post-Op Procedure(s) (LRB): TOTAL HIP ARTHROPLASTY ANTERIOR APPROACH (Right) Patient seen in rounds for Dr. Lequita Halt. Patient resting comfortably in bed. Reports slight increase in pain and discomfort this morning after getting out of bed and showering and doing a bit more. Denies SOB or chest pain. Denies calf pain.  Objective: Vital signs in last 24 hours: Temp:  [98 F (36.7 C)-99 F (37.2 C)] 98 F (36.7 C) (01/17 0452) Pulse Rate:  [68-82] 79 (01/17 0452) Resp:  [16-17] 16 (01/17 0452) BP: (123-150)/(63-96) 123/92 (01/17 0452) SpO2:  [93 %-100 %] 97 % (01/17 0452)  Intake/Output from previous day:  Intake/Output Summary (Last 24 hours) at 06/28/2023 0824 Last data filed at 06/28/2023 0600 Gross per 24 hour  Intake 960 ml  Output --  Net 960 ml    Intake/Output this shift: No intake/output data recorded.  Labs: Recent Labs    06/27/23 0351 06/28/23 0406  HGB 11.5* 10.7*   Recent Labs    06/27/23 0351 06/28/23 0406  WBC 13.2* 13.7*  RBC 4.49 4.18  HCT 37.8 35.8*  PLT 250 237   Recent Labs    06/27/23 0351  NA 135  K 4.0  CL 102  CO2 23  BUN 14  CREATININE 0.76  GLUCOSE 210*  CALCIUM 9.2   No results for input(s): "LABPT", "INR" in the last 72 hours.  Exam: General - Patient is Alert and Oriented Extremity - Neurologically intact Neurovascular intact Sensation intact distally Dorsiflexion/Plantar flexion intact Dressing/Incision - clean, dry, no drainage Motor Function - intact, moving foot and toes well on exam.  Past Medical History:  Diagnosis Date   Anxiety    Bilateral carpal tunnel syndrome 03/03/2018   Bronchitis    Cervical spondylosis without myelopathy 07/29/2018   Cervicalgia 11/16/2014   Cervicogenic headache 07/29/2018   Chronic bilateral low back pain with bilateral sciatica 09/18/2018   Claudication (HCC) 09/03/2016   Costochondritis 02/08/2022   Diabetes mellitus without complication (HCC)     Diverticulitis    FIBROIDS, UTERUS 03/09/2010   Qualifier: Diagnosis of  By: Leone Payor MD, Alfonse Ras E    Fibromyalgia 06/18/2014   Gait abnormality 11/22/2020   GERD (gastroesophageal reflux disease)    Goiter 02/28/2013   Headache    History of cervical dysplasia 01/18/2016   Hyperlipidemia    Left carpal tunnel syndrome 01/27/2018   Left foot pain 12/20/2019   Left hip pain 11/23/2019   Neck pain 11/22/2020   Neuropathy    Non-seasonal allergic rhinitis 07/13/2019   Pain in right ankle and joints of right foot 03/17/2019   Plantar fasciitis    Primary osteoarthritis of left hip 09/12/2020   Right leg numbness 11/22/2020   Spinal stenosis of cervical region 07/29/2018   Substance abuse (HCC)    10 years ago ( cocaine)   TIA (transient ischemic attack) 08/10/2016   Tobacco use disorder 04/17/2015   Trochanteric bursitis of left hip 11/23/2019   Urinary frequency 11/30/2019   UTI (urinary tract infection) 06/19/2012   Viral URI with cough 02/28/2013    Assessment/Plan: 2 Days Post-Op Procedure(s) (LRB): TOTAL HIP ARTHROPLASTY ANTERIOR APPROACH (Right) Principal Problem:   OA (osteoarthritis) of hip Active Problems:   Osteoarthritis of right hip  Estimated body mass index is 38.77 kg/m as calculated from the following:   Height as of this encounter: 5\' 5"  (1.651 m).   Weight as of this encounter: 105.7 kg.  DVT Prophylaxis - Aspirin Weight-bearing as tolerated.  Continue physical  therapy for stair training. Expected discharge home today pending progress. Will do HEP once discharged. F/u in clinic in 2 weeks.  Alfonzo Feller, PA-C Orthopedic Surgery 06/28/2023, 8:24 AM

## 2023-06-28 NOTE — Care Management Obs Status (Signed)
MEDICARE OBSERVATION STATUS NOTIFICATION   Patient Details  Name: Alyssa Montgomery MRN: [id number] Date of Birth: 26-Apr-1961   Medicare Observation Status Notification Given:  Yes    Armanda Heritage, RN 06/28/2023, 12:03 PM

## 2023-06-28 NOTE — Progress Notes (Signed)
Physical Therapy Treatment Patient Details Name: Alyssa Montgomery MRN: [id number] DOB: Jan 27, 1961 Today's Date: 06/28/2023   History of Present Illness 63 y.o. female admitted 06/26/23 for R AA-THA. PMH: L THA 2022, back surgery, fibromyalgia, obesity, DM, neuropathy, OA, L reverse TSA 2023.    PT Comments  Stair training completed. Pt would like to review HEP one more time, but didn't feel she could tolerate exercises this morning. Will plan to do HEP review this afternoon, then I expect she'll be ready to DC home from a PT standpoint. Pt c/o "50/10" pain in R hip and lateral thigh, she stated she, "can't go home like this".  Pt had IV and oral pain medication prior to PT session. Ice applied to R hip, heat to R thigh.     If plan is discharge home, recommend the following: A little help with bathing/dressing/bathroom;Assistance with cooking/housework;Help with stairs or ramp for entrance;Assist for transportation   Can travel by private vehicle        Equipment Recommendations  None recommended by PT (issued donated RW as pt stated her husband can't find her walker in the attic)    Recommendations for Other Services       Precautions / Restrictions Precautions Precautions: Fall Restrictions Weight Bearing Restrictions Per Provider Order: No RLE Weight Bearing Per Provider Order: Weight bearing as tolerated Other Position/Activity Restrictions: WBAT     Mobility  Bed Mobility Overal bed mobility: Modified Independent Bed Mobility: Supine to Sit, Sit to Supine     Supine to sit: Modified independent (Device/Increase time), HOB elevated, Used rails Sit to supine: Modified independent (Device/Increase time)   General bed mobility comments: HOB up, used gait belt as RLE lifter; climbs back into bed forwards (not receptive to cues for safer technique)    Transfers Overall transfer level: Needs assistance Equipment used: Rolling walker (2 wheels) Transfers: Sit to/from  Stand Sit to Stand: Supervision           General transfer comment: VCs hand placement    Ambulation/Gait Ambulation/Gait assistance: Supervision Gait Distance (Feet): 80 Feet Assistive device: Rolling walker (2 wheels)   Gait velocity: decr     General Gait Details: VCs  sequencing, several rest breaks where pt rests forearms on RW, not open to verbal cues for safety, distance limited by pain.   Stairs Stairs: Yes Stairs assistance: Contact guard assist Stair Management: One rail Left, Forwards, With cane Number of Stairs: 3 General stair comments: VCs sequencing, several standing rest breaks 2* pain   Wheelchair Mobility     Tilt Bed    Modified Rankin (Stroke Patients Only)       Balance Overall balance assessment: Modified Independent                                          Cognition Arousal: Alert Behavior During Therapy: WFL for tasks assessed/performed Overall Cognitive Status: Within Functional Limits for tasks assessed                                          Exercises      General Comments        Pertinent Vitals/Pain Pain Assessment Pain Score: 10-Worst pain ever Pain Location: "50/10" R lateral hip with activity Pain Descriptors / Indicators: Sore, Operative  site guarding, Heaviness Pain Intervention(s): Limited activity within patient's tolerance, Monitored during session, Premedicated before session, Ice applied    Home Living                          Prior Function            PT Goals (current goals can now be found in the care plan section) Acute Rehab 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 PT Goal Formulation: With patient Time For Goal Achievement: 07/03/23 Potential to Achieve Goals: Good Progress towards PT goals: Progressing toward goals    Frequency    7X/week      PT Plan      Co-evaluation              AM-PAC PT "6 Clicks" Mobility    Outcome Measure  Help needed turning from your back to your side while in a flat bed without using bedrails?: None Help needed moving from lying on your back to sitting on the side of a flat bed without using bedrails?: None Help needed moving to and from a bed to a chair (including a wheelchair)?: None Help needed standing up from a chair using your arms (e.g., wheelchair or bedside chair)?: None Help needed to walk in hospital room?: None Help needed climbing 3-5 steps with a railing? : A Little 6 Click Score: 23    End of Session Equipment Utilized During Treatment: Gait belt Activity Tolerance: Patient limited by pain Patient left: with call bell/phone within reach;in bed Nurse Communication: Mobility status PT Visit Diagnosis: Pain;Difficulty in walking, not elsewhere classified (R26.2);History of falling (Z91.81) Pain - Right/Left: Right Pain - part of body: Hip     Time: 0960-4540 PT Time Calculation (min) (ACUTE ONLY): 24 min  Charges:    $Gait Training: 8-22 mins $Therapeutic Activity: 8-22 mins PT General Charges $$ ACUTE PT VISIT: 1 Visit                     Tamala Ser PT 06/28/2023  Acute Rehabilitation Services  Office [phone]

## 2023-07-08 ENCOUNTER — Encounter: Payer: Self-pay | Admitting: Family Medicine

## 2023-07-08 ENCOUNTER — Ambulatory Visit (INDEPENDENT_AMBULATORY_CARE_PROVIDER_SITE_OTHER): Payer: 59 | Admitting: Family Medicine

## 2023-07-08 VITALS — Ht 65.0 in | Wt 237.0 lb

## 2023-07-08 DIAGNOSIS — M25551 Pain in right hip: Secondary | ICD-10-CM

## 2023-07-08 DIAGNOSIS — Z7985 Long-term (current) use of injectable non-insulin antidiabetic drugs: Secondary | ICD-10-CM

## 2023-07-08 DIAGNOSIS — E119 Type 2 diabetes mellitus without complications: Secondary | ICD-10-CM

## 2023-07-08 DIAGNOSIS — E669 Obesity, unspecified: Secondary | ICD-10-CM

## 2023-07-08 MED ORDER — MOUNJARO 2.5 MG/0.5ML ~~LOC~~ SOAJ: 2.5000 mg | SUBCUTANEOUS | 0 refills | Status: DC

## 2023-07-08 NOTE — Patient Instructions (Signed)
Please start taking Mounjaro weekly for the next 4 weeks then follow-up with me  Let me know if you start having any abdominal pain, vomiting, nausea, or other reaction to the medication

## 2023-07-08 NOTE — Assessment & Plan Note (Signed)
On metformin 1000 mg nightly, last A1c 7.3.  Given her obesity and diabetes feel she would benefit from addition of Mounjaro.  Started 2.5 mg weekly, follow-up in 4 weeks for titration.  Discussed return precautions

## 2023-07-08 NOTE — Progress Notes (Signed)
    SUBJECTIVE:   CHIEF COMPLAINT / HPI:   R hip pain x 2 weeks since total hip arthroplasty on 1/15 Was taking oxycodone and tramadol with relief but she ran out of this She has tried calling her orthopedic office but they have not been answering Has been changing bandages, has not been bleeding. No fevers    Interested in discussing weight loss medication Mounjaro Has attempted weight loss in the past with exercise, lifestyle and diet changes without relief Recently underwent major orthopedic surgery of her hip which also limits her ability to exercise  History of diabetes on metformin 1000 mg nightly.  Last A1c 7.3 on January 6   PERTINENT  PMH / PSH: Type 2 diabetes, obesity  OBJECTIVE:   Ht 5\' 5"  (1.651 m)   Wt 237 lb (107.5 kg)   BMI 39.44 kg/m   General: NAD, pleasant, able to participate in exam Cardiac: RRR, no murmurs auscultated Respiratory: CTAB, normal WOB Abdomen: soft, non-tender, non-distended, normoactive bowel sounds Extremities: Right hip with bandage in place over incision site, no significant erythema, discharge, or bleeding noted around the area Skin: warm and dry, no rashes noted Neuro: alert, no obvious focal deficits, speech normal Psych: Normal affect and mood  ASSESSMENT/PLAN:   Assessment & Plan Diabetes mellitus without complication (HCC) On metformin 1000 mg nightly, last A1c 7.3.  Given her obesity and diabetes feel she would benefit from addition of Mounjaro.  Started 2.5 mg weekly, follow-up in 4 weeks for titration.  Discussed return precautions Pain of right hip Patient was actually able to call her ortho office in the room today and they will be sending in oxycodone 10mg . Has follow up scheduled on Wednesday   Vonna Drafts, MD [facility]

## 2023-07-09 ENCOUNTER — Other Ambulatory Visit: Payer: Self-pay | Admitting: Family Medicine

## 2023-07-09 NOTE — Discharge Summary (Signed)
Patient ID: Alyssa Montgomery MRN: [id number] DOB/AGE: 1961-01-25 63 y.o.  Admit date: 06/26/2023 Discharge date: 06/28/2023  Admission Diagnoses:  Principal Problem:   OA (osteoarthritis) of hip Active Problems:   Osteoarthritis of right hip   Discharge Diagnoses:  Same  Past Medical History:  Diagnosis Date   Anxiety    Bilateral carpal tunnel syndrome 03/03/2018   Bronchitis    Cervical spondylosis without myelopathy 07/29/2018   Cervicalgia 11/16/2014   Cervicogenic headache 07/29/2018   Chronic bilateral low back pain with bilateral sciatica 09/18/2018   Claudication (HCC) 09/03/2016   Costochondritis 02/08/2022   Diabetes mellitus without complication (HCC)    Diverticulitis    FIBROIDS, UTERUS 03/09/2010   Qualifier: Diagnosis of  By: Leone Payor MD, Alfonse Ras E    Fibromyalgia 06/18/2014   Gait abnormality 11/22/2020   GERD (gastroesophageal reflux disease)    Goiter 02/28/2013   Headache    History of cervical dysplasia 01/18/2016   Hyperlipidemia    Left carpal tunnel syndrome 01/27/2018   Left foot pain 12/20/2019   Left hip pain 11/23/2019   Neck pain 11/22/2020   Neuropathy    Non-seasonal allergic rhinitis 07/13/2019   Pain in right ankle and joints of right foot 03/17/2019   Plantar fasciitis    Primary osteoarthritis of left hip 09/12/2020   Right leg numbness 11/22/2020   Spinal stenosis of cervical region 07/29/2018   Substance abuse (HCC)    10 years ago ( cocaine)   TIA (transient ischemic attack) 08/10/2016   Tobacco use disorder 04/17/2015   Trochanteric bursitis of left hip 11/23/2019   Urinary frequency 11/30/2019   UTI (urinary tract infection) 06/19/2012   Viral URI with cough 02/28/2013    Surgeries: Procedure(s): TOTAL HIP ARTHROPLASTY ANTERIOR APPROACH on 06/26/2023   Consultants:   Discharged Condition: Improved  Hospital Course: Alyssa Montgomery is an 63 y.o. female who was admitted 06/26/2023 for operative treatment ofOA  (osteoarthritis) of hip. Patient has severe unremitting pain that affects sleep, daily activities, and work/hobbies. After pre-op clearance the patient was taken to the operating room on 06/26/2023 and underwent  Procedure(s): TOTAL HIP ARTHROPLASTY ANTERIOR APPROACH.    Patient was given perioperative antibiotics:  Anti-infectives (From admission, onward)    Start     Dose/Rate Route Frequency Ordered Stop   06/26/23 1800  ceFAZolin (ANCEF) IVPB 2g/100 mL premix        2 g 200 mL/hr over 30 Minutes Intravenous Every 6 hours 06/26/23 1522 06/27/23 1040   06/26/23 0900  ceFAZolin (ANCEF) IVPB 2g/100 mL premix        2 g 200 mL/hr over 30 Minutes Intravenous On call to O.R. 06/26/23 0850 06/26/23 1138        Patient was given sequential compression devices, early ambulation, and chemoprophylaxis to prevent DVT.  Patient benefited maximally from hospital stay and there were no complications.    Recent vital signs: No data found.   Recent laboratory studies: No results for input(s): "WBC", "HGB", "HCT", "PLT", "NA", "K", "CL", "CO2", "BUN", "CREATININE", "GLUCOSE", "INR", "CALCIUM" in the last 72 hours.  Invalid input(s): "PT", "2"   Discharge Medications:   Allergies as of 06/28/2023       Reactions   Zanaflex [tizanidine Hcl] Other (See Comments)   Syncope   Penicillins Other (See Comments)   Unknown childhood reaction. Has patient had a PCN reaction causing immediate rash, facial/tongue/throat swelling, SOB or lightheadedness with hypotension: Unknown Has patient had a PCN reaction causing severe  rash involving mucus membranes or skin necrosis: Unknown Has patient had a PCN reaction that required hospitalization: Unknown Has patient had a PCN reaction occurring within the last 10 years: No Tolerated Cephalosporin Date: 09/14/20. Tolerated Cephalosporin Date: 06/27/23.   Sulfonamide Derivatives Itching        Medication List     STOP taking these medications     diclofenac Sodium 1 % Gel Commonly known as: Voltaren   famotidine 20 MG tablet Commonly known as: PEPCID   miconazole 2 % vaginal cream Commonly known as: Miconazole 7   naproxen 500 MG tablet Commonly known as: NAPROSYN   rosuvastatin 5 MG tablet Commonly known as: Crestor       TAKE these medications    albuterol 108 (90 Base) MCG/ACT inhaler Commonly known as: VENTOLIN HFA INHALE 2 PUFFS INTO THE LUNGS EVERY 6 HOURS AS NEEDED FOR WHEEZING/SHORTNESS OF BREATH   aspirin 81 MG chewable tablet Chew 1 tablet (81 mg total) by mouth 2 (two) times daily for 19 days. Then take one 81 mg aspirin once a day for three weeks. Then discontinue aspirin.   benzonatate 100 MG capsule Commonly known as: Tessalon Perles Take 1 capsule (100 mg total) by mouth every 6 (six) hours as needed for cough.   budesonide-formoterol 160-4.5 MCG/ACT inhaler Commonly known as: Symbicort Inhale 2 puffs into the lungs in the morning and at bedtime.   cyclobenzaprine 10 MG tablet Commonly known as: FLEXERIL Take 1 tablet (10 mg total) by mouth 3 (three) times daily as needed for muscle spasms. What changed: when to take this   ECHINACEA PO Take 1 capsule by mouth daily.   ELDERBERRY PO Take 1 capsule by mouth daily.   fluticasone 50 MCG/ACT nasal spray Commonly known as: FLONASE SPRAY 1 SPRAY INTO BOTH NOSTRILS DAILY.   lansoprazole 30 MG capsule Commonly known as: PREVACID Take 1 capsule (30 mg total) by mouth daily.   latanoprost 0.005 % ophthalmic solution Commonly known as: XALATAN Place 1 drop into the left eye at bedtime.   lidocaine 5 % Commonly known as: Lidoderm Place 1 patch onto the skin daily as needed (pain). Remove & Discard patch within 12 hours or as directed by MD   metFORMIN 500 MG 24 hr tablet Commonly known as: GLUCOPHAGE-XR TAKE 2 TABLETS (1,000 MG TOTAL) BY MOUTH EVERY EVENING.   MORINGA OLEIFERA PO Take 1 tablet by mouth daily.   multivitamin-lutein  Caps capsule Take 1 capsule by mouth daily.   oxyCODONE 5 MG immediate release tablet Commonly known as: Roxicodone Take 1-2 tablets (5-10 mg total) by mouth every 6 (six) hours as needed for severe pain (pain score 7-10). What changed:  medication strength how much to take reasons to take this   promethazine 12.5 MG tablet Commonly known as: PHENERGAN Take 1 tablet (12.5 mg total) by mouth every 6 (six) hours as needed for nausea, vomiting or refractory nausea / vomiting. What changed: Another medication with the same name was added. Make sure you understand how and when to take each.   promethazine 12.5 MG tablet Commonly known as: PHENERGAN Take 1 tablet (12.5 mg total) by mouth every 6 (six) hours as needed for nausea or vomiting. What changed: You were already taking a medication with the same name, and this prescription was added. Make sure you understand how and when to take each.   promethazine-dextromethorphan 6.25-15 MG/5ML syrup Commonly known as: PROMETHAZINE-DM Take 5 mLs by mouth 2 (two) times daily as needed for  cough.   TIGER BALM ARTHRITIS RUB EX Apply topically.   traMADol 50 MG tablet Commonly known as: ULTRAM Take 1-2 tablets (50-100 mg total) by mouth every 6 (six) hours as needed for moderate pain (pain score 4-6).   VITAMIN D3 PO Take 1 tablet by mouth daily.   zinc gluconate 50 MG tablet Take 50 mg by mouth daily.               Discharge Care Instructions  (From admission, onward)           Start     Ordered   06/27/23 0000  Weight bearing as tolerated        06/27/23 0740   06/27/23 0000  Change dressing       Comments: You have an adhesive waterproof bandage over the incision. Leave this in place until your first follow-up appointment. Once you remove this you will not need to place another bandage.   06/27/23 0740            Diagnostic Studies: DG Chest 2 View Result Date: 06/28/2023 CLINICAL DATA: Wheezing, weakness EXAM:  CHEST - 2 VIEW COMPARISON:  10/04/2022 FINDINGS: Frontal and lateral views of the chest are obtained on 3 images. The cardiac silhouette is stable. No acute airspace disease, effusion, or pneumothorax. Stable left shoulder arthroplasty. Marked degenerative changes of the right shoulder. No acute bony abnormalities. IMPRESSION: 1. No acute intrathoracic process. Electronically Signed   By: Sharlet Salina M.D.   On: 06/28/2023 19:10   DG Pelvis Portable Result Date: 06/26/2023 CLINICAL DATA:  Status post total right hip arthroplasty. EXAM: PORTABLE PELVIS 1-2 VIEWS COMPARISON:  Frontal view of the bilateral hips 09/12/2020 FINDINGS: Interval total right hip arthroplasty. Redemonstration of prior total left hip arthroplasty. No perihardware lucency is seen to indicate hardware failure or loosening. Appropriate alignment on frontal view. Expected lateral right hip postsurgical subcutaneous air. Mild bilateral sacroiliac subchondral sclerosis. Moderate pubic symphysis joint space narrowing and peripheral osteophytosis. IMPRESSION: Interval total right hip arthroplasty without evidence of hardware failure. Electronically Signed   By: Neita Garnet M.D.   On: 06/26/2023 15:38   DG HIP UNILAT WITH PELVIS 1V RIGHT Result Date: 06/26/2023 CLINICAL DATA:  Status post total right hip arthroplasty. Elective surgery. EXAM: DG HIP (WITH OR WITHOUT PELVIS) 1V RIGHT COMPARISON:  Pelvis and bilateral hip radiographs 05/21/2023 FINDINGS: Images were performed intraoperatively without the presence of a radiologist. The patient is undergoing total right hip arthroplasty. Partial visualization of prior total left hip arthroplasty. No hardware complication is seen. Total fluoroscopy images: 6 Total fluoroscopy time: 6 seconds Total dose: Radiation Exposure Index (as provided by the fluoroscopic device): 1.9 mGy air Kerma Please see intraoperative findings for further detail. IMPRESSION: Intraoperative fluoroscopy for total right hip  arthroplasty. Electronically Signed   By: Neita Garnet M.D.   On: 06/26/2023 15:37   DG C-Arm 1-60 Min-No Report Result Date: 06/26/2023 Fluoroscopy was utilized by the requesting physician.  No radiographic interpretation.   DG C-Arm 1-60 Min-No Report Result Date: 06/26/2023 Fluoroscopy was utilized by the requesting physician.  No radiographic interpretation.    Disposition: Discharge disposition: 01-Home or Self Care       Discharge Instructions     Call MD / Call 911   Complete by: As directed    If you experience chest pain or shortness of breath, CALL 911 and be transported to the hospital emergency room.  If you develope a fever above 101 F, pus (white  drainage) or increased drainage or redness at the wound, or calf pain, call your surgeon's office.   Change dressing   Complete by: As directed    You have an adhesive waterproof bandage over the incision. Leave this in place until your first follow-up appointment. Once you remove this you will not need to place another bandage.   Constipation Prevention   Complete by: As directed    Drink plenty of fluids.  Prune juice may be helpful.  You may use a stool softener, such as Colace (over the counter) 100 mg twice a day.  Use MiraLax (over the counter) for constipation as needed.   Diet - low sodium heart healthy   Complete by: As directed    Do not sit on low chairs, stoools or toilet seats, as it may be difficult to get up from low surfaces   Complete by: As directed    Driving restrictions   Complete by: As directed    No driving for two weeks   Post-operative opioid taper instructions:   Complete by: As directed    POST-OPERATIVE OPIOID TAPER INSTRUCTIONS: It is important to wean off of your opioid medication as soon as possible. If you do not need pain medication after your surgery it is ok to stop day one. Opioids include: Codeine, Hydrocodone(Norco, Vicodin), Oxycodone(Percocet, oxycontin) and hydromorphone amongst  others.  Long term and even short term use of opiods can cause: Increased pain response Dependence Constipation Depression Respiratory depression And more.  Withdrawal symptoms can include Flu like symptoms Nausea, vomiting And more Techniques to manage these symptoms Hydrate well Eat regular healthy meals Stay active Use relaxation techniques(deep breathing, meditating, yoga) Do Not substitute Alcohol to help with tapering If you have been on opioids for less than two weeks and do not have pain than it is ok to stop all together.  Plan to wean off of opioids This plan should start within one week post op of your joint replacement. Maintain the same interval or time between taking each dose and first decrease the dose.  Cut the total daily intake of opioids by one tablet each day Next start to increase the time between doses. The last dose that should be eliminated is the evening dose.      TED hose   Complete by: As directed    Use stockings (TED hose) for three weeks on both leg(s).  You may remove them at night for sleeping.   Weight bearing as tolerated   Complete by: As directed         Follow-up Information     Aluisio, Homero Fellers, MD. Schedule an appointment as soon as possible for a visit in 2 week(s).   Specialty: Orthopedic Surgery Contact information: [address] [place] [address] [phone]                  Signed: Arther Abbott 07/09/2023, 12:57 PM

## 2023-08-06 ENCOUNTER — Encounter: Payer: Self-pay | Admitting: Family Medicine

## 2023-08-06 ENCOUNTER — Ambulatory Visit (INDEPENDENT_AMBULATORY_CARE_PROVIDER_SITE_OTHER): Payer: 59 | Admitting: Family Medicine

## 2023-08-06 VITALS — BP 134/74 | HR 83 | Ht 65.0 in | Wt 236.2 lb

## 2023-08-06 DIAGNOSIS — R1030 Lower abdominal pain, unspecified: Secondary | ICD-10-CM

## 2023-08-06 DIAGNOSIS — R252 Cramp and spasm: Secondary | ICD-10-CM

## 2023-08-06 DIAGNOSIS — E119 Type 2 diabetes mellitus without complications: Secondary | ICD-10-CM | POA: Diagnosis not present

## 2023-08-06 DIAGNOSIS — E669 Obesity, unspecified: Secondary | ICD-10-CM | POA: Diagnosis not present

## 2023-08-06 DIAGNOSIS — E1169 Type 2 diabetes mellitus with other specified complication: Secondary | ICD-10-CM

## 2023-08-06 DIAGNOSIS — N3000 Acute cystitis without hematuria: Secondary | ICD-10-CM

## 2023-08-06 DIAGNOSIS — G629 Polyneuropathy, unspecified: Secondary | ICD-10-CM

## 2023-08-06 DIAGNOSIS — E785 Hyperlipidemia, unspecified: Secondary | ICD-10-CM

## 2023-08-06 LAB — POCT URINALYSIS DIP (MANUAL ENTRY)
Bilirubin, UA: NEGATIVE
Blood, UA: NEGATIVE
Glucose, UA: NEGATIVE mg/dL
Ketones, POC UA: NEGATIVE mg/dL
Nitrite, UA: NEGATIVE
Spec Grav, UA: >=1.03 — AB (ref 1.010–1.025)
Urobilinogen, UA: 0.2 U/dL
pH, UA: 5.5 (ref 5.0–8.0)

## 2023-08-06 LAB — POCT UA - MICROSCOPIC ONLY: RBC, Urine, Miroscopic: NONE SEEN (ref 0–2)

## 2023-08-06 MED ORDER — MOUNJARO 2.5 MG/0.5ML ~~LOC~~ SOAJ: 2.5000 mg | SUBCUTANEOUS | 0 refills | Status: DC

## 2023-08-06 MED ORDER — GABAPENTIN 100 MG PO CAPS: 100.0000 mg | ORAL_CAPSULE | Freq: Three times a day (TID) | ORAL | 0 refills | Status: DC

## 2023-08-06 MED ORDER — NITROFURANTOIN MONOHYD MACRO 100 MG PO CAPS: 100.0000 mg | ORAL_CAPSULE | Freq: Two times a day (BID) | ORAL | 0 refills | Status: AC

## 2023-08-06 NOTE — Progress Notes (Signed)
    SUBJECTIVE:   CHIEF COMPLAINT / HPI:   T2DM and obesity -Current medication regimen: Metformin 1000 mg nightly, Mounjaro 2.5 mg weekly started 1/27.  However has only been able to take 1 dose of this. Has felt a little weird since then - a bit of nausea and lower abdominal pain but nothing serious.  Endorses polyuria but no dysuria, hematuria, vaginal discharge, vaginal bleeding, fevers -Foot exam: done today  Lab Results  Component Value Date   HGBA1C 7.3 (H) 06/17/2023   HGBA1C 7.3 (A) 04/16/2023   HGBA1C 7.7 (H) 02/06/2023   HLD Has had statin intolerance to pravastatin and rosuvastatin - myopathy/cramps Has been using fish oil  R leg pain and foot numbness Ongoing for several months/years Has pain in her right shin/leg with walking Briefly tried gabapentin in the past but does not remember whether this helped or not Had ABIs done in 2018 due to concern for claudication which were normal  PERTINENT  PMH / PSH: Diabetes, obesity, neuropathy, osteoarthritis, document history of claudication  OBJECTIVE:   BP 134/74   Pulse 83   Ht 5\' 5"  (1.651 m)   Wt 236 lb 3.2 oz (107.1 kg)   SpO2 98%   BMI 39.31 kg/m   General: NAD, pleasant, able to participate in exam Cardiac: RRR, no murmurs auscultated Respiratory: CTAB, normal WOB Abdomen: Soft, nondistended, very slightly tender in suprapubic area but otherwise nontender Extremities: Right lower extremity sore to touch around the shin.  No significant edema or asymmetry  Feet: Right foot with decreased sensation throughout.  2+ DP pulses.  Cap refill less than 2 seconds.  No obvious skin breakdown, wounds, ulcers.  Left foot with unremarkable skin, normal sensation.  ASSESSMENT/PLAN:   Assessment & Plan Diabetes mellitus without complication (HCC) Was unable to fully trial Mounjaro.  Continue metformin and Mounjaro 2.5 mg, follow-up in 4 weeks for titration of Mounjaro Hyperlipidemia associated with type 2 diabetes  mellitus (HCC) Had myopathy associated with lipitor, pravastatin, and rosuvastatin  Recheck lipid panel today, consider referral to lipid clinic if elevated Obesity (BMI 30-39.9) Mounjaro as above Lower abdominal pain Unclear etiology.  Associated with mild nausea.  Suspect this is likely associated with initiation of Mounjaro.  However, UA showed trace leukocytes and many bacteria on microscopy.  Given this finding with suprapubic pain and history of urinary frequency, will treat with a 5-day course of Macrobid. Neuropathy Right foot with decreased sensation on exam.  Appears to have good perfusion with normal pulses.  Suspect this is likely in the setting of her diabetes causing neuropathy.  Rx gabapentin 100 mg 3 times daily (advised patient to start with 100 mg at night for a couple of days then work her way up to 3 times daily) for diabetic neuropathy.   However, given that this is associated with some leg soreness which sounds somewhat like claudication, ordered ABIs to evaluate for any vascular disease in her lower extremities.  I did note that she had normal ABIs done in 2018.   Vonna Drafts, MD [facility]

## 2023-08-06 NOTE — Assessment & Plan Note (Addendum)
 Was unable to fully trial Mounjaro.  Continue metformin and Mounjaro 2.5 mg, follow-up in 4 weeks for titration of Mounjaro

## 2023-08-06 NOTE — Patient Instructions (Addendum)
 I will let you know if results from today are abnormal  ABI scheduled for next Wednesday  Please follow up in 4 weeks after completing 4 doses of mounjaro

## 2023-08-06 NOTE — Assessment & Plan Note (Signed)
 Right foot with decreased sensation on exam.  Appears to have good perfusion with normal pulses.  Suspect this is likely in the setting of her diabetes causing neuropathy.  Rx gabapentin 100 mg 3 times daily (advised patient to start with 100 mg at night for a couple of days then work her way up to 3 times daily) for diabetic neuropathy.   However, given that this is associated with some leg soreness which sounds somewhat like claudication, ordered ABIs to evaluate for any vascular disease in her lower extremities.  I did note that she had normal ABIs done in 2018.

## 2023-08-06 NOTE — Assessment & Plan Note (Signed)
 Had myopathy associated with lipitor, pravastatin, and rosuvastatin  Recheck lipid panel today, consider referral to lipid clinic if elevated

## 2023-08-07 LAB — LIPID PANEL
Chol/HDL Ratio: 2.9 {ratio} (ref 0.0–4.4)
Cholesterol, Total: 207 mg/dL — ABNORMAL HIGH (ref 100–199)
HDL: 72 mg/dL (ref >39)
LDL Chol Calc (NIH): 115 mg/dL — ABNORMAL HIGH (ref 0–99)
Triglycerides: 116 mg/dL (ref 0–149)
VLDL Cholesterol Cal: 20 mg/dL (ref 5–40)

## 2023-08-13 ENCOUNTER — Encounter: Payer: Self-pay | Admitting: Family Medicine

## 2023-08-14 ENCOUNTER — Ambulatory Visit (HOSPITAL_COMMUNITY): Payer: 59

## 2023-08-14 ENCOUNTER — Ambulatory Visit (HOSPITAL_COMMUNITY)

## 2023-08-21 ENCOUNTER — Ambulatory Visit (HOSPITAL_COMMUNITY)
Admission: RE | Admit: 2023-08-21 | Discharge: 2023-08-21 | Disposition: A | Discharge status: Home / Self Care | Source: Ambulatory Visit | Attending: Family Medicine | Admitting: Family Medicine

## 2023-08-21 DIAGNOSIS — G629 Polyneuropathy, unspecified: Secondary | ICD-10-CM | POA: Insufficient documentation

## 2023-08-21 DIAGNOSIS — R252 Cramp and spasm: Secondary | ICD-10-CM | POA: Insufficient documentation

## 2023-08-21 LAB — VAS US ABI WITH/WO TBI
Left ABI: 1.13
Right ABI: 1.04

## 2023-08-22 ENCOUNTER — Telehealth: Payer: Self-pay | Admitting: Family Medicine

## 2023-08-22 ENCOUNTER — Encounter: Payer: Self-pay | Admitting: Family Medicine

## 2023-08-22 NOTE — Telephone Encounter (Signed)
 Patient is requesting refill on Mounjaro. She is needing it called in as soon as possible for her to take her shot Sunday.   Please Advise.   Thanks!

## 2023-08-23 ENCOUNTER — Encounter: Payer: Self-pay | Admitting: Family Medicine

## 2023-08-23 DIAGNOSIS — E669 Obesity, unspecified: Secondary | ICD-10-CM

## 2023-08-23 MED ORDER — MOUNJARO 2.5 MG/0.5ML ~~LOC~~ SOAJ: 2.5000 mg | SUBCUTANEOUS | 0 refills | Status: DC

## 2023-08-23 NOTE — Telephone Encounter (Signed)
Routed message to PCP. Jacorie Ernsberger, CMA  

## 2023-09-19 ENCOUNTER — Other Ambulatory Visit: Payer: Self-pay | Admitting: Family Medicine

## 2023-09-19 DIAGNOSIS — R140 Abdominal distension (gaseous): Secondary | ICD-10-CM

## 2023-09-20 ENCOUNTER — Telehealth: Payer: Self-pay

## 2023-09-20 ENCOUNTER — Ambulatory Visit (INDEPENDENT_AMBULATORY_CARE_PROVIDER_SITE_OTHER): Admitting: Student

## 2023-09-20 ENCOUNTER — Encounter: Payer: Self-pay | Admitting: Student

## 2023-09-20 VITALS — BP 137/70 | HR 63 | Temp 97.7°F | Wt 227.2 lb

## 2023-09-20 DIAGNOSIS — N393 Stress incontinence (female) (male): Secondary | ICD-10-CM | POA: Diagnosis not present

## 2023-09-20 DIAGNOSIS — E119 Type 2 diabetes mellitus without complications: Secondary | ICD-10-CM

## 2023-09-20 DIAGNOSIS — R300 Dysuria: Secondary | ICD-10-CM

## 2023-09-20 DIAGNOSIS — M25511 Pain in right shoulder: Secondary | ICD-10-CM

## 2023-09-20 DIAGNOSIS — M75121 Complete rotator cuff tear or rupture of right shoulder, not specified as traumatic: Secondary | ICD-10-CM

## 2023-09-20 DIAGNOSIS — E669 Obesity, unspecified: Secondary | ICD-10-CM

## 2023-09-20 LAB — POCT URINALYSIS DIP (MANUAL ENTRY)
Bilirubin, UA: NEGATIVE
Blood, UA: NEGATIVE
Glucose, UA: NEGATIVE mg/dL
Ketones, POC UA: NEGATIVE mg/dL
Nitrite, UA: NEGATIVE
Protein Ur, POC: NEGATIVE mg/dL
Spec Grav, UA: 1.02 (ref 1.010–1.025)
Urobilinogen, UA: 0.2 U/dL
pH, UA: 6 (ref 5.0–8.0)

## 2023-09-20 LAB — POCT UA - MICROSCOPIC ONLY: RBC, Urine, Miroscopic: NONE SEEN (ref 0–2)

## 2023-09-20 LAB — POCT GLYCOSYLATED HEMOGLOBIN (HGB A1C): HbA1c, POC (controlled diabetic range): 6.1 % (ref 0.0–7.0)

## 2023-09-20 MED ORDER — LIDOCAINE 5 % EX PTCH: 1.0000 | MEDICATED_PATCH | Freq: Every day | CUTANEOUS | 1 refills | Status: DC | PRN

## 2023-09-20 MED ORDER — MOUNJARO 5 MG/0.5ML ~~LOC~~ SOAJ: 5.0000 mg | SUBCUTANEOUS | 3 refills | Status: DC

## 2023-09-20 MED ORDER — MOUNJARO 2.5 MG/0.5ML ~~LOC~~ SOAJ
2.5000 mg | SUBCUTANEOUS | 5 refills | Status: DC
Start: 2023-09-20 — End: 2023-10-10

## 2023-09-20 NOTE — Progress Notes (Signed)
    SUBJECTIVE:   CHIEF COMPLAINT / HPI:   63 year old female with a history of diabetes, presents for a check-up and to discuss her current medication regimen. She is currently on Mounjaro and metformin for diabetes management and has noticed some improvement in her symptoms. However, she expresses concern about the need for ongoing prescriptions and the need for regular A1c checks to monitor her diabetes control.  In addition to her diabetes, the patient has been experiencing urinary symptoms. She reports frequent urination and occasional incontinence, particularly when she coughs or laughs. She has been managing this with the use of pads. She also reports a history of UTIs and recent treatment with antibiotics. She expresses concern about the recurrence of these symptoms and is seeking further evaluation and management.   PERTINENT  PMH / PSH: Reviewed  OBJECTIVE:   BP 137/70   Pulse 63   Temp 97.7 F (36.5 C)   Wt 227 lb 3.2 oz (103.1 kg)   SpO2 99%   BMI 37.81 kg/m    Physical Exam General: Morbidly obese, well appearing, NAD Cardiovascular: RRR, No Murmurs, Normal S2/S2 Respiratory: CTAB, No wheezing or Rales Abdomen: No distension or tenderness  ASSESSMENT/PLAN:   Diabetes mellitus without complication (HCC) Well-controlled with A1c of 6.1% today.  No hypoglycemia or hyperglycemic episodes. - Order A1c test. - Refilled the 2.5mg  weekly.  Patient's preference is to maintain current dose.  Stress incontinence of urine Stress urinary incontinence likely due to weakened pelvic muscles from previous vaginal deliveries. Reports itchiness without discharge or burning. - Order urinalysis and urine culture to evaluate for UTI. - Recommend Kegel exercises to strengthen pelvic muscles. - Advise on scheduled voiding to manage urgency. -Consider referral for Pelvic PT   Jerre Simon, MD [facility]

## 2023-09-20 NOTE — Assessment & Plan Note (Addendum)
 Well-controlled with A1c of 6.1% today.  No hypoglycemia or hyperglycemic episodes. - Order A1c test. - Refilled the 2.5mg  weekly.  Patient's preference is to maintain current dose.

## 2023-09-20 NOTE — Patient Instructions (Addendum)
 Pleasure to meet you today.  Today we obtained your A1c which was 6.1%.  I have increased your Mounjaro to 5 mg weekly.  Your urinary incontinence I suspect is most likely due to press incontinence mostly from weakening of your pelvic muscles likely from child birth.  Recommend Kegel exercise which is listed below.  And also frequent emptying of your bladder even when you do not feel the need to void   How to do Kegel exercises To get started:  Find the right muscles. There are a few ways to find your pelvic floor muscles. Squeeze the muscles you use to stop passing gas. Or squeeze the muscles you use to stop urination in midstream. You should notice a slight pulling feeling in your rectum and vagina. You also could place your finger into your vagina and squeeze as if trying to hold in urine. A feeling of tightness around your finger means you're squeezing the pelvic floor muscles. Once you've found the right muscles, you can do Kegel exercises lying down, seated or standing. You might find it easiest to do them lying down at first.  Perfect your technique. To do Kegels, imagine you're sitting on a marble. Tighten your pelvic muscles as if you're lifting the marble upward, toward your head. Try it for three seconds at a time. Then relax for a count of three.  Keep your focus. For best results, focus on tightening only your pelvic floor muscles. Try not to flex the muscles in your stomach area, thighs or buttocks. And don't hold your breath. Instead, breathe freely during the exercises.  Repeat three times a day. In general, aim to do at least three sets a day. Work up to doing 10 to [place] in [employer]. You could do one set lying down, one set seated and one set standing. You also can ask your healthcare professional to make a Kegel exercise plan that's tailored just for you.

## 2023-09-20 NOTE — Assessment & Plan Note (Signed)
 Stress urinary incontinence likely due to weakened pelvic muscles from previous vaginal deliveries. Reports itchiness without discharge or burning. - Order urinalysis and urine culture to evaluate for UTI. - Recommend Kegel exercises to strengthen pelvic muscles. - Advise on scheduled voiding to manage urgency. -Consider referral for Pelvic PT

## 2023-09-20 NOTE — Telephone Encounter (Signed)
 Patient calls nurse line reporting "something dropped in my mychart."   She reports she received a notification, however she is unable to log in.   We dicussed her UA and A1c. Advised her urine culture will not be back until next week.   Patient advised to monitor her symptoms of urinary frequency over the weekend. Advised if symptoms worsen to give [place] a call on Monday.   Precautions discussed.

## 2023-09-24 ENCOUNTER — Telehealth: Payer: Self-pay | Admitting: Student

## 2023-09-24 MED ORDER — FLUCONAZOLE 150 MG PO TABS: 150.0000 mg | ORAL_TABLET | Freq: Once | ORAL | 0 refills | Status: AC

## 2023-09-24 NOTE — Telephone Encounter (Signed)
 Called patient to inform her her UA was generally unremarkable other than small leuks.  Patient still reports some vaginal itchiness.  Given the symptoms patient was agreeable to starting treatment. Will do one dose of diflucan. If no improvement will likely need a pelvic exam to swab for STDs and yeast infection.

## 2023-10-10 ENCOUNTER — Other Ambulatory Visit: Payer: Self-pay | Admitting: Family Medicine

## 2023-10-10 ENCOUNTER — Telehealth: Payer: Self-pay

## 2023-10-10 DIAGNOSIS — E669 Obesity, unspecified: Secondary | ICD-10-CM

## 2023-10-10 DIAGNOSIS — E119 Type 2 diabetes mellitus without complications: Secondary | ICD-10-CM

## 2023-10-10 MED ORDER — MOUNJARO 5 MG/0.5ML ~~LOC~~ SOAJ: 5.0000 mg | SUBCUTANEOUS | 3 refills | Status: DC

## 2023-10-10 NOTE — Telephone Encounter (Signed)
 Patient calls nurse line in regards to Mounjaro.   She reports she has been on 2.5mg  for ~ 3 months and she reports she is ready for the next dose increase.   She denies any adverse reactions. No nausea, vomiting, constipation, diarrhea or abdominal pain.   She requests this next dose be sent to [facility] on [address].   Will forward to PCP.

## 2023-10-11 NOTE — Telephone Encounter (Signed)
 Patient contacted and updated on medication increase.

## 2023-11-08 ENCOUNTER — Other Ambulatory Visit: Payer: Self-pay | Admitting: Family Medicine

## 2023-11-16 ENCOUNTER — Other Ambulatory Visit: Payer: Self-pay | Admitting: Student

## 2023-11-16 DIAGNOSIS — M25511 Pain in right shoulder: Secondary | ICD-10-CM

## 2023-11-16 DIAGNOSIS — M75121 Complete rotator cuff tear or rupture of right shoulder, not specified as traumatic: Secondary | ICD-10-CM

## 2023-12-05 ENCOUNTER — Other Ambulatory Visit: Payer: 59

## 2023-12-18 ENCOUNTER — Other Ambulatory Visit: Payer: Self-pay | Admitting: Family Medicine

## 2023-12-18 DIAGNOSIS — E119 Type 2 diabetes mellitus without complications: Secondary | ICD-10-CM

## 2023-12-30 ENCOUNTER — Inpatient Hospital Stay: Admission: RE | Admit: 2023-12-30 | Source: Ambulatory Visit

## 2024-01-13 ENCOUNTER — Other Ambulatory Visit: Payer: Self-pay

## 2024-01-13 ENCOUNTER — Encounter (HOSPITAL_COMMUNITY): Payer: Self-pay

## 2024-01-13 ENCOUNTER — Emergency Department (HOSPITAL_COMMUNITY)
Admission: EM | Admit: 2024-01-13 | Discharge: 2024-01-14 | Disposition: A | Discharge status: Home / Self Care | Attending: Emergency Medicine | Admitting: Emergency Medicine

## 2024-01-13 DIAGNOSIS — E876 Hypokalemia: Secondary | ICD-10-CM | POA: Diagnosis not present

## 2024-01-13 DIAGNOSIS — N3000 Acute cystitis without hematuria: Secondary | ICD-10-CM | POA: Insufficient documentation

## 2024-01-13 DIAGNOSIS — R519 Headache, unspecified: Secondary | ICD-10-CM | POA: Insufficient documentation

## 2024-01-13 DIAGNOSIS — R300 Dysuria: Secondary | ICD-10-CM | POA: Diagnosis present

## 2024-01-13 LAB — URINALYSIS, ROUTINE W REFLEX MICROSCOPIC
Bilirubin Urine: NEGATIVE
Glucose, UA: NEGATIVE mg/dL
Ketones, ur: NEGATIVE mg/dL
Nitrite: NEGATIVE
Protein, ur: NEGATIVE mg/dL
Specific Gravity, Urine: 1.016 (ref 1.005–1.030)
WBC, UA: >50 WBC/hpf (ref 0–5)
pH: 5 (ref 5.0–8.0)

## 2024-01-13 NOTE — ED Triage Notes (Signed)
 Patient c/o headache x 5 days, tylenol  did not help. Patient also c/o peeing a lot and an odor with it.

## 2024-01-14 ENCOUNTER — Emergency Department (HOSPITAL_COMMUNITY)

## 2024-01-14 DIAGNOSIS — N3000 Acute cystitis without hematuria: Secondary | ICD-10-CM | POA: Diagnosis not present

## 2024-01-14 LAB — CBC WITH DIFFERENTIAL/PLATELET
Abs Immature Granulocytes: 0.02 K/uL (ref 0.00–0.07)
Basophils Absolute: 0 K/uL (ref 0.0–0.1)
Basophils Relative: 0 %
Eosinophils Absolute: 0.1 K/uL (ref 0.0–0.5)
Eosinophils Relative: 1 %
HCT: 40.6 % (ref 36.0–46.0)
Hemoglobin: 12.3 g/dL (ref 12.0–15.0)
Immature Granulocytes: 0 %
Lymphocytes Relative: 32 %
Lymphs Abs: 2.2 K/uL (ref 0.7–4.0)
MCH: 25.2 pg — ABNORMAL LOW (ref 26.0–34.0)
MCHC: 30.3 g/dL (ref 30.0–36.0)
MCV: 83 fL (ref 80.0–100.0)
Monocytes Absolute: 0.6 K/uL (ref 0.1–1.0)
Monocytes Relative: 9 %
Neutro Abs: 3.9 K/uL (ref 1.7–7.7)
Neutrophils Relative %: 58 %
Platelets: 261 K/uL (ref 150–400)
RBC: 4.89 MIL/uL (ref 3.87–5.11)
RDW: 16.5 % — ABNORMAL HIGH (ref 11.5–15.5)
WBC: 6.9 K/uL (ref 4.0–10.5)
nRBC: 0 % (ref 0.0–0.2)

## 2024-01-14 LAB — COMPREHENSIVE METABOLIC PANEL WITH GFR
ALT: 18 U/L (ref 0–44)
AST: 20 U/L (ref 15–41)
Albumin: 3.4 g/dL — ABNORMAL LOW (ref 3.5–5.0)
Alkaline Phosphatase: 108 U/L (ref 38–126)
Anion gap: 13 (ref 5–15)
BUN: 15 mg/dL (ref 8–23)
CO2: 22 mmol/L (ref 22–32)
Calcium: 8.8 mg/dL — ABNORMAL LOW (ref 8.9–10.3)
Chloride: 103 mmol/L (ref 98–111)
Creatinine, Ser: 0.74 mg/dL (ref 0.44–1.00)
GFR, Estimated: >60 mL/min (ref >60)
Glucose, Bld: 163 mg/dL — ABNORMAL HIGH (ref 70–99)
Potassium: 3.3 mmol/L — ABNORMAL LOW (ref 3.5–5.1)
Sodium: 138 mmol/L (ref 135–145)
Total Bilirubin: 0.4 mg/dL (ref 0.0–1.2)
Total Protein: 6.7 g/dL (ref 6.5–8.1)

## 2024-01-14 LAB — LIPASE, BLOOD: Lipase: 33 U/L (ref 11–51)

## 2024-01-14 MED ORDER — POTASSIUM CHLORIDE CRYS ER 20 MEQ PO TBCR
40.0000 meq | EXTENDED_RELEASE_TABLET | Freq: Once | ORAL | Status: DC
Start: 2024-01-14 — End: 2024-01-14

## 2024-01-14 MED ORDER — KETOROLAC TROMETHAMINE 15 MG/ML IJ SOLN: 15.0000 mg | Freq: Once | INTRAMUSCULAR | Status: DC

## 2024-01-14 MED ORDER — PHENAZOPYRIDINE HCL 95 MG PO TABS: 95.0000 mg | ORAL_TABLET | Freq: Three times a day (TID) | ORAL | 0 refills | Status: DC | PRN

## 2024-01-14 MED ORDER — PROCHLORPERAZINE EDISYLATE 10 MG/2ML IJ SOLN
10.0000 mg | Freq: Once | INTRAMUSCULAR | Status: AC
  Administered 2024-01-14: 10 mg via INTRAVENOUS
  Filled 2024-01-14: qty 2

## 2024-01-14 MED ORDER — CEFADROXIL 500 MG PO CAPS
500.0000 mg | ORAL_CAPSULE | Freq: Two times a day (BID) | ORAL | 0 refills | Status: AC
Start: 2024-01-14 — End: 2024-01-19

## 2024-01-14 MED ORDER — DIPHENHYDRAMINE HCL 50 MG/ML IJ SOLN
12.5000 mg | Freq: Once | INTRAMUSCULAR | Status: AC
  Administered 2024-01-14: 12.5 mg via INTRAVENOUS
  Filled 2024-01-14: qty 1

## 2024-01-14 MED ORDER — DEXAMETHASONE SODIUM PHOSPHATE 10 MG/ML IJ SOLN: 10.0000 mg | Freq: Once | INTRAMUSCULAR | Status: DC

## 2024-01-14 NOTE — ED Provider Notes (Signed)
 [place] [facility]MENT AT [facility] Provider Note   CSN: [id number] Arrival date & time: 01/13/24  1916    Patient presents with: Dysuria and Migraine   Alyssa Montgomery is a 63 y.o. female HA for 5 days, radiates from back of head to the temples. Tried tylenol , oxycodone  wo relief. Though was due to BP at home 180/75. No hx of HTN. Under more stress. Some nausea. No lightheadedness, neck stiffness, dizziness, blurred vision, speech difficulty, numbness or weakness.  Has been able to ambulate without difficulty.  Also with urinary frequency, urgency and malodorous urine.  No fever, flank pain. Wipes from back to front.  Was on Mounjaro , stopped and recently bent back to metformin  2 weeks ago.  Has some chronic neuropathy of bilateral feet  R>L from DM per patient-chronic right foot pain unchanged   Prosthetic eye      HPI     Prior to Admission medications   Medication Sig Start Date End Date Taking? Authorizing Provider  cefadroxil  (DURICEF) 500 MG capsule Take 1 capsule (500 mg total) by mouth 2 (two) times daily for 5 days. 01/14/24 01/19/24 Yes Dametria Tuzzolino A, PA-C  phenazopyridine  (PYRIDIUM ) 95 MG tablet Take 1 tablet (95 mg total) by mouth 3 (three) times daily as needed for pain. 01/14/24  Yes Eutha Cude A, PA-C  albuterol  (VENTOLIN  HFA) 108 (90 Base) MCG/ACT inhaler INHALE 2 PUFFS INTO THE LUNGS EVERY 6 HOURS AS NEEDED FOR WHEEZING/SHORTNESS OF BREATH 01/22/23   Tharon Lung, MD  benzonatate  (TESSALON  PERLES) 100 MG capsule Take 1 capsule (100 mg total) by mouth every 6 (six) hours as needed for cough. 06/28/23 06/27/24  Kristian Stabs, PA  budesonide -formoterol  (SYMBICORT ) 160-4.5 MCG/ACT inhaler Inhale 2 puffs into the lungs in the morning and at bedtime. 04/23/23   Raspet, Erin K, PA-C  Cholecalciferol (VITAMIN D3 PO) Take 1 tablet by mouth daily.    [provider]  cyclobenzaprine  (FLEXERIL ) 10 MG tablet Take 1 tablet (10 mg total) by  mouth 3 (three) times daily as needed for muscle spasms. 06/28/23   Kristian Stabs, PA  ECHINACEA PO Take 1 capsule by mouth daily.    [provider]  ELDERBERRY PO Take 1 capsule by mouth daily.    [provider]  famotidine  (PEPCID ) 20 MG tablet TAKE 1 TABLET BY MOUTH EVERY DAY 09/19/23   Elicia Hamlet, MD  fluticasone  (FLONASE ) 50 MCG/ACT nasal spray INSTILL 1 SPRAY INTO BOTH NOSTRILS DAILY 07/10/23   Romelle Booty, MD  gabapentin  (NEURONTIN ) 100 MG capsule Take 1 capsule (100 mg total) by mouth 3 (three) times daily. 08/06/23   Romelle Booty, MD  lansoprazole  (PREVACID ) 30 MG capsule TAKE 1 CAPSULE BY MOUTH EVERY DAY 11/08/23   Romelle Booty, MD  latanoprost (XALATAN) 0.005 % ophthalmic solution Place 1 drop into the left eye at bedtime. 04/19/23   [provider]  lidocaine  (LIDODERM ) 5 % PLACE 1 PATCH ONTO THE SKIN DAILY AS NEEDED.PAIN REMOVE & DISCARD WITHIN 12 HOURS OR AS DIRECTED 11/18/23   Romelle Booty, MD  Menthol -Camphor (TIGER BALM ARTHRITIS RUB EX) Apply topically.    [provider]  metFORMIN  (GLUCOPHAGE -XR) 500 MG 24 hr tablet TAKE 2 TABLETS (1,000 MG TOTAL) BY MOUTH EVERY EVENING. 12/19/23   Romelle Booty, MD  MORINGA OLEIFERA PO Take 1 tablet by mouth daily.    [provider]  multivitamin-lutein (OCUVITE-LUTEIN) CAPS capsule Take 1 capsule by mouth daily.    [provider]  oxyCODONE  (ROXICODONE )  5 MG immediate release tablet Take 1-2 tablets (5-10 mg total) by mouth every 6 (six) hours as needed for severe pain (pain score 7-10). 06/28/23   Kristian Stabs, PA  promethazine  (PHENERGAN ) 12.5 MG tablet Take 1 tablet (12.5 mg total) by mouth every 6 (six) hours as needed for nausea, vomiting or refractory nausea / vomiting. 05/12/22   Kay Kemps, MD  promethazine  (PHENERGAN ) 12.5 MG tablet Take 1 tablet (12.5 mg total) by mouth every 6 (six) hours as needed for nausea or vomiting. 06/28/23   Kristian Stabs, PA   promethazine -dextromethorphan  (PROMETHAZINE -DM) 6.25-15 MG/5ML syrup Take 5 mLs by mouth 2 (two) times daily as needed for cough. Patient not taking: Reported on 06/06/2023 04/23/23   Raspet, Erin K, PA-C  tirzepatide  (MOUNJARO ) 5 MG/0.5ML Pen Inject 5 mg into the skin once a week. 10/10/23   Romelle Booty, MD  traMADol  (ULTRAM ) 50 MG tablet Take 1-2 tablets (50-100 mg total) by mouth every 6 (six) hours as needed for moderate pain (pain score 4-6). 06/28/23   Kristian Stabs, PA  zinc  gluconate 50 MG tablet Take 50 mg by mouth daily.    [provider]    Allergies: Zanaflex  [tizanidine  hcl], Penicillins, and Sulfonamide derivatives    Review of Systems  Constitutional: Negative.   HENT: Negative.    Respiratory: Negative.    Cardiovascular: Negative.   Gastrointestinal:  Positive for nausea. Negative for abdominal distention, abdominal pain, anal bleeding, diarrhea and vomiting.  Genitourinary:  Positive for dyspareunia (chronic per patient), dysuria, frequency and urgency. Negative for decreased urine volume, difficulty urinating, enuresis, flank pain, genital sores, hematuria, menstrual problem, pelvic pain, vaginal discharge and vaginal pain.  Musculoskeletal: Negative.   Skin: Negative.   Neurological:  Positive for headaches. Negative for dizziness, tremors, seizures, syncope, facial asymmetry, speech difficulty, weakness, light-headedness and numbness.  All other systems reviewed and are negative.   Updated Vital Signs BP (!) 146/88   Pulse 70   Temp 98.3 F (36.8 C)   Resp 18   SpO2 100%   Physical Exam Physical Exam  Constitutional: Pt is oriented to person, place, and time. Pt appears well-developed and well-nourished. No distress.  HENT:  Head: Normocephalic and atraumatic.  Mouth/Throat: Oropharynx is clear and moist.  Neck: Normal range of motion. Neck supple.  Full active and passive ROM without pain No midline or paraspinal tenderness No nuchal  rigidity or meningeal signs  Cardiovascular: Normal rate, regular rhythm and intact distal pulses.   Pulmonary/Chest: Effort normal and breath sounds normal. No respiratory distress. Pt has no wheezes. No rales.  Abdominal: Soft. Bowel sounds are normal. There is no tenderness. There is no rebound and no guarding.  Negative CVA tap bilaterally Musculoskeletal: Normal range of motion.  Neurological: Pt. is alert and oriented to person, place, and time. He has normal reflexes. No cranial nerve deficit.  Exhibits normal muscle tone. Coordination normal.  Mental Status:  Alert, oriented, thought content appropriate. Speech fluent without evidence of aphasia. Able to follow 2 step commands without difficulty.  Cranial Nerves:  Grossly intact, prosthetic eye Motor:  5/5 in upper and lower extremities bilaterally including strong and equal grip strength and dorsiflexion/plantar flexion Sensory: decreased sensation to right foot- chronic per patient Cerebellar: neg F2N Gait: normal gait and balance CV: distal pulses palpable throughout   Skin: Skin is warm and dry. No rash noted. Pt is not diaphoretic.  Psychiatric: Pt has a normal mood and affect. Behavior is normal. Judgment and thought content normal.  Nursing note and vitals reviewed.  (all labs ordered are listed, but only abnormal results are displayed) Labs Reviewed  URINALYSIS, ROUTINE W REFLEX MICROSCOPIC - Abnormal; Notable for the following components:      Result Value   APPearance HAZY (*)    Hgb urine dipstick SMALL (*)    Leukocytes,Ua LARGE (*)    Bacteria, UA FEW (*)    All other components within normal limits  CBC WITH DIFFERENTIAL/PLATELET - Abnormal; Notable for the following components:   MCH 25.2 (*)    RDW 16.5 (*)    All other components within normal limits  COMPREHENSIVE METABOLIC PANEL WITH GFR - Abnormal; Notable for the following components:   Potassium 3.3 (*)    Glucose, Bld 163 (*)    Calcium  8.8 (*)     Albumin  3.4 (*)    All other components within normal limits  LIPASE, BLOOD    EKG: None  Radiology: CT Head Wo Contrast Result Date: 01/14/2024 EXAM: CT HEAD WITHOUT 01/14/2024 04:40:56 AM TECHNIQUE: CT of the head was performed without the administration of intravenous contrast. Automated exposure control, iterative reconstruction, and/or weight based adjustment of the mA/kV was utilized to reduce the radiation dose to as low as reasonably achievable. COMPARISON: CT head without contrast 12/05/2020. CLINICAL HISTORY: Headache, new onset (Age >= 51y). Patient c/o headache x 5 days, tylenol  did not help. FINDINGS: BRAIN AND VENTRICLES: No acute intracranial hemorrhage. No mass effect or midline shift. No extra-axial fluid collection. Gray-white differentiation is maintained. No hydrocephalus. 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 ORBITS: No acute abnormality. SINUSES AND MASTOIDS: No acute abnormality. SOFT TISSUES AND SKULL: No acute skull fracture. No acute soft tissue abnormality. IMPRESSION: 1. No acute intracranial abnormality. Electronically signed by: Lonni Necessary MD 01/14/2024 04:55 AM EDT RP Workstation: [id number]     Procedures   Medications Ordered in the ED  ketorolac  (TORADOL ) 15 MG/ML injection 15 mg (15 mg Intravenous Patient Refused/Not Given 01/14/24 0516)  dexamethasone  (DECADRON ) injection 10 mg (10 mg Intravenous Patient Refused/Not Given 01/14/24 0515)  potassium chloride  SA (KLOR-CON  M) CR tablet 40 mEq (has no administration in time range)  prochlorperazine  (COMPAZINE ) injection 10 mg (10 mg Intravenous Given 01/14/24 0421)  diphenhydrAMINE  (BENADRYL ) injection 12.5 mg (12.5 mg Intravenous Given 01/14/24 0421)    63 here for evaluation of headache and dysuria.  Headache over the last few days.  No sudden onset thunderclap headache.  She has a nonfocal neuroexam without deficits.  No neck stiffness neck rigidity, fever.  No sensitivity to light.  No new numbness,  weakness, slurred speech.  OTC meds without relief.  Also with some dysuria, urgency and frequency as well as malodorous urine.  No abdominal pain.  No systemic symptoms.  States she has had something similar previously.  Will plan on labs, imaging and reassess  Labs and imaging personally viewed and interpreted:  CBC without leukocytosis Metabolic panel potassium 3.3,-will supplement Lipase 33 UA large leuks, few bacteria, greater than 50 WBC-given symptoms we will treat for UTI CT head without significant abnormality  Patient reassessed.  Given migraine cocktail.  On reassessment her symptoms have completely resolved.  She is ambulatory here without any difficulty.  Continues to have a nonfocal neuroexam without deficits.  Discussed single dose Decadron  for prophylaxis for returning headache however she declined.  Will have her follow-up outpatient, return for any worsening symptoms.  Low suspicion for CVA, dissection, IIH, acute glaucoma, trigeminal neuralgia, giant cell arteritis, meningitis, dural thrombosis, PRES, traumatic  injury, SAH as cause of her headache.  The patient has been appropriately medically screened and/or stabilized in the ED. I have low suspicion for any other emergent medical condition which would require further screening, evaluation or treatment in the ED or require inpatient management.  Patient is hemodynamically stable and in no acute distress.  Patient able to ambulate in department prior to ED.  Evaluation does not show acute pathology that would require ongoing or additional emergent interventions while in the emergency department or further inpatient treatment.  I have discussed the diagnosis with the patient and answered all questions.  Pain is been managed while in the emergency department and patient has no further complaints prior to discharge.  Patient is comfortable with plan discussed in room and is stable for discharge at this time.  I have discussed strict  return precautions for returning to the emergency department.  Patient was encouraged to follow-up with PCP/specialist refer to at discharge.                                   Medical Decision Making Amount and/or Complexity of Data Reviewed Independent Historian: spouse External Data Reviewed: labs, radiology and notes. Labs: ordered. Decision-making details documented in ED Course. Radiology: ordered and independent interpretation performed. Decision-making details documented in ED Course.  Risk OTC drugs. Prescription drug management. Parenteral controlled substances. Decision regarding hospitalization. Diagnosis or treatment significantly limited by social determinants of health.       Final diagnoses:  Acute cystitis without hematuria  Acute nonintractable headache, unspecified headache type  Hypokalemia    ED Discharge Orders          Ordered    cefadroxil  (DURICEF) 500 MG capsule  2 times daily        01/14/24 0600    phenazopyridine  (PYRIDIUM ) 95 MG tablet  3 times daily PRN        01/14/24 0601               Osby Sweetin A, PA-C 01/14/24 0647    Raford Lenis, MD 01/14/24 [phone]

## 2024-01-14 NOTE — Discharge Instructions (Addendum)
 It was a pleasure taking care of you here today  Make sure to keep a close eye on your symptoms.  I have written you for an antibiotic for your urinary tract infection as well as medication called Azo to help with your symptoms  Make sure to keep a close follow-up outpatient, return for any worsening symptoms.

## 2024-02-05 ENCOUNTER — Other Ambulatory Visit: Payer: Self-pay | Admitting: Family Medicine

## 2024-02-05 DIAGNOSIS — M75121 Complete rotator cuff tear or rupture of right shoulder, not specified as traumatic: Secondary | ICD-10-CM

## 2024-02-05 DIAGNOSIS — M25511 Pain in right shoulder: Secondary | ICD-10-CM

## 2024-02-13 ENCOUNTER — Other Ambulatory Visit: Payer: Self-pay | Admitting: Family Medicine

## 2024-02-13 DIAGNOSIS — J3089 Other allergic rhinitis: Secondary | ICD-10-CM

## 2024-03-13 ENCOUNTER — Other Ambulatory Visit: Payer: Self-pay | Admitting: Family Medicine

## 2024-03-17 LAB — LAB REPORT - SCANNED: Albumin/Creatinine Ratio, Urine, POC: <30

## 2024-03-26 ENCOUNTER — Telehealth: Payer: Self-pay

## 2024-03-26 ENCOUNTER — Ambulatory Visit

## 2024-03-26 VITALS — Ht 65.0 in | Wt 198.0 lb

## 2024-03-26 DIAGNOSIS — Z0000 Encounter for general adult medical examination without abnormal findings: Secondary | ICD-10-CM

## 2024-03-26 NOTE — Telephone Encounter (Signed)
 Patient stated during her AWV that she is in a lot of pain with her right knee.  She has Oxycodone , but can only take that during the weekend.  She is requesting either Naproxen  or Tramadol  that she can take on a daily basis for breakthrough pain.  Patient stated that her knee is swollen and she has been advised by Ortho to lose 30-40 pounds for knee surgery.  Patient also stated that she would like to speak with you regarding the [facility] injection to see if it can be authorized again or if you have any other suggestions.  Aden Sek N. Tomie, LPN [facility] Annual Wellness Team Direct Dial: [phone]

## 2024-03-26 NOTE — Patient Instructions (Signed)
 Alyssa Montgomery,  Thank you for taking the time for your Medicare Wellness Visit. I appreciate your continued commitment to your health goals. Please review the care plan we discussed, and feel free to reach out if I can assist you further.  Medicare recommends these wellness visits once per year to help you and your care team stay ahead of potential health issues. These visits are designed to focus on prevention, allowing your provider to concentrate on managing your acute and chronic conditions during your regular appointments.  Please note that Annual Wellness Visits do not include a physical exam. Some assessments may be limited, especially if the visit was conducted virtually. If needed, we may recommend a separate in-person follow-up with your provider.  Ongoing Care Seeing your primary care provider every 3 to 6 months helps us  monitor your health and provide consistent, personalized care.   Referrals If a referral was made during today's visit and you haven't received any updates within two weeks, please contact the referred provider directly to check on the status.  Recommended Screenings:  Health Maintenance  Topic Date Due   Zoster (Shingles) Vaccine (1 of 2) Never done   Eye exam for diabetics  10/16/2023   Yearly kidney health urinalysis for diabetes  11/13/2023   Hemoglobin A1C  03/21/2024   COVID-19 Vaccine (3 - Pfizer risk series) 05/01/2024*   Complete foot exam   08/05/2024   Yearly kidney function blood test for diabetes  01/13/2025   Medicare Annual Wellness Visit  03/26/2025   Breast Cancer Screening  05/28/2025   Pap with HPV screening  04/05/2027   Colon Cancer Screening  06/30/2031   DTaP/Tdap/Td vaccine (3 - Td or Tdap) 09/22/2033   Pneumococcal Vaccine for age over 28  Completed   Flu Shot  Completed   Hepatitis C Screening  Completed   HIV Screening  Completed   Hepatitis B Vaccine  Aged Out   HPV Vaccine  Aged Out   Meningitis B Vaccine  Aged Out  *Topic was  postponed. The date shown is not the original due date.       03/26/2024   11:19 AM  Advanced Directives  Does Patient Have a Medical Advance Directive? No  Would patient like information on creating a medical advance directive? No - Patient declined   Advance Care Planning is important because it: Ensures you receive medical care that aligns with your values, goals, and preferences. Provides guidance to your family and loved ones, reducing the emotional burden of decision-making during critical moments.  Vision: Annual vision screenings are recommended for early detection of glaucoma, cataracts, and diabetic retinopathy. These exams can also reveal signs of chronic conditions such as diabetes and high blood pressure.  Dental: Annual dental screenings help detect early signs of oral cancer, gum disease, and other conditions linked to overall health, including heart disease and diabetes.  Please see the attached documents for additional preventive care recommendations.

## 2024-03-26 NOTE — Progress Notes (Signed)
 Because this visit was a virtual/telehealth visit,  certain criteria was not obtained, such a blood pressure, CBG if applicable, and timed get up and go. Any medications not marked as taking were not mentioned during the medication reconciliation part of the visit. Any vitals not documented were not able to be obtained due to this being a telehealth visit or patient was unable to self-report a recent blood pressure reading due to a lack of equipment at home via telehealth. Vitals that have been documented are verbally provided by the patient.   Subjective:   Alyssa Montgomery is a 63 y.o. who presents for a Medicare Wellness preventive visit.  As a reminder, Annual Wellness Visits don't include a physical exam, and some assessments may be limited, especially if this visit is performed virtually. We may recommend an in-person follow-up visit with your provider if needed.  Visit Complete: Virtual I connected with  Alyssa Montgomery on 03/26/24 by a audio enabled telemedicine application and verified that I am speaking with the correct person using two identifiers.  Patient Location: Home  Provider Location: Home Office  I discussed the limitations of evaluation and management by telemedicine. The patient expressed understanding and agreed to proceed.  Vital Signs: Because this visit was a virtual/telehealth visit, some criteria may be missing or patient reported. Any vitals not documented were not able to be obtained and vitals that have been documented are patient reported.  VideoDeclined- This patient declined Librarian, academic. Therefore the visit was completed with audio only.  Persons Participating in Visit: Patient.  AWV Questionnaire: Yes: Patient Medicare AWV questionnaire was completed by the patient on 03/25/2024; I have confirmed that all information answered by patient is correct and no changes since this date.  Cardiac Risk Factors include: sedentary  lifestyle;diabetes mellitus;dyslipidemia;hypertension;obesity (BMI >30kg/m2);family history of premature cardiovascular disease     Objective:    Today's Vitals   03/26/24 1113 03/26/24 1116  Weight: 198 lb (89.8 kg)   Height: 5' 5 (1.651 m)   PainSc: 2  2   PainLoc: Knee    Body mass index is 32.95 kg/m.     03/26/2024   11:19 AM 01/13/2024    8:36 PM 09/20/2023   11:15 AM 08/06/2023    3:33 PM 07/08/2023    2:48 PM 06/26/2023    8:51 AM 06/17/2023    2:21 PM  Advanced Directives  Does Patient Have a Medical Advance Directive? No No No No No No No  Would patient like information on creating a medical advance directive? No - Patient declined   No - Patient declined No - Patient declined No - Patient declined     Current Medications (verified) Outpatient Encounter Medications as of 03/26/2024  Medication Sig   albuterol  (VENTOLIN  HFA) 108 (90 Base) MCG/ACT inhaler INHALE 2 PUFFS INTO THE LUNGS EVERY 6 HOURS AS NEEDED FOR WHEEZING/SHORTNESS OF BREATH   benzonatate  (TESSALON  PERLES) 100 MG capsule Take 1 capsule (100 mg total) by mouth every 6 (six) hours as needed for cough.   budesonide -formoterol  (SYMBICORT ) 160-4.5 MCG/ACT inhaler Inhale 2 puffs into the lungs in the morning and at bedtime.   Cholecalciferol (VITAMIN D3 PO) Take 1 tablet by mouth daily.   cyclobenzaprine  (FLEXERIL ) 10 MG tablet Take 1 tablet (10 mg total) by mouth 3 (three) times daily as needed for muscle spasms.   ECHINACEA PO Take 1 capsule by mouth daily.   ELDERBERRY PO Take 1 capsule by mouth daily.  famotidine  (PEPCID ) 20 MG tablet TAKE 1 TABLET BY MOUTH EVERY DAY   fluticasone  (FLONASE ) 50 MCG/ACT nasal spray SPRAY 1 SPRAY INTO EACH NOSTRIL DAILY   gabapentin  (NEURONTIN ) 100 MG capsule Take 1 capsule (100 mg total) by mouth 3 (three) times daily.   lansoprazole  (PREVACID ) 30 MG capsule TAKE 1 CAPSULE BY MOUTH EVERY DAY   latanoprost (XALATAN) 0.005 % ophthalmic solution Place 1 drop into the left eye at  bedtime.   lidocaine  (LIDODERM ) 5 % PLACE 1 PATCH ONTO THE SKIN DAILY AS NEEDED.PAIN REMOVE & DISCARD WITHIN 12 HOURS OR AS DIRECTED   Menthol -Camphor (TIGER BALM ARTHRITIS RUB EX) Apply topically.   metFORMIN  (GLUCOPHAGE -XR) 500 MG 24 hr tablet TAKE 2 TABLETS (1,000 MG TOTAL) BY MOUTH EVERY EVENING.   MORINGA OLEIFERA PO Take 1 tablet by mouth daily.   multivitamin-lutein (OCUVITE-LUTEIN) CAPS capsule Take 1 capsule by mouth daily.   oxyCODONE  (ROXICODONE ) 5 MG immediate release tablet Take 1-2 tablets (5-10 mg total) by mouth every 6 (six) hours as needed for severe pain (pain score 7-10).   phenazopyridine  (PYRIDIUM ) 95 MG tablet Take 1 tablet (95 mg total) by mouth 3 (three) times daily as needed for pain.   promethazine  (PHENERGAN ) 12.5 MG tablet Take 1 tablet (12.5 mg total) by mouth every 6 (six) hours as needed for nausea, vomiting or refractory nausea / vomiting.   promethazine  (PHENERGAN ) 12.5 MG tablet Take 1 tablet (12.5 mg total) by mouth every 6 (six) hours as needed for nausea or vomiting.   promethazine -dextromethorphan  (PROMETHAZINE -DM) 6.25-15 MG/5ML syrup Take 5 mLs by mouth 2 (two) times daily as needed for cough. (Patient not taking: Reported on 06/06/2023)   tirzepatide  (MOUNJARO ) 5 MG/0.5ML Pen Inject 5 mg into the skin once a week.   traMADol  (ULTRAM ) 50 MG tablet Take 1-2 tablets (50-100 mg total) by mouth every 6 (six) hours as needed for moderate pain (pain score 4-6).   zinc  gluconate 50 MG tablet Take 50 mg by mouth daily.   No facility-administered encounter medications on file as of 03/26/2024.    Allergies (verified) Zanaflex  [tizanidine  hcl], Penicillins, and Sulfonamide derivatives   History: Past Medical History:  Diagnosis Date   Anxiety    Bilateral carpal tunnel syndrome 03/03/2018   Bronchitis    Cervical spondylosis without myelopathy 07/29/2018   Cervicalgia 11/16/2014   Cervicogenic headache 07/29/2018   Chronic bilateral low back pain with  bilateral sciatica 09/18/2018   Claudication 09/03/2016   Costochondritis 02/08/2022   Diabetes mellitus without complication (HCC)    Diverticulitis    FIBROIDS, UTERUS 03/09/2010   Qualifier: Diagnosis of  By: Avram MD, NOLIA Pitts E    Fibromyalgia 06/18/2014   Gait abnormality 11/22/2020   GERD (gastroesophageal reflux disease)    Goiter 02/28/2013   Headache    History of cervical dysplasia 01/18/2016   Hyperlipidemia    Left carpal tunnel syndrome 01/27/2018   Left foot pain 12/20/2019   Left hip pain 11/23/2019   Neck pain 11/22/2020   Neuropathy    Non-seasonal allergic rhinitis 07/13/2019   Pain in right ankle and joints of right foot 03/17/2019   Plantar fasciitis    Primary osteoarthritis of left hip 09/12/2020   Right leg numbness 11/22/2020   Spinal stenosis of cervical region 07/29/2018   Substance abuse (HCC)    10 years ago ( cocaine)   TIA (transient ischemic attack) 08/10/2016   Tobacco use disorder 04/17/2015   Trochanteric bursitis of left hip 11/23/2019   Urinary  frequency 11/30/2019   UTI (urinary tract infection) 06/19/2012   Viral URI with cough 02/28/2013   Past Surgical History:  Procedure Laterality Date   ANKLE SURGERY     BACK SURGERY     Bowel obstruction     CARPAL TUNNEL RELEASE     CERVICAL CONIZATION W/BX N/A 12/27/2015   Procedure: CONIZATION CERVIX WITH BIOPSY;  Surgeon: Curlee VEAR Guan, MD;  Location: [facility];  Service: Gynecology;  Laterality: N/A;   DILATATION & CURETTAGE/HYSTEROSCOPY WITH MYOSURE N/A 12/27/2015   Procedure: DILATATION & CURETTAGE/HYSTEROSCOPY WITH MYOSURE;  Surgeon: Curlee VEAR Guan, MD;  Location: [facility];  Service: Gynecology;  Laterality: N/A;   ECTOPIC PREGNANCY SURGERY     EYE SURGERY     removed right eye;    EYE SURGERY     REVERSE SHOULDER ARTHROPLASTY Left 05/11/2022   Procedure: REVERSE SHOULDER ARTHROPLASTY;  Surgeon: Kay Kemps, MD;  Location: [facility];  Service: Orthopedics;  Laterality: Left;  with  ISB   TOTAL HIP ARTHROPLASTY Left 09/12/2020   Procedure: TOTAL HIP ARTHROPLASTY ANTERIOR APPROACH;  Surgeon: Melodi Lerner, MD;  Location: [facility];  Service: Orthopedics;  Laterality: Left;    TOTAL HIP ARTHROPLASTY Right 06/26/2023   Procedure: TOTAL HIP ARTHROPLASTY ANTERIOR APPROACH;  Surgeon: Melodi Lerner, MD;  Location: [facility];  Service: Orthopedics;  Laterality: Right;   Family History  Problem Relation Age of Onset   Hypertension Mother    Arthritis Mother    Cancer Father        lung   Other Neg Hx        pituitary problem   Colon cancer Neg Hx    Colon polyps Neg Hx    Esophageal cancer Neg Hx    Rectal cancer Neg Hx    Stomach cancer Neg Hx    Social History   Socioeconomic History   Marital status: Married    Spouse name: Not on file   Number of children: 2   Years of education: some college   Highest education level: 12th grade  Occupational History   Occupation: CNA/med tech  Tobacco Use   Smoking status: Former    Current packs/day: 0.00    Average packs/day: 0.5 packs/day for 18.0 years (9.0 ttl pk-yrs)    Types: Cigarettes    Start date: 04/26/2001    Quit date: 04/27/2019    Years since quitting: 4.9    Passive exposure: Never   Smokeless tobacco: Never   Tobacco comments:    1-800 number given   Vaping Use   Vaping status: Never Used  Substance and Sexual Activity   Alcohol  use: Yes    Comment: occ wine   Drug use: No    Comment: hx of cocaine 22 years ago   Sexual activity: Yes    Partners: Male    Birth control/protection: Post-menopausal  Other Topics Concern   Not on file  Social History Narrative   Lives with husband and mother (caregiver for her mother).   Right-handed.   One soda daily.   Social Drivers of Health   Financial Resource Strain: High Risk (03/26/2024)   Overall Financial Resource Strain (CARDIA)    Difficulty of Paying Living Expenses: Hard  Food Insecurity: Food Insecurity Present (03/26/2024)   Hunger  Vital Sign    Worried About Running Out of Food in the Last Year: Sometimes true    Ran Out of Food in the Last Year: Sometimes true  Transportation Needs: No Transportation Needs (  03/26/2024)   PRAPARE - Administrator, Civil Service (Medical): No    Lack of Transportation (Non-Medical): No  Physical Activity: Insufficiently Active (03/26/2024)   Exercise Vital Sign    Days of Exercise per Week: 2 days    Minutes of Exercise per Session: 10 min  Stress: No Stress Concern Present (03/26/2024)   [employer] of Occupational Health - Occupational Stress Questionnaire    Feeling of Stress: Only a little  Social Connections: Unknown (03/26/2024)   Social Connection and Isolation Panel    Frequency of Communication with Friends and Family: Once a week    Frequency of Social Gatherings with Friends and Family: Patient declined    Attends Religious Services: More than 4 times per year    Active Member of [employer] or Organizations: Yes    Attends Engineer, structural: More than 4 times per year    Marital Status: Married    Tobacco Counseling Counseling given: Not Answered Tobacco comments: 1-800 number given     Clinical Intake:  Pre-visit preparation completed: Yes  Pain : 0-10 Pain Score: 2  Pain Type: Chronic pain Pain Location: Knee Pain Orientation: Right Pain Descriptors / Indicators: Throbbing, Sore, Other (Comment) (swollen) Pain Onset: More than a month ago Pain Frequency: Constant Pain Relieving Factors: Pain Medication  Pain Relieving Factors: Pain Medication  BMI - recorded: 32.95 Nutritional Status: BMI > 30  Obese Nutritional Risks: None Diabetes: Yes CBG done?: No Did pt. bring in CBG monitor from home?: No  Lab Results  Component Value Date   HGBA1C 6.1 09/20/2023   HGBA1C 7.3 (H) 06/17/2023   HGBA1C 7.3 (A) 04/16/2023     How often do you need to have someone help you when you read instructions, pamphlets, or other written  materials from your doctor or pharmacy?: 1 - Never  Interpreter Needed?: No  Information entered by :: Pesach Frisch N. Lyndy Russman, LPN.   Activities of Daily Living     03/26/2024   11:20 AM 03/25/2024   11:25 AM  In your present state of health, do you have any difficulty performing the following activities:  Hearing? 0 0  Vision? 0 0  Difficulty concentrating or making decisions? 0 0  Walking or climbing stairs? 1 1  Dressing or bathing? 0 0  Doing errands, shopping? 1 1  Preparing Food and eating ? N N  Using the Toilet? N N  In the past six months, have you accidently leaked urine? Y Y  Do you have problems with loss of bowel control? N N  Managing your Medications? N N  Managing your Finances? N N  Housekeeping or managing your Housekeeping? N N    Patient Care Team: Romelle Booty, MD as PCP - General (Family Medicine) Cheryle Debby LABOR, MD as Consulting Physician (Neurosurgery) Octavia Charleston, MD as Referring Physician (Ophthalmology)  I have updated your Care Teams any recent Medical Services you may have received from other providers in the past year.     Assessment:   This is a routine wellness examination for [place].  Hearing/Vision screen Hearing Screening - Comments:: Patient has adequate hearing. Vision Screening - Comments:: Patient has adequate vision with the use of eyeglasses. Eye exam completed by [facility].    Goals Addressed             This Visit's Progress    03/26/2024       My goal for 2026 is to stay healthy, lose weight, to  be cured of diabetes and stay out of surgery.       Depression Screen     03/26/2024   11:22 AM 09/20/2023   11:15 AM 08/06/2023    3:32 PM 07/08/2023    2:49 PM 05/21/2023    2:54 PM 04/16/2023    4:07 PM 02/26/2023    2:17 PM  PHQ 2/9 Scores  PHQ - 2 Score 0 0  0 0 0 1  PHQ- 9 Score 0 0  1 0 2 2  Exception Documentation   Patient refusal        Fall Risk     03/26/2024   11:19 AM 03/25/2024   11:25  AM 02/26/2023    2:07 PM 01/17/2021    4:20 PM 12/17/2019    4:38 PM  Fall Risk   Falls in the past year? 1 1 1  0 1  Number falls in past yr: 0 0  0 0  Injury with Fall? 1 1   0  Follow up   Falls evaluation completed;Education provided;Falls prevention discussed  Falls evaluation completed      Data saved with a previous flowsheet row definition    MEDICARE RISK AT HOME:  Medicare Risk at Home Any stairs in or around the home?: Yes If so, are there any without handrails?: No Home free of loose throw rugs in walkways, pet beds, electrical cords, etc?: Yes Adequate lighting in your home to reduce risk of falls?: Yes Life alert?: No Use of a cane, walker or w/c?: Yes Grab bars in the bathroom?: No Shower chair or bench in shower?: No Elevated toilet seat or a handicapped toilet?: Yes  TIMED UP AND GO:  Was the test performed?  No  Cognitive Function: Declined/Normal: No cognitive concerns noted by patient or family. Patient alert, oriented, able to answer questions appropriately and recall recent events. No signs of memory loss or confusion.    03/26/2024   11:22 AM 11/13/2016    2:00 PM  MMSE - Mini Mental State Exam  Not completed: Unable to complete   Orientation to time  5   Orientation to Place  5   Registration  3   Attention/ Calculation  3   Recall  2   Language- name 2 objects  2   Language- repeat  1  Language- follow 3 step command  3   Language- read & follow direction  1   Write a sentence  1   Copy design  1   Total score  27      Data saved with a previous flowsheet row definition        03/26/2024   11:17 AM 02/26/2023    2:18 PM  6CIT Screen  What Year? 0 points 0 points  What month? 0 points 0 points  What time? 0 points 0 points  Count back from 20 0 points 0 points  Months in reverse 0 points 0 points  Repeat phrase 0 points 0 points  Total Score 0 points 0 points    Immunizations Immunization History  Administered Date(s) Administered     sv, Bivalent, Protein Subunit Rsvpref,pf (Abrysvo) 03/17/2024   Influenza Split 06/19/2012, 04/06/2015   Influenza, Seasonal, Injecte, Preservative Fre 02/18/2023   Influenza,inj,Quad PF,6+ Mos 04/17/2013, 02/21/2016, 01/30/2019, 06/24/2019   Influenza-Unspecified 03/11/2014, 02/11/2020, 04/03/2021, 02/11/2022, 03/17/2024   PFIZER(Purple Top)SARS-COV-2 Vaccination 08/21/2019, 09/14/2019   PNEUMOCOCCAL CONJUGATE-20 09/23/2023   PPD Test 04/17/2013, 06/22/2014   Pneumococcal Polysaccharide-23 04/15/2015   Rabies, IM  02/28/2017, 03/03/2017, 03/08/2017   Tdap 12/02/2015, 09/23/2023    Screening Tests Health Maintenance  Topic Date Due   Zoster Vaccines- Shingrix (1 of 2) Never done   OPHTHALMOLOGY EXAM  10/16/2023   Diabetic kidney evaluation - Urine ACR  11/13/2023   HEMOGLOBIN A1C  03/21/2024   COVID-19 Vaccine (3 - Pfizer risk series) 05/01/2024 (Originally 10/12/2019)   FOOT EXAM  08/05/2024   Diabetic kidney evaluation - eGFR measurement  01/13/2025   Medicare Annual Wellness (AWV)  03/26/2025   Mammogram  05/28/2025   Cervical Cancer Screening (HPV/Pap Cotest)  04/05/2027   Colonoscopy  06/30/2031   DTaP/Tdap/Td (3 - Td or Tdap) 09/22/2033   Pneumococcal Vaccine: 50+ Years  Completed   Influenza Vaccine  Completed   Hepatitis C Screening  Completed   HIV Screening  Completed   Hepatitis B Vaccines 19-59 Average Risk  Aged Out   HPV VACCINES  Aged Out   Meningococcal B Vaccine  Aged Out    Health Maintenance Items Addressed: Vaccines Due: Shingrix, Labs Due Diabetic Kidney Evaluation-Urine ACR, Hemoglobin A1C, and Diabetic Eye Exam.  Additional Screening:  Vision Screening: Recommended annual ophthalmology exams for early detection of glaucoma and other disorders of the eye. Is the patient up to date with their annual eye exam?  Yes  Who is the provider or what is the name of the office in which the patient attends annual eye exams? [facility] (will request records of  last eye exam)  Dental Screening: Recommended annual dental exams for proper oral hygiene  Community Resource Referral / Chronic Care Management: CRR required this visit?  No   CCM required this visit?  No   Plan:    I have personally reviewed and noted the following in the patient's chart:   Medical and social history Use of alcohol , tobacco or illicit drugs  Current medications and supplements including opioid prescriptions. Patient is currently taking opioid prescriptions. Information provided to patient regarding non-opioid alternatives. Patient advised to discuss non-opioid treatment plan with their provider. Functional ability and status Nutritional status Physical activity Advanced directives List of other physicians Hospitalizations, surgeries, and ER visits in previous 12 months Vitals Screenings to include cognitive, depression, and falls Referrals and appointments  In addition, I have reviewed and discussed with patient certain preventive protocols, quality metrics, and best practice recommendations. A written personalized care plan for preventive services as well as general preventive health recommendations were provided to patient.   Roz LOISE Fuller, LPN   [id number]   After Visit Summary: (MyChart) Due to this being a telephonic visit, the after visit summary with patients personalized plan was offered to patient via MyChart   Notes: Please refer to Routing Comments.

## 2024-03-30 ENCOUNTER — Ambulatory Visit: Admitting: Family Medicine

## 2024-04-06 ENCOUNTER — Ambulatory Visit: Admitting: Family Medicine

## 2024-04-07 ENCOUNTER — Encounter: Payer: Self-pay | Admitting: Family Medicine

## 2024-04-07 ENCOUNTER — Ambulatory Visit (INDEPENDENT_AMBULATORY_CARE_PROVIDER_SITE_OTHER): Admitting: Family Medicine

## 2024-04-07 VITALS — BP 138/84 | HR 66 | Ht 65.0 in | Wt 231.4 lb

## 2024-04-07 DIAGNOSIS — R350 Frequency of micturition: Secondary | ICD-10-CM | POA: Diagnosis not present

## 2024-04-07 DIAGNOSIS — G629 Polyneuropathy, unspecified: Secondary | ICD-10-CM

## 2024-04-07 DIAGNOSIS — M7989 Other specified soft tissue disorders: Secondary | ICD-10-CM

## 2024-04-07 DIAGNOSIS — E669 Obesity, unspecified: Secondary | ICD-10-CM | POA: Diagnosis not present

## 2024-04-07 DIAGNOSIS — E119 Type 2 diabetes mellitus without complications: Secondary | ICD-10-CM | POA: Diagnosis not present

## 2024-04-07 LAB — POCT UA - MICROSCOPIC ONLY: RBC, Urine, Miroscopic: NONE SEEN (ref 0–2)

## 2024-04-07 LAB — POCT URINALYSIS DIP (MANUAL ENTRY)
Bilirubin, UA: NEGATIVE
Blood, UA: NEGATIVE
Glucose, UA: NEGATIVE mg/dL
Nitrite, UA: NEGATIVE
Protein Ur, POC: NEGATIVE mg/dL
Spec Grav, UA: 1.025 (ref 1.010–1.025)
Urobilinogen, UA: 1 U/dL
pH, UA: 5.5 (ref 5.0–8.0)

## 2024-04-07 LAB — POCT GLYCOSYLATED HEMOGLOBIN (HGB A1C): HbA1c, POC (controlled diabetic range): 6.9 % (ref 0.0–7.0)

## 2024-04-07 MED ORDER — MOUNJARO 2.5 MG/0.5ML ~~LOC~~ SOAJ: 2.5000 mg | SUBCUTANEOUS | 2 refills | Status: DC

## 2024-04-07 MED ORDER — GABAPENTIN 100 MG PO CAPS: 100.0000 mg | ORAL_CAPSULE | Freq: Three times a day (TID) | ORAL | 0 refills | Status: AC

## 2024-04-07 NOTE — Assessment & Plan Note (Signed)
 Restart mounjaro  2.5mg  weekly Continue metformin  A1c 6.9 today Obtain UACR

## 2024-04-07 NOTE — Patient Instructions (Addendum)
 If any of your results from today are abnormal and/or require changes to your medical care, I will give you a call. Otherwise, I will send you a letter in the mail or a message on MyChart.   Get your DVT ultrasound done as scheduled.  If it is positive I will send in a blood thinner  Start Mounjaro  2.5 mg weekly  Take gabapentin  as prescribed  Reach out to your orthopedic doctor for refills on oxycodone  and/or tramadol 

## 2024-04-07 NOTE — Progress Notes (Signed)
=     SUBJECTIVE:   CHIEF COMPLAINT / HPI:   T2DM -Current medication regimen: Metformin  1000 mg daily,  -Home CBGs: doesn't check -Denies polydipsia, abdominal pain, chest pain, shortness of breath -Foot exam: UTD -Eye exam: plans to schedule  Lab Results  Component Value Date   HGBA1C 6.9 04/07/2024   HGBA1C 6.1 09/20/2023   HGBA1C 7.3 (H) 06/17/2023       Discussed the use of AI scribe software for clinical note transcription with the patient, who gave verbal consent to proceed.  History of Present Illness Alyssa Montgomery is a 63 year old female with diabetes who presents with right leg swelling and pain.  Right lower extremity edema and pain - Swelling in the right leg for three days, beginning at the knee and extending to the entire foot - Significant swelling and pain in the right foot - Pain is throbbing; foot feels numb and sore - No recent injury to the foot or ankle - No recent travel - Concern for blood clot; no personal history of blood clots - Family history of blood clot in mother at age 24 - History of knee issues; currently consulting with orthopedic specialist who recommended injections - Currently using oxycodone  at night. Has not been taking gabapentin   Diabetes mellitus management - Previously managed with Mounjaro ; discontinued due to adverse effects at higher dose - Initial dose of Mounjaro  2.5 mg was effective, she would like to restart this - Increase to 5 mg Mounjaro  caused severe nausea and rapid drops in blood sugar - Currently taking metformin  1000 mg twice daily - Recent A1c is 6.9, previously 6.1  Urinary symptoms - Urinary frequency - Fishy odor to urine - No dysuria or hematuria - History of urinary tract infections; last treatment in August - Uncertain if current symptoms indicate new UTI     PERTINENT  PMH / PSH: History of HLD, diabetes with peripheral neuropathy, OA, obesity  OBJECTIVE:   BP 138/84   Pulse 66   Ht 5' 5  (1.651 m)   Wt 231 lb 6.4 oz (105 kg)   SpO2 98%   BMI 38.51 kg/m    General: NAD, pleasant, able to participate in exam Cardiac: RRR, no murmurs auscultated Respiratory: CTAB, normal WOB Abdomen: soft, non-tender, non-distended, normoactive bowel sounds Extremities: RLE with significant nonpitting edema and tightness compared to left. No significant bony tenderness throughout foot and ankle. 2+DP pulse. Skin: warm and dry, no rashes noted Neuro: alert, no obvious focal deficits, speech normal Psych: Normal affect and mood  ASSESSMENT/PLAN:     Assessment & Plan Diabetes mellitus without complication (HCC) Obesity (BMI 30-39.9) Restart mounjaro  2.5mg  weekly Continue metformin  A1c 6.9 today Obtain UACR Right leg swelling Significant swelling compared to left with pain and tightness Obtain DVT US  - scheduled for tomorrow Plan to start eliquis if positive Reassuringly no hx DVT, risk factor includes recent surgery earlier this year but no recent immobilization. Also reported family hx of DVT Urinary frequency Chronic, was treated for UTI earlier this year UA today without sign of acute infection (negative nitrites and trace leukocytes and few bacteria and squames and 5-10 WBC, suspect more likely contaminant as opposed to most recent UTI when she had large leukocytes and >50WBC) Neuropathy Refilled gabapentin    Payton Coward, MD [facility]

## 2024-04-07 NOTE — Assessment & Plan Note (Signed)
 Refilled gabapentin

## 2024-04-08 ENCOUNTER — Ambulatory Visit (HOSPITAL_COMMUNITY)
Admission: RE | Admit: 2024-04-08 | Discharge: 2024-04-08 | Disposition: A | Discharge status: Home / Self Care | Source: Ambulatory Visit | Attending: Family Medicine | Admitting: Family Medicine

## 2024-04-08 DIAGNOSIS — M7989 Other specified soft tissue disorders: Secondary | ICD-10-CM | POA: Diagnosis not present

## 2024-04-08 LAB — MICROALBUMIN / CREATININE URINE RATIO
Creatinine, Urine: 132.2 mg/dL
Microalb/Creat Ratio: 26 mg/g{creat} (ref 0–29)
Microalbumin, Urine: 34.4 ug/mL

## 2024-04-09 ENCOUNTER — Ambulatory Visit: Payer: Self-pay | Admitting: Family Medicine

## 2024-04-12 ENCOUNTER — Other Ambulatory Visit: Payer: Self-pay | Admitting: Family Medicine

## 2024-04-12 DIAGNOSIS — M75121 Complete rotator cuff tear or rupture of right shoulder, not specified as traumatic: Secondary | ICD-10-CM

## 2024-04-12 DIAGNOSIS — M25511 Pain in right shoulder: Secondary | ICD-10-CM

## 2024-05-14 ENCOUNTER — Ambulatory Visit (HOSPITAL_COMMUNITY)

## 2024-05-14 ENCOUNTER — Encounter (HOSPITAL_COMMUNITY): Payer: Self-pay

## 2024-05-14 ENCOUNTER — Ambulatory Visit (HOSPITAL_COMMUNITY): Admission: EM | Admit: 2024-05-14 | Discharge: 2024-05-14 | Disposition: A | Discharge status: Home / Self Care

## 2024-05-14 DIAGNOSIS — J069 Acute upper respiratory infection, unspecified: Secondary | ICD-10-CM | POA: Diagnosis not present

## 2024-05-14 DIAGNOSIS — R350 Frequency of micturition: Secondary | ICD-10-CM

## 2024-05-14 DIAGNOSIS — R051 Acute cough: Secondary | ICD-10-CM | POA: Diagnosis not present

## 2024-05-14 LAB — POCT URINE DIPSTICK
Bilirubin, UA: NEGATIVE
Glucose, UA: NEGATIVE mg/dL
Nitrite, UA: POSITIVE — AB
Spec Grav, UA: 1.02 (ref 1.010–1.025)
Urobilinogen, UA: 1 U/dL
pH, UA: 6.5 (ref 5.0–8.0)

## 2024-05-14 MED ORDER — BENZONATATE 100 MG PO CAPS: 100.0000 mg | ORAL_CAPSULE | Freq: Three times a day (TID) | ORAL | 0 refills | Status: AC | PRN

## 2024-05-14 MED ORDER — PHENAZOPYRIDINE HCL 200 MG PO TABS: 200.0000 mg | ORAL_TABLET | Freq: Three times a day (TID) | ORAL | 0 refills | Status: AC | PRN

## 2024-05-14 MED ORDER — IPRATROPIUM-ALBUTEROL 0.5-2.5 (3) MG/3ML IN SOLN
3.0000 mL | Freq: Once | RESPIRATORY_TRACT | Status: AC
  Administered 2024-05-14: 3 mL via RESPIRATORY_TRACT

## 2024-05-14 MED ORDER — PROMETHAZINE-DM 6.25-15 MG/5ML PO SYRP: 5.0000 mL | ORAL_SOLUTION | Freq: Every evening | ORAL | 0 refills | Status: AC | PRN

## 2024-05-14 MED ORDER — NITROFURANTOIN MONOHYD MACRO 100 MG PO CAPS: 100.0000 mg | ORAL_CAPSULE | Freq: Two times a day (BID) | ORAL | 0 refills | Status: AC

## 2024-05-14 MED ORDER — IPRATROPIUM-ALBUTEROL 0.5-2.5 (3) MG/3ML IN SOLN
RESPIRATORY_TRACT | Status: AC
  Filled 2024-05-14: qty 3

## 2024-05-14 NOTE — ED Provider Notes (Addendum)
 [facility]    CSN: [id number] Arrival date & time: 05/14/24  1539      History   Chief Complaint Chief Complaint  Patient presents with   Headache   Cough   Urinary Frequency   Back Pain    HPI Alyssa Montgomery is a 63 y.o. female.   This 63 year old female is being seen for multiple complaints.  She reports congested cough, nasal congestion for 3 weeks.  She reports chest pain with coughing.  She has taken NyQuil without significant improvement.  She reports headache for 5 days.  She reports yesterday headache was posterior, today headache is frontal which she attributes to her sinuses.  She reports sinus pressure and pain.  She reports frequently having chills, unknown if fever.  She is afebrile today.  She reports urinary frequency, dysuria for 1 week.  She reports a pulling sensation in her suprapubic area when urinating.  She says she feels she is unable to fully empty her bladder.  She denies ear pain, dizziness.  She reports chest pain with coughing.  She denies shortness of breath.  She denies abdominal pain, nausea, vomiting.   Headache Associated symptoms: back pain, congestion, cough and sinus pressure   Associated symptoms: no abdominal pain, no drainage, no ear pain, no fever, no nausea, no sore throat and no vomiting   Cough Associated symptoms: chest pain, chills and headaches   Associated symptoms: no ear pain, no fever, no rhinorrhea, no shortness of breath and no sore throat   Urinary Frequency Associated symptoms include chest pain and headaches. Pertinent negatives include no abdominal pain and no shortness of breath.  Back Pain Associated symptoms: chest pain, dysuria and headaches   Associated symptoms: no abdominal pain and no fever     Past Medical History:  Diagnosis Date   Anxiety    Bilateral carpal tunnel syndrome 03/03/2018   Bronchitis    Cervical spondylosis without myelopathy 07/29/2018   Cervicalgia 11/16/2014   Cervicogenic  headache 07/29/2018   Chronic bilateral low back pain with bilateral sciatica 09/18/2018   Claudication 09/03/2016   Costochondritis 02/08/2022   Diabetes mellitus without complication (HCC)    Diverticulitis    FIBROIDS, UTERUS 03/09/2010   Qualifier: Diagnosis of  By: Avram MD, NOLIA Pitts E    Fibromyalgia 06/18/2014   Gait abnormality 11/22/2020   GERD (gastroesophageal reflux disease)    Goiter 02/28/2013   Headache    History of cervical dysplasia 01/18/2016   Hyperlipidemia    Left carpal tunnel syndrome 01/27/2018   Left foot pain 12/20/2019   Left hip pain 11/23/2019   Neck pain 11/22/2020   Neuropathy    Non-seasonal allergic rhinitis 07/13/2019   Pain in right ankle and joints of right foot 03/17/2019   Plantar fasciitis    Primary osteoarthritis of left hip 09/12/2020   Right leg numbness 11/22/2020   Spinal stenosis of cervical region 07/29/2018   Substance abuse (HCC)    10 years ago ( cocaine)   TIA (transient ischemic attack) 08/10/2016   Tobacco use disorder 04/17/2015   Trochanteric bursitis of left hip 11/23/2019   Urinary frequency 11/30/2019   UTI (urinary tract infection) 06/19/2012   Viral URI with cough 02/28/2013    Patient Active Problem List   Diagnosis Date Noted   Stress incontinence of urine 09/20/2023   OA (osteoarthritis) of hip 06/26/2023   Osteoarthritis of right hip 06/26/2023   Leg cramps 02/06/2023   Abdominal bloating 02/06/2023  Need for shingles vaccine 11/14/2022   Screening for lung cancer 11/14/2022   Cough 11/07/2022   H/O total shoulder replacement, 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   Diverticulosis 07/06/2021   Morbid obesity (HCC) 04/27/2021   BMI 40.0-44.9, adult (HCC) 04/27/2021   Peripheral polyneuropathy 05/27/2020   Neuropathy 02/15/2020   Bilateral primary osteoarthritis of hip 02/10/2020   Foot pain, left 12/20/2019   Hyperlipidemia associated with type 2 diabetes mellitus (HCC) 11/14/2017   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    Other fatigue 08/10/2016   Diabetes mellitus without complication (HCC) 04/17/2015   History of tobacco use 04/17/2015   Complete tear of right rotator cuff 11/03/2014   Back pain 06/19/2012   GERD 12/26/2006    Past Surgical History:  Procedure Laterality Date   ANKLE SURGERY     BACK SURGERY     Bowel obstruction     CARPAL TUNNEL RELEASE     CERVICAL CONIZATION W/BX N/A 12/27/2015   Procedure: CONIZATION CERVIX WITH BIOPSY;  Surgeon: Curlee VEAR Guan, MD;  Location: [facility];  Service: Gynecology;  Laterality: N/A;   DILATATION & CURETTAGE/HYSTEROSCOPY WITH MYOSURE N/A 12/27/2015   Procedure: DILATATION & CURETTAGE/HYSTEROSCOPY WITH MYOSURE;  Surgeon: Curlee VEAR Guan, MD;  Location: [facility];  Service: Gynecology;  Laterality: N/A;   ECTOPIC PREGNANCY SURGERY     EYE SURGERY     removed right eye;    EYE SURGERY     REVERSE SHOULDER ARTHROPLASTY Left 05/11/2022   Procedure: REVERSE SHOULDER ARTHROPLASTY;  Surgeon: Kay Kemps, MD;  Location: [facility];  Service: Orthopedics;  Laterality: Left;  with ISB   TOTAL HIP ARTHROPLASTY Left 09/12/2020   Procedure: TOTAL HIP ARTHROPLASTY ANTERIOR APPROACH;  Surgeon: Melodi Lerner, MD;  Location: [facility];  Service: Orthopedics;  Laterality: Left;    TOTAL HIP ARTHROPLASTY Right 06/26/2023   Procedure: TOTAL HIP ARTHROPLASTY ANTERIOR APPROACH;  Surgeon: Melodi Lerner, MD;  Location: [facility];  Service: Orthopedics;  Laterality: Right;    OB History     Gravida  6   Para  2   Term  2   Preterm      AB  4   Living  2      SAB  2   IAB  1   Ectopic  1   Multiple      Live Births               Home Medications    Prior to Admission medications   Medication Sig Start Date End Date Taking? Authorizing Provider  benzonatate  (TESSALON ) 100 MG capsule Take 1 capsule (100 mg total) by mouth every 8 (eight) hours as needed for cough. 05/14/24  Yes Lennice Jon BROCKS, FNP  nitrofurantoin , macrocrystal-monohydrate, (MACROBID )  100 MG capsule Take 1 capsule (100 mg total) by mouth 2 (two) times daily for 5 days. 05/14/24 05/19/24 Yes Deundra Furber C, FNP  phenazopyridine  (PYRIDIUM ) 200 MG tablet Take 1 tablet (200 mg total) by mouth every 8 (eight) hours as needed for pain. 05/14/24  Yes Lennice Jon BROCKS, FNP  promethazine -dextromethorphan  (PROMETHAZINE -DM) 6.25-15 MG/5ML syrup Take 5 mLs by mouth at bedtime as needed for cough. 05/14/24  Yes Koya Hunger C, FNP  albuterol  (VENTOLIN  HFA) 108 (90 Base) MCG/ACT inhaler INHALE 2 PUFFS INTO THE LUNGS EVERY 6 HOURS AS NEEDED FOR WHEEZING/SHORTNESS OF BREATH 02/13/24   Romelle Booty, MD  budesonide -formoterol  (SYMBICORT ) 160-4.5 MCG/ACT inhaler Inhale 2 puffs into the lungs in the morning and at bedtime. Patient not taking: Reported on  05/14/2024 04/23/23   Raspet, Erin K, PA-C  Cholecalciferol (VITAMIN D3 PO) Take 1 tablet by mouth daily.    [provider]  cyclobenzaprine  (FLEXERIL ) 10 MG tablet Take 1 tablet (10 mg total) by mouth 3 (three) times daily as needed for muscle spasms. 06/28/23   Kristian Stabs, PA  ECHINACEA PO Take 1 capsule by mouth daily.    [provider]  ELDERBERRY PO Take 1 capsule by mouth daily.    [provider]  famotidine  (PEPCID ) 20 MG tablet TAKE 1 TABLET BY MOUTH EVERY DAY 09/19/23   Elicia Hamlet, MD  fluticasone  (FLONASE ) 50 MCG/ACT nasal spray SPRAY 1 SPRAY INTO EACH NOSTRIL DAILY 03/13/24   Nicholas Bar, MD  gabapentin  (NEURONTIN ) 100 MG capsule Take 1 capsule (100 mg total) by mouth 3 (three) times daily. 04/07/24   Romelle Booty, MD  lansoprazole  (PREVACID ) 30 MG capsule TAKE 1 CAPSULE BY MOUTH EVERY DAY 11/08/23   Romelle Booty, MD  latanoprost (XALATAN) 0.005 % ophthalmic solution Place 1 drop into the left eye at bedtime. 04/19/23   [provider]  lidocaine  (LIDODERM ) 5 % PLACE 1 PATCH ONTO THE SKIN DAILY AS NEEDED.PAIN REMOVE & DISCARD WITHIN 12 HOURS OR AS DIRECTED 04/13/24   Romelle Booty, MD   Menthol -Camphor (TIGER BALM ARTHRITIS RUB EX) Apply topically.    [provider]  metFORMIN  (GLUCOPHAGE -XR) 500 MG 24 hr tablet TAKE 2 TABLETS (1,000 MG TOTAL) BY MOUTH EVERY EVENING. 12/19/23   Romelle Booty, MD  MORINGA OLEIFERA PO Take 1 tablet by mouth daily.    [provider]  multivitamin-lutein (OCUVITE-LUTEIN) CAPS capsule Take 1 capsule by mouth daily.    [provider]  oxyCODONE  (ROXICODONE ) 5 MG immediate release tablet Take 1-2 tablets (5-10 mg total) by mouth every 6 (six) hours as needed for severe pain (pain score 7-10). 06/28/23   Kristian Stabs, PA  promethazine  (PHENERGAN ) 12.5 MG tablet Take 1 tablet (12.5 mg total) by mouth every 6 (six) hours as needed for nausea, vomiting or refractory nausea / vomiting. Patient not taking: Reported on 05/14/2024 05/12/22   Kay Kemps, MD  promethazine  (PHENERGAN ) 12.5 MG tablet Take 1 tablet (12.5 mg total) by mouth every 6 (six) hours as needed for nausea or vomiting. 06/28/23   Kristian Stabs, PA  tirzepatide  (MOUNJARO ) 2.5 MG/0.5ML Pen Inject 2.5 mg into the skin once a week. 04/07/24   Romelle Booty, MD  traMADol  (ULTRAM ) 50 MG tablet Take 1-2 tablets (50-100 mg total) by mouth every 6 (six) hours as needed for moderate pain (pain score 4-6). 06/28/23   Kristian Stabs, PA  zinc  gluconate 50 MG tablet Take 50 mg by mouth daily.    [provider]    Family History Family History  Problem Relation Age of Onset   Hypertension Mother    Arthritis Mother    Cancer Father        lung   Other Neg Hx        pituitary problem   Colon cancer Neg Hx    Colon polyps Neg Hx    Esophageal cancer Neg Hx    Rectal cancer Neg Hx    Stomach cancer Neg Hx     Social History Social History   Tobacco Use   Smoking status: Former    Current packs/day: 0.00    Average packs/day: 0.5 packs/day for 18.0 years (9.0 ttl pk-yrs)    Types: Cigarettes    Start date: 04/26/2001    Quit date:  04/27/2019     Years since quitting: 5.0    Passive exposure: Never   Smokeless tobacco: Never   Tobacco comments:    1-800 number given   Vaping Use   Vaping status: Never Used  Substance Use Topics   Alcohol  use: Yes    Comment: occ wine   Drug use: No    Comment: hx of cocaine 22 years ago     Allergies   Zanaflex  [tizanidine  hcl], Penicillins, and Sulfonamide derivatives   Review of Systems Review of Systems  Constitutional:  Positive for chills. Negative for appetite change and fever.  HENT:  Positive for congestion, sinus pressure and sinus pain. Negative for ear pain, postnasal drip, rhinorrhea, sore throat and trouble swallowing.   Respiratory:  Positive for cough. Negative for shortness of breath.   Cardiovascular:  Positive for chest pain.       With coughing.  Gastrointestinal:  Negative for abdominal pain, nausea and vomiting.  Genitourinary:  Positive for dysuria and frequency. Negative for hematuria and urgency.  Musculoskeletal:  Positive for back pain. Negative for arthralgias.       History of back pain.  Neurological:  Positive for headaches.  All other systems reviewed and are negative.    Physical Exam Triage Vital Signs ED Triage Vitals  Encounter Vitals Group     BP 05/14/24 1717 (!) 156/87     Girls Systolic BP Percentile --      Girls Diastolic BP Percentile --      Boys Systolic BP Percentile --      Boys Diastolic BP Percentile --      Pulse Rate 05/14/24 1717 64     Resp 05/14/24 1717 16     Temp 05/14/24 1717 98.2 F (36.8 C)     Temp Source 05/14/24 1717 Oral     SpO2 05/14/24 1717 93 %     Weight --      Height --      Head Circumference --      Peak Flow --      Pain Score 05/14/24 1716 8     Pain Loc --      Pain Education --      Exclude from Growth Chart --    No data found.  Updated Vital Signs BP (!) 156/87 (BP Location: Left Arm)   Pulse 64   Temp 98.2 F (36.8 C) (Oral)   Resp 16   SpO2 93%   Visual Acuity Right Eye  Distance:   Left Eye Distance:   Bilateral Distance:    Right Eye Near:   Left Eye Near:    Bilateral Near:     Physical Exam Vitals and nursing note reviewed.  Constitutional:      General: She is not in acute distress.    Appearance: She is well-developed. She is not ill-appearing or toxic-appearing.     Comments: Pleasant female appearing stated age found sitting in chair in no acute distress.  HENT:     Head: Normocephalic and atraumatic.     Right Ear: Tympanic membrane, ear canal and external ear normal.     Left Ear: Tympanic membrane, ear canal and external ear normal.     Nose: Mucosal edema and congestion present.     Right Turbinates: Enlarged.     Left Turbinates: Enlarged.     Mouth/Throat:     Lips: Pink.     Mouth: Mucous membranes are moist.     Pharynx: Oropharynx  is clear. No oropharyngeal exudate or posterior oropharyngeal erythema.  Eyes:     Conjunctiva/sclera: Conjunctivae normal.  Cardiovascular:     Rate and Rhythm: Normal rate and regular rhythm.     Heart sounds: Normal heart sounds. No murmur heard. Pulmonary:     Effort: Pulmonary effort is normal. No respiratory distress.     Breath sounds: Examination of the right-lower field reveals decreased breath sounds. Examination of the left-lower field reveals decreased breath sounds. Decreased breath sounds present. No wheezing, rhonchi or rales.  Abdominal:     General: Bowel sounds are normal.     Palpations: Abdomen is soft.     Tenderness: There is no abdominal tenderness. There is no guarding.  Musculoskeletal:        General: No swelling.     Cervical back: Neck supple.  Lymphadenopathy:     Cervical: No cervical adenopathy.  Skin:    General: Skin is warm and dry.     Capillary Refill: Capillary refill takes less than 2 seconds.  Neurological:     Mental Status: She is alert.  Psychiatric:        Mood and Affect: Mood normal.      UC Treatments / Results  Labs (all labs ordered are  listed, but only abnormal results are displayed) Labs Reviewed  POCT URINE DIPSTICK - Abnormal; Notable for the following components:      Result Value   Clarity, UA cloudy (*)    Ketones, POC UA trace (5) (*)    Blood, UA trace-intact (*)    Nitrite, UA Positive (*)    Leukocytes, UA Moderate (2+) (*)    All other components within normal limits    EKG   Radiology DG Chest 2 View Result Date: 05/14/2024 CLINICAL DATA:  cough EXAM: CHEST - 2 VIEW COMPARISON:  06/28/2023 FINDINGS: No focal airspace consolidation, pleural effusion, or pneumothorax. No cardiomegaly. Aortic atherosclerosis. No acute fracture or destructive lesions. Multilevel thoracic osteophytosis. Left shoulder arthroplasty. IMPRESSION: No acute cardiopulmonary abnormality. Electronically Signed   By: Rogelia Myers M.D.   On: 05/14/2024 18:56    Procedures Procedures (including critical care time)  Medications Ordered in UC Medications  ipratropium-albuterol  (DUONEB) 0.5-2.5 (3) MG/3ML nebulizer solution 3 mL (3 mLs Nebulization Given 05/14/24 1748)    Initial Impression / Assessment and Plan / UC Course  I have reviewed the triage vital signs and the nursing notes.  Pertinent labs & imaging results that were available during my care of the patient were reviewed by me and considered in my medical decision making (see chart for details).     Vitals and triage reviewed, patient is hemodynamically stable.  Patient with urinary tract infection and suspected viral upper respiratory infection.  Point-of-care urinalysis suggestive of urinary tract infection with 2+ leukocytes, positive nitrites.  She received DuoNeb treatment in clinic.  Oxygen saturation increased from 93% to 95%.  Patient reports improved breathing.  Chest x-ray negative for acute findings.  She is given prescription for Macrobid , Pyridium , promethazine -dextromethorphan  cough syrup, and Tessalon .  She is advised if any changes needed in antibiotic due to  culture results, she will be notified via telephone.  Plan of care, follow-up care, return precautions given, no questions at this time. Final Clinical Impressions(s) / UC Diagnoses   Final diagnoses:  Acute cough  Urine frequency  Viral upper respiratory tract infection     Discharge Instructions      You have a viral respiratory illness which will improve  on its own with rest, fluids, and medications to help with your symptoms.  You have been prescribed promethazine -dextromethorphan  to take at night for cough.  This medication may make you sleepy.  Please do not drive or operate machinery while taking.  You have been prescribed Tessalon .  Take every 8 hours as needed for cough.  Tylenol  and saline nasal sprays may help relieve symptoms.   Two teaspoons of honey in 1 cup of warm water  every 4-6 hours may help with throat pains.  Humidifier in room at nighttime may help soothe cough (clean well daily).   You have a urinary tract infection. You have been prescribed Macrobid .  Take 1 tablet twice daily for 5 days. Your urine will have further testing.  If any changes are needed in antibiotic you will be notified via telephone. You have been prescribed Pyridium .  Take 1 tablet every 8 hours as needed for bladder spasms.  Drink plenty of fluids to stay hydrated.  For chest pain, shortness of breath, inability to keep food or fluids down without vomiting, fever that does not respond to tylenol  or motrin , or any other severe symptoms, please go to the ER for further evaluation. Return to urgent care as needed, otherwise follow-up with PCP.       ED Prescriptions     Medication Sig Dispense Auth. Provider   nitrofurantoin , macrocrystal-monohydrate, (MACROBID ) 100 MG capsule Take 1 capsule (100 mg total) by mouth 2 (two) times daily for 5 days. 10 capsule Jaecion Dempster C, FNP   promethazine -dextromethorphan  (PROMETHAZINE -DM) 6.25-15 MG/5ML syrup Take 5 mLs by mouth at bedtime as  needed for cough. 118 mL Keldan Eplin C, FNP   phenazopyridine  (PYRIDIUM ) 200 MG tablet Take 1 tablet (200 mg total) by mouth every 8 (eight) hours as needed for pain. 6 tablet Geri Hepler C, FNP   benzonatate  (TESSALON ) 100 MG capsule Take 1 capsule (100 mg total) by mouth every 8 (eight) hours as needed for cough. 21 capsule Onica Davidovich C, FNP      I have reviewed the PDMP during this encounter.   Lennice Jon BROCKS, FNP 05/14/24 1928    Lennice Jon BROCKS, FNP 05/14/24 1929

## 2024-05-14 NOTE — Discharge Instructions (Addendum)
 You have a viral respiratory illness which will improve on its own with rest, fluids, and medications to help with your symptoms.  You have been prescribed promethazine -dextromethorphan  to take at night for cough.  This medication may make you sleepy.  Please do not drive or operate machinery while taking.  You have been prescribed Tessalon .  Take every 8 hours as needed for cough.  Tylenol  and saline nasal sprays may help relieve symptoms.   Two teaspoons of honey in 1 cup of warm water  every 4-6 hours may help with throat pains.  Humidifier in room at nighttime may help soothe cough (clean well daily).   You have a urinary tract infection. You have been prescribed Macrobid .  Take 1 tablet twice daily for 5 days. Your urine will have further testing.  If any changes are needed in antibiotic you will be notified via telephone. You have been prescribed Pyridium .  Take 1 tablet every 8 hours as needed for bladder spasms.  Drink plenty of fluids to stay hydrated.  For chest pain, shortness of breath, inability to keep food or fluids down without vomiting, fever that does not respond to tylenol  or motrin , or any other severe symptoms, please go to the ER for further evaluation. Return to urgent care as needed, otherwise follow-up with PCP.

## 2024-05-14 NOTE — ED Triage Notes (Signed)
 Patient reports that that she has had a cough x 3 weeks and headache x 5 days.  Patient also c/o urinary frequency , dysuria and bilateral low back pain L>R.  Patient states she has taken 55 old doxycycline  from an old prescription.

## 2024-06-06 ENCOUNTER — Other Ambulatory Visit: Payer: Self-pay | Admitting: Family Medicine

## 2024-06-06 DIAGNOSIS — M75121 Complete rotator cuff tear or rupture of right shoulder, not specified as traumatic: Secondary | ICD-10-CM

## 2024-06-06 DIAGNOSIS — M25511 Pain in right shoulder: Secondary | ICD-10-CM

## 2024-06-08 ENCOUNTER — Ambulatory Visit (HOSPITAL_BASED_OUTPATIENT_CLINIC_OR_DEPARTMENT_OTHER): Admitting: Physical Therapy

## 2024-06-14 NOTE — Therapy (Incomplete)
 " OUTPATIENT PHYSICAL THERAPY THORACOLUMBAR EVALUATION   Patient Name: Alyssa Montgomery MRN: [id number] DOB:05-Jun-1961, 64 y.o., female Today's Date: 06/14/2024  END OF SESSION:   Past Medical History:  Diagnosis Date   Anxiety    Bilateral carpal tunnel syndrome 03/03/2018   Bronchitis    Cervical spondylosis without myelopathy 07/29/2018   Cervicalgia 11/16/2014   Cervicogenic headache 07/29/2018   Chronic bilateral low back pain with bilateral sciatica 09/18/2018   Claudication 09/03/2016   Costochondritis 02/08/2022   Diabetes mellitus without complication (HCC)    Diverticulitis    FIBROIDS, UTERUS 03/09/2010   Qualifier: Diagnosis of  By: Avram MD, NOLIA Pitts E    Fibromyalgia 06/18/2014   Gait abnormality 11/22/2020   GERD (gastroesophageal reflux disease)    Goiter 02/28/2013   Headache    History of cervical dysplasia 01/18/2016   Hyperlipidemia    Left carpal tunnel syndrome 01/27/2018   Left foot pain 12/20/2019   Left hip pain 11/23/2019   Neck pain 11/22/2020   Neuropathy    Non-seasonal allergic rhinitis 07/13/2019   Pain in right ankle and joints of right foot 03/17/2019   Plantar fasciitis    Primary osteoarthritis of left hip 09/12/2020   Right leg numbness 11/22/2020   Spinal stenosis of cervical region 07/29/2018   Substance abuse (HCC)    10 years ago ( cocaine)   TIA (transient ischemic attack) 08/10/2016   Tobacco use disorder 04/17/2015   Trochanteric bursitis of left hip 11/23/2019   Urinary frequency 11/30/2019   UTI (urinary tract infection) 06/19/2012   Viral URI with cough 02/28/2013   Past Surgical History:  Procedure Laterality Date   ANKLE SURGERY     BACK SURGERY     Bowel obstruction     CARPAL TUNNEL RELEASE     CERVICAL CONIZATION W/BX N/A 12/27/2015   Procedure: CONIZATION CERVIX WITH BIOPSY;  Surgeon: Curlee VEAR Guan, MD;  Location: [facility];  Service: Gynecology;  Laterality: N/A;   DILATATION & CURETTAGE/HYSTEROSCOPY WITH  MYOSURE N/A 12/27/2015   Procedure: DILATATION & CURETTAGE/HYSTEROSCOPY WITH MYOSURE;  Surgeon: Curlee VEAR Guan, MD;  Location: [facility];  Service: Gynecology;  Laterality: N/A;   ECTOPIC PREGNANCY SURGERY     EYE SURGERY     removed right eye;    EYE SURGERY     REVERSE SHOULDER ARTHROPLASTY Left 05/11/2022   Procedure: REVERSE SHOULDER ARTHROPLASTY;  Surgeon: Kay Kemps, MD;  Location: [facility];  Service: Orthopedics;  Laterality: Left;  with ISB   TOTAL HIP ARTHROPLASTY Left 09/12/2020   Procedure: TOTAL HIP ARTHROPLASTY ANTERIOR APPROACH;  Surgeon: Melodi Lerner, MD;  Location: [facility];  Service: Orthopedics;  Laterality: Left;    TOTAL HIP ARTHROPLASTY Right 06/26/2023   Procedure: TOTAL HIP ARTHROPLASTY ANTERIOR APPROACH;  Surgeon: Melodi Lerner, MD;  Location: [facility];  Service: Orthopedics;  Laterality: Right;   Patient Active Problem List   Diagnosis Date Noted   Stress incontinence of urine 09/20/2023   OA (osteoarthritis) of hip 06/26/2023   Osteoarthritis of right hip 06/26/2023   Leg cramps 02/06/2023   Abdominal bloating 02/06/2023   Need for shingles vaccine 11/14/2022   Screening for lung cancer 11/14/2022   Cough 11/07/2022   H/O total shoulder replacement, 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   Diverticulosis 07/06/2021   Morbid obesity (HCC) 04/27/2021   BMI 40.0-44.9, adult (HCC) 04/27/2021   Peripheral polyneuropathy 05/27/2020   Neuropathy 02/15/2020   Bilateral primary osteoarthritis of hip 02/10/2020   Foot pain, left 12/20/2019  Hyperlipidemia associated with type 2 diabetes mellitus (HCC) 11/14/2017   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  ONSET DATE: ***  SUBJECTIVE:                                                                                                                                                                                           SUBJECTIVE STATEMENT: ***  PERTINENT HISTORY:  Aquatic therapy for LBP Fibromyalgia 1. Right knee osteoarthritis, tricompartmental  2. Left knee osteoarthritis, tricompartmental  3. Obesity  4. Right lower extremity radiculopathy  5. Diabetes mellitus, last A1c 7  Bilater THR Left TSR Neuropathy Prosthetic R eye   PAIN:  Are you having pain? Yes: NPRS scale: *** Pain location: *** Pain description: *** Aggravating factors: *** Relieving factors: ***  PRECAUTIONS: Prosthetic R eye   RED FLAGS: None   WEIGHT BEARING RESTRICTIONS: No  FALLS:  Has patient fallen in last 6 months? {fallsyesno:27318}  LIVING ENVIRONMENT: Lives with: {[facility] lives with:25569::lives with their family} Lives in: {Lives in:25570} Stairs: {opstairs:27293} Has following equipment at home: {Assistive devices:23999}  OCCUPATION: ***  PLOF: {PLOF:24004}  PATIENT GOALS: ***  NEXT MD VISIT: 3 months  OBJECTIVE:  Note: Objective measures were completed at Evaluation unless otherwise noted.  DIAGNOSTIC FINDINGS:  Cervical MRI (10/29/2022): Cervical kyphosis centered at C5 with severe spondylosis multiple levels multilevel central canal stenosis, most severe at C5-6. Degenerative facet changes at multiple levels most severe on the right at C2-3, C3-4, and C4-5 multilevel foraminal stenosis severe right C3-4, severe right C4-5, severe left C5-6, and moderate to severe bilateral C7-T1 foraminal narrowing.   Lumbar spine MRI not available in chart  PATIENT SURVEYS:  ODI  COGNITION: Overall cognitive status: {cognition:24006}     SENSATION: {sensation:27233}  MUSCLE  LENGTH: Hamstrings: Right *** deg; Left *** deg   POSTURE: {posture:25561}  PALPATION: ***  LUMBAR ROM:   AROM eval  Flexion   Extension   Right lateral flexion   Left lateral flexion   Right rotation   Left rotation    (Blank rows = not tested)  LOWER EXTREMITY ROM:     Active  Right eval Left eval  Hip flexion    Hip  extension    Hip abduction    Hip adduction    Hip internal rotation    Hip external rotation    Knee flexion    Knee extension    Ankle dorsiflexion    Ankle plantarflexion    Ankle inversion    Ankle eversion     (Blank rows = not tested)  LOWER EXTREMITY MMT:    MMT Right eval Left eval  Hip flexion    Hip extension    Hip abduction    Hip adduction    Hip internal rotation    Hip external rotation    Knee flexion    Knee extension    Ankle dorsiflexion    Ankle plantarflexion    Ankle inversion    Ankle eversion     (Blank rows = not tested)  LUMBAR SPECIAL TESTS:  Slump test: {pos/neg:25243}  FUNCTIONAL TESTS:  Timed up and go (TUG): ***    Item Test date: 06/15/24 Date:  Date:   Sitting to standing {berg sit to stand:32780} Insert SmartPhrase OPRCBERGREEVAL Insert SmartPhrase OPRCBERGREEVAL  2. Standing unsupported {berg stand unsupported:32781}    3. Sitting with back unsupported, feet supported {berg sit unsupported:32782}    4. Standing to sitting {berg stand to sit:32783}    5. Pivot transfer  {berg transfers:32784}    6. Standing unsupported with eyes closed {berg stand unsupported EC:32785}    7. Standing unsupported with feet together {berg stand unsupported feet together:32786}    8. Reaching forward with outstretched arms while standing {berg reaching:32787}    9. Pick up object from the floor from standing {berg object from floor:32788}    10. Turning to look behind over left and right shoulders while standing {bern turn to look:32789}    11. Turn 360 degrees {berg turn 360:32790}    12. Place alternate foot on  step or stool while standing unsupported {berg alternating foot:32791}    13. Standing unsupported one foot in front {berg tandem:32792}    14. Standing on one leg {berg SLS:32793}     Total Score ***/56 Total Score:    Total Score:     GAIT: Distance walked: *** Assistive device utilized: {Assistive devices:23999} Level of assistance: {Levels of assistance:24026} Comments: ***  TREATMENT  Eval Self care:Posture and optometrist instruction                                                                                                                                 PATIENT EDUCATION:  Education details: Objective findings, POC, rationale  Person educated: Patient Education method: Explanation Education comprehension: verbalized understanding  HOME EXERCISE PROGRAM: Aquatic TBA  ASSESSMENT:  CLINICAL IMPRESSION: Patient is a 64 y.o. f who was seen today for physical therapy evaluation and treatment for LBP. Pt has multifactorial pain. She is on chronic opioid management. She has surgical and pain hx of shoulders, hips and lumbar spine with radiculopathy into RLE.  OBJECTIVE IMPAIRMENTS: {opptimpairments:25111}.   ACTIVITY LIMITATIONS: {activitylimitations:27494}  PARTICIPATION LIMITATIONS: {participationrestrictions:25113}  PERSONAL FACTORS: {Personal factors:25162} are also affecting patient's functional outcome.   REHAB POTENTIAL: {rehabpotential:25112}  CLINICAL DECISION MAKING: {clinical decision making:25114}  EVALUATION COMPLEXITY: {Evaluation complexity:25115}   GOALS: Goals reviewed with patient? {yes/no:20286}  SHORT TERM GOALS: Target date: ***  *** Baseline: Goal status: INITIAL  2.  *** Baseline:  Goal status: INITIAL  3.  *** Baseline:  Goal status: INITIAL  4.  *** Baseline:  Goal status: INITIAL  5.  *** Baseline:  Goal status: INITIAL  6.  *** Baseline:  Goal status: INITIAL  LONG TERM GOALS: Target date:  ***  *** Baseline:  Goal status: INITIAL  2.  *** Baseline:  Goal status: INITIAL  3.  *** Baseline:  Goal status: INITIAL  4.  *** Baseline:  Goal status: INITIAL  5.  *** Baseline:  Goal status: INITIAL  6.  *** Baseline:  Goal status: INITIAL  PLAN:  PT FREQUENCY: {rehab frequency:25116}  PT DURATION: {rehab duration:25117}  PLANNED INTERVENTIONS: 97164- PT Re-evaluation, 97750- Physical Performance Testing, 97110-Therapeutic exercises, 97530- Therapeutic activity, 97112- Neuromuscular re-education, 97535- Self Care, 02859- Manual therapy, [id number]- Gait training, [id number]- Orthotic Initial, [id number]- Aquatic Therapy, [phone]- Ionotophoresis 4mg /ml Dexamethasone , 79439 (1-2 muscles), 20561 (3+ muscles)- Dry Needling, Patient/Family education, Balance training, Stair training, Taping, Joint mobilization, DME instructions, Cryotherapy, and Moist heat.  PLAN FOR NEXT SESSION: PIERRETTE Shuck Bude) Fredrik Mogel MPT 06/14/2024 8:23 PM [facility] [address] [place], [place], [address] Phone: [phone]   Fax:  [phone]   Date of referral: 04/21/24 Referring provider: Laqueta Ozell BIRCH, MD  Referring diagnosis? G89.29 (ICD-10-CM) - Other chronic pain  Treatment diagnosis? (if different than referring diagnosis) ***  What was this (referring dx) caused by? {Cause of Referring Diagnosis:30897}  Lysle of Condition: {Nature of Condition:30889}   Laterality: {Laterality:30890}  Current Functional Measure Score: {Functional Measure Score:30891}  Objective measurements identify impairments when they are compared to normal values, the uninvolved extremity, and prior level of function.  []  Yes  []  No  Objective assessment of functional ability: {assessment of functional ability:30892}   Briefly describe symptoms: ***  How did symptoms start: ***  Average pain intensity:  Last 24 hours: ***  Past week: ***  How often  does the pt experience symptoms? {Frequency of symptoms:30893}  How much have the symptoms interfered with usual daily activities? {Interfere with JIOd:69105}  How has condition changed since care began at this facility? {Condition Changed:30895}  In general, how is the patients overall health? {Overall Health:30896}   BACK PAIN (STarT Back Screening Tool) {BACK EJPW:68846}   "

## 2024-06-15 ENCOUNTER — Ambulatory Visit (HOSPITAL_BASED_OUTPATIENT_CLINIC_OR_DEPARTMENT_OTHER): Payer: Self-pay | Admitting: Physical Therapy

## 2024-06-15 NOTE — Therapy (Unsigned)
 " OUTPATIENT PHYSICAL THERAPY THORACOLUMBAR EVALUATION   Patient Name: Alyssa Montgomery MRN: [id number] DOB:1960-10-28, 64 y.o., female Today's Date: 06/15/2024  END OF SESSION:   Past Medical History:  Diagnosis Date   Anxiety    Bilateral carpal tunnel syndrome 03/03/2018   Bronchitis    Cervical spondylosis without myelopathy 07/29/2018   Cervicalgia 11/16/2014   Cervicogenic headache 07/29/2018   Chronic bilateral low back pain with bilateral sciatica 09/18/2018   Claudication 09/03/2016   Costochondritis 02/08/2022   Diabetes mellitus without complication (HCC)    Diverticulitis    FIBROIDS, UTERUS 03/09/2010   Qualifier: Diagnosis of  By: Avram MD, NOLIA Pitts E    Fibromyalgia 06/18/2014   Gait abnormality 11/22/2020   GERD (gastroesophageal reflux disease)    Goiter 02/28/2013   Headache    History of cervical dysplasia 01/18/2016   Hyperlipidemia    Left carpal tunnel syndrome 01/27/2018   Left foot pain 12/20/2019   Left hip pain 11/23/2019   Neck pain 11/22/2020   Neuropathy    Non-seasonal allergic rhinitis 07/13/2019   Pain in right ankle and joints of right foot 03/17/2019   Plantar fasciitis    Primary osteoarthritis of left hip 09/12/2020   Right leg numbness 11/22/2020   Spinal stenosis of cervical region 07/29/2018   Substance abuse (HCC)    10 years ago ( cocaine)   TIA (transient ischemic attack) 08/10/2016   Tobacco use disorder 04/17/2015   Trochanteric bursitis of left hip 11/23/2019   Urinary frequency 11/30/2019   UTI (urinary tract infection) 06/19/2012   Viral URI with cough 02/28/2013   Past Surgical History:  Procedure Laterality Date   ANKLE SURGERY     BACK SURGERY     Bowel obstruction     CARPAL TUNNEL RELEASE     CERVICAL CONIZATION W/BX N/A 12/27/2015   Procedure: CONIZATION CERVIX WITH BIOPSY;  Surgeon: Curlee VEAR Guan, MD;  Location: [facility];  Service: Gynecology;  Laterality: N/A;   DILATATION & CURETTAGE/HYSTEROSCOPY WITH  MYOSURE N/A 12/27/2015   Procedure: DILATATION & CURETTAGE/HYSTEROSCOPY WITH MYOSURE;  Surgeon: Curlee VEAR Guan, MD;  Location: [facility];  Service: Gynecology;  Laterality: N/A;   ECTOPIC PREGNANCY SURGERY     EYE SURGERY     removed right eye;    EYE SURGERY     REVERSE SHOULDER ARTHROPLASTY Left 05/11/2022   Procedure: REVERSE SHOULDER ARTHROPLASTY;  Surgeon: Kay Kemps, MD;  Location: [facility];  Service: Orthopedics;  Laterality: Left;  with ISB   TOTAL HIP ARTHROPLASTY Left 09/12/2020   Procedure: TOTAL HIP ARTHROPLASTY ANTERIOR APPROACH;  Surgeon: Melodi Lerner, MD;  Location: [facility];  Service: Orthopedics;  Laterality: Left;    TOTAL HIP ARTHROPLASTY Right 06/26/2023   Procedure: TOTAL HIP ARTHROPLASTY ANTERIOR APPROACH;  Surgeon: Melodi Lerner, MD;  Location: [facility];  Service: Orthopedics;  Laterality: Right;   Patient Active Problem List   Diagnosis Date Noted   Stress incontinence of urine 09/20/2023   OA (osteoarthritis) of hip 06/26/2023   Osteoarthritis of right hip 06/26/2023   Leg cramps 02/06/2023   Abdominal bloating 02/06/2023   Need for shingles vaccine 11/14/2022   Screening for lung cancer 11/14/2022   Cough 11/07/2022   H/O total shoulder replacement, 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   Diverticulosis 07/06/2021   Morbid obesity (HCC) 04/27/2021   BMI 40.0-44.9, adult (HCC) 04/27/2021   Peripheral polyneuropathy 05/27/2020   Neuropathy 02/15/2020   Bilateral primary osteoarthritis of hip 02/10/2020   Foot pain, left 12/20/2019  Hyperlipidemia associated with type 2 diabetes mellitus (HCC) 11/14/2017   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  ONSET DATE: ***  SUBJECTIVE:                                                                                                                                                                                           SUBJECTIVE STATEMENT: ***  PERTINENT HISTORY:  Aquatic therapy for LBP Fibromyalgia 1. Right knee osteoarthritis, tricompartmental  2. Left knee osteoarthritis, tricompartmental  3. Obesity  4. Right lower extremity radiculopathy  5. Diabetes mellitus, last A1c 7  Bilater THR Left TSR Neuropathy Prosthetic R eye   PAIN:  Are you having pain? Yes: NPRS scale: *** Pain location: *** Pain description: *** Aggravating factors: *** Relieving factors: ***  PRECAUTIONS: Prosthetic R eye   RED FLAGS: None   WEIGHT BEARING RESTRICTIONS: No  FALLS:  Has patient fallen in last 6 months? {fallsyesno:27318}  LIVING ENVIRONMENT: Lives with: {[facility] lives with:25569::lives with their family} Lives in: {Lives in:25570} Stairs: {opstairs:27293} Has following equipment at home: {Assistive devices:23999}  OCCUPATION: ***  PLOF: {PLOF:24004}  PATIENT GOALS: ***  NEXT MD VISIT: 3 months  OBJECTIVE:  Note: Objective measures were completed at Evaluation unless otherwise noted.  DIAGNOSTIC FINDINGS:  Cervical MRI (10/29/2022): Cervical kyphosis centered at C5 with severe spondylosis multiple levels multilevel central canal stenosis, most severe at C5-6. Degenerative facet changes at multiple levels most severe on the right at C2-3, C3-4, and C4-5 multilevel foraminal stenosis severe right C3-4, severe right C4-5, severe left C5-6, and moderate to severe bilateral C7-T1 foraminal narrowing.   Lumbar spine MRI not available in chart  PATIENT SURVEYS:  ODI  COGNITION: Overall cognitive status: {cognition:24006}     SENSATION: {sensation:27233}  MUSCLE  LENGTH: Hamstrings: Right *** deg; Left *** deg   POSTURE: {posture:25561}  PALPATION: ***  LUMBAR ROM:   AROM eval  Flexion   Extension   Right lateral flexion   Left lateral flexion   Right rotation   Left rotation    (Blank rows = not tested)  LOWER EXTREMITY ROM:     Active  Right eval Left eval  Hip flexion    Hip  extension    Hip abduction    Hip adduction    Hip internal rotation    Hip external rotation    Knee flexion    Knee extension    Ankle dorsiflexion    Ankle plantarflexion    Ankle inversion    Ankle eversion     (Blank rows = not tested)  LOWER EXTREMITY MMT:    MMT Right eval Left eval  Hip flexion    Hip extension    Hip abduction    Hip adduction    Hip internal rotation    Hip external rotation    Knee flexion    Knee extension    Ankle dorsiflexion    Ankle plantarflexion    Ankle inversion    Ankle eversion     (Blank rows = not tested)  LUMBAR SPECIAL TESTS:  Slump test: {pos/neg:25243}  FUNCTIONAL TESTS:  Timed up and go (TUG): ***    Item Test date: 06/15/24 Date:  Date:   Sitting to standing {berg sit to stand:32780} Insert SmartPhrase OPRCBERGREEVAL Insert SmartPhrase OPRCBERGREEVAL  2. Standing unsupported {berg stand unsupported:32781}    3. Sitting with back unsupported, feet supported {berg sit unsupported:32782}    4. Standing to sitting {berg stand to sit:32783}    5. Pivot transfer  {berg transfers:32784}    6. Standing unsupported with eyes closed {berg stand unsupported EC:32785}    7. Standing unsupported with feet together {berg stand unsupported feet together:32786}    8. Reaching forward with outstretched arms while standing {berg reaching:32787}    9. Pick up object from the floor from standing {berg object from floor:32788}    10. Turning to look behind over left and right shoulders while standing {bern turn to look:32789}    11. Turn 360 degrees {berg turn 360:32790}    12. Place alternate foot on  step or stool while standing unsupported {berg alternating foot:32791}    13. Standing unsupported one foot in front {berg tandem:32792}    14. Standing on one leg {berg SLS:32793}     Total Score ***/56 Total Score:    Total Score:     GAIT: Distance walked: *** Assistive device utilized: {Assistive devices:23999} Level of assistance: {Levels of assistance:24026} Comments: ***  TREATMENT  Eval Self care:Posture and optometrist instruction                                                                                                                                 PATIENT EDUCATION:  Education details: Objective findings, POC, rationale  Person educated: Patient Education method: Explanation Education comprehension: verbalized understanding  HOME EXERCISE PROGRAM: Aquatic TBA  ASSESSMENT:  CLINICAL IMPRESSION: Patient is a 64 y.o. f who was seen today for physical therapy evaluation and treatment for LBP. Pt has multifactorial pain. She is on chronic opioid management. She has surgical and pain hx of shoulders, hips and lumbar spine with radiculopathy into RLE.  OBJECTIVE IMPAIRMENTS: {opptimpairments:25111}.   ACTIVITY LIMITATIONS: {activitylimitations:27494}  PARTICIPATION LIMITATIONS: {participationrestrictions:25113}  PERSONAL FACTORS: {Personal factors:25162} are also affecting patient's functional outcome.   REHAB POTENTIAL: {rehabpotential:25112}  CLINICAL DECISION MAKING: {clinical decision making:25114}  EVALUATION COMPLEXITY: {Evaluation complexity:25115}   GOALS: Goals reviewed with patient? {yes/no:20286}  SHORT TERM GOALS: Target date: ***  *** Baseline: Goal status: INITIAL  2.  *** Baseline:  Goal status: INITIAL  3.  *** Baseline:  Goal status: INITIAL  4.  *** Baseline:  Goal status: INITIAL  5.  *** Baseline:  Goal status: INITIAL  6.  *** Baseline:  Goal status: INITIAL  LONG TERM GOALS: Target date:  ***  *** Baseline:  Goal status: INITIAL  2.  *** Baseline:  Goal status: INITIAL  3.  *** Baseline:  Goal status: INITIAL  4.  *** Baseline:  Goal status: INITIAL  5.  *** Baseline:  Goal status: INITIAL  6.  *** Baseline:  Goal status: INITIAL  PLAN:  PT FREQUENCY: {rehab frequency:25116}  PT DURATION: {rehab duration:25117}  PLANNED INTERVENTIONS: 97164- PT Re-evaluation, 97750- Physical Performance Testing, 97110-Therapeutic exercises, 97530- Therapeutic activity, 97112- Neuromuscular re-education, 97535- Self Care, 02859- Manual therapy, [id number]- Gait training, [id number]- Orthotic Initial, [id number]- Aquatic Therapy, [phone]- Ionotophoresis 4mg /ml Dexamethasone , 79439 (1-2 muscles), 20561 (3+ muscles)- Dry Needling, Patient/Family education, Balance training, Stair training, Taping, Joint mobilization, DME instructions, Cryotherapy, and Moist heat.  PLAN FOR NEXT SESSION: PIERRETTE Shuck [place]) Marsean Elkhatib MPT 06/15/2024 12:02 PM [facility] [address] [place], [place], [address] Phone: [phone]   Fax:  [phone]   Date of referral: 04/21/24 Referring provider: Laqueta Ozell BIRCH, MD  Referring diagnosis? G89.29 (ICD-10-CM) - Other chronic pain  Treatment diagnosis? (if different than referring diagnosis) ***  What was this (referring dx) caused by? {Cause of Referring Diagnosis:30897}  Lysle of Condition: {Nature of Condition:30889}   Laterality: {Laterality:30890}  Current Functional Measure Score: {Functional Measure Score:30891}  Objective measurements identify impairments when they are compared to normal values, the uninvolved extremity, and prior level of function.  []  Yes  []  No  Objective assessment of functional ability: {assessment of functional ability:30892}   Briefly describe symptoms: ***  How did symptoms start: ***  Average pain intensity:  Last 24 hours: ***  Past week: ***  How often  does the pt experience symptoms? {Frequency of symptoms:30893}  How much have the symptoms interfered with usual daily activities? {Interfere with JIOd:69105}  How has condition changed since care began at this facility? {Condition Changed:30895}  In general, how is the patients overall health? {Overall Health:30896}   BACK PAIN (STarT Back Screening Tool) {BACK EJPW:68846}   "

## 2024-06-22 ENCOUNTER — Ambulatory Visit (HOSPITAL_BASED_OUTPATIENT_CLINIC_OR_DEPARTMENT_OTHER): Payer: Self-pay | Admitting: Physical Therapy

## 2024-06-25 ENCOUNTER — Ambulatory Visit: Admitting: Family Medicine

## 2024-06-25 VITALS — BP 125/75 | HR 68 | Ht 64.0 in | Wt 230.8 lb

## 2024-06-25 DIAGNOSIS — E669 Obesity, unspecified: Secondary | ICD-10-CM | POA: Diagnosis not present

## 2024-06-25 DIAGNOSIS — N390 Urinary tract infection, site not specified: Secondary | ICD-10-CM | POA: Diagnosis not present

## 2024-06-25 DIAGNOSIS — N952 Postmenopausal atrophic vaginitis: Secondary | ICD-10-CM

## 2024-06-25 DIAGNOSIS — E119 Type 2 diabetes mellitus without complications: Secondary | ICD-10-CM | POA: Diagnosis not present

## 2024-06-25 LAB — POCT GLYCOSYLATED HEMOGLOBIN (HGB A1C): HbA1c, POC (controlled diabetic range): 6.8 % (ref 0.0–7.0)

## 2024-06-25 LAB — POCT URINALYSIS DIP (MANUAL ENTRY)
Bilirubin, UA: NEGATIVE
Blood, UA: NEGATIVE
Glucose, UA: NEGATIVE mg/dL
Ketones, POC UA: NEGATIVE mg/dL
Nitrite, UA: NEGATIVE
Protein Ur, POC: NEGATIVE mg/dL
Spec Grav, UA: 1.025
Urobilinogen, UA: 0.2 U/dL
pH, UA: 5.5

## 2024-06-25 LAB — POCT UA - MICROSCOPIC ONLY
RBC, Urine, Miroscopic: NONE SEEN
WBC, Ur, HPF, POC: >20

## 2024-06-25 MED ORDER — MOUNJARO 2.5 MG/0.5ML ~~LOC~~ SOAJ: 2.5000 mg | SUBCUTANEOUS | 2 refills | Status: AC

## 2024-06-25 NOTE — Assessment & Plan Note (Signed)
 A1c at goal. Cont metformin  and mounjaro .

## 2024-06-25 NOTE — Patient Instructions (Signed)
 1) I am sending you to see a gynecologist to discuss vaginal estrogen therapy. Your UTI risk and urinary incontinence is due to weakening of the vaginal tissue after menopause. Vaginal estrogen can help with vaginal thinness and have less side effects than oral estrogen. - They will call you in the next few weeks to schedule  2) Your A1c is 6.8. This is good

## 2024-06-25 NOTE — Progress Notes (Signed)
" ° ° °  SUBJECTIVE:   CHIEF COMPLAINT / HPI:   Discussed the use of AI scribe software for clinical note transcription with the patient, who gave verbal consent to proceed.  History of Present Illness FATMA RUTTEN is a 64 year old female with recurrent urinary tract infections who presents with urinary incontinence and vaginal dryness.  Recurrent urinary tract infections - Frequent UTIs since 2025, with episodes in December, October, and prior to that - Denies dysuria today or urinary frequency - Previously experienced UTIs only rarely (about one per year) - No known history of kidney or bladder disease   Urinary and Stress incontinence - Urge incontinence with sudden, strong urge to urinate and occasional accidents if unable to reach the bathroom in time - Wears pads for management - Stress incontinence with urine leakage during laughing, sneezing, or coughing  Vaginal dryness and dyspareunia - Vaginal dryness and irritation - Painful intercourse without bleeding - Perception of thinner vaginal tissue - Avoids intercourse due to pain - Use of oils for lubrication, which may have contributed to irritation and possibly UTIs  Glycemic control - Diabetes managed with metformin  - Awaiting current A1C results - Last A1C was 6.9, improved from previous values in the 7s  Gynecologic and obstetric history - Two living children, one ectopic pregnancy, one stillbirth, one abortion - Surgical history of ectopic pregnancy resulting in loss of one fallopian tube and one ovary - All deliveries were vaginal - Menopause at age 52, with symptoms persisting until age 34  Tobacco use - Quit smoking in 2020  Thromboembolic symptoms - No personal history of blood clots - Recent leg pain with negative scan for blood clots - Mother had a history of blood clot    PERTINENT  PMH / PSH: T2dM  OBJECTIVE:   BP 125/75   Pulse 68   Ht 5' 4 (1.626 m)   Wt 230 lb 12.8 oz (104.7 kg)   SpO2 98%    BMI 39.62 kg/m   General: Alert, pleasant woamn. NAD. HEENT: NCAT. MMM. CV: RRR, no murmurs.  Resp: CTAB, no wheezing or crackles. Normal WOB on RA.   Ext: Moves all ext spontaneously Skin: Warm, well perfused   ASSESSMENT/PLAN:   Assessment & Plan Vaginal atrophy Suspect frequent UTI, stress and urger incontinence, and pain w/ sex are all related to vaginal atrophy from menopause. Use of oils intravaginally could also have increased risk of UTI. A1c at goal, less likely diabetes related. Discussed options such as vaginal vs systemic estrogen, pt desires OBGYN referral to further disucss these options - OBGYN referral placed Diabetes mellitus without complication (HCC) A1c at goal. Cont metformin  and mounjaro .  Recurrent UTI  Obesity (BMI 30-39.9)      Twyla Nearing, MD [facility] "

## 2024-06-25 NOTE — Assessment & Plan Note (Signed)
 Suspect frequent UTI, stress and urger incontinence, and pain w/ sex are all related to vaginal atrophy from menopause. Use of oils intravaginally could also have increased risk of UTI. A1c at goal, less likely diabetes related. Discussed options such as vaginal vs systemic estrogen, pt desires OBGYN referral to further disucss these options - OBGYN referral placed

## 2024-06-29 ENCOUNTER — Ambulatory Visit (HOSPITAL_BASED_OUTPATIENT_CLINIC_OR_DEPARTMENT_OTHER): Payer: Self-pay | Admitting: Physical Therapy

## 2024-06-29 ENCOUNTER — Ambulatory Visit: Payer: Self-pay | Admitting: Family Medicine

## 2024-07-13 ENCOUNTER — Ambulatory Visit: Payer: Self-pay | Admitting: Family Medicine

## 2024-07-27 ENCOUNTER — Ambulatory Visit: Admitting: Family Medicine

## 2024-08-17 ENCOUNTER — Ambulatory Visit (HOSPITAL_BASED_OUTPATIENT_CLINIC_OR_DEPARTMENT_OTHER): Admitting: Physical Therapy

## 2024-08-24 ENCOUNTER — Ambulatory Visit (HOSPITAL_BASED_OUTPATIENT_CLINIC_OR_DEPARTMENT_OTHER): Payer: Self-pay | Admitting: Physical Therapy

## 2024-08-31 ENCOUNTER — Ambulatory Visit (HOSPITAL_BASED_OUTPATIENT_CLINIC_OR_DEPARTMENT_OTHER): Payer: Self-pay | Admitting: Physical Therapy

## 2024-09-07 ENCOUNTER — Ambulatory Visit (HOSPITAL_BASED_OUTPATIENT_CLINIC_OR_DEPARTMENT_OTHER): Payer: Self-pay | Admitting: Physical Therapy
